# Patient Record
Sex: Male | Born: 1950
Health system: Southern US, Community
[De-identification: ages and names within clinical notes are randomized; demographics above are authoritative.]

## PROBLEM LIST (undated history)

## (undated) DIAGNOSIS — M1710 Unilateral primary osteoarthritis, unspecified knee: Secondary | ICD-10-CM

## (undated) DIAGNOSIS — M797 Fibromyalgia: Secondary | ICD-10-CM

## (undated) DIAGNOSIS — E119 Type 2 diabetes mellitus without complications: Secondary | ICD-10-CM

## (undated) DIAGNOSIS — L930 Discoid lupus erythematosus: Secondary | ICD-10-CM

## (undated) DIAGNOSIS — M1712 Unilateral primary osteoarthritis, left knee: Secondary | ICD-10-CM

## (undated) DIAGNOSIS — J309 Allergic rhinitis, unspecified: Secondary | ICD-10-CM

## (undated) DIAGNOSIS — F341 Dysthymic disorder: Secondary | ICD-10-CM

## (undated) DIAGNOSIS — M329 Systemic lupus erythematosus, unspecified: Secondary | ICD-10-CM

## (undated) DIAGNOSIS — K219 Gastro-esophageal reflux disease without esophagitis: Secondary | ICD-10-CM

## (undated) DIAGNOSIS — E559 Vitamin D deficiency, unspecified: Secondary | ICD-10-CM

## (undated) DIAGNOSIS — R7309 Other abnormal glucose: Secondary | ICD-10-CM

## (undated) DIAGNOSIS — N508 Other specified disorders of male genital organs: Secondary | ICD-10-CM

## (undated) DIAGNOSIS — F1010 Alcohol abuse, uncomplicated: Secondary | ICD-10-CM

## (undated) DIAGNOSIS — G4700 Insomnia, unspecified: Secondary | ICD-10-CM

## (undated) DIAGNOSIS — H9190 Unspecified hearing loss, unspecified ear: Secondary | ICD-10-CM

## (undated) DIAGNOSIS — I10 Essential (primary) hypertension: Secondary | ICD-10-CM

## (undated) DIAGNOSIS — N529 Male erectile dysfunction, unspecified: Secondary | ICD-10-CM

## (undated) DIAGNOSIS — IMO0002 Reserved for concepts with insufficient information to code with codable children: Secondary | ICD-10-CM

## (undated) HISTORY — DX: Insomnia, unspecified: G47.00

## (undated) HISTORY — DX: Dysthymic disorder: F34.1

## (undated) HISTORY — DX: Alcohol abuse, uncomplicated: F10.10

## (undated) HISTORY — DX: Unilateral primary osteoarthritis, left knee: M17.12

## (undated) HISTORY — DX: Fibromyalgia: M79.7

## (undated) HISTORY — PX: TOTAL SHOULDER ARTHROPLASTY: SHX126

## (undated) HISTORY — DX: Discoid lupus erythematosus: L93.0

## (undated) HISTORY — PX: CARPAL TUNNEL RELEASE: SHX101

## (undated) HISTORY — DX: Unspecified hearing loss, unspecified ear: H91.90

## (undated) HISTORY — PX: CHOLECYSTECTOMY: SHX55

## (undated) HISTORY — DX: Gastro-esophageal reflux disease without esophagitis: K21.9

## (undated) HISTORY — DX: Reserved for concepts with insufficient information to code with codable children: IMO0002

## (undated) HISTORY — DX: Other specified disorders of male genital organs: N50.8

## (undated) HISTORY — PX: KNEE ARTHROPLASTY: SHX992

## (undated) HISTORY — DX: Vitamin D deficiency, unspecified: E55.9

## (undated) HISTORY — DX: Allergic rhinitis, unspecified: J30.9

## (undated) HISTORY — DX: Unilateral primary osteoarthritis, unspecified knee: M17.10

## (undated) HISTORY — DX: Systemic lupus erythematosus, unspecified: M32.9

## (undated) HISTORY — DX: Essential (primary) hypertension: I10

## (undated) HISTORY — DX: Other abnormal glucose: R73.09

## (undated) HISTORY — DX: Male erectile dysfunction, unspecified: N52.9

---

## 1978-12-01 HISTORY — PX: GANGLION CYST EXCISION: SHX1691

## 1998-03-13 ENCOUNTER — Encounter: Admission: RE | Admit: 1998-03-13 | Discharge: 1998-03-13 | Payer: Self-pay | Admitting: Family Medicine

## 1998-05-02 ENCOUNTER — Encounter: Admission: RE | Admit: 1998-05-02 | Discharge: 1998-05-02 | Payer: Self-pay | Admitting: Family Medicine

## 1998-05-30 ENCOUNTER — Encounter: Admission: RE | Admit: 1998-05-30 | Discharge: 1998-05-30 | Payer: Self-pay | Admitting: Family Medicine

## 1998-06-25 ENCOUNTER — Encounter: Admission: RE | Admit: 1998-06-25 | Discharge: 1998-06-25 | Payer: Self-pay | Admitting: Sports Medicine

## 1998-07-18 ENCOUNTER — Encounter: Admission: RE | Admit: 1998-07-18 | Discharge: 1998-07-18 | Payer: Self-pay | Admitting: Family Medicine

## 1998-08-24 ENCOUNTER — Encounter: Admission: RE | Admit: 1998-08-24 | Discharge: 1998-08-24 | Payer: Self-pay | Admitting: Family Medicine

## 1999-03-11 ENCOUNTER — Encounter: Admission: RE | Admit: 1999-03-11 | Discharge: 1999-03-11 | Payer: Self-pay | Admitting: Family Medicine

## 1999-03-21 ENCOUNTER — Encounter: Admission: RE | Admit: 1999-03-21 | Discharge: 1999-03-21 | Payer: Self-pay | Admitting: Family Medicine

## 1999-04-12 ENCOUNTER — Encounter: Admission: RE | Admit: 1999-04-12 | Discharge: 1999-04-12 | Payer: Self-pay | Admitting: Family Medicine

## 1999-05-02 ENCOUNTER — Encounter: Admission: RE | Admit: 1999-05-02 | Discharge: 1999-05-02 | Payer: Self-pay | Admitting: Family Medicine

## 1999-08-12 ENCOUNTER — Encounter: Admission: RE | Admit: 1999-08-12 | Discharge: 1999-08-12 | Payer: Self-pay | Admitting: Family Medicine

## 2000-01-07 ENCOUNTER — Encounter: Admission: RE | Admit: 2000-01-07 | Discharge: 2000-01-07 | Payer: Self-pay | Admitting: Family Medicine

## 2000-03-31 ENCOUNTER — Encounter: Admission: RE | Admit: 2000-03-31 | Discharge: 2000-03-31 | Payer: Self-pay | Admitting: Sports Medicine

## 2000-04-14 ENCOUNTER — Encounter: Admission: RE | Admit: 2000-04-14 | Discharge: 2000-04-14 | Payer: Self-pay | Admitting: Sports Medicine

## 2000-05-11 ENCOUNTER — Ambulatory Visit (HOSPITAL_BASED_OUTPATIENT_CLINIC_OR_DEPARTMENT_OTHER): Admission: RE | Admit: 2000-05-11 | Discharge: 2000-05-11 | Payer: Self-pay | Admitting: Orthopedic Surgery

## 2000-07-01 ENCOUNTER — Ambulatory Visit (HOSPITAL_COMMUNITY): Admission: RE | Admit: 2000-07-01 | Discharge: 2000-07-01 | Payer: Self-pay | Admitting: Gastroenterology

## 2000-07-01 ENCOUNTER — Encounter (INDEPENDENT_AMBULATORY_CARE_PROVIDER_SITE_OTHER): Payer: Self-pay | Admitting: Specialist

## 2000-08-31 ENCOUNTER — Encounter: Admission: RE | Admit: 2000-08-31 | Discharge: 2000-08-31 | Payer: Self-pay | Admitting: Family Medicine

## 2001-04-23 ENCOUNTER — Encounter: Admission: RE | Admit: 2001-04-23 | Discharge: 2001-04-23 | Payer: Self-pay | Admitting: Family Medicine

## 2001-07-26 ENCOUNTER — Encounter: Admission: RE | Admit: 2001-07-26 | Discharge: 2001-07-26 | Payer: Self-pay | Admitting: Family Medicine

## 2001-07-26 ENCOUNTER — Ambulatory Visit (HOSPITAL_COMMUNITY): Admission: RE | Admit: 2001-07-26 | Discharge: 2001-07-26 | Payer: Self-pay | Admitting: Family Medicine

## 2001-08-11 ENCOUNTER — Encounter: Admission: RE | Admit: 2001-08-11 | Discharge: 2001-08-11 | Payer: Self-pay | Admitting: Family Medicine

## 2001-08-18 ENCOUNTER — Encounter: Admission: RE | Admit: 2001-08-18 | Discharge: 2001-08-18 | Payer: Self-pay | Admitting: Family Medicine

## 2001-10-25 ENCOUNTER — Encounter: Admission: RE | Admit: 2001-10-25 | Discharge: 2001-10-25 | Payer: Self-pay | Admitting: Family Medicine

## 2001-12-13 ENCOUNTER — Encounter: Admission: RE | Admit: 2001-12-13 | Discharge: 2001-12-13 | Payer: Self-pay | Admitting: Sports Medicine

## 2002-04-14 ENCOUNTER — Encounter: Admission: RE | Admit: 2002-04-14 | Discharge: 2002-04-14 | Payer: Self-pay | Admitting: Family Medicine

## 2002-04-26 ENCOUNTER — Encounter: Admission: RE | Admit: 2002-04-26 | Discharge: 2002-04-26 | Payer: Self-pay | Admitting: Family Medicine

## 2002-08-29 ENCOUNTER — Encounter: Admission: RE | Admit: 2002-08-29 | Discharge: 2002-08-29 | Payer: Self-pay | Admitting: Family Medicine

## 2002-09-26 ENCOUNTER — Encounter: Admission: RE | Admit: 2002-09-26 | Discharge: 2002-09-26 | Payer: Self-pay | Admitting: Sports Medicine

## 2003-07-19 ENCOUNTER — Encounter: Admission: RE | Admit: 2003-07-19 | Discharge: 2003-07-19 | Payer: Self-pay | Admitting: Family Medicine

## 2003-09-25 ENCOUNTER — Encounter: Admission: RE | Admit: 2003-09-25 | Discharge: 2003-09-25 | Payer: Self-pay | Admitting: Family Medicine

## 2003-10-05 ENCOUNTER — Encounter: Admission: RE | Admit: 2003-10-05 | Discharge: 2003-10-05 | Payer: Self-pay | Admitting: Sports Medicine

## 2003-10-06 ENCOUNTER — Encounter: Admission: RE | Admit: 2003-10-06 | Discharge: 2003-10-06 | Payer: Self-pay | Admitting: Sports Medicine

## 2003-10-09 ENCOUNTER — Encounter: Admission: RE | Admit: 2003-10-09 | Discharge: 2003-10-09 | Payer: Self-pay | Admitting: Family Medicine

## 2003-10-16 ENCOUNTER — Encounter: Admission: RE | Admit: 2003-10-16 | Discharge: 2003-10-16 | Payer: Self-pay | Admitting: Family Medicine

## 2003-11-01 ENCOUNTER — Encounter: Admission: RE | Admit: 2003-11-01 | Discharge: 2003-11-01 | Payer: Self-pay | Admitting: Family Medicine

## 2003-11-10 ENCOUNTER — Emergency Department (HOSPITAL_COMMUNITY): Admission: AD | Admit: 2003-11-10 | Discharge: 2003-11-10 | Payer: Self-pay | Admitting: Family Medicine

## 2003-11-22 ENCOUNTER — Encounter: Admission: RE | Admit: 2003-11-22 | Discharge: 2003-11-22 | Payer: Self-pay | Admitting: Sports Medicine

## 2003-11-27 ENCOUNTER — Encounter: Admission: RE | Admit: 2003-11-27 | Discharge: 2003-11-27 | Payer: Self-pay

## 2003-12-04 ENCOUNTER — Ambulatory Visit (HOSPITAL_COMMUNITY): Admission: RE | Admit: 2003-12-04 | Discharge: 2003-12-04 | Payer: Self-pay | Admitting: Family Medicine

## 2003-12-22 ENCOUNTER — Encounter: Admission: RE | Admit: 2003-12-22 | Discharge: 2003-12-22 | Payer: Self-pay | Admitting: Family Medicine

## 2004-01-19 ENCOUNTER — Encounter: Admission: RE | Admit: 2004-01-19 | Discharge: 2004-01-19 | Payer: Self-pay | Admitting: Family Medicine

## 2004-03-12 ENCOUNTER — Encounter: Admission: RE | Admit: 2004-03-12 | Discharge: 2004-03-12 | Payer: Self-pay | Admitting: Sports Medicine

## 2004-05-02 ENCOUNTER — Encounter: Admission: RE | Admit: 2004-05-02 | Discharge: 2004-05-02 | Payer: Self-pay | Admitting: Family Medicine

## 2004-05-21 ENCOUNTER — Encounter: Admission: RE | Admit: 2004-05-21 | Discharge: 2004-05-21 | Payer: Self-pay | Admitting: Family Medicine

## 2004-05-23 ENCOUNTER — Encounter: Admission: RE | Admit: 2004-05-23 | Discharge: 2004-05-23 | Payer: Self-pay | Admitting: Sports Medicine

## 2004-05-27 ENCOUNTER — Encounter: Admission: RE | Admit: 2004-05-27 | Discharge: 2004-05-27 | Payer: Self-pay | Admitting: Family Medicine

## 2005-08-15 ENCOUNTER — Ambulatory Visit (HOSPITAL_COMMUNITY): Admission: RE | Admit: 2005-08-15 | Discharge: 2005-08-15 | Payer: Self-pay | Admitting: Gastroenterology

## 2005-08-15 ENCOUNTER — Encounter (INDEPENDENT_AMBULATORY_CARE_PROVIDER_SITE_OTHER): Payer: Self-pay | Admitting: Specialist

## 2006-12-01 HISTORY — PX: KNEE ARTHROSCOPY: SUR90

## 2007-06-30 ENCOUNTER — Ambulatory Visit: Payer: Self-pay | Admitting: Family Medicine

## 2007-12-02 HISTORY — PX: OTHER SURGICAL HISTORY: SHX169

## 2008-02-11 ENCOUNTER — Encounter: Payer: Self-pay | Admitting: Rheumatology

## 2008-06-08 ENCOUNTER — Encounter: Payer: Self-pay | Admitting: Family Medicine

## 2008-06-08 ENCOUNTER — Ambulatory Visit: Payer: Self-pay | Admitting: Surgery

## 2008-06-08 ENCOUNTER — Ambulatory Visit: Admission: RE | Admit: 2008-06-08 | Discharge: 2008-06-08 | Payer: Self-pay | Admitting: Family Medicine

## 2008-10-28 ENCOUNTER — Ambulatory Visit (HOSPITAL_COMMUNITY): Admission: RE | Admit: 2008-10-28 | Discharge: 2008-10-28 | Payer: Self-pay | Admitting: Family Medicine

## 2008-10-28 ENCOUNTER — Ambulatory Visit: Payer: Self-pay | Admitting: *Deleted

## 2008-10-28 ENCOUNTER — Encounter: Payer: Self-pay | Admitting: Family Medicine

## 2008-10-31 HISTORY — PX: ROTATOR CUFF REPAIR: SHX139

## 2008-11-20 ENCOUNTER — Ambulatory Visit (HOSPITAL_BASED_OUTPATIENT_CLINIC_OR_DEPARTMENT_OTHER): Admission: RE | Admit: 2008-11-20 | Discharge: 2008-11-20 | Payer: Self-pay | Admitting: Orthopedic Surgery

## 2010-01-11 ENCOUNTER — Ambulatory Visit: Payer: Self-pay | Admitting: Family Medicine

## 2010-08-01 LAB — HM COLONOSCOPY: HM Colonoscopy: 3

## 2010-12-16 ENCOUNTER — Encounter
Admission: RE | Admit: 2010-12-16 | Discharge: 2010-12-16 | Payer: Self-pay | Source: Home / Self Care | Attending: Family Medicine | Admitting: Family Medicine

## 2011-04-15 NOTE — Op Note (Signed)
David Weaver, David Weaver                 ACCOUNT NO.:  [id number]   MEDICAL RECORD NO.:  [id number]          PATIENT TYPE:  AMB   LOCATION:  DSC                          FACILITY:  [facility]   PHYSICIAN:  Robert A. Thurston Hole, M.D. DATE OF BIRTH:  December 15, 1950   DATE OF PROCEDURE:  11/20/2008  DATE OF DISCHARGE:                               OPERATIVE REPORT   PREOPERATIVE DIAGNOSES:  1. Right shoulder complex rotator cuff tear.  2. Right shoulder partial labrum tear.   POSTOPERATIVE DIAGNOSES:  1. Right shoulder complex rotator cuff tear.  2. Right shoulder partial labrum tear.   PROCEDURE:  1. Right shoulder examination under anesthesia followed by      arthroscopically assisted complex rotator cuff repair using Arthrex      suture anchors x2 plus SwiveLock anchor x1 plus PushLock anchor x1.  2. Right shoulder debridement of partial labrum tear.   SURGEON:  Elana Alm. Thurston Hole, MD   ANESTHESIA:  General.   OPERATIVE TIME:  Two hours.   COMPLICATIONS:  None.   INDICATIONS FOR PROCEDURE:  David Weaver is a 60 year old gentleman who has  had significant right shoulder pain for the past 3-4 months, increasing  in nature with exam and MRI documenting a complex rotator cuff tear with  partial labrum tear.  He has failed conservative care and is now to  undergo arthroscopy and repair.   DESCRIPTION:  David Weaver was brought to the operating room on November 20, 2008, after interscalene block was placed in the holding area by  Anesthesia.  He was placed on the operative table in the supine  position.  He received Ancef 1 g IV preoperatively for prophylaxis.  After being placed under general anesthesia, his right shoulder was  examined.  He had full range of motion and his shoulder was stable to  ligamentous exam.  He was then placed in a beach-chair position and his  shoulder and arm was prepped using sterile DuraPrep and draped using  sterile technique.  Originally, through a posterior arthroscopic  portal,  the arthroscope with a pump attached was placed into an anterior portal  and an arthroscopic probe was placed.  On initial inspection, the  articular cartilage in the glenohumeral joint was found be intact.  He  had partial tearing of the anterosuperior and posterosuperior labrum 25-  30% which was debrided.  Anteroinferior labrum partial tearing 25% which  was debrided as well.  The anteroinferior glenohumeral ligament complex  was intact.  Biceps tendon anchor was hypermobile, but was otherwise  intact.  Biceps tendon was intact.  Rotator cuff showed a high-grade  partial tear of the subscapularis and an accessory lateral portal was  made and a mattress suture of 2-0 FiberWire was placed through this  subscap tear and then tied down arthroscopically and then it was further  reinforced with a PushLock anchor, just anterior to the bicipital groove  with a firm and tight fixation.  The supraspinatus and infraspinatus was  also found to be completely torn and detached and this was partially  debrided arthroscopically as well.  The  teres minor was intact.  Through  the accessory lateral portal, 2 separate Arthrex 5.5-mm suture anchors  were placed in the greater tuberosity.  Each of the sutures in this  anchor was passed arthroscopically through the rotator cuff tear and  then tied down with a firm and tight repair.  They were further  reinforced as well with a SwiveLock in the greater tuberosity with 6 of  these sutures from the repair.  Firm and tight repair was achieved.  The  shoulder could be brought through a full range of motion with no  impingement.  The patient had previously undergone a subacromial  decompression 8 years ago and did not need a revision of this as well as  a previous distal clavicle excision and this was not pathologic either.  At this point, it was felt that all pathology had been satisfactorily  addressed.  The instruments were removed.  Portals were  closed with 3-0  nylon suture.  Sterile dressings and abduction sling were applied.  The  patient was awakened and taken to the recovery room in stable condition.   FOLLOWUP CARE:  David Weaver will be followed as an outpatient on Percocet  and Robaxin.  He will be seen back in the office in a week for sutures  out and followup.      Robert A. Thurston Hole, M.D.  Electronically Signed     RAW/MEDQ  D:  11/20/2008  T:  11/21/2008  Job:  [id number]

## 2011-04-18 NOTE — Op Note (Signed)
[place]  Patient:    David Weaver, David Weaver                        MRN: [id number] Proc. Date: 05/11/00 Adm. Date:  [id number] Disc. Date: [id number] Attending:  Twana First                           Operative Report  PREOPERATIVE DIAGNOSIS:  Right shoulder rotator cuff tendonitis and impingement  with AC joint spurring.  POSTOPERATIVE DIAGNOSIS:  Right shoulder rotator cuff tendonitis and impingement with AC joint spurring.  PROCEDURE:  Right shoulder EUA followed by arthroscopic subacromial decompression. Right shoulder arthroscopically assisted distal clavicle excision.  SURGEON:  Elana Alm. Thurston Hole, M.D.  ASSISTANT:  Kirstin Adelberger, P.A.  ANESTHESIA:  General anesthesia.  OPERATIVE TIME:  40 minutes.  COMPLICATIONS:  None.  INDICATIONS:  [name] is a 60 year old gentleman who has had significant right  shoulder pain for the past seven to eight months increasing in nature with signs and symptoms consistent with rotator cuff tendonitis and impingement who has failed conservative care and is now to undergo arthroscopy.  DESCRIPTION OF PROCEDURE:  [name] was brought to the operating room on May 11, 2000, after a supraclavicular block had been placed in the holding room.  He was placed on the operative table in the supine position.  After an adequate level f general anesthesia was obtained, his right shoulder was examined.  He had a full range of motion and his shoulder was stable to ligamentous examination.  After his was done, he was placed in a beach chair position and his shoulder and arm was prepped using sterile Betadine and draped using sterile technique.  Originally,  through a posterior arthroscopic portal, the arthroscope with the pump attached was placed and through an anterior portal, an arthroscopic probe was placed.  On initial inspection, the articular cartilage and the glenohumeral joint was found to be  intact.  The anterior labrum was intact.  The superior labrum showed mild fibrillation, 20% was resected, but the biceps tendon anchor was intact.  The posterior labrum was intact.  Inferior labrum and anterior and inferior glenohumeral ligament complex was intact.  Biceps tendon was thoroughly inspected. There was no evidence of a tear.  Rotator cuff was thoroughly inspected.  There was some mild fraying, but no evidence of a tear.  Inferior capsular recess was free of pathology.  Subacromial space was entered and moderately thickened bursitis was  resected.  Underneath this, the rotator cuff was found to be moderately inflammed and thickened, but no evidence of a tear.  A subacromial decompression was carried out removing 6 to 8 mm of the undersurface of the anterior, anterior lateral, and anterior medial acromion that was resected with a 6 mm bur.  The CA ligament was also released and cauterized.  The distal clavicle AC joint was exposed. Significant degenerative changes were noted and the distal 5 to 6 mm was resected with a 6 mm bur and this was confirmed both arthroscopically and fluoroscopically. After this was done, the shoulder could be brought through a full range of motion with no impingement on the rotator cuff.  At this point, it was felt that all pathology had been satisfactorily addressed.  Instruments were removed. Portals were closed with 3-0 nylon suture and injected with 0.25% Marcaine with epinephrine.  Sterile dressings were applied and the patient awakened  and taken to the recovery room in stable condition.  FOLLOW-UP:  [name] will be followed as an outpatient on Talwin NX and Naprosyn. I will see him back in the office in a week for sutures out and follow-up. Begin early physical therapy for range of motion followed by strengthening. DD:  05/11/00 TD:  05/13/00 Job: [id number]

## 2011-04-18 NOTE — Procedures (Signed)
[facility]  Patient:    David Weaver, David Weaver                        MRN: [id number] Proc. Date: 07/01/00 Adm. Date:  [id number] Disc. Date: [id number] Attending:  Charna Elizabeth CC:         Jamey Reas, M.D.   Procedure Report  DATE OF BIRTH:  08-Oct-1951  REFERRING PHYSICIAN:  Jamey Reas, M.D.  PROCEDURE PERFORMED:  Colonoscopy with hot biopsy x 1.  ENDOSCOPIST:  Anselmo Rod, M.D.  INSTRUMENT USED:  Olympus video colonoscope.  INDICATIONS:  A 60 year old white male with a history of colon cancer in his brother who was diagnosed at 31.  Rule out colonic polyps, masses, hemorrhoids, etc.  PREPROCEDURE PREPARATION:  Informed consent was procured from the patient. The patient was fasted for eight hours prior to the procedure and prepped with a bottle of magnesium citrate and a gallon of Nulytely the night prior to the procedure.  PREPROCEDURE PHYSICAL:  VITAL SIGNS:  The patient had stable vital signs.  NECK:  Supple.  CHEST:  Chest clear to auscultation.  CARDIAC:  S1 and S2 regular.  ABDOMEN:  Abdomen soft with normal abdominal bowel sounds.  DESCRIPTION OF PROCEDURE:  The patient was placed in the left lateral decubitus position and sedated with 60 mg of Demerol and 5 mg of Versed intravenously.  Once the patient was adequately sedated and maintained on low-flow oxygen and continuous cardiac monitoring, the Olympus video colonoscope was advanced from the rectum to the cecum without difficulty. There was a small amount of residual stool in the colon.  A small sessile polyp was removed from the distal right colon.  The patient also had small nonbleeding internal hemorrhoids on retroflexion.  The rest of the colonic mucosa appeared healthy without abnormalities.  IMPRESSION: 1. Small nonbleeding internal hemorrhoid. 2. One small polyp biopsied from the distal right colon,    otherwise normal colonoscopy after the  rectum.  RECOMMENDATIONS: 1. Await pathology results. 2. Repeat colonoscopy in the next five years or earlier if need    be (if the patient develops any symptoms in the interim). DD:  07/01/00 TD:  07/02/00 Job: [id number]

## 2011-09-04 LAB — BASIC METABOLIC PANEL
BUN: 10 mg/dL (ref 6–23)
CO2: 23 mEq/L (ref 19–32)
Calcium: 8.8 mg/dL (ref 8.4–10.5)
Chloride: 102 mEq/L (ref 96–112)
Creatinine, Ser: 0.55 mg/dL (ref 0.4–1.5)
GFR calc Af Amer: >60 mL/min (ref >60)
GFR calc non Af Amer: >60 mL/min (ref >60)
Glucose, Bld: 98 mg/dL (ref 70–99)
Potassium: 4.5 mEq/L (ref 3.5–5.1)
Sodium: 135 mEq/L (ref 135–145)

## 2012-01-13 LAB — PULMONARY FUNCTION TEST

## 2012-09-01 ENCOUNTER — Other Ambulatory Visit: Payer: Self-pay

## 2012-10-26 ENCOUNTER — Encounter: Payer: Self-pay | Admitting: *Deleted

## 2012-11-01 ENCOUNTER — Ambulatory Visit: Payer: BC Managed Care – PPO

## 2012-11-01 ENCOUNTER — Ambulatory Visit (INDEPENDENT_AMBULATORY_CARE_PROVIDER_SITE_OTHER): Payer: BC Managed Care – PPO | Admitting: Family Medicine

## 2012-11-01 ENCOUNTER — Encounter: Payer: Self-pay | Admitting: Family Medicine

## 2012-11-01 VITALS — BP 118/82 | HR 102 | Temp 98.0°F | Resp 16 | Ht 66.5 in | Wt 183.0 lb

## 2012-11-01 DIAGNOSIS — E559 Vitamin D deficiency, unspecified: Secondary | ICD-10-CM

## 2012-11-01 DIAGNOSIS — Z01818 Encounter for other preprocedural examination: Secondary | ICD-10-CM

## 2012-11-01 DIAGNOSIS — G4700 Insomnia, unspecified: Secondary | ICD-10-CM | POA: Insufficient documentation

## 2012-11-01 DIAGNOSIS — R7309 Other abnormal glucose: Secondary | ICD-10-CM | POA: Insufficient documentation

## 2012-11-01 DIAGNOSIS — F329 Major depressive disorder, single episode, unspecified: Secondary | ICD-10-CM

## 2012-11-01 DIAGNOSIS — Z23 Encounter for immunization: Secondary | ICD-10-CM | POA: Insufficient documentation

## 2012-11-01 DIAGNOSIS — I10 Essential (primary) hypertension: Secondary | ICD-10-CM

## 2012-11-01 LAB — CBC WITH DIFFERENTIAL/PLATELET
Basophils Absolute: 0.1 10*3/uL (ref 0.0–0.1)
Basophils Relative: 1 % (ref 0–1)
Eosinophils Absolute: 0.2 10*3/uL (ref 0.0–0.7)
Eosinophils Relative: 4 % (ref 0–5)
HCT: 47.3 % (ref 39.0–52.0)
Hemoglobin: 16.7 g/dL (ref 13.0–17.0)
Lymphocytes Relative: 19 % (ref 12–46)
Lymphs Abs: 1 10*3/uL (ref 0.7–4.0)
MCH: 29.2 pg (ref 26.0–34.0)
MCHC: 35.3 g/dL (ref 30.0–36.0)
MCV: 82.8 fL (ref 78.0–100.0)
Monocytes Absolute: 0.8 10*3/uL (ref 0.1–1.0)
Monocytes Relative: 14 % — ABNORMAL HIGH (ref 3–12)
Neutro Abs: 3.4 10*3/uL (ref 1.7–7.7)
Neutrophils Relative %: 62 % (ref 43–77)
Platelets: 299 10*3/uL (ref 150–400)
RBC: 5.71 MIL/uL (ref 4.22–5.81)
RDW: 14.8 % (ref 11.5–15.5)
WBC: 5.5 10*3/uL (ref 4.0–10.5)

## 2012-11-01 LAB — HEMOGLOBIN A1C
Hgb A1c MFr Bld: 6.2 % — ABNORMAL HIGH (ref <5.7)
Mean Plasma Glucose: 131 mg/dL — ABNORMAL HIGH (ref <117)

## 2012-11-01 NOTE — Assessment & Plan Note (Signed)
Controlled no change in management. 

## 2012-11-01 NOTE — Assessment & Plan Note (Signed)
Improved with decreased caffeine intake in the evenings.

## 2012-11-01 NOTE — Progress Notes (Signed)
[address]   [place], [place]  [address]   [phone]  Subjective:    Patient ID: David Weaver, male    DOB: 07-22-1951, 61 y.o.   MRN: [id number]  HPIThis 61 y.o. male presents for evaluation of four month follow-up and for preoperative clearance:  1.  OA knees: scheduled for TKR R in 12/27/12.  Will eventually have L TKR.  Plans to perform L TKR four weeks later.  Will be out of work x 3 months.  No chest pain, palpitations, no SOB.  Does suffer with DOE with walking to building and back which is 50-60 yards.  Walking up steps without difficulty; no dyspnea on exertion with walking up steps.  No swelling in legs.  No excessive fatigue.  No orthopnea.  Not smoking.  Drinking 4-5 nights per week.  2.  Insomnia: quit drinking tea at night.  Going to sleep at 11:00pm; wakes up at 7:30am.  No sleep aides.  Tried Melatonin but caused night sweats.  Intolerant to Trazodone.  Drinks caffeine at lunch.    3.  HTN:  Not checking blood pressure at home.  Four month follow-up.  No changes to management made at last visit.  Reports good compliance with medication; good tolerance to medication; good symptom control.    4.  Depression:  Stable.  No SI.  Retirement unknown.  No major stressors at home or at work.  Compliance with medication; good tolerance to medication; good symptom control.  5. Discoid Lupus:   Followed every six months at Dr. Nada Maclachlan office; usually sees the PA-C.  No recent issues.  6. Fibromyalgia:  Stable.  Taking one Percocet daily; waits until 10:00am.    7.  Vitamin D deficiency: taking 1000 units daily.  Also taking Vitamin D 50,000 weekly.      Review of Systems  Constitutional: Negative for fever, chills, diaphoresis and fatigue.  HENT: Negative for nosebleeds, congestion and rhinorrhea.   Respiratory: Negative for apnea, cough, choking, chest tightness, shortness of breath, wheezing and stridor.   Cardiovascular: Negative for chest pain, palpitations and leg  swelling.  Gastrointestinal: Negative for nausea, vomiting, abdominal pain, diarrhea and constipation.  Musculoskeletal: Positive for myalgias, back pain, joint swelling and arthralgias.  Neurological: Negative for dizziness, syncope, facial asymmetry, speech difficulty, weakness, light-headedness, numbness and headaches.  Psychiatric/Behavioral: Negative for suicidal ideas, sleep disturbance, self-injury and dysphoric mood. The patient is not nervous/anxious.         Past Medical History  Diagnosis Date  . Other abnormal glucose   . Allergic rhinitis, cause unspecified   . Osteoarthrosis, unspecified whether generalized or localized, lower leg   . Other specified disorder of male genital organs   . Other malaise and fatigue   . Tobacco use disorder   . Alcohol abuse, daily use   . Essential hypertension, benign   . Dysthymic disorder   . Esophageal reflux   . Impotence of organic origin   . Lupus erythematosus   . Unspecified hearing loss   . Insomnia, unspecified   . Unspecified vitamin D deficiency   . Lupus     Past Surgical History  Procedure Date  . Cholecystectomy   . Carpal tunnel release     bilateral  . Ganglion cyst excision 1980    Left wrist  . Knee arthroscopy 2008    Right  . Lumbar spine epidural 2009    [facility]  . Rotator cuff repair 10/2008    right  Prior to Admission medications   Medication Sig Start Date End Date Taking? Authorizing Provider  amLODipine (NORVASC) 10 MG tablet Take 10 mg by mouth daily.   Yes Historical Provider, MD  aspirin 81 MG chewable tablet Chew 81 mg by mouth daily.   Yes Historical Provider, MD  DULoxetine (CYMBALTA) 60 MG capsule Take 60 mg by mouth 2 (two) times daily.   Yes Historical Provider, MD  hydroxychloroquine (PLAQUENIL) 200 MG tablet Take 200 mg by mouth 2 (two) times daily.   Yes Historical Provider, MD  omeprazole (PRILOSEC) 40 MG capsule Take 40 mg by mouth daily.   Yes Historical Provider,  MD  oxyCODONE-acetaminophen (PERCOCET/ROXICET) 5-325 MG per tablet Take 1 tablet by mouth daily.   Yes Historical Provider, MD  sucralfate (CARAFATE) 1 G tablet Take 1 g by mouth 4 (four) times daily.   Yes Historical Provider, MD  tadalafil (CIALIS) 20 MG tablet Take 20 mg by mouth daily as needed.   Yes Historical Provider, MD  Vitamin D, Ergocalciferol, (DRISDOL) 50000 UNITS CAPS Take 50,000 Units by mouth every 7 (seven) days.   Yes Historical Provider, MD  pilocarpine (SALAGEN) 5 MG tablet Take 5 mg by mouth 3 (three) times daily.    Historical Provider, MD  traZODone (DESYREL) 50 MG tablet Take 50 mg by mouth at bedtime.    Historical Provider, MD    No Known Allergies  History   Social History  . Marital Status: Married    Spouse Name: N/A    Number of Children: 1  . Years of Education: N/A   Occupational History  . [employer]     [employer]s cars/trucks   Social History Main Topics  . Smoking status: Former Smoker -- 2.0 packs/day for 30 years    Types: Cigarettes  . Smokeless tobacco: Not on file     Comment: quit 12 /2011  . Alcohol Use: Yes     Comment: 2 drinks of liquor, every afternoon  . Drug Use: No  . Sexually Active: Not on file   Other Topics Concern  . Not on file   Social History Narrative   Guns in the home stored in locked cabinet. Caffeine use: 1 serving/ day. Exercise: Light due to arthritis pain, walks as much as he can tolerate. Married x 16 years, third marriage, happily married.    Family History  Problem Relation Age of Onset  . Alzheimer's disease Mother   . Osteoporosis Mother   . Heart disease Father   . Alcohol abuse Father   . Cancer Brother     colon  . Diabetes Brother     Objective:   Physical Exam  Nursing note and vitals reviewed. Constitutional: He is oriented to person, place, and time. He appears well-developed and well-nourished. No distress.  HENT:  Head: Normocephalic and atraumatic.  Mouth/Throat: Oropharynx is clear  and moist.  Eyes: Conjunctivae normal are normal. Pupils are equal, round, and reactive to light.  Neck: Normal range of motion. Neck supple. No thyromegaly present.  Cardiovascular: Normal rate, regular rhythm, normal heart sounds and intact distal pulses.  Exam reveals no gallop and no friction rub.   No murmur heard. Pulmonary/Chest: Effort normal and breath sounds normal. He has no wheezes. He has no rales.  Abdominal: Soft. Bowel sounds are normal. There is no tenderness. There is no rebound and no guarding.  Lymphadenopathy:    He has no cervical adenopathy.  Neurological: He is alert and oriented to person, place,  and time. No cranial nerve deficit. He exhibits normal muscle tone. Coordination normal.  Skin: Skin is warm and dry. No rash noted. He is not diaphoretic. No pallor.  Psychiatric: He has a normal mood and affect. His behavior is normal. Judgment and thought content normal.   INFLUENZA VACCINE ADMINISTERED.  EKG:    [facility] reading (PRIMARY) by  Dr. Katrinka Blazing.  CXR: NAD     Assessment & Plan:   1. Pre-operative clearance  EKG 12-Lead, Spirometry with graph, Spirometry with graph, DG Chest 2 View  2. Essential hypertension, benign  CBC with Differential, Comprehensive metabolic panel  3. Other abnormal glucose  Hemoglobin A1c  4. Depression    5. Need for prophylactic vaccination and inoculation against influenza  Flu vaccine greater than or equal to 3yo preservative free IM  6. Vitamin D deficiency  Vitamin D 25 hydroxy

## 2012-11-01 NOTE — Assessment & Plan Note (Signed)
New. Scheduled for R total knee replacement on 12/28/11 by Thurston Hole. Asymptomatic from cardio-respiratory standpoint.  EKG stable; CXR normal; spirometry stable.  Obtain labs.

## 2012-11-01 NOTE — Patient Instructions (Addendum)
1. Pre-operative clearance  EKG 12-Lead, Spirometry with graph, Spirometry with graph, DG Chest 2 View  2. Essential hypertension, benign  CBC with Differential, Comprehensive metabolic panel  3. Other abnormal glucose  Hemoglobin A1c  4. Depression    5. Need for prophylactic vaccination and inoculation against influenza  Flu vaccine greater than or equal to 63yo preservative free IM  6. Vitamin D deficiency  Vitamin D 25 hydroxy

## 2012-11-01 NOTE — Assessment & Plan Note (Signed)
Stable; obtain labs; continue with dietary modification, attempts at weight loss.

## 2012-11-01 NOTE — Assessment & Plan Note (Signed)
Controlled; no changes to management.  Obtain labs.

## 2012-11-01 NOTE — Assessment & Plan Note (Signed)
Uncontrolled; obtain labs; continue current dose of supplementation.

## 2012-11-01 NOTE — Assessment & Plan Note (Signed)
Administered in office.

## 2012-11-02 ENCOUNTER — Telehealth: Payer: Self-pay

## 2012-11-02 LAB — COMPREHENSIVE METABOLIC PANEL
ALT: 34 U/L (ref 0–53)
AST: 25 U/L (ref 0–37)
Albumin: 4.6 g/dL (ref 3.5–5.2)
Alkaline Phosphatase: 64 U/L (ref 39–117)
BUN: 21 mg/dL (ref 6–23)
CO2: 23 mEq/L (ref 19–32)
Calcium: 9.9 mg/dL (ref 8.4–10.5)
Chloride: 106 mEq/L (ref 96–112)
Creat: 0.78 mg/dL (ref 0.50–1.35)
Glucose, Bld: 104 mg/dL — ABNORMAL HIGH (ref 70–99)
Potassium: 4 mEq/L (ref 3.5–5.3)
Sodium: 139 mEq/L (ref 135–145)
Total Bilirubin: 0.7 mg/dL (ref 0.3–1.2)
Total Protein: 7.1 g/dL (ref 6.0–8.3)

## 2012-11-02 LAB — VITAMIN D 25 HYDROXY (VIT D DEFICIENCY, FRACTURES): Vit D, 25-Hydroxy: 40 ng/mL (ref 30–89)

## 2012-11-02 NOTE — Telephone Encounter (Signed)
See labs 

## 2012-11-02 NOTE — Telephone Encounter (Signed)
Pt states he had a missed call from our office, wants to know if its regarding his labs Please call pt to advsie

## 2012-12-13 ENCOUNTER — Encounter (HOSPITAL_COMMUNITY): Payer: Self-pay | Admitting: Respiratory Therapy

## 2012-12-15 ENCOUNTER — Other Ambulatory Visit: Payer: Self-pay | Admitting: Physician Assistant

## 2012-12-15 ENCOUNTER — Encounter: Payer: Self-pay | Admitting: Physician Assistant

## 2012-12-15 DIAGNOSIS — M19041 Primary osteoarthritis, right hand: Secondary | ICD-10-CM | POA: Insufficient documentation

## 2012-12-15 DIAGNOSIS — M1711 Unilateral primary osteoarthritis, right knee: Secondary | ICD-10-CM | POA: Insufficient documentation

## 2012-12-15 DIAGNOSIS — G4700 Insomnia, unspecified: Secondary | ICD-10-CM | POA: Insufficient documentation

## 2012-12-15 DIAGNOSIS — K219 Gastro-esophageal reflux disease without esophagitis: Secondary | ICD-10-CM | POA: Insufficient documentation

## 2012-12-15 DIAGNOSIS — H9190 Unspecified hearing loss, unspecified ear: Secondary | ICD-10-CM | POA: Insufficient documentation

## 2012-12-15 DIAGNOSIS — E559 Vitamin D deficiency, unspecified: Secondary | ICD-10-CM | POA: Insufficient documentation

## 2012-12-15 DIAGNOSIS — F1010 Alcohol abuse, uncomplicated: Secondary | ICD-10-CM | POA: Insufficient documentation

## 2012-12-15 DIAGNOSIS — M359 Systemic involvement of connective tissue, unspecified: Secondary | ICD-10-CM | POA: Insufficient documentation

## 2012-12-15 DIAGNOSIS — L930 Discoid lupus erythematosus: Secondary | ICD-10-CM

## 2012-12-15 DIAGNOSIS — M1710 Unilateral primary osteoarthritis, unspecified knee: Secondary | ICD-10-CM

## 2012-12-15 DIAGNOSIS — F17200 Nicotine dependence, unspecified, uncomplicated: Secondary | ICD-10-CM | POA: Insufficient documentation

## 2012-12-15 NOTE — H&P (Signed)
TOTAL KNEE ADMISSION H&P  Patient is being admitted for right total knee arthroplasty.  Subjective:  Chief Complaint:right knee pain.  HPI: David Weaver, 61 y.o. male, has a history of pain and functional disability in the right knee due to arthritis and has failed non-surgical conservative treatments for greater than 12 weeks to includeNSAID's and/or analgesics, corticosteriod injections, flexibility and strengthening excercises, supervised PT with diminished ADL's post treatment, use of assistive devices, weight reduction as appropriate and activity modification.  Onset of symptoms was gradual, starting 5 years ago with gradually worsening course since that time. The patient noted prior procedures on the knee to include  arthroscopy and menisectomy on the right knee(s).  Patient currently rates pain in the right knee(s) at 8 out of 10 with activity. Patient has night pain, worsening of pain with activity and weight bearing, pain that interferes with activities of daily living, crepitus and joint swelling.  Patient has evidence of subchondral sclerosis, periarticular osteophytes and joint space narrowing by imaging studies. There is no active infection.  Patient Active Problem List   Diagnosis Date Noted  . Osteoarthrosis, unspecified whether generalized or localized, lower leg   . Tobacco use disorder   . Alcohol abuse, daily use   . Esophageal reflux   . Lupus erythematosus   . Unspecified hearing loss   . Insomnia, unspecified   . Right knee DJD   . Unspecified vitamin D deficiency   . Essential hypertension, benign 11/01/2012  . Other abnormal glucose 11/01/2012  . Pre-operative clearance 11/01/2012  . Depression 11/01/2012  . Need for prophylactic vaccination and inoculation against influenza 11/01/2012  . Vitamin D deficiency 11/01/2012  . Insomnia 11/01/2012   Past Medical History  Diagnosis Date  . Other abnormal glucose   . Allergic rhinitis, cause unspecified   .  Osteoarthrosis, unspecified whether generalized or localized, lower leg   . Other specified disorder of male genital organs   . Other malaise and fatigue   . Tobacco use disorder     quit smokeing 2012  . Alcohol abuse, daily use   . Essential hypertension, benign   . Dysthymic disorder   . Esophageal reflux   . Impotence of organic origin   . Lupus erythematosus   . Unspecified hearing loss   . Insomnia, unspecified   . Unspecified vitamin D deficiency   . Lupus   . Right knee DJD     Past Surgical History  Procedure Date  . Cholecystectomy   . Carpal tunnel release     bilateral  . Ganglion cyst excision 1980    Left wrist  . Knee arthroscopy 2008    Right  . Lumbar spine epidural 2009    [facility]  . Rotator cuff repair 10/2008    right     (Not in a hospital admission) No Known Allergies   Current Outpatient Prescriptions on File Prior to Visit  Medication Sig Dispense Refill  . amLODipine (NORVASC) 10 MG tablet Take 10 mg by mouth daily.      . aspirin 81 MG chewable tablet Chew 81 mg by mouth daily.      . DULoxetine (CYMBALTA) 60 MG capsule Take 60 mg by mouth 2 (two) times daily.      . hydroxychloroquine (PLAQUENIL) 200 MG tablet Take 200 mg by mouth 2 (two) times daily.      . omeprazole (PRILOSEC) 40 MG capsule Take 40 mg by mouth daily.      . oxyCODONE-acetaminophen (  PERCOCET/ROXICET) 5-325 MG per tablet Take 1 tablet by mouth daily.      . Vitamin D, Ergocalciferol, (DRISDOL) 50000 UNITS CAPS Take 50,000 Units by mouth every 7 (seven) days.      . tadalafil (CIALIS) 20 MG tablet Take 20 mg by mouth daily as needed.        History  Substance Use Topics  . Smoking status: Former Smoker -- 2.0 packs/day for 30 years    Types: Cigarettes  . Smokeless tobacco: Not on file     Comment: quit 12 /2011  . Alcohol Use: Yes     Comment: 2 drinks of liquor, every afternoon    Family History  Problem Relation Age of Onset  . Alzheimer's disease  Mother   . Osteoporosis Mother   . Heart disease Father   . Alcohol abuse Father   . Cancer Brother     colon  . Diabetes Brother      Review of Systems  Constitutional: Negative.   HENT: Negative.   Eyes: Negative.   Respiratory: Negative.   Cardiovascular: Negative.   Gastrointestinal: Negative.   Genitourinary: Negative.   Musculoskeletal: Positive for joint pain.  Skin: Negative.   Neurological: Negative.   Endo/Heme/Allergies: Negative.   Psychiatric/Behavioral: Negative.     Objective:  Physical Exam  Constitutional: He is oriented to person, place, and time. He appears well-developed and well-nourished.  HENT:  Head: Normocephalic and atraumatic.  Eyes: EOM are normal. Pupils are equal, round, and reactive to light.  Neck: Neck supple.  Cardiovascular: Normal rate and regular rhythm.   Respiratory: Effort normal.  GI: Soft. Bowel sounds are normal.  Genitourinary:       Not pertinent to current symptomatology therefore not examined.  Musculoskeletal:       Examination of both knees reveals moderate varus deformity bilaterally, diffuse pain, 1+ crepitation 1+ synovitis range of motion -5 to 125 degrees both knees are stable with normal patella tracking. Gait exam reveals significant antalgic gait with pain in both knees. Vascular exam: pulses 2+ and symmetric.  Neurological: He is alert and oriented to person, place, and time.  Skin: Skin is warm and dry.  Psychiatric: He has a normal mood and affect. His behavior is normal.    Vital signs in last 24 hours: Last recorded: 01/15 1300   BP: 132/84 Pulse: 88  Temp: 98.7 F (37.1 C)    Height: 5' 9" (1.753 m) SpO2: 92  Weight: 83.008 kg (183 lb)     Labs:   Estimated Body mass index is 27.02 kg/(m^2) as calculated from the following:   Height as of this encounter: 5' 9"(1.753 m).   Weight as of this encounter: 183 lb(83.008 kg).   Imaging Review Plain radiographs demonstrate severe degenerative joint  disease of the bilaterally knee(s). The overall alignment issignificant varus. The bone quality appears to be good for age and reported activity level.  Assessment/Plan:  End stage arthritis, right knee  Patient Active Problem List  Diagnosis  . Essential hypertension, benign  . Other abnormal glucose  . Pre-operative clearance  . Depression  . Need for prophylactic vaccination and inoculation against influenza  . Vitamin D deficiency  . Insomnia  . Osteoarthrosis, unspecified whether generalized or localized, lower leg  . Tobacco use disorder  . Alcohol abuse, daily use  . Esophageal reflux  . Lupus erythematosus  . Unspecified hearing loss  . Insomnia, unspecified  . Right knee DJD  . Unspecified vitamin D deficiency      The patient history, physical examination, clinical judgment of the provider and imaging studies are consistent with end stage degenerative joint disease of the right knee(s) and total knee arthroplasty is deemed medically necessary. The treatment options including medical management, injection therapy arthroscopy and arthroplasty were discussed at length. The risks and benefits of total knee arthroplasty were presented and reviewed. The risks due to aseptic loosening, infection, stiffness, patella tracking problems, thromboembolic complications and other imponderables were discussed. The patient acknowledged the explanation, agreed to proceed with the plan and consent was signed. Patient is being admitted for inpatient treatment for surgery, pain control, PT, OT, prophylactic antibiotics, VTE prophylaxis, progressive ambulation and ADL's and discharge planning. The patient is planning to be discharged home with home health services   Sayid Moll A. Jaelle Campanile, PA-C Physician Assistant [facility] [phone]  12/15/2012, 2:55 PM  

## 2012-12-20 ENCOUNTER — Encounter (HOSPITAL_COMMUNITY)
Admission: RE | Admit: 2012-12-20 | Discharge: 2012-12-20 | Disposition: A | Discharge status: Home / Self Care | Payer: BC Managed Care – PPO | Source: Ambulatory Visit | Attending: Physician Assistant | Admitting: Physician Assistant

## 2012-12-20 ENCOUNTER — Encounter (HOSPITAL_COMMUNITY)
Admission: RE | Admit: 2012-12-20 | Discharge: 2012-12-20 | Disposition: A | Discharge status: Home / Self Care | Payer: BC Managed Care – PPO | Source: Ambulatory Visit | Attending: Orthopedic Surgery | Admitting: Orthopedic Surgery

## 2012-12-20 ENCOUNTER — Encounter (HOSPITAL_COMMUNITY): Payer: Self-pay

## 2012-12-20 LAB — CBC WITH DIFFERENTIAL/PLATELET
Basophils Absolute: 0.1 10*3/uL (ref 0.0–0.1)
Basophils Relative: 1 % (ref 0–1)
Hemoglobin: 16.8 g/dL (ref 13.0–17.0)
Lymphocytes Relative: 25 % (ref 12–46)
MCHC: 35.1 g/dL (ref 30.0–36.0)
Monocytes Relative: 11 % (ref 3–12)
Neutro Abs: 3.3 10*3/uL (ref 1.7–7.7)
Neutrophils Relative %: 59 % (ref 43–77)
WBC: 5.6 10*3/uL (ref 4.0–10.5)

## 2012-12-20 LAB — COMPREHENSIVE METABOLIC PANEL
AST: 22 U/L (ref 0–37)
Albumin: 4.4 g/dL (ref 3.5–5.2)
Alkaline Phosphatase: 77 U/L (ref 39–117)
BUN: 13 mg/dL (ref 6–23)
CO2: 21 mEq/L (ref 19–32)
Chloride: 102 mEq/L (ref 96–112)
Potassium: 3.7 mEq/L (ref 3.5–5.1)
Total Bilirubin: 0.5 mg/dL (ref 0.3–1.2)

## 2012-12-20 LAB — URINALYSIS, ROUTINE W REFLEX MICROSCOPIC
Leukocytes, UA: NEGATIVE
Nitrite: NEGATIVE
Specific Gravity, Urine: 1.024 (ref 1.005–1.030)
pH: 7 (ref 5.0–8.0)

## 2012-12-20 LAB — TYPE AND SCREEN

## 2012-12-20 LAB — SURGICAL PCR SCREEN: MRSA, PCR: NEGATIVE

## 2012-12-20 LAB — APTT: aPTT: 30 seconds (ref 24–37)

## 2012-12-20 NOTE — Pre-Procedure Instructions (Signed)
David Weaver  12/20/2012   Your procedure is scheduled on:  Monday, January 27  Report to [facility] at 1015 AM.  Call this number if you have problems the morning of surgery: [phone]   Remember:   Do not eat food or drink liquids after midnight.   Take these medicines the morning of surgery with A SIP OF WATER: Norvasc,Cymbalta,hydroxychloroquine,omeprazole,oxycodone,   Do not wear jewelry, make-up or nail polish.  Do not wear lotions, powders, or perfumes. You may wear deodorant.  Do not shave 48 hours prior to surgery. Men may shave face and neck.  Do not bring valuables to the hospital.  Contacts, dentures or bridgework may not be worn into surgery.  Leave suitcase in the car. After surgery it may be brought to your room.  For patients admitted to the hospital, checkout time is 11:00 AM the day of  discharge.       Special Instructions: Incentive Spirometry - Practice and bring it with you on the day of surgery. Shower using CHG 2 nights before surgery and the night before surgery.  If you shower the day of surgery use CHG.  Use special wash - you have one bottle of CHG for all showers.  You should use approximately 1/3 of the bottle for each shower.   Please read over the following fact sheets that you were given: Pain Booklet, Coughing and Deep Breathing, Blood Transfusion Information, Total Joint Packet, MRSA Information and Surgical Site Infection Prevention

## 2012-12-20 NOTE — Progress Notes (Signed)
No office note in patent chart @ [facility]. Stress test report faxed and placed  in chart.

## 2012-12-21 LAB — URINE CULTURE

## 2012-12-24 NOTE — Progress Notes (Signed)
LEFT VOICE MESSAGE FOR PATIENT TO ARRIVE 12/27/12 AT 900 AM.

## 2012-12-26 MED ORDER — CEFAZOLIN SODIUM-DEXTROSE 2-3 GM-% IV SOLR
2.0000 g | INTRAVENOUS | Status: AC
  Administered 2012-12-27: 2 g via INTRAVENOUS
  Filled 2012-12-26: qty 50

## 2012-12-27 ENCOUNTER — Encounter (HOSPITAL_COMMUNITY)
Admission: RE | Disposition: A | Discharge status: Home Health | Payer: Self-pay | Source: Ambulatory Visit | Attending: Orthopedic Surgery

## 2012-12-27 ENCOUNTER — Encounter (HOSPITAL_COMMUNITY): Payer: Self-pay | Admitting: Anesthesiology

## 2012-12-27 ENCOUNTER — Ambulatory Visit (HOSPITAL_COMMUNITY): Payer: BC Managed Care – PPO | Admitting: Anesthesiology

## 2012-12-27 ENCOUNTER — Encounter (HOSPITAL_COMMUNITY): Payer: Self-pay | Admitting: *Deleted

## 2012-12-27 ENCOUNTER — Inpatient Hospital Stay (HOSPITAL_COMMUNITY)
Admission: RE | Admit: 2012-12-27 | Discharge: 2012-12-28 | DRG: 209 | Disposition: A | Discharge status: Home Health | Payer: BC Managed Care – PPO | Source: Ambulatory Visit | Attending: Orthopedic Surgery | Admitting: Orthopedic Surgery

## 2012-12-27 DIAGNOSIS — M329 Systemic lupus erythematosus, unspecified: Secondary | ICD-10-CM | POA: Diagnosis present

## 2012-12-27 DIAGNOSIS — M1711 Unilateral primary osteoarthritis, right knee: Secondary | ICD-10-CM | POA: Diagnosis present

## 2012-12-27 DIAGNOSIS — F3289 Other specified depressive episodes: Secondary | ICD-10-CM | POA: Diagnosis present

## 2012-12-27 DIAGNOSIS — M19041 Primary osteoarthritis, right hand: Secondary | ICD-10-CM | POA: Diagnosis present

## 2012-12-27 DIAGNOSIS — F17200 Nicotine dependence, unspecified, uncomplicated: Secondary | ICD-10-CM | POA: Diagnosis present

## 2012-12-27 DIAGNOSIS — Z87891 Personal history of nicotine dependence: Secondary | ICD-10-CM

## 2012-12-27 DIAGNOSIS — Z7982 Long term (current) use of aspirin: Secondary | ICD-10-CM

## 2012-12-27 DIAGNOSIS — F32A Depression, unspecified: Secondary | ICD-10-CM | POA: Diagnosis present

## 2012-12-27 DIAGNOSIS — H9190 Unspecified hearing loss, unspecified ear: Secondary | ICD-10-CM | POA: Diagnosis present

## 2012-12-27 DIAGNOSIS — G4700 Insomnia, unspecified: Secondary | ICD-10-CM | POA: Diagnosis present

## 2012-12-27 DIAGNOSIS — F329 Major depressive disorder, single episode, unspecified: Secondary | ICD-10-CM | POA: Diagnosis present

## 2012-12-27 DIAGNOSIS — Z79899 Other long term (current) drug therapy: Secondary | ICD-10-CM

## 2012-12-27 DIAGNOSIS — M359 Systemic involvement of connective tissue, unspecified: Secondary | ICD-10-CM | POA: Diagnosis present

## 2012-12-27 DIAGNOSIS — E559 Vitamin D deficiency, unspecified: Secondary | ICD-10-CM | POA: Diagnosis present

## 2012-12-27 DIAGNOSIS — F1010 Alcohol abuse, uncomplicated: Secondary | ICD-10-CM | POA: Diagnosis present

## 2012-12-27 DIAGNOSIS — K219 Gastro-esophageal reflux disease without esophagitis: Secondary | ICD-10-CM | POA: Diagnosis present

## 2012-12-27 DIAGNOSIS — I10 Essential (primary) hypertension: Secondary | ICD-10-CM | POA: Diagnosis present

## 2012-12-27 DIAGNOSIS — M1710 Unilateral primary osteoarthritis, unspecified knee: Principal | ICD-10-CM | POA: Diagnosis present

## 2012-12-27 HISTORY — PX: TOTAL KNEE ARTHROPLASTY: SHX125

## 2012-12-27 HISTORY — DX: Fibromyalgia: M79.7

## 2012-12-27 LAB — CBC
HCT: 40.4 % (ref 39.0–52.0)
Hemoglobin: 14.2 g/dL (ref 13.0–17.0)
MCH: 30 pg (ref 26.0–34.0)
MCHC: 35.1 g/dL (ref 30.0–36.0)
RBC: 4.73 MIL/uL (ref 4.22–5.81)

## 2012-12-27 LAB — CREATININE, SERUM: Creatinine, Ser: 0.64 mg/dL (ref 0.50–1.35)

## 2012-12-27 SURGERY — ARTHROPLASTY, KNEE, TOTAL
Anesthesia: Regional | Site: Knee | Laterality: Right | Wound class: Clean

## 2012-12-27 MED ORDER — AMLODIPINE BESYLATE 10 MG PO TABS
10.0000 mg | ORAL_TABLET | Freq: Every day | ORAL | Status: DC
  Administered 2012-12-28: 10 mg via ORAL
  Filled 2012-12-27: qty 1

## 2012-12-27 MED ORDER — ACETAMINOPHEN 10 MG/ML IV SOLN
1000.0000 mg | Freq: Four times a day (QID) | INTRAVENOUS | Status: DC
  Administered 2012-12-27 – 2012-12-28 (×3): 1000 mg via INTRAVENOUS
  Filled 2012-12-27 (×4): qty 100

## 2012-12-27 MED ORDER — LACTATED RINGERS IV SOLN
INTRAVENOUS | Status: DC
  Administered 2012-12-27: 50 mL/h via INTRAVENOUS

## 2012-12-27 MED ORDER — VITAMIN D (ERGOCALCIFEROL) 1.25 MG (50000 UNIT) PO CAPS
50000.0000 [IU] | ORAL_CAPSULE | ORAL | Status: DC
  Administered 2012-12-27: 50000 [IU] via ORAL
  Filled 2012-12-27: qty 1

## 2012-12-27 MED ORDER — LORAZEPAM 2 MG/ML IJ SOLN
0.5000 mg | Freq: Four times a day (QID) | INTRAMUSCULAR | Status: DC | PRN
Start: 2012-12-27 — End: 2012-12-28

## 2012-12-27 MED ORDER — ONDANSETRON HCL 4 MG/2ML IJ SOLN: 4.0000 mg | Freq: Four times a day (QID) | INTRAMUSCULAR | Status: DC | PRN

## 2012-12-27 MED ORDER — OXYCODONE HCL 5 MG PO TABS
10.0000 mg | ORAL_TABLET | ORAL | Status: DC | PRN
  Administered 2012-12-28 (×2): 10 mg via ORAL
  Filled 2012-12-27 (×2): qty 2

## 2012-12-27 MED ORDER — ALUM & MAG HYDROXIDE-SIMETH 200-200-20 MG/5ML PO SUSP: 30.0000 mL | ORAL | Status: DC | PRN

## 2012-12-27 MED ORDER — ONDANSETRON HCL 4 MG PO TABS: 4.0000 mg | ORAL_TABLET | Freq: Four times a day (QID) | ORAL | Status: DC | PRN

## 2012-12-27 MED ORDER — HYDROMORPHONE HCL PF 1 MG/ML IJ SOLN
0.2500 mg | INTRAMUSCULAR | Status: DC | PRN
  Administered 2012-12-27 (×4): .2 mg via INTRAVENOUS
  Administered 2012-12-27: 0.5 mg via INTRAVENOUS
  Administered 2012-12-27: .2 mg via INTRAVENOUS
  Administered 2012-12-27: 0.5 mg via INTRAVENOUS

## 2012-12-27 MED ORDER — DIPHENHYDRAMINE HCL 12.5 MG/5ML PO ELIX: 12.5000 mg | ORAL_SOLUTION | ORAL | Status: DC | PRN

## 2012-12-27 MED ORDER — MIDAZOLAM HCL 2 MG/2ML IJ SOLN: 1.0000 mg | INTRAMUSCULAR | Status: DC | PRN

## 2012-12-27 MED ORDER — CHLORHEXIDINE GLUCONATE 4 % EX LIQD
60.0000 mL | Freq: Once | CUTANEOUS | Status: DC
Start: 2012-12-27 — End: 2012-12-27

## 2012-12-27 MED ORDER — BUPIVACAINE HCL (PF) 0.5 % IJ SOLN
INTRAMUSCULAR | Status: DC | PRN
  Administered 2012-12-27: 25 mL

## 2012-12-27 MED ORDER — METOCLOPRAMIDE HCL 5 MG/ML IJ SOLN: 10.0000 mg | Freq: Once | INTRAMUSCULAR | Status: DC | PRN

## 2012-12-27 MED ORDER — HYDROMORPHONE HCL PF 1 MG/ML IJ SOLN
INTRAMUSCULAR | Status: AC
  Filled 2012-12-27: qty 1

## 2012-12-27 MED ORDER — LABETALOL HCL 5 MG/ML IV SOLN
INTRAVENOUS | Status: DC | PRN
  Administered 2012-12-27 (×4): 5 mg via INTRAVENOUS

## 2012-12-27 MED ORDER — POTASSIUM CHLORIDE IN NACL 20-0.9 MEQ/L-% IV SOLN
INTRAVENOUS | Status: DC
  Administered 2012-12-27: 100 mL/h via INTRAVENOUS
  Administered 2012-12-28 (×2): via INTRAVENOUS
  Filled 2012-12-27 (×4): qty 1000

## 2012-12-27 MED ORDER — SODIUM CHLORIDE 0.9 % IR SOLN
Status: DC | PRN
  Administered 2012-12-27: 1000 mL
  Administered 2012-12-27: 3000 mL

## 2012-12-27 MED ORDER — NEOSTIGMINE METHYLSULFATE 1 MG/ML IJ SOLN
INTRAMUSCULAR | Status: DC | PRN
  Administered 2012-12-27: 5 mg via INTRAVENOUS

## 2012-12-27 MED ORDER — ACETAMINOPHEN 325 MG PO TABS: 650.0000 mg | ORAL_TABLET | Freq: Four times a day (QID) | ORAL | Status: DC | PRN

## 2012-12-27 MED ORDER — MIDAZOLAM HCL 5 MG/5ML IJ SOLN: INTRAMUSCULAR | Status: DC | PRN

## 2012-12-27 MED ORDER — POVIDONE-IODINE 7.5 % EX SOLN
Freq: Once | CUTANEOUS | Status: DC
  Filled 2012-12-27: qty 118

## 2012-12-27 MED ORDER — METOCLOPRAMIDE HCL 5 MG/ML IJ SOLN: 5.0000 mg | Freq: Three times a day (TID) | INTRAMUSCULAR | Status: DC | PRN

## 2012-12-27 MED ORDER — CEFUROXIME SODIUM 1.5 G IJ SOLR
INTRAMUSCULAR | Status: AC
  Filled 2012-12-27: qty 1.5

## 2012-12-27 MED ORDER — MIDAZOLAM HCL 2 MG/2ML IJ SOLN
INTRAMUSCULAR | Status: AC
  Administered 2012-12-27: 2 mg
  Filled 2012-12-27: qty 2

## 2012-12-27 MED ORDER — DOCUSATE SODIUM 100 MG PO CAPS
100.0000 mg | ORAL_CAPSULE | Freq: Two times a day (BID) | ORAL | Status: DC
  Administered 2012-12-27 – 2012-12-28 (×2): 100 mg via ORAL
  Filled 2012-12-27 (×3): qty 1

## 2012-12-27 MED ORDER — LORAZEPAM 1 MG PO TABS: 1.0000 mg | ORAL_TABLET | Freq: Four times a day (QID) | ORAL | Status: DC | PRN

## 2012-12-27 MED ORDER — FENTANYL CITRATE 0.05 MG/ML IJ SOLN: 50.0000 ug | INTRAMUSCULAR | Status: DC | PRN

## 2012-12-27 MED ORDER — LACTATED RINGERS IV SOLN
INTRAVENOUS | Status: DC | PRN
  Administered 2012-12-27 (×3): via INTRAVENOUS

## 2012-12-27 MED ORDER — BUPIVACAINE-EPINEPHRINE 0.25% -1:200000 IJ SOLN
INTRAMUSCULAR | Status: DC | PRN
  Administered 2012-12-27: 30 mL

## 2012-12-27 MED ORDER — DEXAMETHASONE SODIUM PHOSPHATE 4 MG/ML IJ SOLN
INTRAMUSCULAR | Status: DC | PRN
  Administered 2012-12-27: 8 mg via INTRAVENOUS

## 2012-12-27 MED ORDER — ONDANSETRON HCL 4 MG/2ML IJ SOLN
INTRAMUSCULAR | Status: DC | PRN
  Administered 2012-12-27: 4 mg via INTRAVENOUS

## 2012-12-27 MED ORDER — OXYCODONE HCL 5 MG/5ML PO SOLN: 5.0000 mg | Freq: Once | ORAL | Status: DC | PRN

## 2012-12-27 MED ORDER — ZOLPIDEM TARTRATE 5 MG PO TABS: 5.0000 mg | ORAL_TABLET | Freq: Every evening | ORAL | Status: DC | PRN

## 2012-12-27 MED ORDER — ACETAMINOPHEN 10 MG/ML IV SOLN
INTRAVENOUS | Status: DC | PRN
  Administered 2012-12-27: 1000 mg via INTRAVENOUS

## 2012-12-27 MED ORDER — METOCLOPRAMIDE HCL 10 MG PO TABS: 5.0000 mg | ORAL_TABLET | Freq: Three times a day (TID) | ORAL | Status: DC | PRN

## 2012-12-27 MED ORDER — PROPOFOL 10 MG/ML IV BOLUS
INTRAVENOUS | Status: DC | PRN
  Administered 2012-12-27: 200 mg via INTRAVENOUS

## 2012-12-27 MED ORDER — CEFAZOLIN SODIUM-DEXTROSE 2-3 GM-% IV SOLR
2.0000 g | Freq: Four times a day (QID) | INTRAVENOUS | Status: AC
  Administered 2012-12-27 – 2012-12-28 (×2): 2 g via INTRAVENOUS
  Filled 2012-12-27 (×3): qty 50

## 2012-12-27 MED ORDER — CELECOXIB 200 MG PO CAPS
200.0000 mg | ORAL_CAPSULE | Freq: Two times a day (BID) | ORAL | Status: DC
  Administered 2012-12-27 – 2012-12-28 (×2): 200 mg via ORAL
  Filled 2012-12-27 (×3): qty 1

## 2012-12-27 MED ORDER — PHENYLEPHRINE HCL 10 MG/ML IJ SOLN
INTRAMUSCULAR | Status: DC | PRN
  Administered 2012-12-27: 40 ug via INTRAVENOUS
  Administered 2012-12-27: 80 ug via INTRAVENOUS

## 2012-12-27 MED ORDER — ACETAMINOPHEN 10 MG/ML IV SOLN
INTRAVENOUS | Status: AC
  Filled 2012-12-27: qty 100

## 2012-12-27 MED ORDER — HYDROXYCHLOROQUINE SULFATE 200 MG PO TABS
200.0000 mg | ORAL_TABLET | Freq: Two times a day (BID) | ORAL | Status: DC
  Administered 2012-12-28: 200 mg via ORAL
  Filled 2012-12-27 (×3): qty 1

## 2012-12-27 MED ORDER — FENTANYL CITRATE 0.05 MG/ML IJ SOLN
INTRAMUSCULAR | Status: DC | PRN
  Administered 2012-12-27 (×2): 50 ug via INTRAVENOUS
  Administered 2012-12-27 (×2): 25 ug via INTRAVENOUS
  Administered 2012-12-27: 100 ug via INTRAVENOUS
  Administered 2012-12-27 (×5): 50 ug via INTRAVENOUS

## 2012-12-27 MED ORDER — FENTANYL CITRATE 0.05 MG/ML IJ SOLN
INTRAMUSCULAR | Status: AC
  Administered 2012-12-27: 50 ug
  Filled 2012-12-27: qty 2

## 2012-12-27 MED ORDER — EPHEDRINE SULFATE 50 MG/ML IJ SOLN
INTRAMUSCULAR | Status: DC | PRN
  Administered 2012-12-27: 10 mg via INTRAVENOUS
  Administered 2012-12-27: 5 mg via INTRAVENOUS
  Administered 2012-12-27: 10 mg via INTRAVENOUS

## 2012-12-27 MED ORDER — MENTHOL 3 MG MT LOZG: 1.0000 | LOZENGE | OROMUCOSAL | Status: DC | PRN

## 2012-12-27 MED ORDER — OXYCODONE HCL 5 MG PO TABS: 5.0000 mg | ORAL_TABLET | Freq: Once | ORAL | Status: DC | PRN

## 2012-12-27 MED ORDER — LIDOCAINE HCL (CARDIAC) 20 MG/ML IV SOLN
INTRAVENOUS | Status: DC | PRN
  Administered 2012-12-27: 40 mg via INTRAVENOUS

## 2012-12-27 MED ORDER — ACETAMINOPHEN 650 MG RE SUPP: 650.0000 mg | Freq: Four times a day (QID) | RECTAL | Status: DC | PRN

## 2012-12-27 MED ORDER — DULOXETINE HCL 60 MG PO CPEP
60.0000 mg | ORAL_CAPSULE | Freq: Two times a day (BID) | ORAL | Status: DC
  Administered 2012-12-28: 60 mg via ORAL
  Filled 2012-12-27 (×3): qty 1

## 2012-12-27 MED ORDER — PANTOPRAZOLE SODIUM 40 MG PO TBEC
80.0000 mg | DELAYED_RELEASE_TABLET | Freq: Every day | ORAL | Status: DC
  Administered 2012-12-27 – 2012-12-28 (×2): 80 mg via ORAL
  Filled 2012-12-27: qty 2

## 2012-12-27 MED ORDER — ROCURONIUM BROMIDE 100 MG/10ML IV SOLN
INTRAVENOUS | Status: DC | PRN
  Administered 2012-12-27: 50 mg via INTRAVENOUS

## 2012-12-27 MED ORDER — PHENOL 1.4 % MT LIQD: 1.0000 | OROMUCOSAL | Status: DC | PRN

## 2012-12-27 MED ORDER — DEXAMETHASONE 4 MG PO TABS
10.0000 mg | ORAL_TABLET | Freq: Three times a day (TID) | ORAL | Status: AC
  Administered 2012-12-27 – 2012-12-28 (×3): 10 mg via ORAL
  Filled 2012-12-27 (×4): qty 1

## 2012-12-27 MED ORDER — DEXAMETHASONE SODIUM PHOSPHATE 10 MG/ML IJ SOLN
10.0000 mg | Freq: Three times a day (TID) | INTRAMUSCULAR | Status: AC
  Filled 2012-12-27 (×4): qty 1

## 2012-12-27 MED ORDER — ENOXAPARIN SODIUM 30 MG/0.3ML ~~LOC~~ SOLN
30.0000 mg | Freq: Two times a day (BID) | SUBCUTANEOUS | Status: DC
  Administered 2012-12-28: 30 mg via SUBCUTANEOUS
  Filled 2012-12-27 (×3): qty 0.3

## 2012-12-27 MED ORDER — ARTIFICIAL TEARS OP OINT
TOPICAL_OINTMENT | OPHTHALMIC | Status: DC | PRN
  Administered 2012-12-27: 1 via OPHTHALMIC

## 2012-12-27 MED ORDER — HYDROMORPHONE HCL PF 1 MG/ML IJ SOLN
0.5000 mg | INTRAMUSCULAR | Status: DC | PRN
  Administered 2012-12-27 (×2): 1 mg via INTRAVENOUS
  Filled 2012-12-27 (×2): qty 1

## 2012-12-27 MED ORDER — CHLORHEXIDINE GLUCONATE 4 % EX LIQD: 60.0000 mL | Freq: Once | CUTANEOUS | Status: DC

## 2012-12-27 MED ORDER — GLYCOPYRROLATE 0.2 MG/ML IJ SOLN
INTRAMUSCULAR | Status: DC | PRN
  Administered 2012-12-27: .8 mg via INTRAVENOUS

## 2012-12-27 MED ORDER — CEFUROXIME SODIUM 1.5 G IJ SOLR
INTRAMUSCULAR | Status: DC | PRN
  Administered 2012-12-27: 1.5 g

## 2012-12-27 SURGICAL SUPPLY — 69 items
BANDAGE ESMARK 6X9 LF (GAUZE/BANDAGES/DRESSINGS) ×1 IMPLANT
BLADE SAGITTAL 25.0X1.19X90 (BLADE) ×2 IMPLANT
BLADE SAW SGTL 11.0X1.19X90.0M (BLADE) IMPLANT
BLADE SAW SGTL 13.0X1.19X90.0M (BLADE) ×2 IMPLANT
BLADE SURG 10 STRL SS (BLADE) ×4 IMPLANT
BNDG ELASTIC 6X15 VLCR STRL LF (GAUZE/BANDAGES/DRESSINGS) ×2 IMPLANT
BNDG ESMARK 6X9 LF (GAUZE/BANDAGES/DRESSINGS) ×2
BOWL SMART MIX CTS (DISPOSABLE) ×2 IMPLANT
CEMENT HV SMART SET (Cement) ×4 IMPLANT
CLOTH BEACON ORANGE TIMEOUT ST (SAFETY) ×2 IMPLANT
COVER BACK TABLE 24X17X13 BIG (DRAPES) IMPLANT
COVER SURGICAL LIGHT HANDLE (MISCELLANEOUS) ×2 IMPLANT
CUFF TOURNIQUET SINGLE 34IN LL (TOURNIQUET CUFF) ×2 IMPLANT
CUFF TOURNIQUET SINGLE 44IN (TOURNIQUET CUFF) IMPLANT
DRAPE EXTREMITY T 121X128X90 (DRAPE) ×2 IMPLANT
DRAPE INCISE IOBAN 66X45 STRL (DRAPES) ×2 IMPLANT
DRAPE PROXIMA HALF (DRAPES) ×2 IMPLANT
DRAPE U-SHAPE 47X51 STRL (DRAPES) ×2 IMPLANT
DRSG ADAPTIC 3X8 NADH LF (GAUZE/BANDAGES/DRESSINGS) ×2 IMPLANT
DRSG PAD ABDOMINAL 8X10 ST (GAUZE/BANDAGES/DRESSINGS) ×4 IMPLANT
DURAPREP 26ML APPLICATOR (WOUND CARE) ×2 IMPLANT
ELECT CAUTERY BLADE 6.4 (BLADE) ×2 IMPLANT
ELECT REM PT RETURN 9FT ADLT (ELECTROSURGICAL) ×2
ELECTRODE REM PT RTRN 9FT ADLT (ELECTROSURGICAL) ×1 IMPLANT
EVACUATOR 1/8 PVC DRAIN (DRAIN) IMPLANT
FACESHIELD LNG OPTICON STERILE (SAFETY) ×2 IMPLANT
GLOVE BIO SURGEON STRL SZ7 (GLOVE) ×4 IMPLANT
GLOVE BIOGEL PI IND STRL 7.0 (GLOVE) ×1 IMPLANT
GLOVE BIOGEL PI IND STRL 7.5 (GLOVE) ×2 IMPLANT
GLOVE BIOGEL PI INDICATOR 7.0 (GLOVE) ×1
GLOVE BIOGEL PI INDICATOR 7.5 (GLOVE) ×2
GLOVE BIOGEL PI ORTHO PRO SZ7 (GLOVE) ×1
GLOVE PI ORTHO PRO STRL SZ7 (GLOVE) ×1 IMPLANT
GLOVE SS BIOGEL STRL SZ 7.5 (GLOVE) ×3 IMPLANT
GLOVE SUPERSENSE BIOGEL SZ 7.5 (GLOVE) ×3
GLOVE SURG SS PI 7.0 STRL IVOR (GLOVE) ×4 IMPLANT
GOWN PREVENTION PLUS XLARGE (GOWN DISPOSABLE) ×4 IMPLANT
GOWN STRL NON-REIN LRG LVL3 (GOWN DISPOSABLE) ×4 IMPLANT
GOWN STRL REIN XL XLG (GOWN DISPOSABLE) ×2 IMPLANT
HANDPIECE INTERPULSE COAX TIP (DISPOSABLE) ×1
HOOD PEEL AWAY FACE SHEILD DIS (HOOD) ×4 IMPLANT
IMMOBILIZER KNEE 22 UNIV (SOFTGOODS) ×2 IMPLANT
INSERT CUSHION PRONEVIEW LG (MISCELLANEOUS) ×2 IMPLANT
KIT BASIN OR (CUSTOM PROCEDURE TRAY) ×2 IMPLANT
KIT ROOM TURNOVER OR (KITS) ×2 IMPLANT
MANIFOLD NEPTUNE II (INSTRUMENTS) ×2 IMPLANT
NS IRRIG 1000ML POUR BTL (IV SOLUTION) ×2 IMPLANT
PACK TOTAL JOINT (CUSTOM PROCEDURE TRAY) ×2 IMPLANT
PAD ARMBOARD 7.5X6 YLW CONV (MISCELLANEOUS) ×4 IMPLANT
PAD CAST 4YDX4 CTTN HI CHSV (CAST SUPPLIES) ×1 IMPLANT
PADDING CAST COTTON 4X4 STRL (CAST SUPPLIES) ×1
PADDING CAST COTTON 6X4 STRL (CAST SUPPLIES) ×2 IMPLANT
POSITIONER HEAD PRONE TRACH (MISCELLANEOUS) ×2 IMPLANT
RUBBERBAND STERILE (MISCELLANEOUS) ×2 IMPLANT
SET HNDPC FAN SPRY TIP SCT (DISPOSABLE) ×1 IMPLANT
SPONGE GAUZE 4X4 12PLY (GAUZE/BANDAGES/DRESSINGS) ×2 IMPLANT
STRIP CLOSURE SKIN 1/2X4 (GAUZE/BANDAGES/DRESSINGS) ×2 IMPLANT
SUCTION FRAZIER TIP 10 FR DISP (SUCTIONS) ×2 IMPLANT
SUT ETHIBOND NAB CT1 #1 30IN (SUTURE) ×4 IMPLANT
SUT MNCRL AB 3-0 PS2 18 (SUTURE) ×2 IMPLANT
SUT VIC AB 0 CT1 27 (SUTURE) ×2
SUT VIC AB 0 CT1 27XBRD ANBCTR (SUTURE) ×2 IMPLANT
SUT VIC AB 2-0 CT1 27 (SUTURE) ×2
SUT VIC AB 2-0 CT1 TAPERPNT 27 (SUTURE) ×2 IMPLANT
SYR 30ML SLIP (SYRINGE) ×2 IMPLANT
TOWEL OR 17X24 6PK STRL BLUE (TOWEL DISPOSABLE) ×2 IMPLANT
TOWEL OR 17X26 10 PK STRL BLUE (TOWEL DISPOSABLE) ×2 IMPLANT
TRAY FOLEY CATH 14FR (SET/KITS/TRAYS/PACK) ×2 IMPLANT
WATER STERILE IRR 1000ML POUR (IV SOLUTION) ×4 IMPLANT

## 2012-12-27 NOTE — Op Note (Signed)
MRN:     [id number] DOB/AGE:    64-Aug-1952 / 64 y.o.       OPERATIVE REPORT    DATE OF PROCEDURE:  12/27/2012       PREOPERATIVE DIAGNOSIS:   RIGHT KNEE DEGENERATIVE ARTHRITIS       Estimated Body mass index is 29.09 kg/(m^2) as calculated from the following:   Height as of 11/01/12: 5' 6.5"(1.689 m).   Weight as of 11/01/12: 183 lb(83.008 kg).                                                        POSTOPERATIVE DIAGNOSIS:   RIGHT KNEE DEGENERATIVE ARTHRITIS                                                                       PROCEDURE:  Procedure(s): TOTAL KNEE ARTHROPLASTY Using Depuy Sigma RP implants #3 Femur, #3Tibia, 10mm sigma RP bearing, 32 Patella     SURGEON: Aylyn Wenzler A    ASSISTANT:  Kirstin Shepperson PA-C   (Present and scrubbed throughout the case, critical for assistance with exposure, retraction, instrumentation, and closure.)         ANESTHESIA: GET with Femoral Nerve Block  DRAINS: foley, 2 medium hemovac in knee   TOURNIQUET TIME:   COMPLICATIONS:  None     SPECIMENS: None   INDICATIONS FOR PROCEDURE: The patient has  RIGHT KNEE DEGENERATIVE ARTHRITIS , varus deformities, XR shows bone on bone arthritis. Patient has failed all conservative measures including anti-inflammatory medicines, narcotics, attempts at  exercise and weight loss, cortisone injections and viscosupplementation.  Risks and benefits of surgery have been discussed, questions answered.   DESCRIPTION OF PROCEDURE: The patient identified by armband, received  right femoral nerve block and IV antibiotics, in the holding area at [facility]. Patient taken to the operating room, appropriate anesthetic  monitors were attached General endotracheal anesthesia induced with  the patient in supine position, Foley catheter was inserted. Tourniquet  applied high to the operative thigh. Lateral post and foot positioner  applied to the table, the lower extremity was then prepped and draped    in usual sterile fashion from the ankle to the tourniquet. Time-out procedure was performed. The limb was wrapped with an Esmarch bandage and the tourniquet inflated to 365 mmHg. We began the operation by making the anterior midline incision starting at handbreadth above the patella going over the patella 1 cm medial to and  4 cm distal to the tibial tubercle. Small bleeders in the skin and the  subcutaneous tissue identified and cauterized. Transverse retinaculum was incised and reflected medially and a medial parapatellar arthrotomy was accomplished. the patella was everted and theprepatellar fat pad resected. The superficial medial collateral  ligament was then elevated from anterior to posterior along the proximal  flare of the tibia and anterior half of the menisci resected. The knee was hyperflexed exposing bone on bone arthritis. Peripheral and notch osteophytes as well as the cruciate ligaments were then resected. We continued to  work our way around posteriorly along the proximal  tibia, and externally  rotated the tibia subluxing it out from underneath the femur. A McHale  retractor was placed through the notch and a lateral Hohmann retractor  placed, and we then drilled through the proximal tibia in line with the  axis of the tibia followed by an intramedullary guide rod and 2-degree  posterior slope cutting guide. The tibial cutting guide was pinned into place  allowing resection of 4 mm of bone medially and about 6 mm of bone  laterally because of her varus deformity. Satisfied with the tibial resection, we then  entered the distal femur 2 mm anterior to the PCL origin with the  intramedullary guide rod and applied the distal femoral cutting guide  set at 11mm, with 5 degrees of valgus. This was pinned along the  epicondylar axis. At this point, the distal femoral cut was accomplished without difficulty. We then sized for a #3 femoral component and pinned the guide in 3 degrees of  external rotation.The chamfer cutting guide was pinned into place. The anterior, posterior, and chamfer cuts were accomplished without difficulty followed by  the Sigma RP box cutting guide and the box cut. We also removed posterior osteophytes from the posterior femoral condyles. At this  time, the knee was brought into full extension. We checked our  extension and flexion gaps and found them symmetric at 10mm.  The patella thickness measured at 21 mm. We set the cutting guide at 12 and removed the posterior 9.5-10 mm  of the patella sized for 32 button and drilled the lollipop. The knee  was then once again hyperflexed exposing the proximal tibia. We sized for a #3 tibial base plate, applied the smokestack and the conical reamer followed by the the Delta fin keel punch. We then hammered into place the Sigma RP trial femoral component, inserted a 10-mm trial bearing, trial patellar button, and took the knee through range of motion from 0-130 degrees. No thumb pressure was required for patellar  tracking. At this point, all trial components were removed, a double batch of DePuy HV cement with 1500 mg of Zinacef was mixed and applied to all bony metallic mating surfaces except for the posterior condyles of the femur itself. In order, we  hammered into place the tibial tray and removed excess cement, the femoral component and removed excess cement, a 10-mm Sigma RP bearing  was inserted, and the knee brought to full extension with compression.  The patellar button was clamped into place, and excess cement  removed. While the cement cured the wound was irrigated out with normal saline solution pulse lavage, and medium Hemovac drains were placed.. Ligament stability and patellar tracking were checked and found to be excellent. The tourniquet was then released and hemostasis was obtained with cautery. The parapatellar arthrotomy was closed with  #1 ethibond suture. The subcutaneous tissue with 0 and 2-0 undyed   Vicryl suture, and 4-0 Monocryl.. A dressing of Xeroform,  4 x 4, dressing sponges, Webril, and Ace wrap applied. Needle and sponge count were correct times 2.The patient awakened, extubated, and taken to recovery room without difficulty. Vascular status was normal, pulses 2+ and symmetric.   Glenard Keesling A 12/27/2012, 12:46 PM

## 2012-12-27 NOTE — Anesthesia Preprocedure Evaluation (Signed)
Anesthesia Evaluation  Patient identified by MRN, date of birth, ID band Patient awake    Reviewed: Allergy & Precautions, H&P , NPO status , Patient's Chart, lab work & pertinent test results, reviewed documented beta blocker date and time   Airway Mallampati: II TM Distance: >3 FB Neck ROM: full    Dental   Pulmonary neg pulmonary ROS,  breath sounds clear to auscultation        Cardiovascular hypertension, On Medications Rhythm:regular     Neuro/Psych PSYCHIATRIC DISORDERS negative neurological ROS     GI/Hepatic Neg liver ROS, GERD-  Medicated and Controlled,  Endo/Other  negative endocrine ROS  Renal/GU negative Renal ROS  negative genitourinary   Musculoskeletal   Abdominal   Peds  Hematology negative hematology ROS (+)   Anesthesia Other Findings See surgeon's H&P   Reproductive/Obstetrics negative OB ROS                           Anesthesia Physical Anesthesia Plan  ASA: III  Anesthesia Plan: General   Post-op Pain Management:    Induction: Intravenous  Airway Management Planned: Oral ETT  Additional Equipment:   Intra-op Plan:   Post-operative Plan: Extubation in OR  Informed Consent: I have reviewed the patients History and Physical, chart, labs and discussed the procedure including the risks, benefits and alternatives for the proposed anesthesia with the patient or authorized representative who has indicated his/her understanding and acceptance.   Dental Advisory Given  Plan Discussed with: CRNA and Surgeon  Anesthesia Plan Comments:         Anesthesia Quick Evaluation

## 2012-12-27 NOTE — Evaluation (Signed)
Physical Therapy Evaluation Patient Details Name: David Weaver MRN: [id number] DOB: 07/07/1951 Today's Date: 12/27/2012 Time: 7829-5621 PT Time Calculation (min): 22 min  PT Assessment / Plan / Recommendation Clinical Impression  Patient is a 62 yo male s/p Rt. TKA.  Will benefit from acute PT to maximize independence prior to return home.  Recommend HHPT at discharge.    PT Assessment  Patient needs continued PT services    Follow Up Recommendations  Home health PT;Supervision/Assistance - 24 hour    Does the patient have the potential to tolerate intense rehabilitation      Barriers to Discharge None      Equipment Recommendations  None recommended by PT    Recommendations for Other Services     Frequency 7X/week    Precautions / Restrictions Precautions Precautions: Knee Precaution Booklet Issued: Yes (comment) Precaution Comments: Provided precaution education to patient and son Required Braces or Orthoses: Knee Immobilizer - Right Knee Immobilizer - Right: On except when in CPM;Discontinue once straight leg raise with < 10 degree lag Restrictions Weight Bearing Restrictions: Yes RLE Weight Bearing: Weight bearing as tolerated   Pertinent Vitals/Pain       Mobility  Bed Mobility Bed Mobility: Supine to Sit;Sitting - Scoot to Edge of Bed;Sit to Supine Supine to Sit: 4: Min assist;With rails;HOB elevated Sitting - Scoot to Edge of Bed: 4: Min guard;With rail Sit to Supine: 4: Min assist;With rail;HOB flat Details for Bed Mobility Assistance: Verbal cues for technique.  Assist to move RLE off of and onto bed.  Instructed patient on donning knee immobilizer. Transfers Transfers: Sit to Stand;Stand to Sit Sit to Stand: 4: Min assist;With upper extremity assist;From bed Stand to Sit: 4: Min guard;With upper extremity assist;To bed Details for Transfer Assistance: Verbal cues for hand placement and placement of RLE during transfers. Ambulation/Gait Ambulation/Gait  Assistance: 4: Min assist Ambulation Distance (Feet): 6 Feet Assistive device: Rolling walker Ambulation/Gait Assistance Details: Verbal cues for safe use of RW and gait sequence.  Patient able to take steps to go 3' forward and 3' backward. Gait Pattern: Step-to pattern;Decreased stance time - right;Decreased step length - left;Antalgic Gait velocity: Slow gait speed       Exercises Total Joint Exercises Ankle Circles/Pumps: AROM;Both;10 reps;Supine   PT Diagnosis: Difficulty walking;Acute pain  PT Problem List: Decreased strength;Decreased range of motion;Decreased activity tolerance;Decreased mobility;Decreased knowledge of use of DME;Decreased knowledge of precautions;Pain PT Treatment Interventions: DME instruction;Gait training;Stair training;Functional mobility training;Therapeutic exercise;Patient/family education   PT Goals Acute Rehab PT Goals PT Goal Formulation: With patient Time For Goal Achievement: 01/03/13 Potential to Achieve Goals: Good Pt will go Supine/Side to Sit: Independently;with HOB 0 degrees PT Goal: Supine/Side to Sit - Progress: Goal set today Pt will go Sit to Stand: with supervision;with upper extremity assist PT Goal: Sit to Stand - Progress: Goal set today Pt will Ambulate: 51 - 150 feet;with supervision;with rolling walker PT Goal: Ambulate - Progress: Goal set today Pt will Go Up / Down Stairs: 1-2 stairs;with min assist;with least restrictive assistive device PT Goal: Up/Down Stairs - Progress: Goal set today Pt will Perform Home Exercise Program: Independently PT Goal: Perform Home Exercise Program - Progress: Goal set today  Visit Information  Last PT Received On: 12/27/12 Assistance Needed: +1    Subjective Data  Subjective: "I'm feeling pretty good right now." Patient Stated Goal: To go home soon.   Prior Functioning  Home Living Lives With: Spouse;Son Available Help at Discharge: Family;Available 24 hours/day (Sister  also staying with  patient to help) Type of Home: House Home Access: Stairs to enter [employer] of Steps: 1 Entrance Stairs-Rails: None Home Layout: One level Bathroom Shower/Tub: Health visitor: Standard Bathroom Accessibility: Yes How Accessible: Accessible via walker Home Adaptive Equipment: Bedside commode/3-in-1;Walker - rolling;Straight cane Prior Function Level of Independence: Independent Able to Take Stairs?: Yes Driving: Yes Vocation: Full time employment Communication Communication: No difficulties    Cognition  Overall Cognitive Status: Appears within functional limits for tasks assessed/performed Arousal/Alertness: Awake/alert Orientation Level: Oriented X4 / Intact Behavior During Session: [facility] for tasks performed    Extremity/Trunk Assessment Right Upper Extremity Assessment RUE ROM/Strength/Tone: WFL for tasks assessed RUE Sensation: WFL - Light Touch Left Upper Extremity Assessment LUE ROM/Strength/Tone: WFL for tasks assessed LUE Sensation: WFL - Light Touch Right Lower Extremity Assessment RLE ROM/Strength/Tone: Deficits;Unable to fully assess;Due to pain RLE ROM/Strength/Tone Deficits: Able to assist moving RLE off of and onto bed Left Lower Extremity Assessment LLE ROM/Strength/Tone: WFL for tasks assessed LLE Sensation: WFL - Light Touch Trunk Assessment Trunk Assessment: Normal   Balance Balance Balance Assessed: Yes Static Sitting Balance Static Sitting - Balance Support: No upper extremity supported;Feet supported Static Sitting - Level of Assistance: 5: Stand by assistance Static Sitting - Comment/# of Minutes: 9  End of Session PT - End of Session Equipment Utilized During Treatment: Gait belt;Right knee immobilizer Activity Tolerance: Patient tolerated treatment well Patient left: in bed;with call bell/phone within reach;with family/visitor present Nurse Communication: Mobility status CPM Right Knee CPM Right Knee: Off  GP      Vena Austria 12/27/2012, 7:49 PM Durenda Hurt. Renaldo Fiddler, [facility] Acute Rehab Services Pager [phone]

## 2012-12-27 NOTE — Preoperative (Signed)
Beta Blockers   Reason not to administer Beta Blockers:Not Applicable 

## 2012-12-27 NOTE — Interval H&P Note (Signed)
History and Physical Interval Note:  12/27/2012 11:02 AM  David Weaver  has presented today for surgery, with the diagnosis of RIGHT KNEE DEGENERATIVE ARTHRITIS - LEG/KNEE 715.36  The various methods of treatment have been discussed with the patient and family. After consideration of risks, benefits and other options for treatment, the patient has consented to  Procedure(s) (LRB) with comments: TOTAL KNEE ARTHROPLASTY (Right) - RIGHT ARTHROPLASTY KNEE MEDIAL AND LATERAL COMPARTMENTS WITH PATELLA RESURFACING, RIGHT TOTAL KNEE REPLACEMENT as a surgical intervention .  The patient's history has been reviewed, patient examined, no change in status, stable for surgery.  I have reviewed the patient's chart and labs.  Questions were answered to the patient's satisfaction.     [name]

## 2012-12-27 NOTE — Progress Notes (Signed)
Orthopedic Tech Progress Note Patient Details:  David Weaver 10/26/1951 [id number]  CPM Right Knee CPM Right Knee: On Right Knee Flexion (Degrees): 60  Right Knee Extension (Degrees): 0  Additional Comments: [id number] bar patient helper Viewed order from rn order list  [name] 12/27/2012, 2:40 PM

## 2012-12-27 NOTE — H&P (View-Only) (Signed)
TOTAL KNEE ADMISSION H&P  Patient is being admitted for right total knee arthroplasty.  Subjective:  Chief Complaint:right knee pain.  HPI: David Weaver, 62 y.o. male, has a history of pain and functional disability in the right knee due to arthritis and has failed non-surgical conservative treatments for greater than 12 weeks to includeNSAID's and/or analgesics, corticosteriod injections, flexibility and strengthening excercises, supervised PT with diminished ADL's post treatment, use of assistive devices, weight reduction as appropriate and activity modification.  Onset of symptoms was gradual, starting 5 years ago with gradually worsening course since that time. The patient noted prior procedures on the knee to include  arthroscopy and menisectomy on the right knee(s).  Patient currently rates pain in the right knee(s) at 8 out of 10 with activity. Patient has night pain, worsening of pain with activity and weight bearing, pain that interferes with activities of daily living, crepitus and joint swelling.  Patient has evidence of subchondral sclerosis, periarticular osteophytes and joint space narrowing by imaging studies. There is no active infection.  Patient Active Problem List   Diagnosis Date Noted  . Osteoarthrosis, unspecified whether generalized or localized, lower leg   . Tobacco use disorder   . Alcohol abuse, daily use   . Esophageal reflux   . Lupus erythematosus   . Unspecified hearing loss   . Insomnia, unspecified   . Right knee DJD   . Unspecified vitamin D deficiency   . Essential hypertension, benign 11/01/2012  . Other abnormal glucose 11/01/2012  . Pre-operative clearance 11/01/2012  . Depression 11/01/2012  . Need for prophylactic vaccination and inoculation against influenza 11/01/2012  . Vitamin D deficiency 11/01/2012  . Insomnia 11/01/2012   Past Medical History  Diagnosis Date  . Other abnormal glucose   . Allergic rhinitis, cause unspecified   .  Osteoarthrosis, unspecified whether generalized or localized, lower leg   . Other specified disorder of male genital organs   . Other malaise and fatigue   . Tobacco use disorder     quit smokeing 2012  . Alcohol abuse, daily use   . Essential hypertension, benign   . Dysthymic disorder   . Esophageal reflux   . Impotence of organic origin   . Lupus erythematosus   . Unspecified hearing loss   . Insomnia, unspecified   . Unspecified vitamin D deficiency   . Lupus   . Right knee DJD     Past Surgical History  Procedure Date  . Cholecystectomy   . Carpal tunnel release     bilateral  . Ganglion cyst excision 1980    Left wrist  . Knee arthroscopy 2008    Right  . Lumbar spine epidural 2009    [facility]  . Rotator cuff repair 10/2008    right     (Not in a hospital admission) No Known Allergies   Current Outpatient Prescriptions on File Prior to Visit  Medication Sig Dispense Refill  . amLODipine (NORVASC) 10 MG tablet Take 10 mg by mouth daily.      [name] aspirin 81 MG chewable tablet Chew 81 mg by mouth daily.      . DULoxetine (CYMBALTA) 60 MG capsule Take 60 mg by mouth 2 (two) times daily.      . hydroxychloroquine (PLAQUENIL) 200 MG tablet Take 200 mg by mouth 2 (two) times daily.      [name] omeprazole (PRILOSEC) 40 MG capsule Take 40 mg by mouth daily.      [name] oxyCODONE-acetaminophen (  PERCOCET/ROXICET) 5-325 MG per tablet Take 1 tablet by mouth daily.      . Vitamin D, Ergocalciferol, (DRISDOL) 50000 UNITS CAPS Take 50,000 Units by mouth every 7 (seven) days.      . tadalafil (CIALIS) 20 MG tablet Take 20 mg by mouth daily as needed.        History  Substance Use Topics  . Smoking status: Former Smoker -- 2.0 packs/day for 30 years    Types: Cigarettes  . Smokeless tobacco: Not on file     Comment: quit 12 /2011  . Alcohol Use: Yes     Comment: 2 drinks of liquor, every afternoon    Family History  Problem Relation Age of Onset  . Alzheimer's disease  Mother   . Osteoporosis Mother   . Heart disease Father   . Alcohol abuse Father   . Cancer Brother     colon  . Diabetes Brother      Review of Systems  Constitutional: Negative.   HENT: Negative.   Eyes: Negative.   Respiratory: Negative.   Cardiovascular: Negative.   Gastrointestinal: Negative.   Genitourinary: Negative.   Musculoskeletal: Positive for joint pain.  Skin: Negative.   Neurological: Negative.   Endo/Heme/Allergies: Negative.   Psychiatric/Behavioral: Negative.     Objective:  Physical Exam  Constitutional: He is oriented to person, place, and time. He appears well-developed and well-nourished.  HENT:  Head: Normocephalic and atraumatic.  Eyes: EOM are normal. Pupils are equal, round, and reactive to light.  Neck: Neck supple.  Cardiovascular: Normal rate and regular rhythm.   Respiratory: Effort normal.  GI: Soft. Bowel sounds are normal.  Genitourinary:       Not pertinent to current symptomatology therefore not examined.  Musculoskeletal:       Examination of both knees reveals moderate varus deformity bilaterally, diffuse pain, 1+ crepitation 1+ synovitis range of motion -5 to 125 degrees both knees are stable with normal patella tracking. Gait exam reveals significant antalgic gait with pain in both knees. Vascular exam: pulses 2+ and symmetric.  Neurological: He is alert and oriented to person, place, and time.  Skin: Skin is warm and dry.  Psychiatric: He has a normal mood and affect. His behavior is normal.    Vital signs in last 24 hours: Last recorded: 01/15 1300   BP: 132/84 Pulse: 88  Temp: 98.7 F (37.1 C)    Height: 5\' 9"  (1.753 m) SpO2: 92  Weight: 83.008 kg (183 lb)     Labs:   Estimated Body mass index is 27.02 kg/(m^2) as calculated from the following:   Height as of this encounter: 5\' 9" (1.753 m).   Weight as of this encounter: 183 lb(83.008 kg).   Imaging Review Plain radiographs demonstrate severe degenerative joint  disease of the bilaterally knee(s). The overall alignment issignificant varus. The bone quality appears to be good for age and reported activity level.  Assessment/Plan:  End stage arthritis, right knee  Patient Active Problem List  Diagnosis  . Essential hypertension, benign  . Other abnormal glucose  . Pre-operative clearance  . Depression  . Need for prophylactic vaccination and inoculation against influenza  . Vitamin D deficiency  . Insomnia  . Osteoarthrosis, unspecified whether generalized or localized, lower leg  . Tobacco use disorder  . Alcohol abuse, daily use  . Esophageal reflux  . Lupus erythematosus  . Unspecified hearing loss  . Insomnia, unspecified  . Right knee DJD  . Unspecified vitamin D deficiency  The patient history, physical examination, clinical judgment of the provider and imaging studies are consistent with end stage degenerative joint disease of the right knee(s) and total knee arthroplasty is deemed medically necessary. The treatment options including medical management, injection therapy arthroscopy and arthroplasty were discussed at length. The risks and benefits of total knee arthroplasty were presented and reviewed. The risks due to aseptic loosening, infection, stiffness, patella tracking problems, thromboembolic complications and other imponderables were discussed. The patient acknowledged the explanation, agreed to proceed with the plan and consent was signed. Patient is being admitted for inpatient treatment for surgery, pain control, PT, OT, prophylactic antibiotics, VTE prophylaxis, progressive ambulation and ADL's and discharge planning. The patient is planning to be discharged home with home health services   Noreen Mackintosh A. Gwinda Passe Physician Assistant [facility] [phone]  12/15/2012, 2:55 PM

## 2012-12-27 NOTE — Anesthesia Procedure Notes (Signed)
Anesthesia Regional Block:  Femoral nerve block  Pre-Anesthetic Checklist: ,, timeout performed, Correct Patient, Correct Site, Correct Laterality, Correct Procedure, Correct Position, site marked, Risks and benefits discussed,  Surgical consent,  Pre-op evaluation,  At surgeon's request and post-op pain management  Laterality: Right  Prep: chloraprep       Needles:   Needle Type: Other     Needle Length: 9cm  Needle Gauge: 21    Additional Needles:  Procedures: ultrasound guided (picture in chart) Femoral nerve block Narrative:  Start time: 12/27/2012 10:44 AM End time: 12/27/2012 10:50 AM Injection made incrementally with aspirations every 5 mL.  Performed by: Personally  Anesthesiologist: C. Kaniel Kiang MD  Additional Notes: Ultrasound guidance used to: id relevant anatomy, confirm needle position, local anesthetic spread, avoidance of vascular puncture. Picture saved. No complications. Block performed personally by Janetta Hora. Gelene Mink, MD    Femoral nerve block

## 2012-12-27 NOTE — Transfer of Care (Signed)
Immediate Anesthesia Transfer of Care Note  Patient: David Weaver  Procedure(s) Performed: Procedure(s) (LRB) with comments: TOTAL KNEE ARTHROPLASTY (Right) - RIGHT ARTHROPLASTY KNEE MEDIAL AND LATERAL COMPARTMENTS WITH PATELLA RESURFACING, RIGHT TOTAL KNEE REPLACEMENT  Patient Location: PACU  Anesthesia Type:General  Level of Consciousness: awake, alert , oriented and patient cooperative  Airway & Oxygen Therapy: Patient Spontanous Breathing and Patient connected to face mask oxygen  Post-op Assessment: Report given to PACU RN, Post -op Vital signs reviewed and stable and Patient moving all extremities X 4  Post vital signs: Reviewed and stable  Complications: No apparent anesthesia complications

## 2012-12-28 ENCOUNTER — Encounter (HOSPITAL_COMMUNITY): Payer: Self-pay | Admitting: General Practice

## 2012-12-28 LAB — BASIC METABOLIC PANEL
CO2: 23 mEq/L (ref 19–32)
Chloride: 102 mEq/L (ref 96–112)
Glucose, Bld: 183 mg/dL — ABNORMAL HIGH (ref 70–99)
Sodium: 139 mEq/L (ref 135–145)

## 2012-12-28 LAB — CBC
Hemoglobin: 13.4 g/dL (ref 13.0–17.0)
MCH: 29.2 pg (ref 26.0–34.0)
MCV: 84.7 fL (ref 78.0–100.0)
Platelets: 233 10*3/uL (ref 150–400)
RBC: 4.59 MIL/uL (ref 4.22–5.81)
WBC: 11.1 10*3/uL — ABNORMAL HIGH (ref 4.0–10.5)

## 2012-12-28 MED ORDER — OXYCODONE HCL 15 MG PO TABS: ORAL_TABLET | ORAL | Status: DC

## 2012-12-28 MED ORDER — DSS 100 MG PO CAPS: 100.0000 mg | ORAL_CAPSULE | Freq: Two times a day (BID) | ORAL | Status: DC

## 2012-12-28 MED ORDER — ACETAMINOPHEN 325 MG PO TABS
650.0000 mg | ORAL_TABLET | Freq: Four times a day (QID) | ORAL | Status: DC | PRN
Start: 2012-12-28 — End: 2013-01-24

## 2012-12-28 MED ORDER — ENOXAPARIN SODIUM 30 MG/0.3ML ~~LOC~~ SOLN: 30.0000 mg | Freq: Two times a day (BID) | SUBCUTANEOUS | Status: DC

## 2012-12-28 MED ORDER — CELECOXIB 200 MG PO CAPS: 200.0000 mg | ORAL_CAPSULE | Freq: Every day | ORAL | Status: DC

## 2012-12-28 NOTE — Progress Notes (Signed)
CARE MANAGEMENT NOTE 12/28/2012  Patient:  David Weaver, David Weaver   Account Number:  [id number]  Date Initiated:  12/28/2012  Documentation initiated by:  Vance Peper  Subjective/Objective Assessment:   62 yr old male s/p  right total knee arthroplasty.     Action/Plan:   CM spoke with patient concerning home health and DME needs at discharge. Patient preoperatively setup with [facility], no changes. Rolling walker, 3in1 and CPM have been delivered to the home.   Anticipated DC Date:  12/28/2012   Anticipated DC Plan:  HOME W HOME HEALTH SERVICES      DC Planning Services  CM consult      [facility] Choice  HOME HEALTH   Choice offered to / List presented to:  C-1 Patient        HH arranged  HH-2 PT      [facility] agency  [facility]   Status of service:  Completed, signed off Medicare Important Message given?   (If response is "NO", the following Medicare IM given date fields will be blank) Date Medicare IM given:   Date Additional Medicare IM given:    Discharge Disposition:  HOME W HOME HEALTH SERVICES  Per UR Regulation:    If discussed at Long Length of Stay Meetings, dates discussed:    Comments:

## 2012-12-28 NOTE — Progress Notes (Signed)
Patient discharged in stable condition via wheelchair. Discharge instructions and prescriptions were given and explained 

## 2012-12-28 NOTE — Plan of Care (Signed)
Problem: Phase II Progression Outcomes Goal: Discharge plan established No follow up OT recommended at this time     

## 2012-12-28 NOTE — Anesthesia Postprocedure Evaluation (Signed)
  Anesthesia Post-op Note  Patient: David Weaver  Procedure(s) Performed: Procedure(s) (LRB) with comments: TOTAL KNEE ARTHROPLASTY (Right) - RIGHT ARTHROPLASTY KNEE MEDIAL AND LATERAL COMPARTMENTS WITH PATELLA RESURFACING, RIGHT TOTAL KNEE REPLACEMENT  Patient Location: PACU  Anesthesia Type:GA combined with regional for post-op pain  Level of Consciousness: awake, alert , oriented and patient cooperative  Airway and Oxygen Therapy: Patient Spontanous Breathing  Post-op Pain: mild  Post-op Assessment: Post-op Vital signs reviewed, Patient's Cardiovascular Status Stable, Respiratory Function Stable, Patent Airway, No signs of Nausea or vomiting and Pain level controlled  Post-op Vital Signs: stable  Complications: No apparent anesthesia complications

## 2012-12-28 NOTE — Progress Notes (Signed)
Utilization review completed. Adrinne Sze, RN, BSN. 

## 2012-12-28 NOTE — Progress Notes (Signed)
Physical Therapy Treatment Patient Details Name: David Weaver MRN: [id number] DOB: 10-Jun-1951 Today's Date: 12/28/2012 Time: 4098-1191 PT Time Calculation (min): 30 min  PT Assessment / Plan / Recommendation Comments on Treatment Session  Patient progressing very well with ambulation and stairs. Plannin to DC this morning prior to weather getting bad.     Follow Up Recommendations  Home health PT;Supervision/Assistance - 24 hour     Does the patient have the potential to tolerate intense rehabilitation     Barriers to Discharge        Equipment Recommendations  None recommended by PT    Recommendations for Other Services    Frequency 7X/week   Plan Discharge plan remains appropriate;Frequency remains appropriate    Precautions / Restrictions Precautions Precautions: Knee Required Braces or Orthoses: Knee Immobilizer - Right Knee Immobilizer - Right: On except when in CPM;Discontinue once straight leg raise with < 10 degree lag   Pertinent Vitals/Pain     Mobility  Bed Mobility Bed Mobility: Not assessed Transfers Sit to Stand: 5: Supervision;With upper extremity assist;From chair/3-in-1 Stand to Sit: 5: Supervision;With upper extremity assist;To chair/3-in-1 Details for Transfer Assistance: Cues for safe technique Ambulation/Gait Ambulation/Gait Assistance: 5: Supervision Ambulation Distance (Feet): 230 Feet Assistive device: Rolling walker Ambulation/Gait Assistance Details: Cues for RW positioning. Patient starting to ambulate with step through gait pattern Gait Pattern: Step-through pattern;Decreased stride length Stairs: Yes Stairs Assistance: 4: Min assist Stairs Assistance Details (indicate cue type and reason): A for RW. Cues for technique Stair Management Technique: No rails;Step to pattern;Backwards;With walker Number of Stairs: 2  (practiced twice)    Exercises Total Joint Exercises Ankle Circles/Pumps: AROM;Both;15 reps Quad Sets: AROM;Both;15  reps Short Arc Quad: 15 reps;AROM;Right Heel Slides: AAROM;15 reps Hip ABduction/ADduction: 15 reps;AROM;Right Straight Leg Raises: 15 reps;AROM;Right   PT Diagnosis:    PT Problem List:   PT Treatment Interventions:     PT Goals Acute Rehab PT Goals PT Goal: Sit to Stand - Progress: Met PT Goal: Ambulate - Progress: Met PT Goal: Up/Down Stairs - Progress: Met PT Goal: Perform Home Exercise Program - Progress: Progressing toward goal  Visit Information  Last PT Received On: 12/28/12 Assistance Needed: +1    Subjective Data      Cognition  Overall Cognitive Status: Appears within functional limits for tasks assessed/performed Arousal/Alertness: Awake/alert Orientation Level: Appears intact for tasks assessed Behavior During Session: [facility] for tasks performed    Balance     End of Session PT - End of Session Equipment Utilized During Treatment: Gait belt;Right knee immobilizer Activity Tolerance: Patient tolerated treatment well Patient left: in chair;with call bell/phone within reach Nurse Communication: Mobility status   GP     Fredrich Birks 12/28/2012, 8:45 AM 12/28/2012 Fredrich Birks PTA [phone] pager [phone] office

## 2012-12-28 NOTE — Evaluation (Signed)
Occupational Therapy Evaluation Patient Details Name: David Weaver MRN: [id number] DOB: 09-18-1951 Today's Date: 12/28/2012 Time: 4696-2952 OT Time Calculation (min): 18 min  OT Assessment / Plan / Recommendation Clinical Impression  Pt doing well and will have 24 hour assist at home. Pt requires no further acute or follow up OT at this time. All education completed, OT will sign off    OT Assessment  Patient does not need any further OT services    Follow Up Recommendations  No OT follow up    Barriers to Discharge      Equipment Recommendations  Tub/shower seat    Recommendations for Other Services    Frequency       Precautions / Restrictions Precautions Precautions: Knee;Fall Required Braces or Orthoses: Knee Immobilizer - Right Knee Immobilizer - Right: On except when in CPM Restrictions Weight Bearing Restrictions: Yes RLE Weight Bearing: Weight bearing as tolerated   Pertinent Vitals/Pain     ADL  Grooming: Performed;Wash/dry hands;Wash/dry face;Supervision/safety Where Assessed - Grooming: Supported standing Upper Body Bathing: Simulated;Set up;Supervision/safety Where Assessed - Upper Body Bathing: Unsupported sitting Lower Body Bathing: Simulated;Minimal assistance Where Assessed - Lower Body Bathing: Unsupported sitting;Supported sit to stand Upper Body Dressing: Performed;Supervision/safety;Set up Where Assessed - Upper Body Dressing: Unsupported sitting Lower Body Dressing: Performed;Minimal assistance Where Assessed - Lower Body Dressing: Unsupported sitting;Supported sit to stand Toilet Transfer: Performed;Supervision/safety Toilet Transfer Method: Other (comment) (ambulating from RW level) Acupuncturist: Regular height toilet;Grab bars Toileting - Clothing Manipulation and Hygiene: Min guard Where Assessed - Engineer, mining and Hygiene: Standing Equipment Used: Rolling walker;Sock aid;Gait belt;Long-handled sponge;Reacher      OT Diagnosis:    OT Problem List:   OT Treatment Interventions:     OT Goals    Visit Information  Last OT Received On: 12/28/12 Assistance Needed: +1    Subjective Data  Subjective: " My wife will be there to help me at home " Patient Stated Goal: To return home   Prior Functioning     Home Living Lives With: Spouse;Son Available Help at Discharge: Family;Available 24 hours/day Type of Home: House Home Access: Stairs to enter [employer] of Steps: 1 Entrance Stairs-Rails: None Home Layout: One level Bathroom Shower/Tub: Health visitor: Standard Bathroom Accessibility: Yes How Accessible: Accessible via walker Home Adaptive Equipment: Bedside commode/3-in-1;Straight cane;Walker - rolling Additional Comments: Has reacher Prior Function Level of Independence: Independent Able to Take Stairs?: Yes Driving: Yes Vocation: Part time employment Communication Communication: No difficulties Dominant Hand: Right         Vision/Perception Vision - Assessment Eye Alignment: Within Functional Limits Perception Perception: Within Functional Limits   Cognition  Overall Cognitive Status: Appears within functional limits for tasks assessed/performed Arousal/Alertness: Awake/alert Orientation Level: Appears intact for tasks assessed Behavior During Session: [facility] for tasks performed    Extremity/Trunk Assessment Right Upper Extremity Assessment RUE ROM/Strength/Tone: [facility] for tasks assessed Left Upper Extremity Assessment LUE ROM/Strength/Tone: [facility] for tasks assessed     Mobility Bed Mobility Bed Mobility: Supine to Sit;Sitting - Scoot to Edge of Bed;Sit to Supine Supine to Sit: 5: Supervision Sitting - Scoot to Edge of Bed: 5: Supervision Sit to Supine: 5: Supervision Transfers Transfers: Sit to Stand;Stand to Sit Sit to Stand: 5: Supervision Stand to Sit: 5: Supervision Details for Transfer Assistance: Cues for safe technique      Shoulder Instructions        Balance Balance Balance Assessed: No   End of Session OT -  End of Session Equipment Utilized During Treatment: Gait belt;Other (comment) (ADL A/E) Activity Tolerance: Patient tolerated treatment well Patient left: in bed;with call bell/phone within reach  GO     David Weaver 12/28/2012, 9:47 AM

## 2012-12-29 MED ORDER — HYDROMORPHONE HCL PF 1 MG/ML IJ SOLN
INTRAMUSCULAR | Status: AC
  Filled 2012-12-29: qty 2

## 2012-12-29 NOTE — Discharge Summary (Signed)
Patient ID: David Weaver MRN: [id number] DOB/AGE: 1950-12-25 62 y.o.  Admit date: 2013-01-25 Discharge date: 12/29/2012  Admission Diagnoses:  Principal Problem:  *Right knee DJD Active Problems:  Essential hypertension, benign  Depression  Vitamin D deficiency  Insomnia  Osteoarthrosis, unspecified whether generalized or localized, lower leg  Tobacco use disorder  Alcohol abuse, daily use  Lupus erythematosus  Unspecified hearing loss   Discharge Diagnoses:  Same  Past Medical History  Diagnosis Date  . Other abnormal glucose   . Allergic rhinitis, cause unspecified   . Osteoarthrosis, unspecified whether generalized or localized, lower leg   . Other specified disorder of male genital organs   . Other malaise and fatigue   . Tobacco use disorder     quit smokeing 2012  . Alcohol abuse, daily use   . Essential hypertension, benign   . Dysthymic disorder   . Esophageal reflux   . Impotence of organic origin   . Lupus erythematosus   . Unspecified hearing loss   . Insomnia, unspecified   . Unspecified vitamin D deficiency   . Lupus   . Right knee DJD   . Shortness of breath   . Fibromyalgia   . Lupus     Surgeries: Procedure(s): TOTAL KNEE ARTHROPLASTY on Jan 25, 2013   Consultants:    Discharged Condition: Improved  Hospital Course: David Weaver is an 62 y.o. male who was admitted 01/25/13 for operative treatment ofRight knee DJD. Patient has severe unremitting pain that affects sleep, daily activities, and work/hobbies. After pre-op clearance the patient was taken to the operating room on Jan 25, 2013 and underwent  Procedure(s): TOTAL KNEE ARTHROPLASTY.    Patient was given perioperative antibiotics: Anti-infectives     Start     Dose/Rate Route Frequency Ordered Stop   2013-01-25 2200   hydroxychloroquine (PLAQUENIL) tablet 200 mg  Status:  Discontinued        200 mg Oral 2 times daily 2013/01/25 1552 12/28/12 1425   01/25/13 1600   ceFAZolin (ANCEF) IVPB 2  g/50 mL premix        2 g 100 mL/hr over 30 Minutes Intravenous Every 6 hours 01/25/13 1552 12/28/12 0158   Jan 25, 2013 1141   cefUROXime (ZINACEF) injection  Status:  Discontinued          As needed 2013/01/25 1101 2013/01/25 1339   01-25-2013 0600   ceFAZolin (ANCEF) IVPB 2 g/50 mL premix        2 g 100 mL/hr over 30 Minutes Intravenous On call to O.R. 12/26/12 1338 Jan 25, 2013 1115           Patient was given sequential compression devices, early ambulation, and chemoprophylaxis to prevent DVT.  Patient benefited maximally from hospital stay and there were no complications.    Recent vital signs: No data found.    Recent laboratory studies:  Basename 12/28/12 0543 2013/01/25 1556  WBC 11.1* 9.2  HGB 13.4 14.2  HCT 38.9* 40.4  PLT 233 237  NA 139 --  K 3.5 --  CL 102 --  CO2 23 --  BUN 12 --  CREATININE 0.56 0.64  GLUCOSE 183* --  INR -- --  CALCIUM 8.5 --     Discharge Medications:     Medication List     As of 12/29/2012  7:18 AM    STOP taking these medications         aspirin 81 MG chewable tablet      oxyCODONE-acetaminophen 5-325 MG per tablet   Commonly  known as: PERCOCET/ROXICET      TAKE these medications         acetaminophen 325 MG tablet   Commonly known as: TYLENOL   Take 2 tablets (650 mg total) by mouth every 6 (six) hours as needed for pain (or Fever >/= 101).      amLODipine 10 MG tablet   Commonly known as: NORVASC   Take 10 mg by mouth daily.      celecoxib 200 MG capsule   Commonly known as: CELEBREX   Take 1 capsule (200 mg total) by mouth daily.      DSS 100 MG Caps   Take 100 mg by mouth 2 (two) times daily.      DULoxetine 60 MG capsule   Commonly known as: CYMBALTA   Take 60 mg by mouth 2 (two) times daily.      enoxaparin 30 MG/0.3ML injection   Commonly known as: LOVENOX   Inject 0.3 mLs (30 mg total) into the skin every 12 (twelve) hours.      omeprazole 40 MG capsule   Commonly known as: PRILOSEC   Take 40 mg by mouth  daily.      oxyCODONE 15 MG immediate release tablet   Commonly known as: ROXICODONE   1 tablets every 3-4 hrs as needed for pain      PLAQUENIL 200 MG tablet   Generic drug: hydroxychloroquine   Take 200 mg by mouth 2 (two) times daily.      tadalafil 20 MG tablet   Commonly known as: CIALIS   Take 20 mg by mouth daily as needed.      Vitamin D (Ergocalciferol) 50000 UNITS Caps   Commonly known as: DRISDOL   Take 50,000 Units by mouth every 7 (seven) days.        Diagnostic Studies: Dg Chest 2 View  12/20/2012  *RADIOLOGY REPORT*  Clinical Data: Osteoarthritis of the right knee.  Preoperative respiratory exam.  CHEST - 2 VIEW  Comparison: 11/01/2012  Findings: Heart size and pulmonary vascularity are normal and the lungs are clear.  No osseous abnormality.  IMPRESSION: Normal chest.   Original Report Authenticated By: Francene Boyers, M.D.     Disposition: 01-Home or Self Care      Discharge Orders    Future Appointments: Provider: Department: Dept Phone: Center:   02/28/2013 9:30 AM Ethelda Chick, MD [facility] [phone] [id number]     Future Orders Please Complete By Expires   Diet - low sodium heart healthy      Call MD / Call 911      Comments:   If you experience chest pain or shortness of breath, CALL 911 and be transported to the hospital emergency room.  If you develope a fever above 101 F, pus (white drainage) or increased drainage or redness at the wound, or calf pain, call your surgeon's office.   Discharge instructions      Comments:   Total Knee Replacement Care After Refer to this sheet in the next few weeks. These discharge instructions provide you with general information on caring for yourself after you leave the hospital. Your caregiver may also give you specific instructions. Your treatment has been planned according to the most current medical practices available, but unavoidable complications sometimes occur. If you have any problems or  questions after discharge, please call your caregiver. Regaining a near full range of motion of your knee within the first 3 to 6 weeks after surgery  is critical. HOME CARE INSTRUCTIONS  You may resume a normal diet and activities as directed.  Perform exercises as directed.  Place yellow foam block, yellow side up under heel at all times except when in CPM or when walking.  DO NOT modify, tear, cut, or change in any way. You will receive physical therapy daily  Take showers instead of baths until informed otherwise.  Change bandages (dressings)daily Do not take over-the-counter or prescription medicines for pain, discomfort, or fever. Eat a well-balanced diet.  Avoid lifting or driving until you are instructed otherwise.  Make an appointment to see your caregiver for stitches (suture) or staple removal as directed.  If you have been sent home with a continuous passive motion machine (CPM machine), 0-90 degrees 6 hrs a day   2 hrs a shift SEEK MEDICAL CARE IF: You have swelling of your calf or leg.  You develop shortness of breath or chest pain.  You have redness, swelling, or increasing pain in the wound.  There is pus or any unusual drainage coming from the surgical site.  You notice a bad smell coming from the surgical site or dressing.  The surgical site breaks open after sutures or staples have been removed.  There is persistent bleeding from the suture or staple line.  You are getting worse or are not improving.  You have any other questions or concerns.  SEEK IMMEDIATE MEDICAL CARE IF:  You have a fever.  You develop a rash.  You have difficulty breathing.  You develop any reaction or side effects to medicines given.  Your knee motion is decreasing rather than improving.  MAKE SURE YOU:  Understand these instructions.  Will watch your condition.  Will get help right away if you are not doing well or get worse.   Constipation Prevention      Comments:   Drink plenty of  fluids.  Prune juice may be helpful.  You may use a stool softener, such as Colace (over the counter) 100 mg twice a day.  Use MiraLax (over the counter) for constipation as needed.   Increase activity slowly as tolerated      CPM      Comments:   Continuous passive motion machine (CPM):      Use the CPM from 0 to 90 for 6 hours per day.       You may break it up into 2 or 3 sessions per day.      Use CPM for 2 weeks or until you are told to stop.   TED hose      Comments:   Use stockings (TED hose) for 2 weeks on both leg(s).  You may remove them at night for sleeping.   Change dressing      Comments:   Change the dressing daily with sterile 4 x 4 inch gauze dressing and apply TED hose.  You may clean the incision with alcohol prior to redressing.   Do not put a pillow under the knee. Place it under the heel.      Comments:   Place yellow foam block, yellow side up under heel at all times except when in CPM or when walking.  DO NOT modify, tear, cut, or change in any way the yellow foam block.      Follow-up Information    Follow up with Nilda Simmer, MD. On 01/10/2013. (appt time 2 pm)    Contact information:   [address] [place]  [place] [address] [phone]           Signed: Pascal Lux 12/29/2012, 7:18 AM

## 2013-01-19 ENCOUNTER — Encounter: Payer: Self-pay | Admitting: Physician Assistant

## 2013-01-19 ENCOUNTER — Other Ambulatory Visit: Payer: Self-pay | Admitting: Physician Assistant

## 2013-01-19 DIAGNOSIS — J309 Allergic rhinitis, unspecified: Secondary | ICD-10-CM | POA: Insufficient documentation

## 2013-01-19 DIAGNOSIS — M1712 Unilateral primary osteoarthritis, left knee: Secondary | ICD-10-CM | POA: Insufficient documentation

## 2013-01-19 NOTE — H&P (Signed)
TOTAL KNEE ADMISSION H&P  Patient is being admitted for left total knee arthroplasty.  Subjective:  Chief Complaint:left knee pain.  HPI: David Weaver, 62 y.o. male, has a history of pain and functional disability in the left knee due to arthritis and has failed non-surgical conservative treatments for greater than 12 weeks to includeNSAID's and/or analgesics, corticosteriod injections, flexibility and strengthening excercises, supervised PT with diminished ADL's post treatment, use of assistive devices, weight reduction as appropriate and activity modification.  Onset of symptoms was gradual, starting 7 years ago with gradually worsening course since that time. The patient noted no past surgery on the left knee(s).  Patient currently rates pain in the left knee(s) at 8 out of 10 with activity. Patient has night pain, worsening of pain with activity and weight bearing, pain that interferes with activities of daily living, crepitus and joint swelling.  Patient has evidence of subchondral sclerosis, periarticular osteophytes and joint space narrowing by imaging studies. . There is no active infection.  Patient Active Problem List   Diagnosis Date Noted  . Left knee DJD   . Allergic rhinitis, cause unspecified   . Osteoarthrosis, unspecified whether generalized or localized, lower leg   . Tobacco use disorder   . Alcohol abuse, daily use   . Esophageal reflux   . Lupus erythematosus   . Unspecified hearing loss   . Insomnia, unspecified   . Right knee DJD   . Unspecified vitamin D deficiency   . Essential hypertension, benign 11/01/2012  . Other abnormal glucose 11/01/2012  . Pre-operative clearance 11/01/2012  . Depression 11/01/2012  . Need for prophylactic vaccination and inoculation against influenza 11/01/2012  . Vitamin D deficiency 11/01/2012  . Insomnia 11/01/2012   Past Medical History  Diagnosis Date  . Other abnormal glucose   . Allergic rhinitis, cause unspecified   .  Osteoarthrosis, unspecified whether generalized or localized, lower leg   . Other specified disorder of male genital organs   . Other malaise and fatigue   . Tobacco use disorder     quit smokeing 2012  . Alcohol abuse, daily use   . Essential hypertension, benign   . Dysthymic disorder   . Esophageal reflux   . Impotence of organic origin   . Lupus erythematosus   . Unspecified hearing loss   . Insomnia, unspecified   . Unspecified vitamin D deficiency   . Left knee DJD   . Shortness of breath   . Fibromyalgia     Past Surgical History  Procedure Laterality Date  . Cholecystectomy    . Carpal tunnel release      bilateral  . Ganglion cyst excision  1980    Left wrist  . Knee arthroscopy  2008    Right  . Lumbar spine epidural  2009    [facility]  . Rotator cuff repair  10/2008    right  . Total knee arthroplasty  12/27/2012    Procedure: TOTAL KNEE ARTHROPLASTY;  Surgeon: Nilda Simmer, MD;  Location: [facility];  Service: Orthopedics;  Laterality: Right;  RIGHT ARTHROPLASTY KNEE MEDIAL AND LATERAL COMPARTMENTS WITH PATELLA RESURFACING, RIGHT TOTAL KNEE REPLACEMENT     (Not in a hospital admission) No Known Allergies   Current Outpatient Prescriptions on File Prior to Visit  Medication Sig Dispense Refill  . acetaminophen (TYLENOL) 325 MG tablet Take 2 tablets (650 mg total) by mouth every 6 (six) hours as needed for pain (or Fever >/= 101).      [name]  amLODipine (NORVASC) 10 MG tablet Take 10 mg by mouth daily.      . celecoxib (CELEBREX) 200 MG capsule Take 1 capsule (200 mg total) by mouth daily.  30 capsule  3  . docusate sodium 100 MG CAPS Take 100 mg by mouth 2 (two) times daily.  60 capsule  0  . DULoxetine (CYMBALTA) 60 MG capsule Take 60 mg by mouth 2 (two) times daily.      [name] enoxaparin (LOVENOX) 30 MG/0.3ML injection Inject 0.3 mLs (30 mg total) into the skin every 12 (twelve) hours.  30 Syringe  0  . hydroxychloroquine (PLAQUENIL) 200 MG tablet Take 200 mg  by mouth 2 (two) times daily.      [name] omeprazole (PRILOSEC) 40 MG capsule Take 40 mg by mouth daily.      [name] oxyCODONE (ROXICODONE) 15 MG immediate release tablet 1 tablets every 3-4 hrs as needed for pain  100 tablet  0  . tadalafil (CIALIS) 20 MG tablet Take 20 mg by mouth daily as needed.      . Vitamin D, Ergocalciferol, (DRISDOL) 50000 UNITS CAPS Take 50,000 Units by mouth every 7 (seven) days.       No current facility-administered medications on file prior to visit.    History  Substance Use Topics  . Smoking status: Former Smoker -- 2.00 packs/day for 30 years    Types: Cigarettes    Quit date: 12/28/2008  . Smokeless tobacco: Never Used     Comment: quit 12 /2011  . Alcohol Use: 5.6 oz/week    6 Cans of beer, 4 Drinks containing 0.5 oz of alcohol per week     Comment: 2 drinks of liquor, every afternoon    12/28/2012  wekends 4 5 beers    Family History  Problem Relation Age of Onset  . Alzheimer's disease Mother   . Osteoporosis Mother   . Heart disease Father   . Alcohol abuse Father   . Cancer Brother     colon  . Diabetes Brother      Review of Systems  Constitutional: Negative.   HENT: Negative.   Eyes: Negative.   Respiratory: Negative.   Cardiovascular: Negative.   Gastrointestinal: Negative.   Genitourinary: Negative.   Musculoskeletal:       Left knee pain   6 weeks s/p right total knee  Skin: Negative.   Neurological: Negative.   Endo/Heme/Allergies: Negative.     Objective:  Physical Exam  Constitutional: He is oriented to person, place, and time. He appears well-developed and well-nourished.  HENT:  Head: Normocephalic and atraumatic.  Eyes: Conjunctivae and EOM are normal. Pupils are equal, round, and reactive to light.  Neck: Neck supple.  Cardiovascular: Normal rate, regular rhythm and normal heart sounds.  Exam reveals no gallop and no friction rub.   No murmur heard. Respiratory: Effort normal.  GI: Soft.  Genitourinary:  Not  pertinent to current symptomatology therefore not examined.  Musculoskeletal:  Examination of left knee reveals moderate varus deformity, diffuse pain, 1+ crepitation 1+ synovitis range of motion -5 to 125 degrees both knees are stable with normal patella tracking.  Right knee has a well healed total knee incision for total knee replacement 6 weeks ago Gait exam reveals significant antalgic gait with pain in both knees. Vascular exam: pulses 2+ and symmetric.  Neurological: He is alert and oriented to person, place, and time.  Skin: Skin is warm and dry.  Psychiatric: He has a normal mood and affect.  His behavior is normal.    Vital signs in last 24 hours: Last recorded: 02/19 1500   BP: 128/82 Pulse: 95  Temp: 97.8 F (36.6 C)    Height: 5\' 6"  (1.676 m) SpO2: 96  Weight: 81.647 kg (180 lb)     Labs:   Estimated body mass index is 29.07 kg/(m^2) as calculated from the following:   Height as of this encounter: 5\' 6"  (1.676 m).   Weight as of this encounter: 81.647 kg (180 lb).   Imaging Review Plain radiographs demonstrate severe degenerative joint disease of the left knee(s). The overall alignment issignificant varus. The bone quality appears to be good for age and reported activity level.  Assessment/Plan:  End stage arthritis, left knee  Patient Active Problem List  Diagnosis  . Essential hypertension, benign  . Other abnormal glucose  . Pre-operative clearance  . Depression  . Need for prophylactic vaccination and inoculation against influenza  . Vitamin D deficiency  . Insomnia  . Osteoarthrosis, unspecified whether generalized or localized, lower leg  . Tobacco use disorder  . Alcohol abuse, daily use  . Esophageal reflux  . Lupus erythematosus  . Unspecified hearing loss  . Insomnia, unspecified  . Right knee DJD  . Unspecified vitamin D deficiency  . Left knee DJD  . Allergic rhinitis, cause unspecified   The patient history, physical examination, clinical  judgment of the provider and imaging studies are consistent with end stage degenerative joint disease of the left knee(s) and total knee arthroplasty is deemed medically necessary. The treatment options including medical management, injection therapy arthroscopy and arthroplasty were discussed at length. The risks and benefits of total knee arthroplasty were presented and reviewed. The risks due to aseptic loosening, infection, stiffness, patella tracking problems, thromboembolic complications and other imponderables were discussed. The patient acknowledged the explanation, agreed to proceed with the plan and consent was signed. Patient is being admitted for inpatient treatment for surgery, pain control, PT, OT, prophylactic antibiotics, VTE prophylaxis, progressive ambulation and ADL's and discharge planning. The patient is planning to be discharged home with home health services  Chloe Bluett A. Gwinda Passe Physician Assistant [facility] [phone]  01/19/2013, 3:26 PM

## 2013-01-24 ENCOUNTER — Encounter (HOSPITAL_COMMUNITY): Payer: Self-pay | Admitting: Pharmacy Technician

## 2013-01-24 NOTE — Pre-Procedure Instructions (Signed)
David Weaver  01/24/2013   Your procedure is scheduled on:  Monday, March 3rd.   Report to [facility] at  8:00 AM.  Call this number if you have problems the morning of surgery: [phone]   Remember:   Do not eat food or drink liquids after midnight Sunday.   Take these medicines the morning of surgery with A SIP OF WATER: Norvasc, Cymbalta, Prilosec, Pain Pill   Do not wear jewelry.  Do not wear lotions, powders, or colognes. You may NOT wear deodorant.   Men may shave face and neck.   Do not bring valuables to the hospital.  Contacts, dentures or bridgework may not be worn into surgery.   Leave suitcase in the car. After surgery it may be brought to your room.  For patients admitted to the hospital, checkout time is 11:00 AM the day of discharge.   Patients discharged the day of surgery will not be allowed to drive home.   Name and phone number of your driver:    Special Instructions: Shower using CHG 2 nights before surgery and the night before surgery.  If you shower the day of surgery use CHG.  Use special wash - you have one bottle of CHG for all showers.  You should use approximately 1/3 of the bottle for each shower.   Please read over the following fact sheets that you were given: Pain Booklet, Coughing and Deep Breathing, Blood Transfusion Information, MRSA Information and Surgical Site Infection Prevention

## 2013-01-25 ENCOUNTER — Telehealth: Payer: Self-pay | Admitting: Radiology

## 2013-01-25 ENCOUNTER — Encounter (HOSPITAL_COMMUNITY)
Admission: RE | Admit: 2013-01-25 | Discharge: 2013-01-25 | Disposition: A | Discharge status: Home / Self Care | Payer: BC Managed Care – PPO | Source: Ambulatory Visit | Attending: Orthopedic Surgery | Admitting: Orthopedic Surgery

## 2013-01-25 ENCOUNTER — Encounter (HOSPITAL_COMMUNITY): Payer: Self-pay

## 2013-01-25 DIAGNOSIS — Z01812 Encounter for preprocedural laboratory examination: Secondary | ICD-10-CM | POA: Insufficient documentation

## 2013-01-25 LAB — CBC WITH DIFFERENTIAL/PLATELET
Basophils Relative: 0 % (ref 0–1)
Eosinophils Absolute: 0.2 10*3/uL (ref 0.0–0.7)
Lymphs Abs: 1.4 10*3/uL (ref 0.7–4.0)
MCH: 28.8 pg (ref 26.0–34.0)
Neutrophils Relative %: 68 % (ref 43–77)
Platelets: 316 10*3/uL (ref 150–400)
RBC: 4.79 MIL/uL (ref 4.22–5.81)

## 2013-01-25 LAB — URINALYSIS, ROUTINE W REFLEX MICROSCOPIC
Nitrite: NEGATIVE
Specific Gravity, Urine: 1.019 (ref 1.005–1.030)
Urobilinogen, UA: 1 mg/dL (ref 0.0–1.0)

## 2013-01-25 LAB — COMPREHENSIVE METABOLIC PANEL
ALT: 60 U/L — ABNORMAL HIGH (ref 0–53)
Albumin: 3.7 g/dL (ref 3.5–5.2)
Alkaline Phosphatase: 152 U/L — ABNORMAL HIGH (ref 39–117)
Glucose, Bld: 120 mg/dL — ABNORMAL HIGH (ref 70–99)
Potassium: 3.4 mEq/L — ABNORMAL LOW (ref 3.5–5.1)
Sodium: 138 mEq/L (ref 135–145)
Total Protein: 6.4 g/dL (ref 6.0–8.3)

## 2013-01-25 LAB — TYPE AND SCREEN: Antibody Screen: NEGATIVE

## 2013-01-25 LAB — APTT: aPTT: 28 seconds (ref 24–37)

## 2013-01-25 LAB — SURGICAL PCR SCREEN
MRSA, PCR: NEGATIVE
Staphylococcus aureus: NEGATIVE

## 2013-01-25 MED ORDER — CHLORHEXIDINE GLUCONATE 4 % EX LIQD: 60.0000 mL | Freq: Once | CUTANEOUS | Status: DC

## 2013-01-25 NOTE — Telephone Encounter (Signed)
Spoke with [place] PA with Dr. Thurston Hole.  Has been taking narcotics for past month s/p TKR one month ago.  Performed pre-operative labs this week for other TKR; LFTs slightly elevated.  A/P:  Mild elevation of LFTs:  With recent narcotic use for post-operative pain.  Proceed with surgery next week; check LFTs day of surgery, one week post-operative. Schedule for follow-up with PCP in upcoming 3-4 weeks.  [place], PA agreeable with plan.  Pt has scheduled CPE with PCP/[place] 02/28/13 which will be one month post-operative; keep appointment at that time.

## 2013-01-25 NOTE — Telephone Encounter (Signed)
Pt has total knee replacement scheduled for next week with Dr Thurston Hole. His labs were abnormal. His liver functions were VERY abnormal. Please call  [phone] Kierstin PA with Dr Thurston Hole.

## 2013-01-26 LAB — URINE CULTURE

## 2013-01-29 HISTORY — PX: JOINT REPLACEMENT: SHX530

## 2013-01-30 MED ORDER — CEFAZOLIN SODIUM-DEXTROSE 2-3 GM-% IV SOLR
2.0000 g | INTRAVENOUS | Status: AC
  Administered 2013-01-31: 2 g via INTRAVENOUS
  Filled 2013-01-30: qty 50

## 2013-01-31 ENCOUNTER — Inpatient Hospital Stay (HOSPITAL_COMMUNITY)
Admission: RE | Admit: 2013-01-31 | Discharge: 2013-02-01 | DRG: 209 | Disposition: A | Discharge status: Home Health | Payer: BC Managed Care – PPO | Source: Ambulatory Visit | Attending: Orthopedic Surgery | Admitting: Orthopedic Surgery

## 2013-01-31 ENCOUNTER — Encounter (HOSPITAL_COMMUNITY)
Admission: RE | Disposition: A | Discharge status: Home Health | Payer: Self-pay | Source: Ambulatory Visit | Attending: Orthopedic Surgery

## 2013-01-31 ENCOUNTER — Encounter (HOSPITAL_COMMUNITY): Payer: Self-pay | Admitting: Anesthesiology

## 2013-01-31 ENCOUNTER — Inpatient Hospital Stay (HOSPITAL_COMMUNITY): Payer: BC Managed Care – PPO | Admitting: Anesthesiology

## 2013-01-31 ENCOUNTER — Encounter (HOSPITAL_COMMUNITY): Payer: Self-pay | Admitting: *Deleted

## 2013-01-31 DIAGNOSIS — M1710 Unilateral primary osteoarthritis, unspecified knee: Principal | ICD-10-CM | POA: Diagnosis present

## 2013-01-31 DIAGNOSIS — M1712 Unilateral primary osteoarthritis, left knee: Secondary | ICD-10-CM

## 2013-01-31 DIAGNOSIS — IMO0001 Reserved for inherently not codable concepts without codable children: Secondary | ICD-10-CM | POA: Diagnosis present

## 2013-01-31 DIAGNOSIS — Z96659 Presence of unspecified artificial knee joint: Secondary | ICD-10-CM

## 2013-01-31 DIAGNOSIS — I10 Essential (primary) hypertension: Secondary | ICD-10-CM | POA: Diagnosis present

## 2013-01-31 DIAGNOSIS — F1010 Alcohol abuse, uncomplicated: Secondary | ICD-10-CM | POA: Diagnosis present

## 2013-01-31 DIAGNOSIS — E559 Vitamin D deficiency, unspecified: Secondary | ICD-10-CM | POA: Diagnosis present

## 2013-01-31 DIAGNOSIS — M329 Systemic lupus erythematosus, unspecified: Secondary | ICD-10-CM | POA: Diagnosis present

## 2013-01-31 DIAGNOSIS — F329 Major depressive disorder, single episode, unspecified: Secondary | ICD-10-CM | POA: Diagnosis present

## 2013-01-31 DIAGNOSIS — J309 Allergic rhinitis, unspecified: Secondary | ICD-10-CM | POA: Diagnosis present

## 2013-01-31 DIAGNOSIS — Z9089 Acquired absence of other organs: Secondary | ICD-10-CM

## 2013-01-31 DIAGNOSIS — M359 Systemic involvement of connective tissue, unspecified: Secondary | ICD-10-CM | POA: Diagnosis present

## 2013-01-31 DIAGNOSIS — G4700 Insomnia, unspecified: Secondary | ICD-10-CM | POA: Diagnosis present

## 2013-01-31 DIAGNOSIS — Z7901 Long term (current) use of anticoagulants: Secondary | ICD-10-CM

## 2013-01-31 DIAGNOSIS — F3289 Other specified depressive episodes: Secondary | ICD-10-CM | POA: Diagnosis present

## 2013-01-31 DIAGNOSIS — Z79899 Other long term (current) drug therapy: Secondary | ICD-10-CM

## 2013-01-31 DIAGNOSIS — F32A Depression, unspecified: Secondary | ICD-10-CM | POA: Diagnosis present

## 2013-01-31 DIAGNOSIS — H9190 Unspecified hearing loss, unspecified ear: Secondary | ICD-10-CM | POA: Diagnosis present

## 2013-01-31 DIAGNOSIS — Z87891 Personal history of nicotine dependence: Secondary | ICD-10-CM

## 2013-01-31 DIAGNOSIS — K219 Gastro-esophageal reflux disease without esophagitis: Secondary | ICD-10-CM | POA: Diagnosis present

## 2013-01-31 HISTORY — PX: TOTAL KNEE ARTHROPLASTY: SHX125

## 2013-01-31 LAB — CREATININE, SERUM: GFR calc Af Amer: >90 mL/min (ref >90)

## 2013-01-31 LAB — CBC
MCH: 28.9 pg (ref 26.0–34.0)
MCHC: 34.7 g/dL (ref 30.0–36.0)
Platelets: 260 10*3/uL (ref 150–400)
RBC: 4.92 MIL/uL (ref 4.22–5.81)

## 2013-01-31 SURGERY — ARTHROPLASTY, KNEE, TOTAL
Anesthesia: General | Site: Knee | Laterality: Left | Wound class: Clean

## 2013-01-31 MED ORDER — HYDROMORPHONE HCL PF 1 MG/ML IJ SOLN
0.5000 mg | INTRAMUSCULAR | Status: DC | PRN
  Administered 2013-01-31: 1 mg via INTRAVENOUS
  Filled 2013-01-31: qty 1

## 2013-01-31 MED ORDER — ONDANSETRON HCL 4 MG/2ML IJ SOLN
INTRAMUSCULAR | Status: DC | PRN
  Administered 2013-01-31: 4 mg via INTRAVENOUS

## 2013-01-31 MED ORDER — LORAZEPAM 2 MG/ML IJ SOLN: 1.0000 mg | Freq: Four times a day (QID) | INTRAMUSCULAR | Status: DC | PRN

## 2013-01-31 MED ORDER — VITAMIN D (ERGOCALCIFEROL) 1.25 MG (50000 UNIT) PO CAPS: 50000.0000 [IU] | ORAL_CAPSULE | ORAL | Status: DC

## 2013-01-31 MED ORDER — DEXAMETHASONE SODIUM PHOSPHATE 10 MG/ML IJ SOLN
10.0000 mg | Freq: Three times a day (TID) | INTRAMUSCULAR | Status: AC
  Administered 2013-01-31: 10 mg via INTRAVENOUS
  Filled 2013-01-31 (×4): qty 1

## 2013-01-31 MED ORDER — ACETAMINOPHEN 10 MG/ML IV SOLN
1000.0000 mg | Freq: Four times a day (QID) | INTRAVENOUS | Status: AC
  Administered 2013-01-31 – 2013-02-01 (×4): 1000 mg via INTRAVENOUS
  Filled 2013-01-31 (×4): qty 100

## 2013-01-31 MED ORDER — PROPOFOL 10 MG/ML IV BOLUS
INTRAVENOUS | Status: DC | PRN
  Administered 2013-01-31: 50 mg via INTRAVENOUS
  Administered 2013-01-31: 150 mg via INTRAVENOUS

## 2013-01-31 MED ORDER — MORPHINE SULFATE 10 MG/ML IJ SOLN
INTRAMUSCULAR | Status: DC | PRN
  Administered 2013-01-31: 2 mg via INTRAVENOUS
  Administered 2013-01-31 (×2): 4 mg via INTRAVENOUS

## 2013-01-31 MED ORDER — LORAZEPAM 1 MG PO TABS: 1.0000 mg | ORAL_TABLET | Freq: Four times a day (QID) | ORAL | Status: DC | PRN

## 2013-01-31 MED ORDER — METOCLOPRAMIDE HCL 10 MG PO TABS: 5.0000 mg | ORAL_TABLET | Freq: Three times a day (TID) | ORAL | Status: DC | PRN

## 2013-01-31 MED ORDER — ONDANSETRON HCL 4 MG/2ML IJ SOLN: 4.0000 mg | Freq: Four times a day (QID) | INTRAMUSCULAR | Status: DC | PRN

## 2013-01-31 MED ORDER — SODIUM CHLORIDE 0.9 % IR SOLN
Status: DC | PRN
  Administered 2013-01-31: 1000 mL
  Administered 2013-01-31: 3000 mL

## 2013-01-31 MED ORDER — OXYCODONE HCL 5 MG/5ML PO SOLN: 5.0000 mg | Freq: Once | ORAL | Status: DC | PRN

## 2013-01-31 MED ORDER — VECURONIUM BROMIDE 10 MG IV SOLR
INTRAVENOUS | Status: DC | PRN
  Administered 2013-01-31: 7 mg via INTRAVENOUS

## 2013-01-31 MED ORDER — DULOXETINE HCL 60 MG PO CPEP
60.0000 mg | ORAL_CAPSULE | Freq: Two times a day (BID) | ORAL | Status: DC
  Administered 2013-01-31 – 2013-02-01 (×2): 60 mg via ORAL
  Filled 2013-01-31 (×3): qty 1

## 2013-01-31 MED ORDER — LIDOCAINE HCL (CARDIAC) 20 MG/ML IV SOLN
INTRAVENOUS | Status: DC | PRN
  Administered 2013-01-31: 20 mg via INTRAVENOUS

## 2013-01-31 MED ORDER — CEFUROXIME SODIUM 1.5 G IJ SOLR
INTRAMUSCULAR | Status: DC | PRN
  Administered 2013-01-31: 1.5 g

## 2013-01-31 MED ORDER — ARTIFICIAL TEARS OP OINT
TOPICAL_OINTMENT | OPHTHALMIC | Status: DC | PRN
  Administered 2013-01-31: 1 via OPHTHALMIC

## 2013-01-31 MED ORDER — MIDAZOLAM HCL 2 MG/2ML IJ SOLN
INTRAMUSCULAR | Status: AC
  Administered 2013-01-31: 2 mg
  Filled 2013-01-31: qty 2

## 2013-01-31 MED ORDER — ACETAMINOPHEN 325 MG PO TABS: 650.0000 mg | ORAL_TABLET | Freq: Four times a day (QID) | ORAL | Status: DC | PRN

## 2013-01-31 MED ORDER — PANTOPRAZOLE SODIUM 40 MG PO TBEC
80.0000 mg | DELAYED_RELEASE_TABLET | Freq: Every day | ORAL | Status: DC
  Administered 2013-02-01: 80 mg via ORAL
  Filled 2013-01-31: qty 2

## 2013-01-31 MED ORDER — CELECOXIB 200 MG PO CAPS
200.0000 mg | ORAL_CAPSULE | Freq: Two times a day (BID) | ORAL | Status: DC
  Administered 2013-01-31 – 2013-02-01 (×3): 200 mg via ORAL
  Filled 2013-01-31 (×5): qty 1

## 2013-01-31 MED ORDER — ZOLPIDEM TARTRATE 5 MG PO TABS: 5.0000 mg | ORAL_TABLET | Freq: Every evening | ORAL | Status: DC | PRN

## 2013-01-31 MED ORDER — DOCUSATE SODIUM 100 MG PO CAPS
100.0000 mg | ORAL_CAPSULE | Freq: Two times a day (BID) | ORAL | Status: DC
  Administered 2013-01-31 – 2013-02-01 (×3): 100 mg via ORAL
  Filled 2013-01-31 (×3): qty 1

## 2013-01-31 MED ORDER — ONDANSETRON HCL 4 MG PO TABS: 4.0000 mg | ORAL_TABLET | Freq: Four times a day (QID) | ORAL | Status: DC | PRN

## 2013-01-31 MED ORDER — METOCLOPRAMIDE HCL 5 MG/ML IJ SOLN: 5.0000 mg | Freq: Three times a day (TID) | INTRAMUSCULAR | Status: DC | PRN

## 2013-01-31 MED ORDER — ALUM & MAG HYDROXIDE-SIMETH 200-200-20 MG/5ML PO SUSP: 30.0000 mL | ORAL | Status: DC | PRN

## 2013-01-31 MED ORDER — FENTANYL CITRATE 0.05 MG/ML IJ SOLN
INTRAMUSCULAR | Status: DC | PRN
  Administered 2013-01-31: 50 ug via INTRAVENOUS
  Administered 2013-01-31: 250 ug via INTRAVENOUS
  Administered 2013-01-31 (×2): 50 ug via INTRAVENOUS

## 2013-01-31 MED ORDER — LACTATED RINGERS IV SOLN
INTRAVENOUS | Status: DC
  Administered 2013-01-31: 20 mL/h via INTRAVENOUS

## 2013-01-31 MED ORDER — AMLODIPINE BESYLATE 10 MG PO TABS
10.0000 mg | ORAL_TABLET | Freq: Every day | ORAL | Status: DC
  Administered 2013-02-01: 10 mg via ORAL
  Filled 2013-01-31: qty 1

## 2013-01-31 MED ORDER — DEXAMETHASONE 6 MG PO TABS
10.0000 mg | ORAL_TABLET | Freq: Three times a day (TID) | ORAL | Status: AC
  Administered 2013-01-31 – 2013-02-01 (×2): 10 mg via ORAL
  Filled 2013-01-31 (×3): qty 1

## 2013-01-31 MED ORDER — CEFUROXIME SODIUM 1.5 G IJ SOLR
INTRAMUSCULAR | Status: AC
  Filled 2013-01-31: qty 1.5

## 2013-01-31 MED ORDER — FENTANYL CITRATE 0.05 MG/ML IJ SOLN
INTRAMUSCULAR | Status: AC
  Administered 2013-01-31: 100 ug
  Filled 2013-01-31: qty 2

## 2013-01-31 MED ORDER — MIDAZOLAM HCL 2 MG/2ML IJ SOLN: 0.5000 mg | Freq: Once | INTRAMUSCULAR | Status: DC | PRN

## 2013-01-31 MED ORDER — BISACODYL 5 MG PO TBEC
10.0000 mg | DELAYED_RELEASE_TABLET | Freq: Every day | ORAL | Status: DC
  Administered 2013-01-31: 10 mg via ORAL
  Filled 2013-01-31: qty 2

## 2013-01-31 MED ORDER — PHENOL 1.4 % MT LIQD: 1.0000 | OROMUCOSAL | Status: DC | PRN

## 2013-01-31 MED ORDER — MENTHOL 3 MG MT LOZG: 1.0000 | LOZENGE | OROMUCOSAL | Status: DC | PRN

## 2013-01-31 MED ORDER — ENOXAPARIN SODIUM 30 MG/0.3ML ~~LOC~~ SOLN
30.0000 mg | Freq: Two times a day (BID) | SUBCUTANEOUS | Status: DC
  Administered 2013-02-01: 30 mg via SUBCUTANEOUS
  Filled 2013-01-31 (×3): qty 0.3

## 2013-01-31 MED ORDER — NEOSTIGMINE METHYLSULFATE 1 MG/ML IJ SOLN
INTRAMUSCULAR | Status: DC | PRN
  Administered 2013-01-31: 4 mg via INTRAVENOUS

## 2013-01-31 MED ORDER — OXYCODONE HCL 5 MG PO TABS: 5.0000 mg | ORAL_TABLET | Freq: Once | ORAL | Status: DC | PRN

## 2013-01-31 MED ORDER — LACTATED RINGERS IV SOLN
INTRAVENOUS | Status: DC | PRN
  Administered 2013-01-31 (×2): via INTRAVENOUS

## 2013-01-31 MED ORDER — POTASSIUM CHLORIDE IN NACL 20-0.9 MEQ/L-% IV SOLN
INTRAVENOUS | Status: DC
  Administered 2013-01-31 – 2013-02-01 (×2): via INTRAVENOUS
  Filled 2013-01-31 (×4): qty 1000

## 2013-01-31 MED ORDER — HYDROMORPHONE HCL PF 1 MG/ML IJ SOLN
INTRAMUSCULAR | Status: AC
  Filled 2013-01-31: qty 1

## 2013-01-31 MED ORDER — BUPIVACAINE-EPINEPHRINE 0.25% -1:200000 IJ SOLN
INTRAMUSCULAR | Status: DC | PRN
  Administered 2013-01-31: 30 mL

## 2013-01-31 MED ORDER — GLYCOPYRROLATE 0.2 MG/ML IJ SOLN
INTRAMUSCULAR | Status: DC | PRN
  Administered 2013-01-31: 0.6 mg via INTRAVENOUS

## 2013-01-31 MED ORDER — CEFAZOLIN SODIUM 1-5 GM-% IV SOLN
1.0000 g | Freq: Four times a day (QID) | INTRAVENOUS | Status: AC
  Administered 2013-01-31 (×2): 1 g via INTRAVENOUS
  Filled 2013-01-31 (×2): qty 50

## 2013-01-31 MED ORDER — OXYCODONE HCL 5 MG PO TABS
15.0000 mg | ORAL_TABLET | ORAL | Status: DC | PRN
  Administered 2013-01-31 (×2): 15 mg via ORAL
  Administered 2013-02-01: 30 mg via ORAL
  Administered 2013-02-01: 15 mg via ORAL
  Filled 2013-01-31 (×5): qty 3

## 2013-01-31 MED ORDER — PROMETHAZINE HCL 25 MG/ML IJ SOLN: 6.2500 mg | INTRAMUSCULAR | Status: DC | PRN

## 2013-01-31 MED ORDER — HYDROMORPHONE HCL PF 1 MG/ML IJ SOLN
0.2500 mg | INTRAMUSCULAR | Status: DC | PRN
  Administered 2013-01-31: 0.5 mg via INTRAVENOUS

## 2013-01-31 MED ORDER — DIPHENHYDRAMINE HCL 12.5 MG/5ML PO ELIX: 12.5000 mg | ORAL_SOLUTION | ORAL | Status: DC | PRN

## 2013-01-31 MED ORDER — ACETAMINOPHEN 650 MG RE SUPP: 650.0000 mg | Freq: Four times a day (QID) | RECTAL | Status: DC | PRN

## 2013-01-31 MED ORDER — MEPERIDINE HCL 25 MG/ML IJ SOLN: 6.2500 mg | INTRAMUSCULAR | Status: DC | PRN

## 2013-01-31 MED ORDER — POVIDONE-IODINE 7.5 % EX SOLN
Freq: Once | CUTANEOUS | Status: DC
  Filled 2013-01-31: qty 118

## 2013-01-31 MED ORDER — HYDROXYCHLOROQUINE SULFATE 200 MG PO TABS
200.0000 mg | ORAL_TABLET | Freq: Two times a day (BID) | ORAL | Status: DC
  Administered 2013-01-31 – 2013-02-01 (×3): 200 mg via ORAL
  Filled 2013-01-31 (×5): qty 1

## 2013-01-31 MED ORDER — BUPIVACAINE-EPINEPHRINE PF 0.5-1:200000 % IJ SOLN
INTRAMUSCULAR | Status: DC | PRN
  Administered 2013-01-31: 30 mL

## 2013-01-31 SURGICAL SUPPLY — 69 items
BANDAGE ESMARK 6X9 LF (GAUZE/BANDAGES/DRESSINGS) ×1 IMPLANT
BLADE SAGITTAL 25.0X1.19X90 (BLADE) ×2 IMPLANT
BLADE SAW SGTL 11.0X1.19X90.0M (BLADE) IMPLANT
BLADE SAW SGTL 13.0X1.19X90.0M (BLADE) ×2 IMPLANT
BLADE SURG 10 STRL SS (BLADE) ×4 IMPLANT
BNDG ELASTIC 6X15 VLCR STRL LF (GAUZE/BANDAGES/DRESSINGS) ×2 IMPLANT
BNDG ESMARK 6X9 LF (GAUZE/BANDAGES/DRESSINGS) ×2
BOWL SMART MIX CTS (DISPOSABLE) ×2 IMPLANT
CEMENT HV SMART SET (Cement) ×4 IMPLANT
CLOTH BEACON ORANGE TIMEOUT ST (SAFETY) ×2 IMPLANT
COVER SURGICAL LIGHT HANDLE (MISCELLANEOUS) ×2 IMPLANT
CUFF TOURNIQUET SINGLE 34IN LL (TOURNIQUET CUFF) ×2 IMPLANT
CUFF TOURNIQUET SINGLE 44IN (TOURNIQUET CUFF) IMPLANT
DRAPE EXTREMITY T 121X128X90 (DRAPE) ×2 IMPLANT
DRAPE INCISE IOBAN 66X45 STRL (DRAPES) ×2 IMPLANT
DRAPE PROXIMA HALF (DRAPES) ×2 IMPLANT
DRAPE U-SHAPE 47X51 STRL (DRAPES) ×2 IMPLANT
DRSG ADAPTIC 3X8 NADH LF (GAUZE/BANDAGES/DRESSINGS) ×2 IMPLANT
DRSG PAD ABDOMINAL 8X10 ST (GAUZE/BANDAGES/DRESSINGS) ×4 IMPLANT
DURAPREP 26ML APPLICATOR (WOUND CARE) ×2 IMPLANT
ELECT CAUTERY BLADE 6.4 (BLADE) ×2 IMPLANT
ELECT REM PT RETURN 9FT ADLT (ELECTROSURGICAL) ×2
ELECTRODE REM PT RTRN 9FT ADLT (ELECTROSURGICAL) ×1 IMPLANT
EVACUATOR 1/8 PVC DRAIN (DRAIN) ×2 IMPLANT
FACESHIELD LNG OPTICON STERILE (SAFETY) ×2 IMPLANT
GLOVE BIO SURGEON STRL SZ7 (GLOVE) ×2 IMPLANT
GLOVE BIO SURGEON STRL SZ7.5 (GLOVE) ×2 IMPLANT
GLOVE BIOGEL PI IND STRL 7.0 (GLOVE) ×1 IMPLANT
GLOVE BIOGEL PI IND STRL 7.5 (GLOVE) ×1 IMPLANT
GLOVE BIOGEL PI INDICATOR 7.0 (GLOVE) ×1
GLOVE BIOGEL PI INDICATOR 7.5 (GLOVE) ×1
GLOVE BIOGEL PI ORTHO PRO SZ7 (GLOVE) ×1
GLOVE PI ORTHO PRO STRL SZ7 (GLOVE) ×1 IMPLANT
GLOVE SS BIOGEL STRL SZ 7.5 (GLOVE) ×2 IMPLANT
GLOVE SUPERSENSE BIOGEL SZ 7.5 (GLOVE) ×2
GLOVE SURG SS PI 7.0 STRL IVOR (GLOVE) ×2 IMPLANT
GOWN PREVENTION PLUS XLARGE (GOWN DISPOSABLE) ×4 IMPLANT
GOWN STRL NON-REIN LRG LVL3 (GOWN DISPOSABLE) IMPLANT
GOWN STRL REIN XL XLG (GOWN DISPOSABLE) ×4 IMPLANT
HANDPIECE INTERPULSE COAX TIP (DISPOSABLE) ×1
HOOD PEEL AWAY FACE SHEILD DIS (HOOD) ×4 IMPLANT
IMMOBILIZER KNEE 22 UNIV (SOFTGOODS) ×2 IMPLANT
INSERT CUSHION PRONEVIEW LG (MISCELLANEOUS) IMPLANT
KIT BASIN OR (CUSTOM PROCEDURE TRAY) ×2 IMPLANT
KIT ROOM TURNOVER OR (KITS) ×2 IMPLANT
MANIFOLD NEPTUNE II (INSTRUMENTS) ×2 IMPLANT
NS IRRIG 1000ML POUR BTL (IV SOLUTION) ×2 IMPLANT
PACK TOTAL JOINT (CUSTOM PROCEDURE TRAY) ×2 IMPLANT
PAD ARMBOARD 7.5X6 YLW CONV (MISCELLANEOUS) ×2 IMPLANT
PAD CAST 4YDX4 CTTN HI CHSV (CAST SUPPLIES) ×1 IMPLANT
PADDING CAST COTTON 4X4 STRL (CAST SUPPLIES) ×1
PADDING CAST COTTON 6X4 STRL (CAST SUPPLIES) ×2 IMPLANT
POSITIONER HEAD PRONE TRACH (MISCELLANEOUS) ×2 IMPLANT
RUBBERBAND STERILE (MISCELLANEOUS) ×2 IMPLANT
SET HNDPC FAN SPRY TIP SCT (DISPOSABLE) ×1 IMPLANT
SPONGE GAUZE 4X4 12PLY (GAUZE/BANDAGES/DRESSINGS) ×2 IMPLANT
STRIP CLOSURE SKIN 1/2X4 (GAUZE/BANDAGES/DRESSINGS) ×2 IMPLANT
SUCTION FRAZIER TIP 10 FR DISP (SUCTIONS) ×2 IMPLANT
SUT ETHIBOND NAB CT1 #1 30IN (SUTURE) ×4 IMPLANT
SUT MNCRL AB 3-0 PS2 18 (SUTURE) ×2 IMPLANT
SUT VIC AB 0 CT1 27 (SUTURE) ×2
SUT VIC AB 0 CT1 27XBRD ANBCTR (SUTURE) ×2 IMPLANT
SUT VIC AB 2-0 CT1 27 (SUTURE) ×2
SUT VIC AB 2-0 CT1 TAPERPNT 27 (SUTURE) ×2 IMPLANT
SYR 30ML SLIP (SYRINGE) ×2 IMPLANT
TOWEL OR 17X24 6PK STRL BLUE (TOWEL DISPOSABLE) ×2 IMPLANT
TOWEL OR 17X26 10 PK STRL BLUE (TOWEL DISPOSABLE) ×2 IMPLANT
TRAY FOLEY CATH 14FR (SET/KITS/TRAYS/PACK) ×2 IMPLANT
WATER STERILE IRR 1000ML POUR (IV SOLUTION) ×4 IMPLANT

## 2013-01-31 NOTE — Op Note (Signed)
MRN:     [id number] DOB/AGE:    [birth year] / 64 y.o.       OPERATIVE REPORT    DATE OF PROCEDURE:  01/31/2013       PREOPERATIVE DIAGNOSIS:   DJD LEFT KNEE      Estimated body mass index is 29.03 kg/(m^2) as calculated from the following:   Height as of 01/25/13: 5\' 7"  (1.702 m).   Weight as of 12/20/12: 84.1 kg (185 lb 6.5 oz).                                                        POSTOPERATIVE DIAGNOSIS:   DJD LEFT KNEE                                                                      PROCEDURE:  Procedure(s): TOTAL KNEE ARTHROPLASTY Using Depuy Sigma RP implants #3 Femur, #4Tibia, 10mm sigma RP bearing, 32 Patella     SURGEON: WAINER,ROBERT A    ASSISTANT:  Kirstin Shepperson PA-C   (Present and scrubbed throughout the case, critical for assistance with exposure, retraction, instrumentation, and closure.)         ANESTHESIA: GET with Femoral Nerve Block  DRAINS: foley, 2 medium hemovac in knee   TOURNIQUET TIME:   COMPLICATIONS:  None     SPECIMENS: None   INDICATIONS FOR PROCEDURE: The patient has  DJD LEFT KNEE, varus deformities, XR shows bone on bone arthritis. Patient has failed all conservative measures including anti-inflammatory medicines, narcotics, attempts at  exercise and weight loss, cortisone injections and viscosupplementation.  Risks and benefits of surgery have been discussed, questions answered.   DESCRIPTION OF PROCEDURE: The patient identified by armband, received  right femoral nerve block and IV antibiotics, in the holding area at [facility]. Patient taken to the operating room, appropriate anesthetic  monitors were attached General endotracheal anesthesia induced with  the patient in supine position, Foley catheter was inserted. Tourniquet  applied high to the operative thigh. Lateral post and foot positioner  applied to the table, the lower extremity was then prepped and draped  in usual sterile fashion from the ankle to the tourniquet.  Time-out procedure was performed. The limb was wrapped with an Esmarch bandage and the tourniquet inflated to 365 mmHg. We began the operation by making the anterior midline incision starting at handbreadth above the patella going over the patella 1 cm medial to and  4 cm distal to the tibial tubercle. Small bleeders in the skin and the  subcutaneous tissue identified and cauterized. Transverse retinaculum was incised and reflected medially and a medial parapatellar arthrotomy was accomplished. the patella was everted and theprepatellar fat pad resected. The superficial medial collateral  ligament was then elevated from anterior to posterior along the proximal  flare of the tibia and anterior half of the menisci resected. The knee was hyperflexed exposing bone on bone arthritis. Peripheral and notch osteophytes as well as the cruciate ligaments were then resected. We continued to  work our way around posteriorly along the proximal tibia, and externally  rotated the tibia subluxing it out from underneath the femur. A McHale  retractor was placed through the notch and a lateral Hohmann retractor  placed, and we then drilled through the proximal tibia in line with the  axis of the tibia followed by an intramedullary guide rod and 2-degree  posterior slope cutting guide. The tibial cutting guide was pinned into place  allowing resection of 4 mm of bone medially and about 6 mm of bone  laterally because of her varus deformity. Satisfied with the tibial resection, we then  entered the distal femur 2 mm anterior to the PCL origin with the  intramedullary guide rod and applied the distal femoral cutting guide  set at 11mm, with 5 degrees of valgus. This was pinned along the  epicondylar axis. At this point, the distal femoral cut was accomplished without difficulty. We then sized for a #3 femoral component and pinned the guide in 3 degrees of external rotation.The chamfer cutting guide was pinned into place.  The anterior, posterior, and chamfer cuts were accomplished without difficulty followed by  the Sigma RP box cutting guide and the box cut. We also removed posterior osteophytes from the posterior femoral condyles. At this  time, the knee was brought into full extension. We checked our  extension and flexion gaps and found them symmetric at 10mm.  The patella thickness measured at 25 mm. We set the cutting guide at 15 and removed the posterior 9.5-10 mm  of the patella sized for 32 button and drilled the lollipop. The knee  was then once again hyperflexed exposing the proximal tibia. We sized for a #4 tibial base plate, applied the smokestack and the conical reamer followed by the the Delta fin keel punch. We then hammered into place the Sigma RP trial femoral component, inserted a 10-mm trial bearing, trial patellar button, and took the knee through range of motion from 0-130 degrees. No thumb pressure was required for patellar  tracking. At this point, all trial components were removed, a double batch of DePuy HV cement with 1500 mg of Zinacef was mixed and applied to all bony metallic mating surfaces except for the posterior condyles of the femur itself. In order, we  hammered into place the tibial tray and removed excess cement, the femoral component and removed excess cement, a 10-mm Sigma RP bearing  was inserted, and the knee brought to full extension with compression.  The patellar button was clamped into place, and excess cement  removed. While the cement cured the wound was irrigated out with normal saline solution pulse lavage, and medium Hemovac drains were placed.. Ligament stability and patellar tracking were checked and found to be excellent. The tourniquet was then released and hemostasis was obtained with cautery. The parapatellar arthrotomy was closed with  #1 ethibond suture. The subcutaneous tissue with 0 and 2-0 undyed  Vicryl suture, and 4-0 Monocryl.. A dressing of Xeroform,  4 x  4, dressing sponges, Webril, and Ace wrap applied. Needle and sponge count were correct times 2.The patient awakened, extubated, and taken to recovery room without difficulty. Vascular status was normal, pulses 2+ and symmetric.   WAINER,ROBERT A 01/31/2013, 11:38 AM

## 2013-01-31 NOTE — Preoperative (Signed)
Beta Blockers   Reason not to administer Beta Blockers:Not Applicable 

## 2013-01-31 NOTE — Progress Notes (Signed)
Orthopedic Tech Progress Note Patient Details:  David Weaver [birth year] [id number]  CPM Left Knee CPM Left Knee: On Left Knee Flexion (Degrees): 60 Left Knee Extension (Degrees): 0   Shawnie Pons 01/31/2013, 12:48 PM

## 2013-01-31 NOTE — Evaluation (Signed)
Physical Therapy Evaluation Patient Details Name: David Weaver MRN: [id number] DOB: 1951/11/07 Today's Date: 01/31/2013 Time: 4098-1191 PT Time Calculation (min): 26 min  PT Assessment / Plan / Recommendation Clinical Impression  Patient is a 62 yo male s/p Lt. TKA.  Patient moved well today to chair.  Will benefit from acute PT to maximize independence prior to return home with family.  Recommend HHPT at discharge for continued therapy.    PT Assessment  Patient needs continued PT services    Follow Up Recommendations  Home health PT;Supervision/Assistance - 24 hour    Does the patient have the potential to tolerate intense rehabilitation      Barriers to Discharge None      Equipment Recommendations  None recommended by PT    Recommendations for Other Services     Frequency 7X/week    Precautions / Restrictions Precautions Precautions: Knee Precaution Booklet Issued: Yes (comment) Precaution Comments: Reviewed precautions with patient. Required Braces or Orthoses: Knee Immobilizer - Left Knee Immobilizer - Left: On except when in CPM;Discontinue once straight leg raise with < 10 degree lag Restrictions Weight Bearing Restrictions: Yes LLE Weight Bearing: Weight bearing as tolerated   Pertinent Vitals/Pain       Mobility  Bed Mobility Bed Mobility: Supine to Sit;Sitting - Scoot to Edge of Bed Supine to Sit: 4: Min guard;With rails;HOB flat Sitting - Scoot to Edge of Bed: 4: Min guard Details for Bed Mobility Assistance: Verbal cues for technique.  Assist for safety only. Transfers Transfers: Sit to Stand;Stand to [employer] Transfers Sit to Stand: 4: Min assist;With upper extremity assist;From bed Stand to Sit: 4: Min assist;With upper extremity assist;With armrests;To chair/3-in-1 Stand Pivot Transfers: 4: Min assist Details for Transfer Assistance: Verbal cues for hand placement, placement of LLE and safety during transfers.  Reviewed safe use of RW.   Patient able to take several steps to pivot to chair. Ambulation/Gait Ambulation/Gait Assistance: Not tested (comment)    Exercises Total Joint Exercises Ankle Circles/Pumps: AROM;Both;15 reps;Seated   PT Diagnosis: Difficulty walking;Acute pain  PT Problem List: Decreased strength;Decreased range of motion;Decreased activity tolerance;Decreased balance;Decreased mobility;Decreased knowledge of use of DME;Decreased knowledge of precautions;Pain PT Treatment Interventions: DME instruction;Gait training;Functional mobility training;Therapeutic exercise;Patient/family education   PT Goals Acute Rehab PT Goals PT Goal Formulation: With patient Time For Goal Achievement: 02/07/13 Potential to Achieve Goals: Good Pt will go Sit to Stand: with supervision;with upper extremity assist PT Goal: Sit to Stand - Progress: Goal set today Pt will go Stand to Sit: with supervision;with upper extremity assist PT Goal: Stand to Sit - Progress: Goal set today Pt will Ambulate: >150 feet;with supervision;with rolling walker PT Goal: Ambulate - Progress: Goal set today Pt will Perform Home Exercise Program: Independently PT Goal: Perform Home Exercise Program - Progress: Goal set today  Visit Information  Last PT Received On: 01/31/13 Assistance Needed: +1    Subjective Data  Subjective: "I'm not hurting bad at all"  "I just did this with my right leg 5 weeks ago." Patient Stated Goal: To go home soon   Prior Functioning  Home Living Lives With: Spouse;Son Available Help at Discharge: Family;Available 24 hours/day Type of Home: House Home Access: Stairs to enter [employer] of Steps: 1 Entrance Stairs-Rails: None Home Layout: One level Bathroom Shower/Tub: Health visitor: Standard Bathroom Accessibility: Yes How Accessible: Accessible via walker Home Adaptive Equipment: Bedside commode/3-in-1;Straight cane;Walker - rolling Prior Function Level of Independence:  Independent Able to Take Stairs?: Yes  Driving: Yes Vocation: Full time employment Communication Communication: Surveyor, mining Overall Cognitive Status: Appears within functional limits for tasks assessed/performed Arousal/Alertness: Awake/alert Orientation Level: Appears intact for tasks assessed Behavior During Session: [facility] for tasks performed    Extremity/Trunk Assessment Right Upper Extremity Assessment RUE ROM/Strength/Tone: WFL for tasks assessed RUE Sensation: WFL - Light Touch Left Upper Extremity Assessment LUE ROM/Strength/Tone: WFL for tasks assessed LUE Sensation: WFL - Light Touch Right Lower Extremity Assessment RLE ROM/Strength/Tone: WFL for tasks assessed RLE Sensation: WFL - Light Touch Left Lower Extremity Assessment LLE ROM/Strength/Tone: Deficits;Unable to fully assess;Due to pain LLE ROM/Strength/Tone Deficits: Able to lift LLE off of bed - at least 3/5 strength Trunk Assessment Trunk Assessment: Normal   Balance Balance Balance Assessed: Yes Static Sitting Balance Static Sitting - Balance Support: No upper extremity supported;Feet supported Static Sitting - Level of Assistance: 5: Stand by assistance Static Sitting - Comment/# of Minutes: 7 Static Standing Balance Static Standing - Balance Support: Bilateral upper extremity supported Static Standing - Level of Assistance: 5: Stand by assistance Static Standing - Comment/# of Minutes: 3  End of Session PT - End of Session Equipment Utilized During Treatment: Gait belt;Left knee immobilizer;Oxygen Activity Tolerance: Patient tolerated treatment well Patient left: in chair;with call bell/phone within reach;with family/visitor present Nurse Communication: Mobility status CPM Left Knee CPM Left Knee: Off (off at 16:10)  GP     Vena Austria 01/31/2013, 4:45 PM Durenda Hurt. Renaldo Fiddler, [facility] Acute Rehab Services Pager [phone]

## 2013-01-31 NOTE — Interval H&P Note (Signed)
History and Physical Interval Note:  01/31/2013 9:15 AM  David Weaver  has presented today for surgery, with the diagnosis of DJD LEFT KNEE  The various methods of treatment have been discussed with the patient and family. After consideration of risks, benefits and other options for treatment, the patient has consented to  Procedure(s): TOTAL KNEE ARTHROPLASTY (Left) as a surgical intervention .  The patient's history has been reviewed, patient examined, no change in status, stable for surgery.  I have reviewed the patient's chart and labs.  Questions were answered to the patient's satisfaction.     [name]

## 2013-01-31 NOTE — Plan of Care (Signed)
Problem: Consults Goal: Diagnosis- Total Joint Replacement Primary Total Knee     

## 2013-01-31 NOTE — Anesthesia Procedure Notes (Addendum)
Anesthesia Regional Block:  Femoral nerve block  Pre-Anesthetic Checklist: ,, timeout performed, Correct Patient, Correct Site, Correct Laterality, Correct Procedure, Correct Position, site marked, Risks and benefits discussed,  Surgical consent,  Pre-op evaluation,  At surgeon's request and post-op pain management  Laterality: Left  Prep: chloraprep       Needles:  Injection technique: Single-shot  Needle Type: Stimulator Needle - 40     Needle Length: 4cm  Needle Gauge: 22 and 22 G    Additional Needles:  Procedures: nerve stimulator Femoral nerve block  Nerve Stimulator or Paresthesia:  Response: patella twitch, 0.45 mA, 0.1 ms,   Additional Responses:   Narrative:  Start time: 01/31/2013 9:34 AM End time: 01/31/2013 9:40 AM Injection made incrementally with aspirations every 5 mL.  Performed by: Personally  Anesthesiologist: Sandford Craze, MD  Additional Notes: Pt identified in Holding room.  Monitors applied. Working IV access confirmed. Sterile prep L groin.  #22ga PNS to patellar twitch at 0.37mA threshold.  30cc 0.5% Bupivacaine with 1:200k epi injected incrementally after negative test dose.  Patient asymptomatic, VSS, no heme aspirated, tolerated well.  Sandford Craze, MD   Procedure Name: Intubation Date/Time: 01/31/2013 10:17 AM Performed by: Carmela Rima Pre-anesthesia Checklist: Patient identified, Timeout performed, Emergency Drugs available, Patient being monitored and Suction available Patient Re-evaluated:Patient Re-evaluated prior to inductionOxygen Delivery Method: Circle system utilized Preoxygenation: Pre-oxygenation with 100% oxygen Intubation Type: IV induction Ventilation: Mask ventilation without difficulty Laryngoscope Size: Mac and 3 Grade View: Grade II Tube type: Oral Tube size: 7.5 mm Number of attempts: 1 Placement Confirmation: ETT inserted through vocal cords under direct vision,  breath sounds checked- equal and bilateral and positive  ETCO2 Secured at: 22 cm Tube secured with: Tape Dental Injury: Teeth and Oropharynx as per pre-operative assessment

## 2013-01-31 NOTE — H&P (View-Only) (Signed)
TOTAL KNEE ADMISSION H&P  Patient is being admitted for left total knee arthroplasty.  Subjective:  Chief Complaint:left knee pain.  HPI: David Weaver, 62 y.o. male, has a history of pain and functional disability in the left knee due to arthritis and has failed non-surgical conservative treatments for greater than 12 weeks to includeNSAID's and/or analgesics, corticosteriod injections, flexibility and strengthening excercises, supervised PT with diminished ADL's post treatment, use of assistive devices, weight reduction as appropriate and activity modification.  Onset of symptoms was gradual, starting 7 years ago with gradually worsening course since that time. The patient noted no past surgery on the left knee(s).  Patient currently rates pain in the left knee(s) at 8 out of 10 with activity. Patient has night pain, worsening of pain with activity and weight bearing, pain that interferes with activities of daily living, crepitus and joint swelling.  Patient has evidence of subchondral sclerosis, periarticular osteophytes and joint space narrowing by imaging studies. . There is no active infection.  Patient Active Problem List   Diagnosis Date Noted  . Left knee DJD   . Allergic rhinitis, cause unspecified   . Osteoarthrosis, unspecified whether generalized or localized, lower leg   . Tobacco use disorder   . Alcohol abuse, daily use   . Esophageal reflux   . Lupus erythematosus   . Unspecified hearing loss   . Insomnia, unspecified   . Right knee DJD   . Unspecified vitamin D deficiency   . Essential hypertension, benign 11/01/2012  . Other abnormal glucose 11/01/2012  . Pre-operative clearance 11/01/2012  . Depression 11/01/2012  . Need for prophylactic vaccination and inoculation against influenza 11/01/2012  . Vitamin D deficiency 11/01/2012  . Insomnia 11/01/2012   Past Medical History  Diagnosis Date  . Other abnormal glucose   . Allergic rhinitis, cause unspecified   .  Osteoarthrosis, unspecified whether generalized or localized, lower leg   . Other specified disorder of male genital organs   . Other malaise and fatigue   . Tobacco use disorder     quit smokeing 2012  . Alcohol abuse, daily use   . Essential hypertension, benign   . Dysthymic disorder   . Esophageal reflux   . Impotence of organic origin   . Lupus erythematosus   . Unspecified hearing loss   . Insomnia, unspecified   . Unspecified vitamin D deficiency   . Left knee DJD   . Shortness of breath   . Fibromyalgia     Past Surgical History  Procedure Laterality Date  . Cholecystectomy    . Carpal tunnel release      bilateral  . Ganglion cyst excision  1980    Left wrist  . Knee arthroscopy  2008    Right  . Lumbar spine epidural  2009    [facility]  . Rotator cuff repair  10/2008    right  . Total knee arthroplasty  12/27/2012    Procedure: TOTAL KNEE ARTHROPLASTY;  Surgeon: Robert A Wainer, MD;  Location: [facility] OR;  Service: Orthopedics;  Laterality: Right;  RIGHT ARTHROPLASTY KNEE MEDIAL AND LATERAL COMPARTMENTS WITH PATELLA RESURFACING, RIGHT TOTAL KNEE REPLACEMENT     (Not in a hospital admission) No Known Allergies   Current Outpatient Prescriptions on File Prior to Visit  Medication Sig Dispense Refill  . acetaminophen (TYLENOL) 325 MG tablet Take 2 tablets (650 mg total) by mouth every 6 (six) hours as needed for pain (or Fever >/= 101).      .   amLODipine (NORVASC) 10 MG tablet Take 10 mg by mouth daily.      . celecoxib (CELEBREX) 200 MG capsule Take 1 capsule (200 mg total) by mouth daily.  30 capsule  3  . docusate sodium 100 MG CAPS Take 100 mg by mouth 2 (two) times daily.  60 capsule  0  . DULoxetine (CYMBALTA) 60 MG capsule Take 60 mg by mouth 2 (two) times daily.      . enoxaparin (LOVENOX) 30 MG/0.3ML injection Inject 0.3 mLs (30 mg total) into the skin every 12 (twelve) hours.  30 Syringe  0  . hydroxychloroquine (PLAQUENIL) 200 MG tablet Take 200 mg  by mouth 2 (two) times daily.      . omeprazole (PRILOSEC) 40 MG capsule Take 40 mg by mouth daily.      . oxyCODONE (ROXICODONE) 15 MG immediate release tablet 1 tablets every 3-4 hrs as needed for pain  100 tablet  0  . tadalafil (CIALIS) 20 MG tablet Take 20 mg by mouth daily as needed.      . Vitamin D, Ergocalciferol, (DRISDOL) 50000 UNITS CAPS Take 50,000 Units by mouth every 7 (seven) days.       No current facility-administered medications on file prior to visit.    History  Substance Use Topics  . Smoking status: Former Smoker -- 2.00 packs/day for 30 years    Types: Cigarettes    Quit date: 12/28/2008  . Smokeless tobacco: Never Used     Comment: quit 12 /2011  . Alcohol Use: 5.6 oz/week    6 Cans of beer, 4 Drinks containing 0.5 oz of alcohol per week     Comment: 2 drinks of liquor, every afternoon    12/28/2012  wekends 4 5 beers    Family History  Problem Relation Age of Onset  . Alzheimer's disease Mother   . Osteoporosis Mother   . Heart disease Father   . Alcohol abuse Father   . Cancer Brother     colon  . Diabetes Brother      Review of Systems  Constitutional: Negative.   HENT: Negative.   Eyes: Negative.   Respiratory: Negative.   Cardiovascular: Negative.   Gastrointestinal: Negative.   Genitourinary: Negative.   Musculoskeletal:       Left knee pain   6 weeks s/p right total knee  Skin: Negative.   Neurological: Negative.   Endo/Heme/Allergies: Negative.     Objective:  Physical Exam  Constitutional: He is oriented to person, place, and time. He appears well-developed and well-nourished.  HENT:  Head: Normocephalic and atraumatic.  Eyes: Conjunctivae and EOM are normal. Pupils are equal, round, and reactive to light.  Neck: Neck supple.  Cardiovascular: Normal rate, regular rhythm and normal heart sounds.  Exam reveals no gallop and no friction rub.   No murmur heard. Respiratory: Effort normal.  GI: Soft.  Genitourinary:  Not  pertinent to current symptomatology therefore not examined.  Musculoskeletal:  Examination of left knee reveals moderate varus deformity, diffuse pain, 1+ crepitation 1+ synovitis range of motion -5 to 125 degrees both knees are stable with normal patella tracking.  Right knee has a well healed total knee incision for total knee replacement 6 weeks ago Gait exam reveals significant antalgic gait with pain in both knees. Vascular exam: pulses 2+ and symmetric.  Neurological: He is alert and oriented to person, place, and time.  Skin: Skin is warm and dry.  Psychiatric: He has a normal mood and affect.   His behavior is normal.    Vital signs in last 24 hours: Last recorded: 02/19 1500   BP: 128/82 Pulse: 95  Temp: 97.8 F (36.6 C)    Height: 5' 6" (1.676 m) SpO2: 96  Weight: 81.647 kg (180 lb)     Labs:   Estimated body mass index is 29.07 kg/(m^2) as calculated from the following:   Height as of this encounter: 5' 6" (1.676 m).   Weight as of this encounter: 81.647 kg (180 lb).   Imaging Review Plain radiographs demonstrate severe degenerative joint disease of the left knee(s). The overall alignment issignificant varus. The bone quality appears to be good for age and reported activity level.  Assessment/Plan:  End stage arthritis, left knee  Patient Active Problem List  Diagnosis  . Essential hypertension, benign  . Other abnormal glucose  . Pre-operative clearance  . Depression  . Need for prophylactic vaccination and inoculation against influenza  . Vitamin D deficiency  . Insomnia  . Osteoarthrosis, unspecified whether generalized or localized, lower leg  . Tobacco use disorder  . Alcohol abuse, daily use  . Esophageal reflux  . Lupus erythematosus  . Unspecified hearing loss  . Insomnia, unspecified  . Right knee DJD  . Unspecified vitamin D deficiency  . Left knee DJD  . Allergic rhinitis, cause unspecified   The patient history, physical examination, clinical  judgment of the provider and imaging studies are consistent with end stage degenerative joint disease of the left knee(s) and total knee arthroplasty is deemed medically necessary. The treatment options including medical management, injection therapy arthroscopy and arthroplasty were discussed at length. The risks and benefits of total knee arthroplasty were presented and reviewed. The risks due to aseptic loosening, infection, stiffness, patella tracking problems, thromboembolic complications and other imponderables were discussed. The patient acknowledged the explanation, agreed to proceed with the plan and consent was signed. Patient is being admitted for inpatient treatment for surgery, pain control, PT, OT, prophylactic antibiotics, VTE prophylaxis, progressive ambulation and ADL's and discharge planning. The patient is planning to be discharged home with home health services  Reis Pienta A. Lylie Blacklock, PA-C Physician Assistant [facility] [phone]  01/19/2013, 3:26 PM   

## 2013-01-31 NOTE — Anesthesia Preprocedure Evaluation (Addendum)
Anesthesia Evaluation  Patient identified by MRN, date of birth, ID band Patient awake    Reviewed: Allergy & Precautions, H&P , NPO status , Patient's Chart, lab work & pertinent test results, reviewed documented beta blocker date and time   Airway Mallampati: II TM Distance: >3 FB Neck ROM: Full    Dental  (+) Partial Lower, Partial Upper, Dental Advisory Given and Poor Dentition   Pulmonary shortness of breath and with exertion, former smoker (quit 3 years),  breath sounds clear to auscultation  Pulmonary exam normal       Cardiovascular hypertension, Pt. on medications Rhythm:Regular Rate:Normal     Neuro/Psych PSYCHIATRIC DISORDERS Depression negative neurological ROS     GI/Hepatic Neg liver ROS, GERD-  Medicated and Controlled,  Endo/Other  negative endocrine ROS  Renal/GU negative Renal ROS     Musculoskeletal  (+) Arthritis -, Osteoarthritis,  Fibromyalgia -  Abdominal   Peds  Hematology   Anesthesia Other Findings   Reproductive/Obstetrics                          Anesthesia Physical Anesthesia Plan  ASA: II  Anesthesia Plan: General   Post-op Pain Management:    Induction: Intravenous  Airway Management Planned: Oral ETT  Additional Equipment:   Intra-op Plan:   Post-operative Plan:   Informed Consent: I have reviewed the patients History and Physical, chart, labs and discussed the procedure including the risks, benefits and alternatives for the proposed anesthesia with the patient or authorized representative who has indicated his/her understanding and acceptance.   Dental advisory given and Dental Advisory Given  Plan Discussed with: CRNA, Surgeon and Anesthesiologist  Anesthesia Plan Comments: (Plan routine monitors, GETA with femoral nerve block for post-op analgesia)       Anesthesia Quick Evaluation

## 2013-01-31 NOTE — Transfer of Care (Signed)
Immediate Anesthesia Transfer of Care Note  Patient: David Weaver  Procedure(s) Performed: Procedure(s): TOTAL KNEE ARTHROPLASTY (Left)  Patient Location: PACU  Anesthesia Type:General  Level of Consciousness: awake, alert  and oriented  Airway & Oxygen Therapy: Patient Spontanous Breathing and Patient connected to nasal cannula oxygen  Post-op Assessment: Report given to PACU RN, Post -op Vital signs reviewed and stable and Patient moving all extremities X 4  Post vital signs: Reviewed and stable  Complications: No apparent anesthesia complications

## 2013-01-31 NOTE — Anesthesia Postprocedure Evaluation (Signed)
  Anesthesia Post-op Note  Patient: David Weaver  Procedure(s) Performed: Procedure(s): TOTAL KNEE ARTHROPLASTY (Left)  Patient Location: PACU  Anesthesia Type:GA combined with regional for post-op pain  Level of Consciousness: awake and alert   Airway and Oxygen Therapy: Patient Spontanous Breathing  Post-op Pain: mild  Post-op Assessment: Post-op Vital signs reviewed, Patient's Cardiovascular Status Stable, Respiratory Function Stable, Patent Airway, No signs of Nausea or vomiting and Pain level controlled  Post-op Vital Signs: stable  Complications: No apparent anesthesia complications

## 2013-01-31 NOTE — Progress Notes (Signed)
UR COMPLETED  

## 2013-02-01 LAB — CBC
HCT: 35.6 % — ABNORMAL LOW (ref 39.0–52.0)
Platelets: 258 10*3/uL (ref 150–400)
RDW: 13.6 % (ref 11.5–15.5)
WBC: 11.3 10*3/uL — ABNORMAL HIGH (ref 4.0–10.5)

## 2013-02-01 LAB — BASIC METABOLIC PANEL
Chloride: 105 mEq/L (ref 96–112)
GFR calc Af Amer: >90 mL/min (ref >90)
Potassium: 4.1 mEq/L (ref 3.5–5.1)

## 2013-02-01 MED ORDER — OXYCODONE HCL 15 MG PO TABS: ORAL_TABLET | ORAL | Status: DC

## 2013-02-01 MED ORDER — POLYETHYLENE GLYCOL 3350 17 GM/SCOOP PO POWD: 17.0000 g | Freq: Three times a day (TID) | ORAL | Status: DC | PRN

## 2013-02-01 MED ORDER — ENOXAPARIN SODIUM 30 MG/0.3ML ~~LOC~~ SOLN: 30.0000 mg | Freq: Two times a day (BID) | SUBCUTANEOUS | Status: DC

## 2013-02-01 MED ORDER — DIAZEPAM 2 MG PO TABS: 2.0000 mg | ORAL_TABLET | Freq: Three times a day (TID) | ORAL | Status: DC | PRN

## 2013-02-01 NOTE — Progress Notes (Signed)
Physical Therapy Treatment Patient Details Name: David Weaver MRN: [id number] DOB: 1951-01-02 Today's Date: 02/01/2013 Time: 8295-6213 PT Time Calculation (min): 24 min  PT Assessment / Plan / Recommendation Comments on Treatment Session       Follow Up Recommendations  Home health PT;Supervision/Assistance - 24 hour     Does the patient have the potential to tolerate intense rehabilitation     Barriers to Discharge        Equipment Recommendations  None recommended by PT    Recommendations for Other Services    Frequency 7X/week   Plan Discharge plan remains appropriate    Precautions / Restrictions Precautions Precautions: Knee Precaution Booklet Issued: Yes (comment) Precaution Comments: Reviewed precautions with patient. Required Braces or Orthoses: Knee Immobilizer - Left Knee Immobilizer - Left: On except when in CPM;Discontinue once straight leg raise with < 10 degree lag (pt performed IND SLR this am) Restrictions Weight Bearing Restrictions: No LLE Weight Bearing: Weight bearing as tolerated   Pertinent Vitals/Pain 3/10; premed    Mobility  Bed Mobility Bed Mobility: Supine to Sit;Sit to Supine Supine to Sit: 5: Supervision Sitting - Scoot to Edge of Bed: 5: Supervision Sit to Supine: 5: Supervision Details for Bed Mobility Assistance: Verbal cues for technique.  Assist for safety only. Transfers Transfers: Sit to Stand;Stand to [employer] Transfers Sit to Stand: 5: Supervision Stand to Sit: 5: Supervision Details for Transfer Assistance: Verbal cues for hand placement, placement of LLE and safety during transfers.  Reviewed safe use of RW.  Patient able to take several steps to pivot to chair. Ambulation/Gait Ambulation/Gait Assistance: 4: Min guard;5: Supervision Ambulation Distance (Feet): 150 Feet Assistive device: Rolling walker Ambulation/Gait Assistance Details: min cues for posture and position from RW Gait Pattern: Step-to pattern Stairs:  Yes Stairs Assistance: 4: Min assist Stairs Assistance Details (indicate cue type and reason): cues for sequence and foot placement Stair Management Technique: No rails;Backwards;With walker;Step to pattern Number of Stairs: 1    Exercises Total Joint Exercises Ankle Circles/Pumps: AROM;Both;15 reps;Seated Quad Sets: AROM;15 reps;Both;Supine Heel Slides: AAROM;Both;15 reps;Supine Straight Leg Raises: AAROM;AROM;15 reps;Supine;Left   PT Diagnosis:    PT Problem List:   PT Treatment Interventions:     PT Goals Acute Rehab PT Goals PT Goal Formulation: With patient Time For Goal Achievement: 02/07/13 Potential to Achieve Goals: Good Pt will go Sit to Stand: with supervision;with upper extremity assist PT Goal: Sit to Stand - Progress: Met Pt will go Stand to Sit: with supervision;with upper extremity assist PT Goal: Stand to Sit - Progress: Met Pt will Ambulate: >150 feet;with supervision;with rolling walker PT Goal: Ambulate - Progress: Met Pt will Perform Home Exercise Program: Independently PT Goal: Perform Home Exercise Program - Progress: Progressing toward goal  Visit Information  Last PT Received On: 02/01/13 Assistance Needed: +1    Subjective Data  Subjective: I'm going home today Patient Stated Goal: To go home soon   Cognition  Cognition Overall Cognitive Status: Appears within functional limits for tasks assessed/performed Arousal/Alertness: Awake/alert Orientation Level: Appears intact for tasks assessed Behavior During Session: [facility] for tasks performed    Balance  Static Sitting Balance Static Sitting - Balance Support: No upper extremity supported;Feet supported Static Sitting - Level of Assistance: 5: Stand by assistance  End of Session PT - End of Session Activity Tolerance: Patient tolerated treatment well Patient left: in bed;with call bell/phone within reach;with family/visitor present Nurse Communication: Mobility status   GP      [facility]  02/01/2013, 9:29 AM

## 2013-02-01 NOTE — Discharge Summary (Signed)
Patient ID: MYREON WIMER MRN: [id number] DOB/AGE: 1951-09-07 62 y.o.  Admit date: 01/31/2013 Discharge date: 02/01/2013  Admission Diagnoses:  Principal Problem:   Left knee DJD Active Problems:   Essential hypertension, benign   Depression   Alcohol abuse, daily use   Esophageal reflux   Lupus erythematosus   Insomnia, unspecified   Discharge Diagnoses:  Same  Past Medical History  Diagnosis Date  . Other abnormal glucose   . Allergic rhinitis, cause unspecified   . Osteoarthrosis, unspecified whether generalized or localized, lower leg   . Other specified disorder of male genital organs   . Other malaise and fatigue   . Tobacco use disorder     quit smokeing 2012  . Alcohol abuse, daily use   . Essential hypertension, benign   . Dysthymic disorder   . Esophageal reflux   . Impotence of organic origin   . Lupus erythematosus   . Unspecified hearing loss   . Insomnia, unspecified   . Unspecified vitamin D deficiency   . Left knee DJD   . Shortness of breath   . Fibromyalgia     Surgeries: Procedure(s): TOTAL KNEE ARTHROPLASTY on 01/31/2013   Consultants:    Discharged Condition: Improved  Hospital Course: TAVITA EASTHAM is an 62 y.o. male who was admitted 01/31/2013 for operative treatment ofLeft knee DJD. Patient has severe unremitting pain that affects sleep, daily activities, and work/hobbies. After pre-op clearance the patient was taken to the operating room on 01/31/2013 and underwent  Procedure(s): TOTAL KNEE ARTHROPLASTY.    Patient was given perioperative antibiotics: Anti-infectives   Start     Dose/Rate Route Frequency Ordered Stop   01/31/13 1600  ceFAZolin (ANCEF) IVPB 1 g/50 mL premix     1 g 100 mL/hr over 30 Minutes Intravenous Every 6 hours 01/31/13 1323 01/31/13 2306   01/31/13 1415  hydroxychloroquine (PLAQUENIL) tablet 200 mg     200 mg Oral 2 times daily 01/31/13 1323     01/31/13 1045  cefUROXime (ZINACEF) injection  Status:  Discontinued        As needed 01/31/13 1046 01/31/13 1206   01/31/13 0600  ceFAZolin (ANCEF) IVPB 2 g/50 mL premix     2 g 100 mL/hr over 30 Minutes Intravenous On call to O.R. 01/30/13 1329 01/31/13 1019       Patient was given sequential compression devices, early ambulation, and chemoprophylaxis to prevent DVT.  Patient benefited maximally from hospital stay and there were no complications.    Recent vital signs: Patient Vitals for the past 24 hrs:  BP Temp Temp src Pulse Resp SpO2  02/01/13 0528 127/71 mmHg 97.6 F (36.4 C) Oral 86 18 95 %  01/31/13 2202 122/70 mmHg 97.7 F (36.5 C) Oral 81 16 93 %  01/31/13 1325 129/73 mmHg 98 F (36.7 C) - 108 16 97 %  01/31/13 1300 128/60 mmHg 98.3 F (36.8 C) - 107 14 95 %  01/31/13 1245 137/75 mmHg - - 103 14 96 %  01/31/13 1230 148/78 mmHg - - 101 20 96 %  01/31/13 1215 142/82 mmHg - - 106 17 96 %  01/31/13 1210 144/80 mmHg 98.7 F (37.1 C) - 113 10 94 %  01/31/13 0945 - - - 87 16 94 %  01/31/13 0900 - - - 86 14 94 %     Recent laboratory studies:  Recent Labs  01/31/13 1318 02/01/13 0545  WBC 10.8* 11.3*  HGB 14.2 12.0*  HCT 40.9 35.6*  PLT 260 258  NA  --  139  K  --  4.1  CL  --  105  CO2  --  26  BUN  --  10  CREATININE 0.57 0.64  GLUCOSE  --  167*  CALCIUM  --  8.6     Discharge Medications:     Medication List    STOP taking these medications       acetaminophen 325 MG tablet  Commonly known as:  TYLENOL     oxyCODONE-acetaminophen 10-325 MG per tablet  Commonly known as:  PERCOCET      TAKE these medications       amLODipine 10 MG tablet  Commonly known as:  NORVASC  Take 10 mg by mouth daily.     celecoxib 200 MG capsule  Commonly known as:  CELEBREX  Take 1 capsule (200 mg total) by mouth daily.     diazepam 2 MG tablet  Commonly known as:  VALIUM  Take 1 tablet (2 mg total) by mouth every 8 (eight) hours as needed (for muscle spasm).     DSS 100 MG Caps  Take 100 mg by mouth 2 (two) times daily.      DULoxetine 60 MG capsule  Commonly known as:  CYMBALTA  Take 60 mg by mouth 2 (two) times daily.     enoxaparin 30 MG/0.3ML injection  Commonly known as:  LOVENOX  Inject 0.3 mLs (30 mg total) into the skin every 12 (twelve) hours.     omeprazole 40 MG capsule  Commonly known as:  PRILOSEC  Take 40 mg by mouth daily.     oxyCODONE 15 MG immediate release tablet  Commonly known as:  ROXICODONE  1-2 tablets every 4-6 hrs as needed for pain     PLAQUENIL 200 MG tablet  Generic drug:  hydroxychloroquine  Take 200 mg by mouth 2 (two) times daily.     polyethylene glycol powder powder  Commonly known as:  GLYCOLAX/MIRALAX  Take 17 g by mouth 3 (three) times daily as needed (for constipation).     tadalafil 20 MG tablet  Commonly known as:  CIALIS  Take 20 mg by mouth daily as needed for erectile dysfunction.     Vitamin D (Ergocalciferol) 50000 UNITS Caps  Commonly known as:  DRISDOL  Take 50,000 Units by mouth every 7 (seven) days.        Diagnostic Studies: No results found.  Disposition: 06-Home-Health Care Svc      Discharge Orders   Future Appointments Provider Department Dept Phone   02/28/2013 9:30 AM Ethelda Chick, MD [facility] [phone]   Future Orders Complete By Expires     CPM  As directed     Comments:      Continuous passive motion machine (CPM):      Use the CPM from 0 to 90 for 6 hours per day.       You may break it up into 2 or 3 sessions per day.      Use CPM for 2 weeks or until you are told to stop.    Call MD / Call 911  As directed     Comments:      If you experience chest pain or shortness of breath, CALL 911 and be transported to the hospital emergency room.  If you develope a fever above 101 F, pus (white drainage) or increased drainage or redness at the wound, or calf pain, call your  surgeon's office.    Change dressing  As directed     Comments:      Change the dressing daily with sterile 4 x 4 inch gauze dressing and  apply TED hose.  You may clean the incision with alcohol prior to redressing.    Constipation Prevention  As directed     Comments:      Drink plenty of fluids.  Prune juice may be helpful.  You may use a stool softener, such as Colace (over the counter) 100 mg twice a day.  Use MiraLax (over the counter) for constipation as needed.    Diet - low sodium heart healthy  As directed     Discharge instructions  As directed     Comments:      Total Knee Replacement Care After Refer to this sheet in the next few weeks. These discharge instructions provide you with general information on caring for yourself after you leave the hospital. Your caregiver may also give you specific instructions. Your treatment has been planned according to the most current medical practices available, but unavoidable complications sometimes occur. If you have any problems or questions after discharge, please call your caregiver. Regaining a near full range of motion of your knee within the first 3 to 6 weeks after surgery is critical. HOME CARE INSTRUCTIONS  You may resume a normal diet and activities as directed.  Perform exercises as directed.  Place yellow foam block, yellow side up under heel at all times except when in CPM or when walking.  DO NOT modify, tear, cut, or change in any way. You will receive physical therapy daily  Take showers instead of baths until informed otherwise.  Change bandages (dressings)daily Do not take over-the-counter or prescription medicines for pain, discomfort, or fever. Eat a well-balanced diet.  Avoid lifting or driving until you are instructed otherwise.  Make an appointment to see your caregiver for stitches (suture) or staple removal as directed.  If you have been sent home with a continuous passive motion machine (CPM machine), 0-90 degrees 6 hrs a day   2 hrs a shift SEEK MEDICAL CARE IF: You have swelling of your calf or leg.  You develop shortness of breath or chest pain.   You have redness, swelling, or increasing pain in the wound.  There is pus or any unusual drainage coming from the surgical site.  You notice a bad smell coming from the surgical site or dressing.  The surgical site breaks open after sutures or staples have been removed.  There is persistent bleeding from the suture or staple line.  You are getting worse or are not improving.  You have any other questions or concerns.  SEEK IMMEDIATE MEDICAL CARE IF:  You have a fever.  You develop a rash.  You have difficulty breathing.  You develop any reaction or side effects to medicines given.  Your knee motion is decreasing rather than improving.  MAKE SURE YOU:  Understand these instructions.  Will watch your condition.  Will get help right away if you are not doing well or get worse.    Do not put a pillow under the knee. Place it under the heel.  As directed     Comments:      Place yellow foam block, yellow side up under heel at all times except when in CPM or when walking.  DO NOT modify, tear, cut, or change in any way the yellow foam block.    Increase  activity slowly as tolerated  As directed     TED hose  As directed     Comments:      Use stockings (TED hose) for 2 weeks on both leg(s).  You may remove them at night for sleeping.       Follow-up Information   Follow up with Nilda Simmer, MD On 02/14/2013. (appt time 2:15pm)    Contact information:   [address] [place] [address] [phone]        Signed: Pascal Lux 02/01/2013, 8:20 AM

## 2013-02-01 NOTE — Progress Notes (Signed)
CARE MANAGEMENT NOTE 02/01/2013  Patient:  VAYDEN, WEINAND   Account Number:  [id number]  Date Initiated:  02/01/2013  Documentation initiated by:  Vance Peper  Subjective/Objective Assessment:   62 yr old male s/p left total knee arthroplasty.     Action/Plan:   CM spoke with patient and wife concerning home health and DME needs at discharge. Patient had right knee done few weeks ago. Has rolling walker, 3in1. CPM has been delivered. Setup with [facility], no changes.   Anticipated DC Date:  02/01/2013   Anticipated DC Plan:  HOME W HOME HEALTH SERVICES      DC Planning Services  CM consult      [facility] Choice  HOME HEALTH   Choice offered to / List presented to:  C-1 Patient        HH arranged  HH-2 PT      [facility] agency  [facility]   Status of service:  Completed, signed off Medicare Important Message given?   (If response is "NO", the following Medicare IM given date fields will be blank) Date Medicare IM given:   Date Additional Medicare IM given:    Discharge Disposition:  HOME W HOME HEALTH SERVICES  Per UR Regulation:    If discussed at Long Length of Stay Meetings, dates discussed:    Comments:

## 2013-02-02 ENCOUNTER — Encounter (HOSPITAL_COMMUNITY): Payer: Self-pay | Admitting: Orthopedic Surgery

## 2013-02-28 ENCOUNTER — Ambulatory Visit (INDEPENDENT_AMBULATORY_CARE_PROVIDER_SITE_OTHER): Payer: BC Managed Care – PPO | Admitting: Family Medicine

## 2013-02-28 ENCOUNTER — Encounter: Payer: Self-pay | Admitting: Family Medicine

## 2013-02-28 VITALS — BP 128/78 | HR 91 | Temp 96.6°F | Resp 16 | Ht 67.0 in | Wt 174.0 lb

## 2013-02-28 DIAGNOSIS — Z0000 Encounter for general adult medical examination without abnormal findings: Secondary | ICD-10-CM

## 2013-02-28 LAB — POCT URINALYSIS DIPSTICK
Bilirubin, UA: NEGATIVE
Ketones, UA: NEGATIVE
Leukocytes, UA: NEGATIVE
Protein, UA: NEGATIVE
Spec Grav, UA: 1.02
pH, UA: 7

## 2013-02-28 LAB — LIPID PANEL
Cholesterol: 159 mg/dL (ref 0–200)
HDL: 38 mg/dL — ABNORMAL LOW (ref >39)
LDL Cholesterol: 88 mg/dL (ref 0–99)
Total CHOL/HDL Ratio: 4.2 Ratio
Triglycerides: 166 mg/dL — ABNORMAL HIGH (ref <150)
VLDL: 33 mg/dL (ref 0–40)

## 2013-02-28 LAB — COMPREHENSIVE METABOLIC PANEL
ALT: 19 U/L (ref 0–53)
AST: 13 U/L (ref 0–37)
Alkaline Phosphatase: 96 U/L (ref 39–117)
BUN: 13 mg/dL (ref 6–23)
Calcium: 9.4 mg/dL (ref 8.4–10.5)
Chloride: 103 mEq/L (ref 96–112)
Creat: 0.63 mg/dL (ref 0.50–1.35)
Total Bilirubin: 0.3 mg/dL (ref 0.3–1.2)

## 2013-02-28 LAB — CBC WITH DIFFERENTIAL/PLATELET
HCT: 39.3 % (ref 39.0–52.0)
Hemoglobin: 13.2 g/dL (ref 13.0–17.0)
Lymphocytes Relative: 21 % (ref 12–46)
Monocytes Absolute: 0.7 10*3/uL (ref 0.1–1.0)
Monocytes Relative: 9 % (ref 3–12)
Neutro Abs: 5.3 10*3/uL (ref 1.7–7.7)
RBC: 4.99 MIL/uL (ref 4.22–5.81)
WBC: 7.8 10*3/uL (ref 4.0–10.5)

## 2013-02-28 LAB — VITAMIN B12: Vitamin B-12: 577 pg/mL (ref 211–911)

## 2013-02-28 MED ORDER — OMEPRAZOLE 40 MG PO CPDR: 40.0000 mg | DELAYED_RELEASE_CAPSULE | Freq: Every day | ORAL | Status: DC

## 2013-02-28 MED ORDER — DULOXETINE HCL 60 MG PO CPEP: 60.0000 mg | ORAL_CAPSULE | Freq: Two times a day (BID) | ORAL | Status: DC

## 2013-02-28 MED ORDER — AMLODIPINE BESYLATE 10 MG PO TABS: 10.0000 mg | ORAL_TABLET | Freq: Every day | ORAL | Status: DC
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
## 2013-02-28 NOTE — Patient Instructions (Addendum)
Annual physical exam - Plan: CBC with Differential, Folate, Vitamin B12, Vitamin D 25 hydroxy, Comprehensive metabolic panel, Hemoglobin A1c, Lipid panel, TSH, PSA, EKG 12-Lead, POCT urinalysis dipstick    START ASPIRIN 81MG  ONE DAILY.

## 2013-02-28 NOTE — Assessment & Plan Note (Signed)
Anticipatory guidance --- exercise, weight loss, ASA 81mg  daily.  Immunizations UTD.  Colonoscopy UTD.  Obtain labs.  Congratulations on smoking and alcohol cessation.

## 2013-02-28 NOTE — Progress Notes (Deleted)
   [address]   [place], [place]  [address]   [phone]  Subjective:    Patient ID: David Weaver, male    DOB: Jul 04, 1951, 62 y.o.   MRN: [id number]  HPI    Review of Systems  Eyes: Positive for itching.       Objective:   Physical Exam        Assessment & Plan:

## 2013-03-01 LAB — VITAMIN D 25 HYDROXY (VIT D DEFICIENCY, FRACTURES): Vit D, 25-Hydroxy: 42 ng/mL (ref 30–89)

## 2013-05-20 ENCOUNTER — Other Ambulatory Visit: Payer: Self-pay | Admitting: Neurosurgery

## 2013-05-20 DIAGNOSIS — M5416 Radiculopathy, lumbar region: Secondary | ICD-10-CM

## 2013-05-30 ENCOUNTER — Ambulatory Visit
Admission: RE | Admit: 2013-05-30 | Discharge: 2013-05-30 | Disposition: A | Discharge status: Home / Self Care | Payer: BC Managed Care – PPO | Source: Ambulatory Visit | Attending: Neurosurgery | Admitting: Neurosurgery

## 2013-05-30 VITALS — BP 129/72 | HR 93

## 2013-05-30 DIAGNOSIS — M5416 Radiculopathy, lumbar region: Secondary | ICD-10-CM

## 2013-05-30 MED ORDER — IOHEXOL 180 MG/ML  SOLN
15.0000 mL | Freq: Once | INTRAMUSCULAR | Status: AC | PRN
  Administered 2013-05-30: 15 mL via INTRATHECAL

## 2013-05-30 MED ORDER — DIAZEPAM 5 MG PO TABS
10.0000 mg | ORAL_TABLET | Freq: Once | ORAL | Status: AC
  Administered 2013-05-30: 10 mg via ORAL

## 2013-05-30 NOTE — Progress Notes (Signed)
Patient states he has been off Cymbalta for the past two days.  Donell Sievert, RN

## 2013-07-04 ENCOUNTER — Ambulatory Visit (INDEPENDENT_AMBULATORY_CARE_PROVIDER_SITE_OTHER): Payer: BC Managed Care – PPO | Admitting: Family Medicine

## 2013-07-04 ENCOUNTER — Encounter: Payer: Self-pay | Admitting: Family Medicine

## 2013-07-04 VITALS — BP 130/80 | HR 109 | Temp 98.2°F | Resp 16 | Ht 66.0 in | Wt 179.8 lb

## 2013-07-04 DIAGNOSIS — G4700 Insomnia, unspecified: Secondary | ICD-10-CM

## 2013-07-04 DIAGNOSIS — F32A Depression, unspecified: Secondary | ICD-10-CM

## 2013-07-04 DIAGNOSIS — I10 Essential (primary) hypertension: Secondary | ICD-10-CM

## 2013-07-04 DIAGNOSIS — R079 Chest pain, unspecified: Secondary | ICD-10-CM

## 2013-07-04 DIAGNOSIS — R5383 Other fatigue: Secondary | ICD-10-CM

## 2013-07-04 DIAGNOSIS — F410 Panic disorder [episodic paroxysmal anxiety] without agoraphobia: Secondary | ICD-10-CM

## 2013-07-04 DIAGNOSIS — F329 Major depressive disorder, single episode, unspecified: Secondary | ICD-10-CM

## 2013-07-04 DIAGNOSIS — R7309 Other abnormal glucose: Secondary | ICD-10-CM

## 2013-07-04 DIAGNOSIS — R5381 Other malaise: Secondary | ICD-10-CM

## 2013-07-04 MED ORDER — AMITRIPTYLINE HCL 10 MG PO TABS: 10.0000 mg | ORAL_TABLET | Freq: Every evening | ORAL | Status: DC | PRN

## 2013-07-04 NOTE — Progress Notes (Signed)
[address]   [place], [place]  [address]   [phone]  Subjective:    Patient ID: David Weaver, male    DOB: 05-Mar-1951, 62 y.o.   MRN: [id number]  HPI This 62 y.o. male presents for four month follow-up on the following:  1.  HTN:  Four month follow-up; no changes to management made at last visit; good tolerance and good compliance to medication; good symptom control.    2.  Glucose Intolerance: present at last visit; due for repeat labs.  3.  Depression:  Four month follow-up; no changes to management made at last visit; reports good compliance to treatment, good tolerance to treatment, good symptom control. Denies major stressors at home or work.  Emotionally stable.  +insomnia; delayed falling asleep for one hour; +fatigue.  +panic attack twice last week at work; very nervous while working.  Almost came for evaluation but decided not to; no recurrent panic attacks.   4.  No energy:  Onset one month ago.  Not sleeping well; hard time falling asleep.  Takes one hour to fall asleep; goes to bed at 10:00 pm; wakes up at 6:00 am.  No stress currently.  Knees are terrible; Thurston Hole says its back; back doctor/Jenkins says its knees.  Knees are still painful but better.  Pain not interfering with sleep.  No new medications in past month.  Still taking vitamin D.  Not taking anything for sleep. Snores sporadically.  No previous sleep study.  Taking 5mg  two daily of oxycodone.  Trazodone makes patient dream and helped a little with insomnia.  Has not tried OTC medications; pharmacist advised against sleep aide due to narcotic use.    5. Alcohol: increased intake since last visit.   Mostly weekend intake and beer.  Rare liquor.  Did overdrink liquor at beach with friends two weeks ago; got really sick after excessive alcohol intake.  6.  Nervous: started with anxiety; painting; pushed for time; +sweaty; +chest pain; no SOB; +nausea. Happened twice that day; each episode lasted 30 minutes. Had a  doctor's appointment that day; had to leave and come back.  No recurrence.  Occurred last week.  No exertional chest pain.  Review of Systems  Constitutional: Positive for fatigue. Negative for fever, chills, diaphoresis, activity change and appetite change.  Eyes: Negative for photophobia and visual disturbance.  Respiratory: Positive for shortness of breath. Negative for cough, choking, wheezing and stridor.   Cardiovascular: Positive for chest pain. Negative for palpitations and leg swelling.  Gastrointestinal: Negative for nausea, vomiting, abdominal pain, diarrhea and constipation.  Endocrine: Negative for cold intolerance, heat intolerance, polydipsia, polyphagia and polyuria.  Musculoskeletal: Positive for myalgias, back pain, joint swelling and arthralgias.  Skin: Negative for rash.  Neurological: Negative for dizziness, tremors, seizures, syncope, facial asymmetry, speech difficulty, weakness, light-headedness, numbness and headaches.  Psychiatric/Behavioral: Positive for sleep disturbance. Negative for suicidal ideas, self-injury and dysphoric mood. The patient is nervous/anxious.    Past Medical History  Diagnosis Date  . Other abnormal glucose   . Allergic rhinitis, cause unspecified   . Osteoarthrosis, unspecified whether generalized or localized, lower leg   . Other specified disorder of male genital organs(608.89)   . Other malaise and fatigue   . Alcohol abuse, daily use   . Essential hypertension, benign   . Esophageal reflux   . Lupus erythematosus   . Unspecified hearing loss   . Insomnia, unspecified   . Unspecified vitamin D deficiency   . Left knee  DJD   . Shortness of breath   . Fibromyalgia     followed by [name] every six months.  . Tobacco use disorder     quit smokeing 2012  . Dysthymic disorder   . Impotence of organic origin    Past Surgical History  Procedure Laterality Date  . Cholecystectomy    . Carpal tunnel release      bilateral  .  Ganglion cyst excision  1980    Left wrist  . Knee arthroscopy  2008    Right  . Lumbar spine epidural  2009    [facility]  . Rotator cuff repair  10/2008    right  . Total knee arthroplasty  12/27/2012    Procedure: TOTAL KNEE ARTHROPLASTY;  Surgeon: Nilda Simmer, MD;  Location: [facility] OR;  Service: Orthopedics;  Laterality: Right;  RIGHT ARTHROPLASTY KNEE MEDIAL AND LATERAL COMPARTMENTS WITH PATELLA RESURFACING, RIGHT TOTAL KNEE REPLACEMENT  . Total knee arthroplasty Left 01/31/2013    Procedure: TOTAL KNEE ARTHROPLASTY;  Surgeon: Nilda Simmer, MD;  Location: [facility] OR;  Service: Orthopedics;  Laterality: Left;  . Joint replacement  01/2013    right knee, left knee. [facility].  . Colonoscopy  08/01/2010   History   Social History  . Marital Status: Married    Spouse Name: N/A    Number of Children: 1  . Years of Education: N/A   Occupational History  . [employer]     Paints cars/trucks   Social History Main Topics  . Smoking status: Former Smoker -- 2.00 packs/day for 30 years    Types: Cigarettes    Quit date: 12/28/2008  . Smokeless tobacco: Never Used     Comment: quit 12 /2011  . Alcohol Use: 5.6 oz/week    6 Cans of beer, 4 Drinks containing 0.5 oz of alcohol per week     Comment: 2 drinks of liquor, every afternoon    12/28/2012  wekends 4 5 beers  . Drug Use: No  . Sexually Active: Yes   Other Topics Concern  . Not on file   Social History Narrative   Guns in the home stored in locked cabinet.       Caffeine use: 1 serving/ day.     Marital status:  Married x 17 years, third marriage, happily married.      Children: one son (67); one step-daughter; two grandsons      Lives: with wife, son.      Employment:  Works at [employer]; Engineer, production x 8 years; happy; 40 hours per week.      Tobacco: none currently; quit 2013.  Smoked x 40 years.      Alcohol:  None since 12/2012; previous heavy drinker; quit drinking when son moved in with him; son is  alcoholic.        Drugs: none      Exercise:  None; walking short distances.   No Known Allergies Current Outpatient Prescriptions on File Prior to Visit  Medication Sig Dispense Refill  . amLODipine (NORVASC) 10 MG tablet Take 1 tablet (10 mg total) by mouth daily.  30 tablet  11  . DULoxetine (CYMBALTA) 60 MG capsule Take 1 capsule (60 mg total) by mouth 2 (two) times daily.  60 capsule  11  . hydroxychloroquine (PLAQUENIL) 200 MG tablet Take 200 mg by mouth 2 (two) times daily.      [name] omeprazole (PRILOSEC) 40 MG capsule Take 1 capsule (40  mg total) by mouth daily.  30 capsule  11  . oxyCODONE (ROXICODONE) 15 MG immediate release tablet 1-2 tablets every 4-6 hrs as needed for pain  100 tablet  0  . Vitamin D, Ergocalciferol, (DRISDOL) 50000 UNITS CAPS Take 50,000 Units by mouth every 7 (seven) days.       No current facility-administered medications on file prior to visit.       Objective:   Physical Exam  Nursing note and vitals reviewed. Constitutional: He is oriented to person, place, and time. He appears well-developed and well-nourished. No distress.  HENT:  Head: Normocephalic and atraumatic.  Left Ear: External ear normal.  Mouth/Throat: Oropharynx is clear and moist.  Eyes: Conjunctivae and EOM are normal. Pupils are equal, round, and reactive to light.  Neck: Normal range of motion. Neck supple. No JVD present. No thyromegaly present.  Cardiovascular: Normal rate, regular rhythm, normal heart sounds and intact distal pulses.  Exam reveals no gallop and no friction rub.   No murmur heard. Pulmonary/Chest: Effort normal and breath sounds normal. He has no wheezes. He has no rales.  Abdominal: Soft. Bowel sounds are normal. He exhibits no distension. There is no tenderness. There is no rebound and no guarding.  Lymphadenopathy:    He has no cervical adenopathy.  Neurological: He is alert and oriented to person, place, and time. No cranial nerve deficit. He exhibits normal  muscle tone. Coordination normal.  Skin: Skin is warm and dry. No rash noted. He is not diaphoretic.  Psychiatric: He has a normal mood and affect. His behavior is normal. Judgment and thought content normal.   EKG:  NSR; no acute ST changes.     Assessment & Plan:  Essential hypertension, benign - Plan: CBC with Differential, Comp Met (CMET)  Other abnormal glucose - Plan: Hemoglobin A1c  Other malaise and fatigue  Insomnia - Plan: amitriptyline (ELAVIL) 10 MG tablet  Panic attack - Plan: EKG 12-Lead  Chest pain   1.  HTN: controlled; no change in medication; obtain labs. 2. Glucose intolerance: stable; obtain labs; continue with dietary modification. 3.  Malaise/fatigue:  New.  Onset in past month; obtain labs.  Treat underlying insomnia and reassess. If persists after treatment of insomnia, recommend sleep study; pt declined referral for sleep study today. 4.  Insomnia: New. Rx for amitriptyline provided. 5.  Chest pain:  New. Associated with possible panic attack at work.  Unchanged EKG; if recurs or develops exertional chest pain, refer to cardiology. 6. Depression with anxiety: stable yet reports recent new onset insomnia and panic attack.  No change in medications; monitor closely.  Meds ordered this encounter  Medications  . aspirin 81 MG tablet    Sig: Take 81 mg by mouth daily.  [name] amitriptyline (ELAVIL) 10 MG tablet    Sig: Take 1-2 tablets (10-20 mg total) by mouth at bedtime as needed for sleep.    Dispense:  60 tablet    Refill:  5

## 2013-07-05 LAB — COMPREHENSIVE METABOLIC PANEL
ALT: 29 U/L (ref 0–53)
Albumin: 4.6 g/dL (ref 3.5–5.2)
Alkaline Phosphatase: 88 U/L (ref 39–117)
Glucose, Bld: 98 mg/dL (ref 70–99)
Potassium: 3.8 mEq/L (ref 3.5–5.3)
Sodium: 137 mEq/L (ref 135–145)
Total Protein: 7 g/dL (ref 6.0–8.3)

## 2013-07-05 LAB — CBC WITH DIFFERENTIAL/PLATELET
Basophils Relative: 1 % (ref 0–1)
Hemoglobin: 16 g/dL (ref 13.0–17.0)
Lymphs Abs: 1.2 10*3/uL (ref 0.7–4.0)
MCHC: 34.6 g/dL (ref 30.0–36.0)
Monocytes Relative: 11 % (ref 3–12)
Neutro Abs: 4 10*3/uL (ref 1.7–7.7)
Neutrophils Relative %: 66 % (ref 43–77)
RBC: 5.77 MIL/uL (ref 4.22–5.81)

## 2013-07-05 LAB — HEMOGLOBIN A1C: Mean Plasma Glucose: 123 mg/dL — ABNORMAL HIGH (ref <117)

## 2013-07-11 ENCOUNTER — Telehealth: Payer: Self-pay | Admitting: *Deleted

## 2013-07-11 MED ORDER — VITAMIN D (ERGOCALCIFEROL) 1.25 MG (50000 UNIT) PO CAPS: 50000.0000 [IU] | ORAL_CAPSULE | ORAL | Status: DC

## 2013-07-11 NOTE — Telephone Encounter (Signed)
Refill sent to pharmacy.   

## 2013-07-11 NOTE — Telephone Encounter (Signed)
Pharmacy requesting refill on on vitamin D.  Last fill 06/06/13

## 2013-07-28 ENCOUNTER — Other Ambulatory Visit (HOSPITAL_COMMUNITY): Payer: Self-pay | Admitting: Orthopedic Surgery

## 2013-07-28 ENCOUNTER — Encounter (INDEPENDENT_AMBULATORY_CARE_PROVIDER_SITE_OTHER): Payer: BC Managed Care – PPO

## 2013-07-28 DIAGNOSIS — M79662 Pain in left lower leg: Secondary | ICD-10-CM

## 2013-07-28 DIAGNOSIS — R609 Edema, unspecified: Secondary | ICD-10-CM

## 2013-07-28 DIAGNOSIS — M7989 Other specified soft tissue disorders: Secondary | ICD-10-CM

## 2013-07-28 DIAGNOSIS — R52 Pain, unspecified: Secondary | ICD-10-CM

## 2013-07-28 DIAGNOSIS — M79609 Pain in unspecified limb: Secondary | ICD-10-CM

## 2013-07-29 ENCOUNTER — Ambulatory Visit (HOSPITAL_COMMUNITY)
Admission: RE | Admit: 2013-07-29 | Discharge: 2013-07-29 | Disposition: A | Discharge status: Home / Self Care | Payer: BC Managed Care – PPO | Source: Ambulatory Visit | Attending: Orthopedic Surgery | Admitting: Orthopedic Surgery

## 2013-07-29 ENCOUNTER — Other Ambulatory Visit (HOSPITAL_COMMUNITY): Payer: Self-pay | Admitting: Orthopedic Surgery

## 2013-07-29 DIAGNOSIS — Z1389 Encounter for screening for other disorder: Secondary | ICD-10-CM | POA: Insufficient documentation

## 2013-07-29 DIAGNOSIS — M7989 Other specified soft tissue disorders: Secondary | ICD-10-CM

## 2013-07-29 DIAGNOSIS — M25469 Effusion, unspecified knee: Secondary | ICD-10-CM | POA: Insufficient documentation

## 2013-07-29 DIAGNOSIS — T1591XA Foreign body on external eye, part unspecified, right eye, initial encounter: Secondary | ICD-10-CM

## 2013-07-29 DIAGNOSIS — M79662 Pain in left lower leg: Secondary | ICD-10-CM

## 2013-07-29 DIAGNOSIS — M79609 Pain in unspecified limb: Secondary | ICD-10-CM | POA: Insufficient documentation

## 2013-07-29 MED ORDER — GADOBENATE DIMEGLUMINE 529 MG/ML IV SOLN
20.0000 mL | Freq: Once | INTRAVENOUS | Status: AC | PRN
  Administered 2013-07-29: 17 mL via INTRAVENOUS

## 2013-11-07 ENCOUNTER — Ambulatory Visit: Payer: BC Managed Care – PPO | Admitting: Family Medicine

## 2013-11-21 ENCOUNTER — Ambulatory Visit (INDEPENDENT_AMBULATORY_CARE_PROVIDER_SITE_OTHER): Payer: BC Managed Care – PPO | Admitting: Family Medicine

## 2013-11-21 ENCOUNTER — Encounter: Payer: Self-pay | Admitting: Family Medicine

## 2013-11-21 VITALS — BP 134/75 | HR 93 | Temp 98.0°F | Resp 16 | Ht 66.0 in | Wt 187.0 lb

## 2013-11-21 DIAGNOSIS — F329 Major depressive disorder, single episode, unspecified: Secondary | ICD-10-CM

## 2013-11-21 DIAGNOSIS — G4700 Insomnia, unspecified: Secondary | ICD-10-CM

## 2013-11-21 DIAGNOSIS — R7309 Other abnormal glucose: Secondary | ICD-10-CM

## 2013-11-21 DIAGNOSIS — Z23 Encounter for immunization: Secondary | ICD-10-CM

## 2013-11-21 DIAGNOSIS — I10 Essential (primary) hypertension: Secondary | ICD-10-CM

## 2013-11-21 MED ORDER — METHOCARBAMOL 750 MG PO TABS: 750.0000 mg | ORAL_TABLET | Freq: Every day | ORAL | Status: DC

## 2013-11-21 MED ORDER — MELOXICAM 15 MG PO TABS: 15.0000 mg | ORAL_TABLET | Freq: Every day | ORAL | Status: DC

## 2013-11-21 NOTE — Progress Notes (Signed)
Subjective:   This chart was scribed for Ethelda Chick, MD by Arlan Organ, ED Scribe. This patient was seen in room Room 21 and the patient's care was started 3:20 PM.    Patient ID: David Weaver, male    DOB: 1951/01/22, 62 y.o.   MRN: [id number]  HPI HPI Comments: David Weaver is a 62 y.o. Male with a h/o osteoarthrosis who presents to [facility] seeking follow up today. Pt states he hasn't lost any weight, but has been working out regularly with his son. He says he has been lifting weights, and walking on the treadmill. He says it "makes him feel really well".   He denies any emotional issues. He states he has been sleeping well. He states he has been taking his amitriptyline as directed. He says it helps him sleep, but makes him "feel bad" in the morning.   He states he is due to follow up with a pain clinic for his bilateral knee pain. He says he was told the pain may be originating from his back. He states his stomach is doing well. He reports taking 1 oxycodone a day, which he says is suffice. He reports having a Flu vaccination today. He says he has seen Dr. Eulah Pont about 2 weeks ago regarding his knees. He states X-Ray were performed with no abnormal findings. He says if he is unable to sleep, he consumed 1 pint of liquor. He says it can range from 1-5 times a week depending on his sleep pattern. He says he is a little concerned because he has not been able to loose a lot of weight. He states he has eaten a small amount today. He reports seeing Dr. Corliss Skains about every 6 months. He reports pain to his right shoulder, along with some neck soreness. He denies this pain originating from exercising. He states it may be related to the way he is sleeping. He denies CP, or leg swelling.  Past Medical History  Diagnosis Date  . Other abnormal glucose   . Allergic rhinitis, cause unspecified   . Osteoarthrosis, unspecified whether generalized or localized, lower leg   . Other specified disorder  of male genital organs(608.89)   . Other malaise and fatigue   . Alcohol abuse, daily use   . Essential hypertension, benign   . Esophageal reflux   . Lupus erythematosus   . Unspecified hearing loss   . Insomnia, unspecified   . Unspecified vitamin D deficiency   . Left knee DJD   . Shortness of breath   . Fibromyalgia     followed by [name] every six months.  . Tobacco use disorder     quit smokeing 2012  . Dysthymic disorder   . Impotence of organic origin       History   Social History  . Marital Status: Married    Spouse Name: N/A    Number of Children: 1  . Years of Education: N/A   Occupational History  . [employer]     Paints cars/trucks   Social History Main Topics  . Smoking status: Former Smoker -- 2.00 packs/day for 30 years    Types: Cigarettes    Quit date: 12/28/2008  . Smokeless tobacco: Never Used     Comment: quit 12 /2011  . Alcohol Use: 5.6 oz/week    6 Cans of beer, 4 Drinks containing 0.5 oz of alcohol per week     Comment: 2 drinks of liquor, every afternoon  12/28/2012  wekends 4 5 beers  . Drug Use: No  . Sexual Activity: Yes   Other Topics Concern  . Not on file   Social History Narrative   Guns in the home stored in locked cabinet.       Caffeine use: 1 serving/ day.     Marital status:  Married x 18 years, third marriage, happily married.      Children: one son (58); one step-daughter; two grandsons      Lives: with wife, son.      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      Tobacco: none currently; quit 2013.  Smoked x 40 years.      Alcohol:  1 pint per night in 03/2014; son is alcoholic.        Drugs: none      Exercise:  Gym membership in 2015; 2-3 nights per week.      Seatbelt: 100%     Past Surgical History  Procedure Laterality Date  . Cholecystectomy    . Carpal tunnel release      bilateral  . Ganglion cyst excision  1980    Left wrist  . Knee  arthroscopy  2008    Right  . Lumbar spine epidural  2009    [facility]  . Rotator cuff repair  10/2008    right  . Total knee arthroplasty  12/27/2012    Procedure: TOTAL KNEE ARTHROPLASTY;  Surgeon: Nilda Simmer, MD;  Location: [facility] OR;  Service: Orthopedics;  Laterality: Right;  RIGHT ARTHROPLASTY KNEE MEDIAL AND LATERAL COMPARTMENTS WITH PATELLA RESURFACING, RIGHT TOTAL KNEE REPLACEMENT  . Total knee arthroplasty Left 01/31/2013    Procedure: TOTAL KNEE ARTHROPLASTY;  Surgeon: Nilda Simmer, MD;  Location: [facility] OR;  Service: Orthopedics;  Laterality: Left;  . Joint replacement  01/2013    right knee, left knee. [facility].  . Colonoscopy  03/01/2014    two polyps; Dr. Elnoria Howard.  Repeat 5 years.    Review of Systems  Constitutional: Negative for fever, chills, diaphoresis, activity change, appetite change and fatigue.  Eyes: Negative for visual disturbance.  Respiratory: Negative for cough and shortness of breath.   Cardiovascular: Negative for chest pain, palpitations and leg swelling.  Gastrointestinal: Negative for nausea, vomiting, abdominal pain, diarrhea, constipation and blood in stool.  Endocrine: Negative for cold intolerance, heat intolerance, polydipsia, polyphagia and polyuria.  Musculoskeletal: Positive for arthralgias, myalgias, neck pain and neck stiffness.  Neurological: Negative for dizziness, tremors, seizures, syncope, facial asymmetry, speech difficulty, weakness, light-headedness, numbness and headaches.  Psychiatric/Behavioral: Positive for sleep disturbance. Negative for dysphoric mood. The patient is not nervous/anxious.   All other systems reviewed and are negative.      Objective:   Physical Exam  Nursing note and vitals reviewed. Constitutional: He is oriented to person, place, and time. He appears well-developed and well-nourished.  HENT:  Head: Normocephalic and atraumatic.  Eyes: Conjunctivae and EOM are normal. Pupils are equal, round, and reactive to  light.  Neck: Normal range of motion. Neck supple.  Cardiovascular: Normal rate, regular rhythm, normal heart sounds and intact distal pulses.   Pulmonary/Chest: Effort normal and breath sounds normal. No respiratory distress. He has no wheezes. He has no rales.  Abdominal: Soft. He exhibits no distension. There is no tenderness.  Musculoskeletal: Normal range of motion. He exhibits tenderness.  Tenderness to palpation to right trapezius muscle  Neurological: He  is alert and oriented to person, place, and time.  Skin: Skin is warm and dry.  Psychiatric: He has a normal mood and affect. Judgment normal.   INFLUENZA VACCINE ADMINISTERED.     Assessment & Plan:  Other abnormal glucose - Plan: Hemoglobin A1c  Need for prophylactic vaccination and inoculation against influenza - Plan: Flu Vaccine QUAD 36+ mos PF IM (Fluarix)  Depression  Insomnia, unspecified  Essential hypertension, benign  1. Glucose intolerance: stable with dietary modification; pt frustrated with lack of weight loss; obtain labs. 2.  S/p flu vaccine. 3.  Depression: controlled; continue current medications. 4.  Insomnia: moderately controlled with Amitriptyline. 5.  HTN: controlled; no changes in therapy.  6. OA knees and DDD cervical spine: chronic pain; followed by ortho and [name]; also followed by the pain clinic; rx for Mobic and Robaxin provided.  Meds ordered this encounter  Medications  . DISCONTD: meloxicam (MOBIC) 15 MG tablet    Sig: Take 1 tablet (15 mg total) by mouth daily.    Dispense:  30 tablet    Refill:  0  . DISCONTD: methocarbamol (ROBAXIN) 750 MG tablet    Sig: Take 1 tablet (750 mg total) by mouth at bedtime.    Dispense:  30 tablet    Refill:  0   I personally performed the services described in this documentation, which was scribed in my presence.  The recorded information has been reviewed and is accurate.  Nilda Simmer, M.D.  [facility] [address] [place], [place]  [address] [phone] phone [phone] fax

## 2013-12-29 ENCOUNTER — Ambulatory Visit (INDEPENDENT_AMBULATORY_CARE_PROVIDER_SITE_OTHER): Payer: BC Managed Care – PPO | Admitting: Family Medicine

## 2013-12-29 VITALS — BP 140/84 | HR 95 | Temp 97.6°F | Resp 16 | Ht 66.5 in | Wt 189.0 lb

## 2013-12-29 DIAGNOSIS — R5383 Other fatigue: Secondary | ICD-10-CM

## 2013-12-29 DIAGNOSIS — M542 Cervicalgia: Secondary | ICD-10-CM

## 2013-12-29 DIAGNOSIS — M545 Low back pain, unspecified: Secondary | ICD-10-CM

## 2013-12-29 DIAGNOSIS — R5381 Other malaise: Secondary | ICD-10-CM

## 2013-12-29 DIAGNOSIS — M25519 Pain in unspecified shoulder: Secondary | ICD-10-CM

## 2013-12-29 MED ORDER — MELOXICAM 15 MG PO TABS: 15.0000 mg | ORAL_TABLET | Freq: Every day | ORAL | Status: DC

## 2013-12-29 MED ORDER — DICLOFENAC SODIUM 1 % TD GEL: 2.0000 g | Freq: Four times a day (QID) | TRANSDERMAL | Status: DC

## 2013-12-29 MED ORDER — OXYCODONE HCL 5 MG PO TABS: ORAL_TABLET | ORAL | Status: DC

## 2013-12-29 NOTE — Progress Notes (Signed)
Subjective:    Patient ID: David Weaver, male    DOB: 13-Jul-1951, 63 y.o.   MRN: [id number]  HPI This chart was scribed for Kristi Smith-MD by Celesta Gentile, Scribe. This patient was seen in room 13 and the patient's care was started at 7:31 PM.  HPI Comments: David Weaver is a 63 y.o. male who presents to the [facility] complaining of reoccurring severe back and neck pain.  Pt states that while sitting here his hips, back, and neck are hurting him.  Pt states that the hip pain is directly between his butt cheeks.  Pt states that if he bends down or kneels down he can't get back up easily.  Wife states that the pain has changed his personality.  Pt states that pain management has had his file since December 11th, but they won't do anything about his pain until he has an appointment.  Dr. Arnoldo Morale states that he does have back issues, but nothing that requires surgery.  NS said that since he doesn't require surgery, pt needs to visit his PCP until can undergo consultation by pain management.  S/p myelogram in 07/2013 for back pain that radiates into B hips and B knees.   Pt also complains of severe bilateral knee pain.  Dr. Para March stated that his knees were beautiful and that his hip pain isn't from his knees.  Pt denies numbness and tingling in his legs bilaterally.  Pt denies swelling in his knees.  Normal b/b function.  Pt states that he has to take 2 oxycodone to get through the day.  Pt states that he only has a few left. Pt reports that he used Voltaren gel with some relief.  Pt states that he applied the gel everywhere.    Pt reports that since his last visit in 10/2013, he hasn't had any energy.  He states that he is sleeping through the night.  Pt states that he takes a sleeping pill every night.  Pt denies taking naps on the weekends.  Wife states that he lays on the bed a lot, because that is where he watches TV.  Pt states that he takes a muscle relaxer every night as  well.  Wife states that the muscle relaxer and sleeping pill combined might be causing his fatigue.    Pt states he took Meloxicam without relief.  Wife states that the Meloxicam upset his stomach, even when he took it with food.   Pt reports that he goes on Medicare February 1st.  Pt states that he recently had blood work and everything was normal.    Past Surgical History  Procedure Laterality Date   Cholecystectomy     Carpal tunnel release      bilateral   Ganglion cyst excision  1980    Left wrist   Knee arthroscopy  2008    Right   Lumbar spine epidural  2009    [facility]   Rotator cuff repair  10/2008    right   Total knee arthroplasty  12/27/2012    Procedure: TOTAL KNEE ARTHROPLASTY;  Surgeon: [name], MD;  Location: [facility];  Service: Orthopedics;  Laterality: Right;  RIGHT ARTHROPLASTY KNEE MEDIAL AND LATERAL COMPARTMENTS WITH PATELLA RESURFACING, RIGHT TOTAL KNEE REPLACEMENT   Total knee arthroplasty Left 01/31/2013    Procedure: TOTAL KNEE ARTHROPLASTY;  Surgeon: [name], MD;  Location: [facility];  Service: Orthopedics;  Laterality: Left;   Joint  replacement  01/2013    right knee, left knee. [facility].   Colonoscopy  08/01/2010    Family History  Problem Relation Age of Onset   Alzheimer's disease Mother    Osteoporosis Mother    Heart disease Father    Alcohol abuse Father    Cancer Brother     colon   Diabetes Brother     History   Social History   Marital Status: Married    Spouse Name: N/A    Number of Children: 1   Years of Education: N/A   Occupational History   Radio producer cars/trucks   Social History Main Topics   Smoking status: Former Smoker -- 2.00 packs/day for 30 years    Types: Cigarettes    Quit date: 12/28/2008   Smokeless tobacco: Never Used     Comment: quit 12 /2011   Alcohol Use: 5.6 oz/week    6 Cans of beer, 4 Drinks containing 0.5 oz of alcohol per week     Comment: 2 drinks of  liquor, every afternoon    12/28/2012  wekends 4 5 beers   Drug Use: No   Sexual Activity: Yes   Other Topics Concern   Not on file   Social History Narrative   Guns in the home stored in locked cabinet.       Caffeine use: 1 serving/ day.     Marital status:  Married x 17 years, third marriage, happily married.      Children: one son (17); one step-daughter; two grandsons      Lives: with wife, son.      Employment:  Works at 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      Tobacco: none currently; quit 2013.  Smoked x 40 years.      Alcohol:  None since 12/2012; previous heavy drinker; quit drinking when son moved in with him; son is alcoholic.        Drugs: none      Exercise:  None; walking short distances.    No Known Allergies  Patient Active Problem List   Diagnosis Date Noted   Annual physical exam 02/28/2013   Left knee DJD    Allergic rhinitis, cause unspecified    Osteoarthrosis, unspecified whether generalized or localized, lower leg    Alcohol abuse, daily use    Esophageal reflux    Lupus erythematosus    Unspecified hearing loss    Insomnia, unspecified    Right knee DJD    Unspecified vitamin D deficiency    Essential hypertension, benign 11/01/2012   Other abnormal glucose 11/01/2012   Pre-operative clearance 11/01/2012   Depression 11/01/2012   Need for prophylactic vaccination and inoculation against influenza 11/01/2012   Vitamin D deficiency 11/01/2012   Insomnia 11/01/2012    Review of Systems  Constitutional: Positive for fatigue. Negative for fever and chills.  HENT: Negative for congestion and rhinorrhea.   Respiratory: Negative for cough and shortness of breath.   Cardiovascular: Negative for chest pain.  Gastrointestinal: Negative for nausea, vomiting, abdominal pain and diarrhea.  Musculoskeletal: Positive for arthralgias, back pain and neck pain. Negative for joint swelling.  Skin: Negative  for color change and rash.  Neurological: Negative for syncope.       Objective:   Physical Exam  Nursing note and vitals reviewed. Constitutional: He is oriented to person, place, and time. He appears well-developed and well-nourished. No distress.  HENT:  Head: Normocephalic and atraumatic.  Eyes: EOM are normal.  Neck: Neck supple. No tracheal deviation present.  Cardiovascular: Normal rate.   Pulmonary/Chest: Effort normal. No respiratory distress.  Musculoskeletal: Normal range of motion. He exhibits no edema.       Right shoulder: He exhibits pain. He exhibits normal range of motion, no tenderness, no spasm, normal pulse and normal strength.       Left shoulder: He exhibits pain. He exhibits normal range of motion, no tenderness, no spasm and normal strength.       Cervical back: He exhibits pain. He exhibits normal range of motion, no tenderness and no spasm.  Full ROM of right and left hip without pain or limitation; non tender to palpation B hips. Painful ROM of lumbar spine in all directions; toe and heel walking intact; straight leg raises negative. B knees with midline incisions well healed.  Neurological: He is alert and oriented to person, place, and time.  Skin: Skin is warm and dry. No rash noted.  Psychiatric: He has a normal mood and affect. His behavior is normal.   Triage Vitals: BP 140/84   Pulse 95   Temp(Src) 97.6 F (36.4 C)   Resp 16   Ht 5' 6.5" (1.689 m)   Wt 189 lb (85.73 kg)   BMI 30.05 kg/m2   SpO2 96%     Assessment & Plan:   Problem List Items Addressed This Visit   None    Visit Diagnoses   Low back pain    -  Primary    Relevant Medications       oxyCODONE (Oxy IR/ROXICODONE) immediate release tablet       meloxicam (MOBIC) tablet    Neck pain        Pain in joint, shoulder region        Other malaise and fatigue          1.  Low back pain: persistent; awaiting appointment with pain management; has undergone injections of lumbar spine in  the past with benefit.  Rx for Oxycodone 5mg  bid provided. Agreeable to managing pain until undergoes pain management consultation.  Recommend restarting Neurontin 300mg  bid until consultation; rx for Mobic provided to try again as well. 2.  Neck pain:  New.  Recommend C-spine films with Noemi Chapel at next appointment.  Rx for Mobic and Oxycodone provided.  3.  B shoulder pain:  New.  To follow-up with ortho for further evaluation.  Rx for Voltaren gel provided as well. 4.  Malaise and fatigue:  New onset in past month; recommend STOPPING Robaxin at nighttime to see if hypersomnolence and fatigue improves.  If persists, recommend repeat labs.  Neurontin may be sedating as well.  Recommend starting Neurontin at night; may can also stop Amitriptyline.  Meds ordered this encounter  Medications   oxyCODONE (OXY IR/ROXICODONE) 5 MG immediate release tablet    Sig: 1 tablet twice daily for pain    Dispense:  60 tablet    Refill:  0    Order Specific Question:  Supervising Provider    Answer:  Elsie Saas A [[id number]]   diclofenac sodium (VOLTAREN) 1 % GEL    Sig: Apply 2-4 g topically 4 (four) times daily.    Dispense:  100 g    Refill:  11   meloxicam (MOBIC) 15 MG tablet    Sig: Take 1 tablet (15 mg total) by mouth daily.    Dispense:  30 tablet  Refill:  4    I personally performed the services described in this documentation, which was scribed in my presence.  The recorded information has been reviewed and is accurate.  Reginia Forts, M.D.  [facility] [address] [place], [place]  [address] [phone] phone [phone] fax

## 2013-12-29 NOTE — Patient Instructions (Signed)
1.  HOLD MUSCLE RELAXER FOR THE NEXT TWO WEEKS TO SEE IF FATIGUE/LACK OF ENERGY IMPROVES.   2.  START GABAPENTIN/NEURONTIN 300MG  ONE TABLET AT BEDTIME FOR ONE WEEK. THEN CAN INCREASE TO ONE TABLET TWICE DAILY. 3.  IF FATIGUE DOES NOT IMPROVE, RETURN TO OFFICE WITHIN UPCOMING MONTH FOR BLOOD WORK. 4.  CALL DR. Noemi Chapel FOR APPOINTMENT FOR SHOULDER PAIN AND NECK PAIN.

## 2014-02-01 ENCOUNTER — Telehealth: Payer: Self-pay

## 2014-02-01 NOTE — Telephone Encounter (Signed)
Patient's wife states that patient needs a refill on Oxycodone.   [phone]

## 2014-02-02 MED ORDER — OXYCODONE HCL 5 MG PO TABS
ORAL_TABLET | ORAL | Status: DC
Start: 2014-02-02 — End: 2014-02-02

## 2014-02-02 MED ORDER — OXYCODONE HCL 5 MG PO TABS: ORAL_TABLET | ORAL | Status: DC

## 2014-02-02 NOTE — Telephone Encounter (Signed)
Rx Dr [name] printed was not located when she arrived. Rx reprinted per Dr Thompson Caul inst's and signed. Notified wife ready.

## 2014-02-02 NOTE — Telephone Encounter (Signed)
Please call ---- oxycodone will be ready for pick up after 3:00pm today.  I printed rx but will not be in the office to sign until 2:00pm.

## 2014-02-21 DIAGNOSIS — Z1211 Encounter for screening for malignant neoplasm of colon: Secondary | ICD-10-CM | POA: Diagnosis not present

## 2014-02-21 DIAGNOSIS — K219 Gastro-esophageal reflux disease without esophagitis: Secondary | ICD-10-CM | POA: Diagnosis not present

## 2014-02-21 DIAGNOSIS — K625 Hemorrhage of anus and rectum: Secondary | ICD-10-CM | POA: Diagnosis not present

## 2014-02-21 DIAGNOSIS — Z800 Family history of malignant neoplasm of digestive organs: Secondary | ICD-10-CM | POA: Diagnosis not present

## 2014-03-01 HISTORY — PX: COLONOSCOPY: SHX174

## 2014-03-08 ENCOUNTER — Telehealth: Payer: Self-pay

## 2014-03-08 NOTE — Telephone Encounter (Signed)
Patient called requesting a refill on Oxycodone 5 mg.

## 2014-03-10 NOTE — Telephone Encounter (Signed)
Out of office today; please tell patient refill will be ready on Saturday.

## 2014-03-10 NOTE — Telephone Encounter (Signed)
REFILL    oxyCODONE (OXY IR/ROXICODONE) 5 MG immediate release tablet   [phone]

## 2014-03-11 ENCOUNTER — Other Ambulatory Visit: Payer: Self-pay | Admitting: Family Medicine

## 2014-03-11 MED ORDER — OXYCODONE HCL 5 MG PO TABS: ORAL_TABLET | ORAL | Status: DC

## 2014-03-11 NOTE — Telephone Encounter (Signed)
Pt notified- rx in pu drawer.

## 2014-03-14 DIAGNOSIS — D126 Benign neoplasm of colon, unspecified: Secondary | ICD-10-CM | POA: Diagnosis not present

## 2014-03-14 DIAGNOSIS — Z86010 Personal history of colonic polyps: Secondary | ICD-10-CM | POA: Diagnosis not present

## 2014-03-14 DIAGNOSIS — K5730 Diverticulosis of large intestine without perforation or abscess without bleeding: Secondary | ICD-10-CM | POA: Diagnosis not present

## 2014-03-14 DIAGNOSIS — Z1211 Encounter for screening for malignant neoplasm of colon: Secondary | ICD-10-CM | POA: Diagnosis not present

## 2014-03-14 LAB — HM COLONOSCOPY

## 2014-03-17 ENCOUNTER — Other Ambulatory Visit: Payer: Self-pay | Admitting: Family Medicine

## 2014-03-19 ENCOUNTER — Other Ambulatory Visit: Payer: Self-pay | Admitting: Family Medicine

## 2014-03-21 ENCOUNTER — Encounter: Payer: Self-pay | Admitting: *Deleted

## 2014-03-27 ENCOUNTER — Other Ambulatory Visit: Payer: Self-pay | Admitting: Family Medicine

## 2014-03-27 NOTE — Telephone Encounter (Signed)
Dr [name], pt is not due for 6 mos check up until June, but don't see GERD addressed at last OV. Can we RF?

## 2014-03-30 DIAGNOSIS — M25519 Pain in unspecified shoulder: Secondary | ICD-10-CM | POA: Diagnosis not present

## 2014-03-30 DIAGNOSIS — M67919 Unspecified disorder of synovium and tendon, unspecified shoulder: Secondary | ICD-10-CM | POA: Diagnosis not present

## 2014-04-03 ENCOUNTER — Ambulatory Visit (INDEPENDENT_AMBULATORY_CARE_PROVIDER_SITE_OTHER): Payer: Medicare Other | Admitting: Family Medicine

## 2014-04-03 ENCOUNTER — Other Ambulatory Visit: Payer: Self-pay | Admitting: Family Medicine

## 2014-04-03 ENCOUNTER — Encounter: Payer: Self-pay | Admitting: Family Medicine

## 2014-04-03 VITALS — BP 140/92 | HR 87 | Temp 97.7°F | Resp 16 | Ht 66.0 in | Wt 178.6 lb

## 2014-04-03 DIAGNOSIS — Z0000 Encounter for general adult medical examination without abnormal findings: Secondary | ICD-10-CM

## 2014-04-03 DIAGNOSIS — R7309 Other abnormal glucose: Secondary | ICD-10-CM

## 2014-04-03 DIAGNOSIS — E7800 Pure hypercholesterolemia, unspecified: Secondary | ICD-10-CM

## 2014-04-03 DIAGNOSIS — F329 Major depressive disorder, single episode, unspecified: Secondary | ICD-10-CM

## 2014-04-03 DIAGNOSIS — F32A Depression, unspecified: Secondary | ICD-10-CM

## 2014-04-03 DIAGNOSIS — M1712 Unilateral primary osteoarthritis, left knee: Secondary | ICD-10-CM

## 2014-04-03 DIAGNOSIS — Z125 Encounter for screening for malignant neoplasm of prostate: Secondary | ICD-10-CM

## 2014-04-03 DIAGNOSIS — I10 Essential (primary) hypertension: Secondary | ICD-10-CM

## 2014-04-03 DIAGNOSIS — G4700 Insomnia, unspecified: Secondary | ICD-10-CM

## 2014-04-03 DIAGNOSIS — H9190 Unspecified hearing loss, unspecified ear: Secondary | ICD-10-CM

## 2014-04-03 LAB — COMPLETE METABOLIC PANEL WITH GFR
ALK PHOS: 63 U/L (ref 39–117)
ALT: 31 U/L (ref 0–53)
AST: 18 U/L (ref 0–37)
Albumin: 4.5 g/dL (ref 3.5–5.2)
BILIRUBIN TOTAL: 0.7 mg/dL (ref 0.2–1.2)
BUN: 14 mg/dL (ref 6–23)
CO2: 25 mEq/L (ref 19–32)
Calcium: 9.1 mg/dL (ref 8.4–10.5)
Chloride: 101 mEq/L (ref 96–112)
Creat: 0.61 mg/dL (ref 0.50–1.35)
GFR, Est African American: >89 mL/min
GLUCOSE: 98 mg/dL (ref 70–99)
Potassium: 4 mEq/L (ref 3.5–5.3)
SODIUM: 136 meq/L (ref 135–145)
TOTAL PROTEIN: 7 g/dL (ref 6.0–8.3)

## 2014-04-03 LAB — POCT URINALYSIS DIPSTICK
Bilirubin, UA: NEGATIVE
Blood, UA: NEGATIVE
Glucose, UA: NEGATIVE
KETONES UA: NEGATIVE
Leukocytes, UA: NEGATIVE
Nitrite, UA: NEGATIVE
PROTEIN UA: NEGATIVE
SPEC GRAV UA: 1.02
Urobilinogen, UA: 0.2
pH, UA: 7

## 2014-04-03 LAB — LIPID PANEL
CHOL/HDL RATIO: 3.7 ratio
CHOLESTEROL: 219 mg/dL — AB (ref 0–200)
HDL: 60 mg/dL (ref >39)
LDL Cholesterol: 104 mg/dL — ABNORMAL HIGH (ref 0–99)
Triglycerides: 275 mg/dL — ABNORMAL HIGH (ref <150)
VLDL: 55 mg/dL — ABNORMAL HIGH (ref 0–40)

## 2014-04-03 LAB — CBC WITH DIFFERENTIAL/PLATELET
BASOS ABS: 0.1 10*3/uL (ref 0.0–0.1)
BASOS PCT: 1 % (ref 0–1)
EOS ABS: 0.2 10*3/uL (ref 0.0–0.7)
Eosinophils Relative: 3 % (ref 0–5)
HCT: 45.2 % (ref 39.0–52.0)
Hemoglobin: 15.9 g/dL (ref 13.0–17.0)
Lymphocytes Relative: 18 % (ref 12–46)
Lymphs Abs: 1.1 10*3/uL (ref 0.7–4.0)
MCH: 28.8 pg (ref 26.0–34.0)
MCHC: 35.2 g/dL (ref 30.0–36.0)
MCV: 81.9 fL (ref 78.0–100.0)
Monocytes Absolute: 0.7 10*3/uL (ref 0.1–1.0)
Monocytes Relative: 12 % (ref 3–12)
NEUTROS PCT: 66 % (ref 43–77)
Neutro Abs: 4 10*3/uL (ref 1.7–7.7)
PLATELETS: 298 10*3/uL (ref 150–400)
RBC: 5.52 MIL/uL (ref 4.22–5.81)
RDW: 14.7 % (ref 11.5–15.5)
WBC: 6.1 10*3/uL (ref 4.0–10.5)

## 2014-04-03 LAB — TSH: TSH: 4.77 u[IU]/mL — ABNORMAL HIGH (ref 0.350–4.500)

## 2014-04-03 LAB — HEMOGLOBIN A1C
Hgb A1c MFr Bld: 5.9 % — ABNORMAL HIGH (ref <5.7)
MEAN PLASMA GLUCOSE: 123 mg/dL — AB (ref <117)

## 2014-04-03 MED ORDER — AMLODIPINE BESYLATE 10 MG PO TABS: 10.0000 mg | ORAL_TABLET | Freq: Every day | ORAL | Status: DC

## 2014-04-03 MED ORDER — OMEPRAZOLE 40 MG PO CPDR: 40.0000 mg | DELAYED_RELEASE_CAPSULE | Freq: Every day | ORAL | Status: DC

## 2014-04-03 MED ORDER — OXYCODONE HCL 5 MG PO TABS: ORAL_TABLET | ORAL | Status: DC

## 2014-04-03 MED ORDER — DULOXETINE HCL 60 MG PO CPEP: 60.0000 mg | ORAL_CAPSULE | Freq: Two times a day (BID) | ORAL | Status: DC

## 2014-04-03 MED ORDER — TRAZODONE HCL 50 MG PO TABS: 50.0000 mg | ORAL_TABLET | Freq: Every evening | ORAL | Status: DC | PRN

## 2014-04-03 NOTE — Progress Notes (Signed)
Subjective:   This chart was scribed for David Honour, MD by David Weaver, [facility] Scribe. This patient was seen in room 22 and the patient's care was started 8:39 AM.    Patient ID: David Weaver, male    DOB: 11-25-1951, 63 y.o.   MRN: [id number]  04/03/2014  Annual Exam   HPI  HPI Comments: David Weaver is a 63 y.o. male who presents to [facility] for a complete physical examination today.  Welcome to [facility] Physical examination.  Last seen 12/29/13 for chronic pain in lower back, neck, and shoulders. At time of visit, agreed to refill oxycodone until he could establish with pain management clinic. States he hopes to get in by the end of this month with pain management.  Needs refill of oxycodone.  Pt states he has been checking his blood pressure and says it has been running high. He reports 237-62 diastolic in the last few days. Says his blood pressure is typically higher in the morning time.  Usually takes Amlodipine every morning.    He reports intermittent pain to the medial L foot. Describes the discomfort as "sore". Pt states at times this pain is intense enough to make ambulation very difficult and painful.  Admits to mild diarrhea. However, he states this is baseline for Weaver due to drinking.   Pt also reports a few episodes of blurred vision with associated dizziness. States he has only experienced this a few times in the last year.  States he is getting up about 1 time every night to use the rest room. He states he has not noted any differences in his stream or urine. He goes to bed around 10 PM every evening and wakes up around 6 AM every morning.  Pt denies any smoking, but consumes about 1 pint of alcohol a day. However, he states he does not do this every evening, only a few nights throughout the week. States his wife is encouraging Weaver to quit.   He is currently working out about 2-3 times a week.  Mother died at 57 of  Alzeimer's and osteoporosis. Father died at 42 of a heart attack. Sister is still living and is currently in her 88's. Brother died of liver complications; was diagnosed with colon cancer at a young age. Older brother has diabetes and was advised to go on dialysis, but he declined. Younger brother is in good health.  Pt states he currently works 50 hours a week. He says he does not plan to retire anytime soon.  Pt has been happily married for 18 years now. Son is 34 and is currently living with Weaver. Pt states son plans to be out of the home soon and hopes to establish with a group home; has been drinking again.  Wears his seatbelt every time while operating a motor vehicle.  He is currently taking: Norvasc, Cymbalta, Plaquenil, Prilosec, oxycodone, and vitamin D supplement.  At this time he denies any HA, visual disturbances, tinnitus, chest pain, SOB, abdominal pain, nausea, vomiting, cough, numbness, or tingling. No mouth sores. No history of skin cancer.  No other concerns this visit.  Last Physical Exam: 02/28/13 Last Colonoscopy: 2015, states 2 polyps were found; repeat 5 years. Hung.   Last Dental Visit: 2015 Last Eye Exam: 2014. He is followed by Dr. Juanda Crumble Burn Last Pneumovax: 02/01/2008 Last TDap: 02/03/12 Last Zostavax: 2012 Flu Shot: 10/2013   Review of Systems  Constitutional: Negative  for fever, chills, activity change, appetite change and fatigue.  HENT: Negative for congestion, mouth sores, rhinorrhea, sinus pressure, sneezing, sore throat, tinnitus, trouble swallowing and voice change.   Eyes: Negative for photophobia, pain, discharge, redness, itching and visual disturbance.  Respiratory: Negative for cough, shortness of breath, wheezing and stridor.   Cardiovascular: Negative for chest pain, palpitations and leg swelling.  Gastrointestinal: Positive for diarrhea. Negative for nausea, vomiting, abdominal pain, constipation, blood in stool, abdominal distention, anal  bleeding and rectal pain.  Endocrine: Negative for cold intolerance, heat intolerance, polydipsia, polyphagia and polyuria.  Genitourinary: Positive for testicular pain. Negative for dysuria, urgency, frequency, hematuria, flank pain, difficulty urinating and genital sores.  Musculoskeletal: Positive for arthralgias, back pain and neck pain. Negative for gait problem, joint swelling, myalgias and neck stiffness.  Skin: Negative for color change, pallor, rash and wound.  Neurological: Negative for dizziness, tremors, seizures, syncope, facial asymmetry, speech difficulty, weakness, light-headedness, numbness and headaches.  Psychiatric/Behavioral: Positive for sleep disturbance. Negative for suicidal ideas, confusion, self-injury, dysphoric mood and decreased concentration. The patient is not nervous/anxious.     Past Medical History  Diagnosis Date  . Other abnormal glucose   . Allergic rhinitis, cause unspecified   . Osteoarthrosis, unspecified whether generalized or localized, lower leg   . Other specified disorder of male genital organs(608.89)   . Other malaise and fatigue   . Alcohol abuse, daily use   . Essential hypertension, benign   . Esophageal reflux   . Lupus erythematosus   . Unspecified hearing loss   . Insomnia, unspecified   . Unspecified vitamin D deficiency   . Left knee DJD   . Shortness of breath   . Fibromyalgia     followed by [name] every six months.  . Tobacco use disorder     quit smokeing 2012  . Dysthymic disorder   . Impotence of organic origin    No Known Allergies Current Outpatient Prescriptions  Medication Sig Dispense Refill  . amLODipine (NORVASC) 10 MG tablet TAKE 1 TABLET BY MOUTH DAILY  30 tablet  0  . aspirin 81 MG tablet Take 81 mg by mouth daily.      . DULoxetine (CYMBALTA) 60 MG capsule TAKE 1 CAPSULE BY MOUTH 2 TIMES DAILY.  60 capsule  1  . hydroxychloroquine (PLAQUENIL) 200 MG tablet Take 200 mg by mouth 2 (two) times daily.        [name] omeprazole (PRILOSEC) 40 MG capsule Take 1 capsule (40 mg total) by mouth daily.  30 capsule  11  . oxyCODONE (OXY IR/ROXICODONE) 5 MG immediate release tablet 1 tablet twice daily for pain  60 tablet  0  . Vitamin D, Ergocalciferol, (DRISDOL) 50000 UNITS CAPS capsule Take 1 capsule (50,000 Units total) by mouth every 7 (seven) days.  4 capsule  12  . amitriptyline (ELAVIL) 10 MG tablet Take 1-2 tablets (10-20 mg total) by mouth at bedtime as needed for sleep.  60 tablet  5  . diclofenac sodium (VOLTAREN) 1 % GEL Apply 2-4 g topically 4 (four) times daily.  100 g  11  . meloxicam (MOBIC) 15 MG tablet Take 1 tablet (15 mg total) by mouth daily.  30 tablet  4  . traZODone (DESYREL) 50 MG tablet Take 1-2 tablets (50-100 mg total) by mouth at bedtime as needed for sleep.  60 tablet  11   No current facility-administered medications for this visit.   History   Social History  . Marital Status: Married  Spouse Name: N/A    Number of Children: 1  . Years of Education: N/A   Occupational History  . [employer]     Paints cars/trucks   Social History Main Topics  . Smoking status: Former Smoker -- 2.00 packs/day for 30 years    Types: Cigarettes    Quit date: 12/28/2008  . Smokeless tobacco: Never Used     Comment: quit 12 /2011  . Alcohol Use: 5.6 oz/week    6 Cans of beer, 4 Drinks containing 0.5 oz of alcohol per week     Comment: 2 drinks of liquor, every afternoon    12/28/2012  wekends 4 5 beers  . Drug Use: No  . Sexual Activity: Yes   Other Topics Concern  . Not on file   Social History Narrative   Guns in the home stored in locked cabinet.       Caffeine use: 1 serving/ day.     Marital status:  Married x 18 years, third marriage, happily married.      Children: one son (56); one step-daughter; two grandsons      Lives: with wife, son.      Employment:  Works at 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      Tobacco: none currently; quit  2013.  Smoked x 40 years.      Alcohol:  1 pint per night in [id number]; son is alcoholic.        Drugs: none      Exercise:  Gym membership in 2015; 2-3 nights per week.      Seatbelt: 100%   Family History  Problem Relation Age of Onset  . Alzheimer's disease Mother   . Osteoporosis Mother   . Heart disease Father 48    AMI  . Alcohol abuse Father   . Cancer Brother     colon cancer  . Diabetes Brother   . Kidney failure Brother        Objective:    BP 140/92  Pulse 87  Temp(Src) 97.7 F (36.5 C)  Resp 16  Ht 5\' 6"  (1.676 m)  Wt 178 lb 9.6 oz (81.012 kg)  BMI 28.84 kg/m2  SpO2 97%  Physical Exam  Nursing note and vitals reviewed. Constitutional: He is oriented to person, place, and time. He appears well-developed and well-nourished. No distress.  HENT:  Head: Normocephalic and atraumatic.  Right Ear: Hearing, tympanic membrane, external ear and ear canal normal.  Left Ear: Hearing, tympanic membrane, external ear and ear canal normal.  Nose: Nose normal.  Mouth/Throat: Oropharynx is clear and moist. No oropharyngeal exudate.  Eyes: Conjunctivae and EOM are normal. Pupils are equal, round, and reactive to light. Right eye exhibits no discharge. Left eye exhibits no discharge. No scleral icterus.  Neck: Trachea normal and normal range of motion. Neck supple. Carotid bruit is not present. No mass and no thyromegaly present.  Cardiovascular: Normal rate, regular rhythm, normal heart sounds and intact distal pulses.  Exam reveals no gallop and no friction rub.   No murmur heard. Pulmonary/Chest: Effort normal and breath sounds normal. No respiratory distress. He has no wheezes. He has no rales.  Abdominal: Soft. Bowel sounds are normal. He exhibits no distension and no mass. There is no tenderness. There is no rebound and no guarding. Hernia confirmed negative in the right inguinal area and confirmed negative in the left inguinal area.  Genitourinary: Prostate normal and penis  normal.  Right testis shows no mass, no swelling and no tenderness. Left testis shows no mass and no tenderness. Circumcised.  L testicular atrophy.  Musculoskeletal: Normal range of motion. He exhibits tenderness (Chronic bilateral shoulder, neck, and back tenderness). He exhibits no edema.  Lymphadenopathy:    He has no cervical adenopathy.  Neurological: He is alert and oriented to person, place, and time. He has normal reflexes.  Skin: Skin is warm and dry. He is not diaphoretic.  Psychiatric: He has a normal mood and affect. His behavior is normal. Judgment and thought content normal.   Results for orders placed in visit on 04/03/14  POCT URINALYSIS DIPSTICK      Result Value Ref Range   Color, UA yellow     Clarity, UA clear     Glucose, UA neg     Bilirubin, UA neg     Ketones, UA neg     Spec Grav, UA 1.020     Blood, UA neg     pH, UA 7.0     Protein, UA neg     Urobilinogen, UA 0.2     Nitrite, UA neg     Leukocytes, UA Negative     EKG:  Irregular ectopic atrial rhythm.    Assessment & Plan:  Special screening for malignant neoplasm of prostate  Routine general medical examination at a health care facility  Pure hypercholesterolemia - Plan: Lipid panel  Other abnormal glucose - Plan: Hemoglobin A1c  Essential hypertension, benign - Plan: CBC with Differential, COMPLETE METABOLIC PANEL WITH GFR, TSH, POCT urinalysis dipstick, EKG 12-Lead  Screening for prostate cancer  Depression  Insomnia  Unspecified hearing loss  Left knee DJD  1. Welcome to Medicare CPE: anticipatory guidance ---- alcohol limitation, weight loss, exercise.  Colonoscopy UTD.  Immunizations UTD; will warrant Prevnar at next visit or Annual Visit.  +known hearing loss; no hearing aides.  Undergoing treatment for depression. Low fall risks.  Independent with ADLs. 2.  Screening prostate cancer: completed DRE.  Obtain PSA. 3.  Hyperlipidemia: stable; obtain labs.  Continue with dietary  modification, weight loss, exercise. 4.  Glucose intolerance: stable; continue with dietary modification, exercise, weight loss. 5. HTN: recently elevated readings diastolic.  Pt desires to monitor for six months; if remains elevated at next visit, will need to add a second agent. 6.  Depression: controlled with Cymbalta 120mg  daily. 7.  Insomnia: controlled with Trazodone; rx provided. 8.  Chronic pain syndrome: stable; refill of oxycodone provided; appointment this month with pain management.   Meds ordered this encounter  Medications  . oxyCODONE (OXY IR/ROXICODONE) 5 MG immediate release tablet    Sig: 1 tablet twice daily for pain    Dispense:  60 tablet    Refill:  0    Order Specific Question:  Supervising Provider    Answer:  Elsie Saas A [[id number]]  . traZODone (DESYREL) 50 MG tablet    Sig: Take 1-2 tablets (50-100 mg total) by mouth at bedtime as needed for sleep.    Dispense:  60 tablet    Refill:  11    Return in about 6 months (around 10/04/2014) for recheck high blood pressure.    I personally performed the services described in this documentation, which was scribed in my presence. The recorded information has been reviewed and is accurate.   Reginia Forts, M.D.  [facility] [address] [place], [place]  [address] [phone] phone [phone] fax

## 2014-04-05 ENCOUNTER — Other Ambulatory Visit: Payer: Self-pay | Admitting: Family Medicine

## 2014-04-05 NOTE — Telephone Encounter (Signed)
Dr [name], I don't see that we have Rxd for pt before, but you did just see pt 04/03/14. Please review.

## 2014-04-05 NOTE — Telephone Encounter (Signed)
I have never prescribed for patient.  Refill not authorized.

## 2014-04-06 LAB — PSA: PSA: 0.74 ng/mL (ref <=4.00)

## 2014-04-11 ENCOUNTER — Telehealth: Payer: Self-pay

## 2014-04-11 MED ORDER — METHOCARBAMOL 500 MG PO TABS: 500.0000 mg | ORAL_TABLET | Freq: Three times a day (TID) | ORAL | Status: DC | PRN

## 2014-04-11 NOTE — Telephone Encounter (Signed)
SMITH - Pt's wife called requesting a refill for Haston's muscle relaxer. She said he is having back pain and it has been a while since these have been filled.  She also said the trazodone was filled, but thought it was for sleep only.  Please call [phone]

## 2014-04-11 NOTE — Telephone Encounter (Signed)
Call -- sent rx for Robaxin to pharmacy. This was only muscle relaxer that I have written for him recently.

## 2014-04-12 NOTE — Telephone Encounter (Signed)
Called patient and advised that RX was sent to pharmacy.

## 2014-04-20 ENCOUNTER — Ambulatory Visit (INDEPENDENT_AMBULATORY_CARE_PROVIDER_SITE_OTHER): Payer: Medicare Other

## 2014-04-20 ENCOUNTER — Encounter: Payer: Self-pay | Admitting: Podiatry

## 2014-04-20 ENCOUNTER — Ambulatory Visit (INDEPENDENT_AMBULATORY_CARE_PROVIDER_SITE_OTHER): Payer: Medicare Other | Admitting: Podiatry

## 2014-04-20 VITALS — BP 155/86 | HR 96 | Resp 16

## 2014-04-20 DIAGNOSIS — R52 Pain, unspecified: Secondary | ICD-10-CM

## 2014-04-20 DIAGNOSIS — M7750 Other enthesopathy of unspecified foot: Secondary | ICD-10-CM | POA: Diagnosis not present

## 2014-04-20 MED ORDER — TRIAMCINOLONE ACETONIDE 10 MG/ML IJ SUSP
10.0000 mg | Freq: Once | INTRAMUSCULAR | Status: AC
  Administered 2014-04-20: 10 mg

## 2014-04-20 NOTE — Progress Notes (Signed)
Subjective:     Patient ID: David Weaver, male   DOB: 15-Mar-1951, 63 y.o.   MRN: [id number]  Foot Pain   patient states that on having a lot of pain in my left foot on the inside and I'm not sure I didn't think. States it's been hurting for about a month and he is worn orthotics for long time   Review of Systems  All other systems reviewed and are negative.      Objective:   Physical Exam  Nursing note and vitals reviewed. Constitutional: He is oriented to person, place, and time.  Cardiovascular: Intact distal pulses.   Musculoskeletal: Normal range of motion.  Neurological: He is oriented to person, place, and time.  Skin: Skin is warm.   neurovascular status intact with muscle strength adequate and range of motion of the subtalar and midtarsal joint within normal limits. Patient is found to have inflammation around posterior tibial tendon at its insertion into the navicular and slightly distal left with no indication of muscle strength loss upon testing. Digits are well perfused and arch height is normal     Assessment:     Posterior tibial tendinitis left with mild edema noted    Plan:     H&P and x-ray reviewed and careful sheath injection of the left posterior tibial tendon was administered today 3 mg Kenalog 5 mg Xylocaine Marcaine mixture and I discussed future orthotics depending on how symptoms do. Dispensed fascially brace it to lift the arch and reappoint in 3 weeks

## 2014-04-20 NOTE — Progress Notes (Signed)
   Subjective:    Patient ID: David Weaver, male    DOB: 07-Jul-1951, 63 y.o.   MRN: [id number]  HPI  Pain in left foot, side/plantar region x 1 months. Pain worsens when walking  Review of Systems     Objective:   Physical Exam        Assessment & Plan:

## 2014-05-02 DIAGNOSIS — Z79899 Other long term (current) drug therapy: Secondary | ICD-10-CM | POA: Diagnosis not present

## 2014-05-02 DIAGNOSIS — R300 Dysuria: Secondary | ICD-10-CM | POA: Diagnosis not present

## 2014-05-04 DIAGNOSIS — M25569 Pain in unspecified knee: Secondary | ICD-10-CM | POA: Diagnosis not present

## 2014-05-08 DIAGNOSIS — R5383 Other fatigue: Secondary | ICD-10-CM | POA: Diagnosis not present

## 2014-05-08 DIAGNOSIS — IMO0001 Reserved for inherently not codable concepts without codable children: Secondary | ICD-10-CM | POA: Diagnosis not present

## 2014-05-08 DIAGNOSIS — Z09 Encounter for follow-up examination after completed treatment for conditions other than malignant neoplasm: Secondary | ICD-10-CM | POA: Diagnosis not present

## 2014-05-08 DIAGNOSIS — R5381 Other malaise: Secondary | ICD-10-CM | POA: Diagnosis not present

## 2014-05-08 DIAGNOSIS — G4700 Insomnia, unspecified: Secondary | ICD-10-CM | POA: Diagnosis not present

## 2014-05-11 ENCOUNTER — Encounter: Payer: Self-pay | Admitting: Podiatry

## 2014-05-11 ENCOUNTER — Ambulatory Visit (INDEPENDENT_AMBULATORY_CARE_PROVIDER_SITE_OTHER): Payer: Medicare Other | Admitting: Podiatry

## 2014-05-11 VITALS — BP 148/92 | HR 86 | Resp 16 | Ht 69.0 in | Wt 184.0 lb

## 2014-05-11 DIAGNOSIS — M7750 Other enthesopathy of unspecified foot: Secondary | ICD-10-CM | POA: Diagnosis not present

## 2014-05-11 MED ORDER — TRIAMCINOLONE ACETONIDE 10 MG/ML IJ SUSP
10.0000 mg | Freq: Once | INTRAMUSCULAR | Status: AC
  Administered 2014-05-11: 10 mg

## 2014-05-11 NOTE — Progress Notes (Signed)
Subjective:     Patient ID: David Weaver, male   DOB: [birth year], 63 y.o.   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 We'll take him off work for 5 days and reappoint in 3 weeks and may require a more stable orthotic

## 2014-05-12 ENCOUNTER — Telehealth: Payer: Self-pay

## 2014-05-12 NOTE — Telephone Encounter (Signed)
Refill oxyCODONE (OXY IR/ROXICODONE) 5 MG

## 2014-05-14 MED ORDER — OXYCODONE HCL 5 MG PO TABS: ORAL_TABLET | ORAL | Status: DC

## 2014-05-14 NOTE — Telephone Encounter (Signed)
rx for oxycodone printed per Dr. Thompson Caul instructions

## 2014-05-14 NOTE — Telephone Encounter (Signed)
OK to refill Oxycodone 5mg  one po bid #60 no refills.  Please have a PA print rx and sign because I am out of town for the next week.

## 2014-05-15 NOTE — Telephone Encounter (Signed)
Pt notified that rx is up front for p/u 

## 2014-05-22 DIAGNOSIS — G9009 Other idiopathic peripheral autonomic neuropathy: Secondary | ICD-10-CM | POA: Diagnosis not present

## 2014-05-22 DIAGNOSIS — G541 Lumbosacral plexus disorders: Secondary | ICD-10-CM | POA: Diagnosis not present

## 2014-05-22 DIAGNOSIS — IMO0001 Reserved for inherently not codable concepts without codable children: Secondary | ICD-10-CM | POA: Diagnosis not present

## 2014-05-22 DIAGNOSIS — M545 Low back pain, unspecified: Secondary | ICD-10-CM | POA: Diagnosis not present

## 2014-05-22 DIAGNOSIS — R209 Unspecified disturbances of skin sensation: Secondary | ICD-10-CM | POA: Diagnosis not present

## 2014-05-22 DIAGNOSIS — G894 Chronic pain syndrome: Secondary | ICD-10-CM | POA: Diagnosis not present

## 2014-05-22 DIAGNOSIS — M5412 Radiculopathy, cervical region: Secondary | ICD-10-CM | POA: Diagnosis not present

## 2014-05-22 DIAGNOSIS — Z79899 Other long term (current) drug therapy: Secondary | ICD-10-CM | POA: Diagnosis not present

## 2014-05-29 DIAGNOSIS — M545 Low back pain, unspecified: Secondary | ICD-10-CM | POA: Diagnosis not present

## 2014-06-01 ENCOUNTER — Ambulatory Visit (INDEPENDENT_AMBULATORY_CARE_PROVIDER_SITE_OTHER): Payer: Medicare Other | Admitting: Podiatry

## 2014-06-01 ENCOUNTER — Encounter: Payer: Self-pay | Admitting: Podiatry

## 2014-06-01 VITALS — BP 152/92 | HR 95 | Resp 17 | Ht 69.0 in | Wt 184.0 lb

## 2014-06-01 DIAGNOSIS — M7750 Other enthesopathy of unspecified foot: Secondary | ICD-10-CM

## 2014-06-01 DIAGNOSIS — S96912D Strain of unspecified muscle and tendon at ankle and foot level, left foot, subsequent encounter: Secondary | ICD-10-CM

## 2014-06-01 DIAGNOSIS — Z5189 Encounter for other specified aftercare: Secondary | ICD-10-CM | POA: Diagnosis not present

## 2014-06-01 DIAGNOSIS — R937 Abnormal findings on diagnostic imaging of other parts of musculoskeletal system: Secondary | ICD-10-CM | POA: Diagnosis not present

## 2014-06-03 NOTE — Progress Notes (Signed)
Subjective:     Patient ID: David Weaver, male   DOB: July 13, 1951, 63 y.o.   MRN: [id number]  HPI patient states the left foot is actually doing quite a bit better and he is able to return to his normal activity   Review of Systems     Objective:   Physical Exam Neurovascular status found to be intact with muscle strength adequate and I noted that the discomfort in the forefoot has improved quite nicely    Assessment:     Improved capsulitis with conservative means    Plan:     Advised on stairs bottom shoes and advised on anti-inflammatories and supportive insoles. Reappoint if symptoms persist or recur

## 2014-06-05 DIAGNOSIS — M5137 Other intervertebral disc degeneration, lumbosacral region: Secondary | ICD-10-CM | POA: Diagnosis not present

## 2014-06-05 DIAGNOSIS — M533 Sacrococcygeal disorders, not elsewhere classified: Secondary | ICD-10-CM | POA: Diagnosis not present

## 2014-06-05 DIAGNOSIS — M545 Low back pain, unspecified: Secondary | ICD-10-CM | POA: Diagnosis not present

## 2014-06-10 ENCOUNTER — Other Ambulatory Visit: Payer: Self-pay | Admitting: Family Medicine

## 2014-06-10 ENCOUNTER — Telehealth: Payer: Self-pay | Admitting: Family Medicine

## 2014-06-10 MED ORDER — OXYCODONE HCL 5 MG PO TABS: ORAL_TABLET | ORAL | Status: DC

## 2014-06-10 NOTE — Telephone Encounter (Signed)
Spoke with wife she states that husband is going to pain medicine and that he doesn't need his oxycodone Rx I spoke with dr [name] let her know that i threw RX away

## 2014-06-12 ENCOUNTER — Telehealth: Payer: Self-pay | Admitting: *Deleted

## 2014-06-12 DIAGNOSIS — M66872 Spontaneous rupture of other tendons, left ankle and foot: Secondary | ICD-10-CM

## 2014-06-12 NOTE — Telephone Encounter (Signed)
He was seen 06/01/2014.  He was told he needed an MRI.  We haven't heard anything about when he's going to have that done.  I can be reached at this number.  I returned her call and informed her that Dr. Paulla Dolly had not ordered a MRI but I will go ahead and put the order in if you like.  She stated that is what David Weaver said he told him at his last appointment.  I told her I'll put the order in, [facility] on the corner of [employer] [employer] should give you a call to get it scheduled. She stated okay thank you.

## 2014-06-13 ENCOUNTER — Other Ambulatory Visit: Payer: BC Managed Care – PPO

## 2014-06-17 ENCOUNTER — Ambulatory Visit
Admission: RE | Admit: 2014-06-17 | Discharge: 2014-06-17 | Disposition: A | Discharge status: Home / Self Care | Payer: Medicare Other | Source: Ambulatory Visit | Attending: Podiatry | Admitting: Podiatry

## 2014-06-17 DIAGNOSIS — M66872 Spontaneous rupture of other tendons, left ankle and foot: Secondary | ICD-10-CM

## 2014-06-17 DIAGNOSIS — M6580 Other synovitis and tenosynovitis, unspecified site: Secondary | ICD-10-CM | POA: Diagnosis not present

## 2014-06-22 ENCOUNTER — Ambulatory Visit (INDEPENDENT_AMBULATORY_CARE_PROVIDER_SITE_OTHER): Payer: Medicare Other | Admitting: Podiatry

## 2014-06-22 ENCOUNTER — Encounter: Payer: Self-pay | Admitting: Podiatry

## 2014-06-22 VITALS — BP 146/91 | HR 88 | Resp 18 | Ht 69.0 in | Wt 190.0 lb

## 2014-06-22 DIAGNOSIS — M7750 Other enthesopathy of unspecified foot: Secondary | ICD-10-CM

## 2014-06-23 DIAGNOSIS — M5137 Other intervertebral disc degeneration, lumbosacral region: Secondary | ICD-10-CM | POA: Diagnosis not present

## 2014-06-23 DIAGNOSIS — M48061 Spinal stenosis, lumbar region without neurogenic claudication: Secondary | ICD-10-CM | POA: Diagnosis not present

## 2014-06-23 NOTE — Progress Notes (Signed)
Subjective:     Patient ID: David Weaver, male   DOB: 1951-07-23, 64 y.o.   MRN: [id number]  HPI patient continues to experience discomfort in his left medial foot at the navicular with inflammation and fluid noted around the area and pain   Review of Systems     Objective:   Physical Exam  neurovascular status unchanged with continued discomfort at this left foot at the insertional point of the posterior tib into the navicular    Assessment:     Chronic tendinitis with MRI showing multiple signs of inflammation and tenosynovium right is with no acute tear noted    Plan:     Reviewed condition and it may be best to go ahead and debrided this area and tried to rehabilitate the tendon. He wants to get this done but at this time cannot do and at this time we'll hold off and we'll make a decision on this procedure

## 2014-07-03 DIAGNOSIS — G894 Chronic pain syndrome: Secondary | ICD-10-CM | POA: Diagnosis not present

## 2014-07-03 DIAGNOSIS — M5137 Other intervertebral disc degeneration, lumbosacral region: Secondary | ICD-10-CM | POA: Diagnosis not present

## 2014-07-03 DIAGNOSIS — M545 Low back pain, unspecified: Secondary | ICD-10-CM | POA: Diagnosis not present

## 2014-07-03 DIAGNOSIS — M533 Sacrococcygeal disorders, not elsewhere classified: Secondary | ICD-10-CM | POA: Diagnosis not present

## 2014-07-03 DIAGNOSIS — Z79899 Other long term (current) drug therapy: Secondary | ICD-10-CM | POA: Diagnosis not present

## 2014-07-04 DIAGNOSIS — R5381 Other malaise: Secondary | ICD-10-CM | POA: Diagnosis not present

## 2014-07-17 ENCOUNTER — Other Ambulatory Visit: Payer: Self-pay | Admitting: Family Medicine

## 2014-07-17 NOTE — Telephone Encounter (Signed)
Dr [name], you saw pt for CPE in May but I don't see a lab for vit D since 01/2013. Do you want to give RFs or does pt need to RTC?

## 2014-07-18 NOTE — Telephone Encounter (Signed)
I will obtain Vitamin D level at next OV.

## 2014-07-19 ENCOUNTER — Telehealth: Payer: Self-pay

## 2014-07-19 ENCOUNTER — Other Ambulatory Visit: Payer: Self-pay | Admitting: Family Medicine

## 2014-07-19 NOTE — Telephone Encounter (Signed)
FYI Dr. Smith

## 2014-07-19 NOTE — Telephone Encounter (Signed)
Patients wife wants Dr [name] to know the patient is switching from [facility] to [facility]. Patient has appointment scheduled for 09/14

## 2014-08-15 DIAGNOSIS — G894 Chronic pain syndrome: Secondary | ICD-10-CM | POA: Diagnosis not present

## 2014-08-15 DIAGNOSIS — M47817 Spondylosis without myelopathy or radiculopathy, lumbosacral region: Secondary | ICD-10-CM | POA: Diagnosis not present

## 2014-08-15 DIAGNOSIS — Z79899 Other long term (current) drug therapy: Secondary | ICD-10-CM | POA: Diagnosis not present

## 2014-08-15 DIAGNOSIS — M2550 Pain in unspecified joint: Secondary | ICD-10-CM | POA: Diagnosis not present

## 2014-09-12 DIAGNOSIS — Z79899 Other long term (current) drug therapy: Secondary | ICD-10-CM | POA: Diagnosis not present

## 2014-09-12 DIAGNOSIS — M25551 Pain in right hip: Secondary | ICD-10-CM | POA: Diagnosis not present

## 2014-09-12 DIAGNOSIS — M47816 Spondylosis without myelopathy or radiculopathy, lumbar region: Secondary | ICD-10-CM | POA: Diagnosis not present

## 2014-09-12 DIAGNOSIS — M25561 Pain in right knee: Secondary | ICD-10-CM | POA: Diagnosis not present

## 2014-09-12 DIAGNOSIS — R300 Dysuria: Secondary | ICD-10-CM | POA: Diagnosis not present

## 2014-09-12 DIAGNOSIS — M47812 Spondylosis without myelopathy or radiculopathy, cervical region: Secondary | ICD-10-CM | POA: Diagnosis not present

## 2014-09-28 DIAGNOSIS — M170 Bilateral primary osteoarthritis of knee: Secondary | ICD-10-CM | POA: Diagnosis not present

## 2014-09-28 DIAGNOSIS — L930 Discoid lupus erythematosus: Secondary | ICD-10-CM | POA: Diagnosis not present

## 2014-09-28 DIAGNOSIS — M19041 Primary osteoarthritis, right hand: Secondary | ICD-10-CM | POA: Diagnosis not present

## 2014-09-28 DIAGNOSIS — G4701 Insomnia due to medical condition: Secondary | ICD-10-CM | POA: Diagnosis not present

## 2014-10-04 ENCOUNTER — Ambulatory Visit: Payer: Medicare Other | Admitting: Family Medicine

## 2014-10-09 DIAGNOSIS — M47816 Spondylosis without myelopathy or radiculopathy, lumbar region: Secondary | ICD-10-CM | POA: Diagnosis not present

## 2014-10-09 DIAGNOSIS — M47812 Spondylosis without myelopathy or radiculopathy, cervical region: Secondary | ICD-10-CM | POA: Diagnosis not present

## 2014-10-09 DIAGNOSIS — M150 Primary generalized (osteo)arthritis: Secondary | ICD-10-CM | POA: Diagnosis not present

## 2014-10-09 DIAGNOSIS — Z79891 Long term (current) use of opiate analgesic: Secondary | ICD-10-CM | POA: Diagnosis not present

## 2014-10-11 ENCOUNTER — Encounter: Payer: Self-pay | Admitting: Family Medicine

## 2014-10-11 ENCOUNTER — Ambulatory Visit (INDEPENDENT_AMBULATORY_CARE_PROVIDER_SITE_OTHER): Payer: Medicare Other | Admitting: Family Medicine

## 2014-10-11 VITALS — BP 130/86 | HR 92 | Temp 97.9°F | Resp 16 | Ht 66.0 in | Wt 177.4 lb

## 2014-10-11 DIAGNOSIS — Z23 Encounter for immunization: Secondary | ICD-10-CM

## 2014-10-11 DIAGNOSIS — R799 Abnormal finding of blood chemistry, unspecified: Secondary | ICD-10-CM | POA: Diagnosis not present

## 2014-10-11 DIAGNOSIS — R7989 Other specified abnormal findings of blood chemistry: Secondary | ICD-10-CM

## 2014-10-11 DIAGNOSIS — R0781 Pleurodynia: Secondary | ICD-10-CM

## 2014-10-11 DIAGNOSIS — E7800 Pure hypercholesterolemia, unspecified: Secondary | ICD-10-CM

## 2014-10-11 DIAGNOSIS — S29019A Strain of muscle and tendon of unspecified wall of thorax, initial encounter: Secondary | ICD-10-CM

## 2014-10-11 DIAGNOSIS — I10 Essential (primary) hypertension: Secondary | ICD-10-CM | POA: Diagnosis not present

## 2014-10-11 DIAGNOSIS — S2341XA Sprain of ribs, initial encounter: Secondary | ICD-10-CM

## 2014-10-11 DIAGNOSIS — F1010 Alcohol abuse, uncomplicated: Secondary | ICD-10-CM

## 2014-10-11 DIAGNOSIS — R351 Nocturia: Secondary | ICD-10-CM

## 2014-10-11 DIAGNOSIS — R0789 Other chest pain: Secondary | ICD-10-CM

## 2014-10-11 LAB — POCT URINALYSIS DIPSTICK
Bilirubin, UA: NEGATIVE
Blood, UA: NEGATIVE
Glucose, UA: NEGATIVE
Ketones, UA: NEGATIVE
Leukocytes, UA: NEGATIVE
Nitrite, UA: NEGATIVE
PH UA: 7
PROTEIN UA: NEGATIVE
Spec Grav, UA: 1.02
Urobilinogen, UA: 0.2

## 2014-10-11 NOTE — Progress Notes (Signed)
Subjective:    Patient ID: David Weaver, male    DOB: January 01, 1951, 63 y.o.   MRN: [id number] Patient Active Problem List   Diagnosis Date Noted  . Annual physical exam 02/28/2013  . Left knee DJD   . Allergic rhinitis, cause unspecified   . Osteoarthrosis, unspecified whether generalized or localized, lower leg   . Alcohol abuse, daily use   . Esophageal reflux   . Lupus erythematosus   . Unspecified hearing loss   . Insomnia, unspecified   . Right knee DJD   . Unspecified vitamin D deficiency   . Essential hypertension, benign 11/01/2012  . Other abnormal glucose 11/01/2012  . Pre-operative clearance 11/01/2012  . Depression 11/01/2012  . Need for prophylactic vaccination and inoculation against influenza 11/01/2012  . Vitamin D deficiency 11/01/2012  . Insomnia 11/01/2012   Prior to Admission medications   Medication Sig Start Date End Date Taking? Authorizing Provider  amitriptyline (ELAVIL) 10 MG tablet Take 1-2 tablets (10-20 mg total) by mouth at bedtime as needed for sleep. 07/04/13  Yes Wardell Honour, MD  amLODipine (NORVASC) 10 MG tablet Take 1 tablet (10 mg total) by mouth daily. 04/03/14  Yes Wardell Honour, MD  aspirin 81 MG tablet Take 81 mg by mouth daily.   Yes Historical Provider, MD  diclofenac sodium (VOLTAREN) 1 % GEL Apply 2-4 g topically 4 (four) times daily. 12/29/13  Yes Wardell Honour, MD  DULoxetine (CYMBALTA) 60 MG capsule Take 1 capsule (60 mg total) by mouth 2 (two) times daily. 04/03/14  Yes Wardell Honour, MD  hydroxychloroquine (PLAQUENIL) 200 MG tablet Take 200 mg by mouth 2 (two) times daily.   Yes Historical Provider, MD  meloxicam (MOBIC) 15 MG tablet Take 1 tablet (15 mg total) by mouth daily. 12/29/13  Yes Wardell Honour, MD  omeprazole (PRILOSEC) 40 MG capsule Take 1 capsule (40 mg total) by mouth daily. 04/03/14  Yes Wardell Honour, MD  oxyCODONE (OXY IR/ROXICODONE) 5 MG immediate release tablet 1 tablet twice daily for pain 06/10/14  Yes Wardell Honour, MD  traZODone (DESYREL) 50 MG tablet Take 1-2 tablets (50-100 mg total) by mouth at bedtime as needed for sleep. 04/03/14  Yes Wardell Honour, MD  Vitamin D, Ergocalciferol, (DRISDOL) 50000 UNITS CAPS capsule TAKE ONE CAPSULE BY MOUTH EVERY 7 DAYS 07/18/14  Yes Wardell Honour, MD  methocarbamol (ROBAXIN) 500 MG tablet TAKE 1 TO 2 TABLETS BY MOUTH EVERY 8 HOURS AS NEEDED FOR MUSCLEPASMS 07/20/14   Wardell Honour, MD   No Known Allergies  HPI  This is a 63 year old male presenting for f/u.   He has lost 13 pounds since last visit. He reports he has done this by eating healthier and cutting back on drinking alcohol. He states he eats lots of progresso soup, vegetables and fish. He still eats a sausage biscuit in the mornings. He exercises by on a treadmill 2-3 times a week. He used to drink a pint of alcohol a few times a week. He is now only drinking 2-3 drinks a night 1-2 times a week. He feels that was the major reason he has lost weight.  He is checking his BP at home once a week. States it is usually 130-145/80s. He denies headaches, vision changes, palpitations. He is noting some bilateral lower rib pain lately after a long day of work. He works as a Curator.   He notes he is getting up  2-3 times at night to pee. No change in his stream.  He has started going to a pain clinic every month. Started going 3 months ago. He is pleased with his pain control.  He states he needs a breathing test for work.  Review of Systems  Constitutional: Negative for fever and chills.  HENT: Negative.   Eyes: Negative.   Respiratory: Negative.   Cardiovascular: Negative.   Gastrointestinal: Negative.   Endocrine: Positive for polyuria (nocturia).  Genitourinary: Negative.   Musculoskeletal: Positive for arthralgias (rib pain).  Skin: Negative.   Neurological: Negative.       Objective:   Physical Exam  Constitutional: He is oriented to person, place, and time. He appears well-developed and  well-nourished. No distress.  HENT:  Head: Normocephalic and atraumatic.  Right Ear: Hearing normal.  Left Ear: Hearing normal.  Nose: Nose normal.  Eyes: Conjunctivae and lids are normal. Right eye exhibits no discharge. Left eye exhibits no discharge. No scleral icterus.  Cardiovascular: Normal rate, regular rhythm, normal heart sounds and normal pulses.   No murmur heard. Pulmonary/Chest: Effort normal and breath sounds normal. No respiratory distress. He has no wheezes. He has no rhonchi. He has no rales. He exhibits no tenderness.  Musculoskeletal: Normal range of motion.  Neurological: He is alert and oriented to person, place, and time.  Skin: Skin is warm, dry and intact. No lesion and no rash noted.  Psychiatric: He has a normal mood and affect. His speech is normal and behavior is normal. Thought content normal.   BP 130/86 mmHg  Pulse 92  Temp(Src) 97.9 F (36.6 C) (Oral)  Resp 16  Ht 5\' 6"  (1.676 m)  Wt 177 lb 6.4 oz (80.468 kg)  BMI 28.65 kg/m2  SpO2 93% Results for orders placed or performed in visit on 10/11/14  POCT urinalysis dipstick  Result Value Ref Range   Color, UA yellow    Clarity, UA clear    Glucose, UA neg    Bilirubin, UA neg    Ketones, UA neg    Spec Grav, UA 1.020    Blood, UA neg    pH, UA 7.0    Protein, UA neg    Urobilinogen, UA 0.2    Nitrite, UA neg    Leukocytes, UA Negative       Assessment & Plan:  1. Flu vaccine need - Flu Vaccine QUAD 36+ mos IM  2. Abnormal TSH - TSH - COMPLETE METABOLIC PANEL WITH GFR  3. Essential hypertension, benign Home BP readings and reading in office today are better. He will continue on current regimen: amlodipine 10 mg. He was counseled on being more conscious about the added salt in his diet. - CBC with Differential - COMPLETE METABOLIC PANEL WITH GFR  4. Nocturia - POCT urinalysis dipstick  5. Alcohol abuse Patient decreased his drinking from 1 pint of liquor on most days to 2-3 drinks a  night 1-2 times a week. He was encouraged to keep up the good work with drinking less, exercising, eating healthy and losing weight.   Benjaman Pott Drenda Freeze, MHS [facility]  10/11/2014

## 2014-10-12 LAB — CBC WITH DIFFERENTIAL/PLATELET
BASOS PCT: 1 % (ref 0–1)
Basophils Absolute: 0.1 10*3/uL (ref 0.0–0.1)
EOS PCT: 5 % (ref 0–5)
Eosinophils Absolute: 0.3 10*3/uL (ref 0.0–0.7)
HEMATOCRIT: 44.9 % (ref 39.0–52.0)
HEMOGLOBIN: 15.4 g/dL (ref 13.0–17.0)
Lymphocytes Relative: 25 % (ref 12–46)
Lymphs Abs: 1.4 10*3/uL (ref 0.7–4.0)
MCH: 28.9 pg (ref 26.0–34.0)
MCHC: 34.3 g/dL (ref 30.0–36.0)
MCV: 84.4 fL (ref 78.0–100.0)
MONO ABS: 0.5 10*3/uL (ref 0.1–1.0)
MONOS PCT: 10 % (ref 3–12)
NEUTROS ABS: 3.2 10*3/uL (ref 1.7–7.7)
Neutrophils Relative %: 59 % (ref 43–77)
Platelets: 271 10*3/uL (ref 150–400)
RBC: 5.32 MIL/uL (ref 4.22–5.81)
RDW: 14.4 % (ref 11.5–15.5)
WBC: 5.4 10*3/uL (ref 4.0–10.5)

## 2014-10-12 LAB — TSH: TSH: 2.458 u[IU]/mL (ref 0.350–4.500)

## 2014-10-12 LAB — COMPLETE METABOLIC PANEL WITH GFR
ALBUMIN: 4.2 g/dL (ref 3.5–5.2)
ALK PHOS: 68 U/L (ref 39–117)
ALT: 28 U/L (ref 0–53)
AST: 20 U/L (ref 0–37)
BUN: 13 mg/dL (ref 6–23)
CALCIUM: 9.1 mg/dL (ref 8.4–10.5)
CHLORIDE: 104 meq/L (ref 96–112)
CO2: 23 mEq/L (ref 19–32)
CREATININE: 0.68 mg/dL (ref 0.50–1.35)
GFR, Est African American: >89 mL/min
GFR, Est Non African American: >89 mL/min
Glucose, Bld: 90 mg/dL (ref 70–99)
Potassium: 3.8 mEq/L (ref 3.5–5.3)
Sodium: 139 mEq/L (ref 135–145)
Total Bilirubin: 0.4 mg/dL (ref 0.2–1.2)
Total Protein: 6.6 g/dL (ref 6.0–8.3)

## 2014-10-12 NOTE — Progress Notes (Signed)
LMVM for pt to CB to schedule appt.

## 2014-10-15 NOTE — Progress Notes (Signed)
History and physical examinations obtained with Bennett Scrape, PA-C.  Additionally:  S: rib pain present B with changes in position like when getting up out of bed.  No pain with ambulation, deep breathing, working, eating.  No associated SOB, diaphoresis, nausea with rib pain B.  O: anterior chest wall NT to palpation; +pain in anterior B ribs reproduced with lateral bending and rotation side to side of lumbar spine.  Abdomen: soft, NT, ND; normal BS.  A/P: monitor rib pain over the next few months; RTC for acute worsening or persistent pain.  Consistent with musculoskeletal etiology such as strain.  Avoid heavy lifting. Administer Prevnar-13 at next visit.

## 2014-10-31 ENCOUNTER — Encounter: Payer: Self-pay | Admitting: Family Medicine

## 2014-11-02 DIAGNOSIS — L932 Other local lupus erythematosus: Secondary | ICD-10-CM | POA: Diagnosis not present

## 2014-11-06 NOTE — Progress Notes (Signed)
LMVM for pt to CB to schedule a follow up appt with Dr. [name] in March.

## 2014-11-08 DIAGNOSIS — M47816 Spondylosis without myelopathy or radiculopathy, lumbar region: Secondary | ICD-10-CM | POA: Diagnosis not present

## 2014-11-08 DIAGNOSIS — M47812 Spondylosis without myelopathy or radiculopathy, cervical region: Secondary | ICD-10-CM | POA: Diagnosis not present

## 2014-11-08 DIAGNOSIS — Z79891 Long term (current) use of opiate analgesic: Secondary | ICD-10-CM | POA: Diagnosis not present

## 2014-11-08 DIAGNOSIS — M150 Primary generalized (osteo)arthritis: Secondary | ICD-10-CM | POA: Diagnosis not present

## 2014-11-21 ENCOUNTER — Telehealth: Payer: Self-pay

## 2014-11-21 NOTE — Telephone Encounter (Signed)
Advised pt to RTC she states they do not have time right now.  Pt is going to try a OTC medicated cream. Wife states this looks like a yeast infection to her. Advised to RTC if symptoms do not improve.

## 2014-11-21 NOTE — Telephone Encounter (Signed)
David Weaver states her husband is getting a rash in his groin area and before was given VOLTAREN 1% GEL, would like to know if he can get a refill on his medicine Please call [phone]     [facility]

## 2014-12-26 ENCOUNTER — Other Ambulatory Visit: Payer: Self-pay | Admitting: Family Medicine

## 2014-12-28 NOTE — Telephone Encounter (Signed)
Dr [name], you just saw pt in Nov, but had prev Rxd this for LBP which I don't see discussed at [facility] [place]. Do you want to give RFs?

## 2015-01-03 DIAGNOSIS — M150 Primary generalized (osteo)arthritis: Secondary | ICD-10-CM | POA: Diagnosis not present

## 2015-01-03 DIAGNOSIS — M47816 Spondylosis without myelopathy or radiculopathy, lumbar region: Secondary | ICD-10-CM | POA: Diagnosis not present

## 2015-01-03 DIAGNOSIS — M47812 Spondylosis without myelopathy or radiculopathy, cervical region: Secondary | ICD-10-CM | POA: Diagnosis not present

## 2015-01-03 DIAGNOSIS — Z79891 Long term (current) use of opiate analgesic: Secondary | ICD-10-CM | POA: Diagnosis not present

## 2015-01-30 ENCOUNTER — Other Ambulatory Visit: Payer: Self-pay | Admitting: Family Medicine

## 2015-02-12 ENCOUNTER — Ambulatory Visit (INDEPENDENT_AMBULATORY_CARE_PROVIDER_SITE_OTHER): Payer: Medicare Other | Admitting: Family Medicine

## 2015-02-12 ENCOUNTER — Encounter: Payer: Self-pay | Admitting: Family Medicine

## 2015-02-12 VITALS — BP 145/80 | HR 89 | Temp 97.9°F | Resp 16 | Ht 66.5 in | Wt 176.0 lb

## 2015-02-12 DIAGNOSIS — G894 Chronic pain syndrome: Secondary | ICD-10-CM

## 2015-02-12 DIAGNOSIS — F329 Major depressive disorder, single episode, unspecified: Secondary | ICD-10-CM

## 2015-02-12 DIAGNOSIS — K219 Gastro-esophageal reflux disease without esophagitis: Secondary | ICD-10-CM

## 2015-02-12 DIAGNOSIS — G4700 Insomnia, unspecified: Secondary | ICD-10-CM | POA: Diagnosis not present

## 2015-02-12 DIAGNOSIS — I10 Essential (primary) hypertension: Secondary | ICD-10-CM | POA: Diagnosis not present

## 2015-02-12 DIAGNOSIS — Z23 Encounter for immunization: Secondary | ICD-10-CM

## 2015-02-12 DIAGNOSIS — F32A Depression, unspecified: Secondary | ICD-10-CM

## 2015-02-12 NOTE — Patient Instructions (Signed)

## 2015-02-12 NOTE — Progress Notes (Signed)
Subjective:    Patient ID: David Weaver, male    DOB: Aug 03, 1951, 64 y.o.   MRN: [id number]  02/12/2015  Hypertension   HPI This 64 y.o. male presents for four month follow-up:  1. HTN:  Patient reports good compliance with medication, good tolerance to medication, and good symptom control.  Does not check BP at home.  2.  Anxiety and depression:  Patient reports good compliance with medication, good tolerance to medication, and good symptom control.  Mood is stable at this time; no major stressors.   3.  Insomnia:  Taking Trazodone qhs; takes 2 to help.    4.  Chronic pain syndrome:  Taking Oxycodone 3 daily.  Followed by pain clinic.    5.  GERD: Patient reports good compliance with medication, good tolerance to medication, and good symptom control.      Review of Systems  Constitutional: Negative for fever, chills, diaphoresis, activity change, appetite change and fatigue.  Respiratory: Negative for cough and shortness of breath.   Cardiovascular: Negative for chest pain, palpitations and leg swelling.  Gastrointestinal: Negative for nausea, vomiting, abdominal pain and diarrhea.  Endocrine: Negative for cold intolerance, heat intolerance, polydipsia, polyphagia and polyuria.  Musculoskeletal: Positive for back pain and arthralgias. Negative for joint swelling.  Skin: Negative for color change, rash and wound.  Neurological: Negative for dizziness, tremors, seizures, syncope, facial asymmetry, speech difficulty, weakness, light-headedness, numbness and headaches.  Psychiatric/Behavioral: Positive for sleep disturbance. Negative for suicidal ideas, self-injury and dysphoric mood. The patient is not nervous/anxious.     Past Medical History  Diagnosis Date  . Other abnormal glucose   . Allergic rhinitis, cause unspecified   . Osteoarthrosis, unspecified whether generalized or localized, lower leg   . Other specified disorder of male genital organs(608.89)   . Other malaise  and fatigue   . Alcohol abuse, daily use   . Essential hypertension, benign   . Esophageal reflux   . Lupus erythematosus   . Unspecified hearing loss   . Insomnia, unspecified   . Unspecified vitamin D deficiency   . Left knee DJD   . Shortness of breath   . Fibromyalgia     followed by [name] every six months.  . Tobacco use disorder     quit smokeing 2012  . Dysthymic disorder   . Impotence of organic origin    Past Surgical History  Procedure Laterality Date  . Cholecystectomy    . Carpal tunnel release      bilateral  . Ganglion cyst excision  1980    Left wrist  . Knee arthroscopy  2008    Right  . Lumbar spine epidural  2009    [facility]  . Rotator cuff repair  10/2008    right  . Total knee arthroplasty  12/27/2012    Procedure: TOTAL KNEE ARTHROPLASTY;  Surgeon: Nilda Simmer, MD;  Location: [facility] OR;  Service: Orthopedics;  Laterality: Right;  RIGHT ARTHROPLASTY KNEE MEDIAL AND LATERAL COMPARTMENTS WITH PATELLA RESURFACING, RIGHT TOTAL KNEE REPLACEMENT  . Total knee arthroplasty Left 01/31/2013    Procedure: TOTAL KNEE ARTHROPLASTY;  Surgeon: Nilda Simmer, MD;  Location: [facility] OR;  Service: Orthopedics;  Laterality: Left;  . Joint replacement  01/2013    right knee, left knee. [facility].  . Colonoscopy  03/01/2014    two polyps; Dr. Elnoria Howard.  Repeat 5 years.   No Known Allergies Current Outpatient Prescriptions  Medication Sig Dispense Refill  . amitriptyline (ELAVIL)  10 MG tablet Take 1-2 tablets (10-20 mg total) by mouth at bedtime as needed for sleep. (Patient not taking: Reported on 02/12/2015) 60 tablet 5  . amLODipine (NORVASC) 10 MG tablet Take 1 tablet (10 mg total) by mouth daily. 30 tablet 11  . aspirin 81 MG tablet Take 81 mg by mouth daily.    . diclofenac sodium (VOLTAREN) 1 % GEL Apply 2-4 g topically 4 (four) times daily. 100 g 11  . DULoxetine (CYMBALTA) 60 MG capsule Take 1 capsule (60 mg total) by mouth 2 (two) times daily. 60 capsule 11  .  hydroxychloroquine (PLAQUENIL) 200 MG tablet Take 200 mg by mouth 2 (two) times daily.    . meloxicam (MOBIC) 15 MG tablet TAKE 1 TABLET BY MOUTH EVERY DAY 30 tablet 2  . methocarbamol (ROBAXIN) 500 MG tablet TAKE 1 TO 2 TABLETS BY MOUTH EVERY 8 HOURS AS NEEDED FOR MUSCLEPASMS (Patient not taking: Reported on 02/12/2015) 60 tablet 0  . omeprazole (PRILOSEC) 40 MG capsule Take 1 capsule (40 mg total) by mouth daily. 30 capsule 11  . oxyCODONE (OXY IR/ROXICODONE) 5 MG immediate release tablet 1 tablet twice daily for pain (Patient taking differently: 1 tablet three times daily for pain) 60 tablet 0  . oxyCODONE-acetaminophen (PERCOCET/ROXICET) 5-325 MG per tablet   0  . traZODone (DESYREL) 50 MG tablet Take 1-2 tablets (50-100 mg total) by mouth at bedtime as needed for sleep. 60 tablet 11  . Vitamin D, Ergocalciferol, (DRISDOL) 50000 UNITS CAPS capsule Take 1 capsule (50,000 Units total) by mouth every 7 (seven) days. DUE FOR VIT D LAB AT FOLLOW UP. 4 capsule 0   No current facility-administered medications for this visit.       Objective:    BP 145/80 mmHg  Pulse 89  Temp(Src) 97.9 F (36.6 C) (Oral)  Resp 16  Ht 5' 6.5" (1.689 m)  Wt 176 lb (79.833 kg)  BMI 27.98 kg/m2  SpO2 97% Physical Exam  Constitutional: He is oriented to person, place, and time. He appears well-developed and well-nourished. No distress.  HENT:  Head: Normocephalic and atraumatic.  Right Ear: External ear normal.  Left Ear: External ear normal.  Nose: Nose normal.  Mouth/Throat: Oropharynx is clear and moist.  Eyes: Conjunctivae and EOM are normal. Pupils are equal, round, and reactive to light.  Neck: Normal range of motion. Neck supple. Carotid bruit is not present. No thyromegaly present.  Cardiovascular: Normal rate, regular rhythm, normal heart sounds and intact distal pulses.  Exam reveals no gallop and no friction rub.   No murmur heard. Pulmonary/Chest: Effort normal and breath sounds normal. He has  no wheezes. He has no rales.  Abdominal: Soft. Bowel sounds are normal. He exhibits no distension and no mass. There is no tenderness. There is no rebound and no guarding.  Lymphadenopathy:    He has no cervical adenopathy.  Neurological: He is alert and oriented to person, place, and time. No cranial nerve deficit.  Skin: Skin is warm and dry. No rash noted. He is not diaphoretic.  Psychiatric: He has a normal mood and affect. His behavior is normal.  Nursing note and vitals reviewed.  Results for orders placed or performed in visit on 10/11/14  TSH  Result Value Ref Range   TSH 2.458 0.350 - 4.500 uIU/mL  CBC with Differential  Result Value Ref Range   WBC 5.4 4.0 - 10.5 K/uL   RBC 5.32 4.22 - 5.81 MIL/uL   Hemoglobin 15.4 13.0 -  17.0 g/dL   HCT 44.9 39.0 - 52.0 %   MCV 84.4 78.0 - 100.0 fL   MCH 28.9 26.0 - 34.0 pg   MCHC 34.3 30.0 - 36.0 g/dL   RDW 14.4 11.5 - 15.5 %   Platelets 271 150 - 400 K/uL   Neutrophils Relative % 59 43 - 77 %   Neutro Abs 3.2 1.7 - 7.7 K/uL   Lymphocytes Relative 25 12 - 46 %   Lymphs Abs 1.4 0.7 - 4.0 K/uL   Monocytes Relative 10 3 - 12 %   Monocytes Absolute 0.5 0.1 - 1.0 K/uL   Eosinophils Relative 5 0 - 5 %   Eosinophils Absolute 0.3 0.0 - 0.7 K/uL   Basophils Relative 1 0 - 1 %   Basophils Absolute 0.1 0.0 - 0.1 K/uL   Smear Review Criteria for review not met   COMPLETE METABOLIC PANEL WITH GFR  Result Value Ref Range   Sodium 139 135 - 145 mEq/L   Potassium 3.8 3.5 - 5.3 mEq/L   Chloride 104 96 - 112 mEq/L   CO2 23 19 - 32 mEq/L   Glucose, Bld 90 70 - 99 mg/dL   BUN 13 6 - 23 mg/dL   Creat 0.68 0.50 - 1.35 mg/dL   Total Bilirubin 0.4 0.2 - 1.2 mg/dL   Alkaline Phosphatase 68 39 - 117 U/L   AST 20 0 - 37 U/L   ALT 28 0 - 53 U/L   Total Protein 6.6 6.0 - 8.3 g/dL   Albumin 4.2 3.5 - 5.2 g/dL   Calcium 9.1 8.4 - 10.5 mg/dL   GFR, Est African American >89 mL/min   GFR, Est Non African American >89 mL/min  POCT urinalysis dipstick    Result Value Ref Range   Color, UA yellow    Clarity, UA clear    Glucose, UA neg    Bilirubin, UA neg    Ketones, UA neg    Spec Grav, UA 1.020    Blood, UA neg    pH, UA 7.0    Protein, UA neg    Urobilinogen, UA 0.2    Nitrite, UA neg    Leukocytes, UA Negative    PREVNAR-13 ADMINISTERED.    Assessment & Plan:   1. Depression   2. Gastroesophageal reflux disease without esophagitis   3. Essential hypertension, benign   4. Insomnia   5. Chronic pain syndrome   6. Need for pneumococcal vaccine     1. Depression: controlled; no changes to therapy. 2. GERD; controlled; continue current medications. 3.  HTN: moderately controlled; pt reluctant to add second agent; if remains borderline at next visit, will add another agent.  Recommend weight loss, exercise, low sodium food choices. 4.  Insomnia; controlled with Trazodone $RemoveBefore'100mg'[id number]$  qhs. 5.  Chronic pain syndrome: stable; followed by pain management. 6. S/p Prevnar 13.  No orders of the defined types were placed in this encounter.    Return in about 4 months (around 06/14/2015) for complete physical examiniation.    Syniyah Bourne Elayne Guerin, M.D. [facility] [name] [place], [place]  [address] [phone] phone [phone] fax

## 2015-02-17 ENCOUNTER — Encounter (HOSPITAL_COMMUNITY): Payer: Self-pay | Admitting: *Deleted

## 2015-02-17 ENCOUNTER — Emergency Department (HOSPITAL_COMMUNITY): Payer: Worker's Compensation

## 2015-02-17 ENCOUNTER — Emergency Department (HOSPITAL_COMMUNITY)
Admission: EM | Admit: 2015-02-17 | Discharge: 2015-02-17 | Disposition: A | Discharge status: Home / Self Care | Payer: Worker's Compensation | Attending: Emergency Medicine | Admitting: Emergency Medicine

## 2015-02-17 DIAGNOSIS — S52591A Other fractures of lower end of right radius, initial encounter for closed fracture: Secondary | ICD-10-CM | POA: Diagnosis not present

## 2015-02-17 DIAGNOSIS — Z79899 Other long term (current) drug therapy: Secondary | ICD-10-CM | POA: Insufficient documentation

## 2015-02-17 DIAGNOSIS — R300 Dysuria: Secondary | ICD-10-CM | POA: Diagnosis not present

## 2015-02-17 DIAGNOSIS — H9190 Unspecified hearing loss, unspecified ear: Secondary | ICD-10-CM | POA: Insufficient documentation

## 2015-02-17 DIAGNOSIS — Z872 Personal history of diseases of the skin and subcutaneous tissue: Secondary | ICD-10-CM | POA: Diagnosis not present

## 2015-02-17 DIAGNOSIS — Z791 Long term (current) use of non-steroidal anti-inflammatories (NSAID): Secondary | ICD-10-CM | POA: Diagnosis not present

## 2015-02-17 DIAGNOSIS — Y929 Unspecified place or not applicable: Secondary | ICD-10-CM | POA: Diagnosis not present

## 2015-02-17 DIAGNOSIS — Y998 Other external cause status: Secondary | ICD-10-CM | POA: Diagnosis not present

## 2015-02-17 DIAGNOSIS — I10 Essential (primary) hypertension: Secondary | ICD-10-CM | POA: Diagnosis not present

## 2015-02-17 DIAGNOSIS — Z87891 Personal history of nicotine dependence: Secondary | ICD-10-CM | POA: Diagnosis not present

## 2015-02-17 DIAGNOSIS — S52501A Unspecified fracture of the lower end of right radius, initial encounter for closed fracture: Secondary | ICD-10-CM | POA: Diagnosis not present

## 2015-02-17 DIAGNOSIS — Y939 Activity, unspecified: Secondary | ICD-10-CM | POA: Insufficient documentation

## 2015-02-17 DIAGNOSIS — Z7982 Long term (current) use of aspirin: Secondary | ICD-10-CM | POA: Insufficient documentation

## 2015-02-17 DIAGNOSIS — K219 Gastro-esophageal reflux disease without esophagitis: Secondary | ICD-10-CM | POA: Diagnosis not present

## 2015-02-17 DIAGNOSIS — M7989 Other specified soft tissue disorders: Secondary | ICD-10-CM | POA: Diagnosis not present

## 2015-02-17 DIAGNOSIS — S6991XA Unspecified injury of right wrist, hand and finger(s), initial encounter: Secondary | ICD-10-CM | POA: Diagnosis present

## 2015-02-17 DIAGNOSIS — Z87438 Personal history of other diseases of male genital organs: Secondary | ICD-10-CM | POA: Diagnosis not present

## 2015-02-17 DIAGNOSIS — E559 Vitamin D deficiency, unspecified: Secondary | ICD-10-CM | POA: Diagnosis not present

## 2015-02-17 DIAGNOSIS — W010XXA Fall on same level from slipping, tripping and stumbling without subsequent striking against object, initial encounter: Secondary | ICD-10-CM | POA: Insufficient documentation

## 2015-02-17 DIAGNOSIS — M2550 Pain in unspecified joint: Secondary | ICD-10-CM | POA: Diagnosis not present

## 2015-02-17 DIAGNOSIS — M1990 Unspecified osteoarthritis, unspecified site: Secondary | ICD-10-CM | POA: Insufficient documentation

## 2015-02-17 NOTE — ED Notes (Signed)
Ortho tech paged  

## 2015-02-17 NOTE — ED Notes (Signed)
The pt is c/o rt  Wrist and rt hand.  He fell yesterdfay pain with movement

## 2015-02-17 NOTE — ED Provider Notes (Signed)
CSN: [id number]     Arrival date & time 02/17/15  1807 History  This chart was scribed for David Mons, PA-C working with David Rank, MD by Mercy Moore, ED Scribe. This patient was seen in room TR07C/TR07C and the patient's care was started at 7:32 PM.   Chief Complaint  Patient presents with  . Hand Injury   The history is provided by the patient. No language interpreter was used.   HPI Comments: David Weaver is a 64 y.o. male who presents to the Emergency Department complaining of right wrist and hand injury incurred yesterday after a mechanical fall. Patient reports landing on flexed right wrist. Patient reports immediate pain and swelling. Patient has Oxycodone at home for his chronic pain; he denies relief of pain.   Past Medical History  Diagnosis Date  . Other abnormal glucose   . Allergic rhinitis, cause unspecified   . Osteoarthrosis, unspecified whether generalized or localized, lower leg   . Other specified disorder of male genital organs(608.89)   . Other malaise and fatigue   . Alcohol abuse, daily use   . Essential hypertension, benign   . Esophageal reflux   . Lupus erythematosus   . Unspecified hearing loss   . Insomnia, unspecified   . Unspecified vitamin D deficiency   . Left knee DJD   . Shortness of breath   . Fibromyalgia     followed by [name] every six months.  . Tobacco use disorder     quit smokeing 2012  . Dysthymic disorder   . Impotence of organic origin    Past Surgical History  Procedure Laterality Date  . Cholecystectomy    . Carpal tunnel release      bilateral  . Ganglion cyst excision  1980    Left wrist  . Knee arthroscopy  2008    Right  . Lumbar spine epidural  2009    [facility]  . Rotator cuff repair  10/2008    right  . Total knee arthroplasty  12/27/2012    Procedure: TOTAL KNEE ARTHROPLASTY;  Surgeon: [name], MD;  Location: [facility];  Service: Orthopedics;  Laterality: Right;  RIGHT ARTHROPLASTY KNEE MEDIAL  AND LATERAL COMPARTMENTS WITH PATELLA RESURFACING, RIGHT TOTAL KNEE REPLACEMENT  . Total knee arthroplasty Left 01/31/2013    Procedure: TOTAL KNEE ARTHROPLASTY;  Surgeon: [name], MD;  Location: [facility];  Service: Orthopedics;  Laterality: Left;  . Joint replacement  01/2013    right knee, left knee. [facility].  . Colonoscopy  03/01/2014    two polyps; Dr. Benson Norway.  Repeat 5 years.   Family History  Problem Relation Age of Onset  . Alzheimer's disease Mother   . Osteoporosis Mother   . Heart disease Father 21    AMI  . Alcohol abuse Father   . Cancer Brother     colon cancer  . Diabetes Brother   . Kidney failure Brother    History  Substance Use Topics  . Smoking status: Former Smoker -- 2.00 packs/day for 30 years    Types: Cigarettes    Quit date: 12/28/2008  . Smokeless tobacco: Never Used     Comment: quit 12 /2011  . Alcohol Use: 5.6 oz/week    6 Cans of beer, 4 Standard drinks or equivalent per week     Comment: 2 drinks of liquor, every afternoon    12/28/2012  wekends 4 5 beers    Review of Systems  Constitutional: Negative  for fever and chills.  Musculoskeletal:       Hand and wrist pain and swelling   Allergies  Review of patient's allergies indicates no known allergies.  Home Medications   Prior to Admission medications   Medication Sig Start Date End Date Taking? Authorizing Provider  amitriptyline (ELAVIL) 10 MG tablet Take 1-2 tablets (10-20 mg total) by mouth at bedtime as needed for sleep. Patient not taking: Reported on 02/12/2015 07/04/13   Wardell Honour, MD  amLODipine (NORVASC) 10 MG tablet Take 1 tablet (10 mg total) by mouth daily. 04/03/14   Wardell Honour, MD  aspirin 81 MG tablet Take 81 mg by mouth daily.    Historical Provider, MD  diclofenac sodium (VOLTAREN) 1 % GEL Apply 2-4 g topically 4 (four) times daily. 12/29/13   Wardell Honour, MD  DULoxetine (CYMBALTA) 60 MG capsule Take 1 capsule (60 mg total) by mouth 2 (two) times daily. 04/03/14    Wardell Honour, MD  hydroxychloroquine (PLAQUENIL) 200 MG tablet Take 200 mg by mouth 2 (two) times daily.    Historical Provider, MD  meloxicam (MOBIC) 15 MG tablet TAKE 1 TABLET BY MOUTH EVERY DAY 12/28/14   Wardell Honour, MD  methocarbamol (ROBAXIN) 500 MG tablet TAKE 1 TO 2 TABLETS BY MOUTH EVERY 8 HOURS AS NEEDED FOR MUSCLEPASMS Patient not taking: Reported on 02/12/2015 07/20/14   Wardell Honour, MD  omeprazole (PRILOSEC) 40 MG capsule Take 1 capsule (40 mg total) by mouth daily. 04/03/14   Wardell Honour, MD  oxyCODONE (OXY IR/ROXICODONE) 5 MG immediate release tablet 1 tablet twice daily for pain Patient taking differently: 1 tablet three times daily for pain 06/10/14   Wardell Honour, MD  oxyCODONE-acetaminophen (PERCOCET/ROXICET) 5-325 MG per tablet  09/13/14   Historical Provider, MD  traZODone (DESYREL) 50 MG tablet Take 1-2 tablets (50-100 mg total) by mouth at bedtime as needed for sleep. 04/03/14   Wardell Honour, MD  Vitamin D, Ergocalciferol, (DRISDOL) 50000 UNITS CAPS capsule Take 1 capsule (50,000 Units total) by mouth every 7 (seven) days. DUE FOR VIT D LAB AT FOLLOW UP. 01/31/15   Wardell Honour, MD   Triage Vitals: BP 119/64 mmHg  Pulse 88  Temp(Src) 98.1 F (36.7 C)  Ht 5\' 9"  (1.753 m)  Wt 176 lb (79.833 kg)  BMI 25.98 kg/m2  SpO2 96% Physical Exam  Constitutional: He is oriented to person, place, and time. He appears well-developed and well-nourished. No distress.  HENT:  Head: Normocephalic and atraumatic.  Eyes: Conjunctivae and EOM are normal.  Neck: Normal range of motion. Neck supple.  Cardiovascular: Normal rate, regular rhythm and normal heart sounds.   Pulmonary/Chest: Effort normal and breath sounds normal.  Musculoskeletal:  R wrist TTP over distal radius with swelling. FROM hand. Wrist ROM limited by pain. +2 radial pulse. Cap refill < 3 seconds. Elbow normal.  Neurological: He is alert and oriented to person, place, and time.  Skin: Skin is warm and dry.   Psychiatric: He has a normal mood and affect. His behavior is normal.  Nursing note and vitals reviewed.   ED Course  Procedures (including critical care time)  SPLINT APPLICATION Date/Time: 4:09 PM Authorized by: David Weaver Consent: Verbal consent obtained. Risks and benefits: risks, benefits and alternatives were discussed Consent given by: patient Splint applied by: orthopedic technician Location details: right wrist Splint type: volar Supplies used: orthoglass Post-procedure: The splinted body part was neurovascularly unchanged following the procedure.  Patient tolerance: Patient tolerated the procedure well with no immediate complications.    COORDINATION OF CARE: 7:35 PM- Awaiting radiologist reading. Discussed treatment plan with patient at bedside and patient agreed to plan.   Labs Review Labs Reviewed - No data to display  Imaging Review Dg Wrist Complete Right  02/17/2015   CLINICAL DATA:  Acute fall yesterday right wrist injury, pain and swelling. Initial encounter.  EXAM: RIGHT WRIST - COMPLETE 3+ VIEW  COMPARISON:  None.  FINDINGS: A nondisplaced fracture of the dorsal distal radius is noted.  No other fracture, subluxation or dislocation identified.  Soft tissue swelling is noted.  Degenerative changes along the lateral wrist noted.  IMPRESSION: Nondisplaced fracture of the dorsal distal radius with soft tissue swelling.   Electronically Signed   By: Margarette Canada M.D.   On: 02/17/2015 19:57   Dg Hand Complete Right  02/17/2015   CLINICAL DATA:  Fall onto concrete 1 day prior.  EXAM: RIGHT HAND - COMPLETE 3+ VIEW  COMPARISON:  None.  FINDINGS: No evidence of fracture of the carpal or metacarpal bones. Radiocarpal joint is intact. Phalanges are normal. No soft tissue injury.  There is joint space narrowing and sclerosis at the lunate trapezium joint.  IMPRESSION: No evidence of acute fracture of the hand.   Electronically Signed   By: Suzy Bouchard M.D.   On:  02/17/2015 19:58     EKG Interpretation None      MDM   Final diagnoses:  Distal radius fracture, right, closed, initial encounter  Fall from slip, trip, or stumble, initial encounter   Patient with wrist injury after mechanical fall. Neurovascularly intact. X-ray results as shown above. Splint applied. He has seen Dr. Noemi Chapel for orthopedics in the past. Advised him to call their office Monday to schedule an appointment to be seen in follow-up. Stable for discharge. Return precautions given. Patient states understanding of treatment care plan and is agreeable.  Discussed with attending Dr. Tomi Bamberger who also evaluated patient and agrees with plan of care.  I personally performed the services described in this documentation, which was scribed in my presence. The recorded information has been reviewed and is accurate.   Carman Ching, PA-C 02/17/15 2035  David Rank, MD 02/17/15 2043

## 2015-02-17 NOTE — Progress Notes (Signed)
Orthopedic Tech Progress Note Patient Details:  LAYTON NAVES 01/07/1951 [id number]  Ortho Devices Type of Ortho Device: Ace wrap, Volar splint Ortho Device/Splint Location: RUE Ortho Device/Splint Interventions: Ordered, Application   Braulio Bosch 02/17/2015, 8:37 PM

## 2015-02-17 NOTE — Discharge Instructions (Signed)
Radial Fracture You have a broken bone (fracture) of the forearm. This is the part of your arm between the elbow and your wrist. Your forearm is made up of two bones. These are the radius and ulna. Your fracture is in the radial shaft. This is the bone in your forearm located on the thumb side. A cast or splint is used to protect and keep your injured bone from moving. The cast or splint will be on generally for about 5 to 6 weeks, with individual variations. HOME CARE INSTRUCTIONS   Keep the injured part elevated while sitting or lying down. Keep the injury above the level of your heart (the center of the chest). This will decrease swelling and pain.  Apply ice to the injury for 15-20 minutes, 03-04 times per day while awake, for 2 days. Put the ice in a plastic bag and place a towel between the bag of ice and your cast or splint.  Move your fingers to avoid stiffness and minimize swelling.  If you have a plaster or fiberglass cast:  Do not try to scratch the skin under the cast using sharp or pointed objects.  Check the skin around the cast every day. You may put lotion on any red or sore areas.  Keep your cast dry and clean.  If you have a plaster splint:  Wear the splint as directed.  You may loosen the elastic around the splint if your fingers become numb, tingle, or turn cold or blue.  Do not put pressure on any part of your cast or splint. It may break. Rest your cast only on a pillow for the first 24 hours until it is fully hardened.  Your cast or splint can be protected during bathing with a plastic bag. Do not lower the cast or splint into water.  Only take over-the-counter or prescription medicines for pain, discomfort, or fever as directed by your caregiver. SEEK IMMEDIATE MEDICAL CARE IF:   Your cast gets damaged or breaks.  You have more severe pain or swelling than you did before getting the cast.  You have severe pain when stretching your fingers.  There is a bad  smell, new stains and/or pus-like (purulent) drainage coming from under the cast.  Your fingers or hand turn pale or blue and become cold or your loose feeling. Document Released: 04/30/2006 Document Revised: 02/09/2012 Document Reviewed: 07/27/2006 [facility] Patient Information 2015 [place], [place]. This information is not intended to replace advice given to you by your health care provider. Make sure you discuss any questions you have with your health care provider.

## 2015-03-06 DIAGNOSIS — Z79891 Long term (current) use of opiate analgesic: Secondary | ICD-10-CM | POA: Diagnosis not present

## 2015-03-06 DIAGNOSIS — M47812 Spondylosis without myelopathy or radiculopathy, cervical region: Secondary | ICD-10-CM | POA: Diagnosis not present

## 2015-03-06 DIAGNOSIS — M47816 Spondylosis without myelopathy or radiculopathy, lumbar region: Secondary | ICD-10-CM | POA: Diagnosis not present

## 2015-03-06 DIAGNOSIS — M150 Primary generalized (osteo)arthritis: Secondary | ICD-10-CM | POA: Diagnosis not present

## 2015-03-11 ENCOUNTER — Other Ambulatory Visit: Payer: Self-pay | Admitting: Family Medicine

## 2015-03-12 NOTE — Telephone Encounter (Signed)
Approved refill; will obtain Vitamin D level at next visit.

## 2015-03-12 NOTE — Telephone Encounter (Signed)
Dr [name], you saw pt in March for check up, but I don't see vit D lab recently. Do you want to give RFs?

## 2015-03-30 DIAGNOSIS — M359 Systemic involvement of connective tissue, unspecified: Secondary | ICD-10-CM | POA: Diagnosis not present

## 2015-03-30 DIAGNOSIS — M797 Fibromyalgia: Secondary | ICD-10-CM | POA: Diagnosis not present

## 2015-03-30 DIAGNOSIS — L930 Discoid lupus erythematosus: Secondary | ICD-10-CM | POA: Diagnosis not present

## 2015-03-30 DIAGNOSIS — Z79899 Other long term (current) drug therapy: Secondary | ICD-10-CM | POA: Diagnosis not present

## 2015-04-03 DIAGNOSIS — S52501D Unspecified fracture of the lower end of right radius, subsequent encounter for closed fracture with routine healing: Secondary | ICD-10-CM | POA: Diagnosis not present

## 2015-04-17 DIAGNOSIS — Z8262 Family history of osteoporosis: Secondary | ICD-10-CM | POA: Diagnosis not present

## 2015-04-22 ENCOUNTER — Other Ambulatory Visit: Payer: Self-pay | Admitting: Family Medicine

## 2015-04-30 ENCOUNTER — Other Ambulatory Visit: Payer: Self-pay | Admitting: Family Medicine

## 2015-05-03 DIAGNOSIS — M7581 Other shoulder lesions, right shoulder: Secondary | ICD-10-CM | POA: Diagnosis not present

## 2015-05-10 DIAGNOSIS — M47816 Spondylosis without myelopathy or radiculopathy, lumbar region: Secondary | ICD-10-CM | POA: Diagnosis not present

## 2015-05-10 DIAGNOSIS — Z79891 Long term (current) use of opiate analgesic: Secondary | ICD-10-CM | POA: Diagnosis not present

## 2015-05-10 DIAGNOSIS — M150 Primary generalized (osteo)arthritis: Secondary | ICD-10-CM | POA: Diagnosis not present

## 2015-05-10 DIAGNOSIS — M47812 Spondylosis without myelopathy or radiculopathy, cervical region: Secondary | ICD-10-CM | POA: Diagnosis not present

## 2015-05-24 ENCOUNTER — Ambulatory Visit (INDEPENDENT_AMBULATORY_CARE_PROVIDER_SITE_OTHER): Payer: Medicare Other | Admitting: Family Medicine

## 2015-05-24 ENCOUNTER — Ambulatory Visit (INDEPENDENT_AMBULATORY_CARE_PROVIDER_SITE_OTHER): Payer: Medicare Other

## 2015-05-24 VITALS — BP 140/80 | HR 93 | Temp 98.1°F | Resp 18 | Ht 66.0 in | Wt 177.4 lb

## 2015-05-24 DIAGNOSIS — K1379 Other lesions of oral mucosa: Secondary | ICD-10-CM

## 2015-05-24 DIAGNOSIS — S90121A Contusion of right lesser toe(s) without damage to nail, initial encounter: Secondary | ICD-10-CM

## 2015-05-24 DIAGNOSIS — M79674 Pain in right toe(s): Secondary | ICD-10-CM

## 2015-05-24 DIAGNOSIS — K219 Gastro-esophageal reflux disease without esophagitis: Secondary | ICD-10-CM

## 2015-05-24 DIAGNOSIS — S46911A Strain of unspecified muscle, fascia and tendon at shoulder and upper arm level, right arm, initial encounter: Secondary | ICD-10-CM | POA: Diagnosis not present

## 2015-05-24 MED ORDER — TRIAMCINOLONE ACETONIDE 0.1 % MT PSTE
1.0000 | PASTE | Freq: Two times a day (BID) | OROMUCOSAL | Status: DC
Start: 2015-05-24 — End: 2015-06-09

## 2015-05-24 MED ORDER — OMEPRAZOLE 20 MG PO CPDR: DELAYED_RELEASE_CAPSULE | ORAL | Status: DC

## 2015-05-24 NOTE — Patient Instructions (Signed)
Stomatitis °Stomatitis is an inflammation of the mucous lining of the mouth. It can affect part of the mouth or the whole mouth. The intensity of symptoms can range from mild to severe. It can affect your cheek, teeth, gums, lips, or tongue. In almost all cases, the lining of the mouth becomes swollen, red, and painful. Painful ulcers can develop in your mouth. Stomatitis recurs in some people. °CAUSES  °There are many common causes of stomatitis. They include: °· Viruses (such as cold sores or shingles). °· Canker sores. °· Bacteria (such as ulcerative gingivitis or sexually transmitted diseases). °· Fungus or yeast (such as candidiasis or oral thrush). °· Poor oral hygiene and poor nutrition (Vincent's stomatitis or trench mouth). °· Lack of vitamin B, vitamin C, or niacin. °· Dentures or braces that do not fit properly. °· High acid foods (uncommon). °· Sharp or broken teeth. °· Cheek biting. °· Breathing through the mouth. °· Chewing tobacco. °· Allergy to toothpaste, mouthwash, candy, gum, lipstick, or some medicines. °· Burning your mouth with hot drinks or food. °· Exposure to dyes, heavy metals, acid fumes, or mineral dust. °SYMPTOMS  °· Painful ulcers in the mouth. °· Blisters in the mouth. °· Bleeding gums. °· Swollen gums. °· Irritability. °· Bad breath. °· Bad taste in the mouth. °· Fever. °· Trouble eating because of burning and pain in the mouth. °DIAGNOSIS  °Your caregiver will examine your mouth and look for bleeding gums and mouth ulcers. Your caregiver may ask you about the medicines you are taking. Your caregiver may suggest a blood test and tissue sample (biopsy) of the mouth ulcer or mass if either is present. This will help find the cause of your condition. °TREATMENT  °Your treatment will depend on the cause of your condition. Your caregiver will first try to treat your symptoms.  °· You may be given pain medicine. Topical anesthetic may be used to numb the area if you have severe  pain. °· Your caregiver may prescribe antibiotic medicine if you have a bacterial infection. °· Your caregiver may prescribe antifungal medicine if you have a fungal infection. °· You may need to take antiviral medicine if you have a viral infection like herpes. °· You may be asked to use medicated mouth rinses. °· Your caregiver will advise you about proper brushing and using a soft toothbrush. You also need to get your teeth cleaned regularly. °HOME CARE INSTRUCTIONS  °· Maintain good oral hygiene. This is especially important for transplant patients. °· Brush your teeth carefully with a soft, nylon-bristled toothbrush. °· Floss at least 2 times a day. °· Clean your mouth after eating. °· Rinse your mouth with salt water 3 to 4 times a day. °· Gargle with cold water. °· Use topical numbing medicines to decrease pain if recommended by your caregiver. °· Stop smoking, and stop using chewing or smokeless tobacco. °· Avoid eating hot and spicy foods. °· Eat soft and bland food. °· Reduce your stress wherever possible. °· Eat healthy and nutritious foods. °SEEK MEDICAL CARE IF:  °· Your symptoms persist or get worse. °· You develop new symptoms. °· Your mouth ulcers are present for more than 3 weeks. °· Your mouth ulcers come back frequently. °· You have increasing difficulty with normal eating and drinking. °· You have increasing fatigue or weakness. °· You develop loss of appetite or nausea. °SEEK IMMEDIATE MEDICAL CARE IF:  °· You have a fever. °· You develop pain, redness, or sores around one or both   eyes. °· You cannot eat or drink because of pain or other symptoms. °· You develop worsening weakness, or you faint. °· You develop vomiting or diarrhea. °· You develop chest pain, shortness of breath, or rapid and irregular heartbeats. °MAKE SURE YOU: °· Understand these instructions. °· Will watch your condition. °· Will get help right away if you are not doing well or get worse. °Document Released: 09/14/2007  Document Revised: 02/09/2012 Document Reviewed: 06/26/2011 °[employer]® Patient Information ©2015 [employer]. This information is not intended to replace advice given to you by your health care provider. Make sure you discuss any questions you have with your health care provider. ° °

## 2015-05-24 NOTE — Progress Notes (Signed)
Subjective:    Patient ID: David Weaver, male    DOB: 10-18-1951, 65 y.o.   MRN: [id number]  05/24/2015  Mouth Lesions; Shoulder Pain; and Toe Pain   HPI This 64 y.o. male presents for evaluation of the following:  1.  Mouth sores: Dr. Kathee Delton prescribed mouth wash with some improvement; one single lesion will not go away.  Onset one month ago.  Discussed with dentist first; sent in mouthwash.  Then no improvement. Then read about side effect of Plaquenil is mouth sores; sent in magic mouthwash without improvement.  Dentist also sent in magic mouthwash.  Worried about cancer.  Dentist thinks that patient is losing teeth due to SLE.  Dentist does not know why losing teeth.    2.  R toe fifth: bruised; no injury; painful.  No limping.  Hurts with walking.    3.  R shoulder pain: s/p injection by Noemi Chapel; radiates into neck and down elbow.  Injection was 3 weeks ago without any improvement.  No n/t/burning.  Has three bruising along R arm unknown etiology.  Reaching back behind makes worse; painting at work makes worse.  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  Already on [employer]; working 40 hours.    4. GERD: requesting refill of Omeprazole 40mg  daily; no reflux symptoms; denies n/v/d/c; denies melena or bloody stools.  Review of Systems  Constitutional: Negative for fever, chills, diaphoresis and fatigue.  HENT: Positive for mouth sores.   Gastrointestinal: Negative for nausea, vomiting, abdominal pain, diarrhea, constipation, blood in stool, abdominal distention, anal bleeding and rectal pain.  Musculoskeletal: Positive for joint swelling, arthralgias and gait problem. Negative for neck pain and neck stiffness.  Neurological: Negative for weakness and numbness.    Past Medical History  Diagnosis Date  . Other abnormal glucose   . Allergic rhinitis, cause unspecified   . Osteoarthrosis, unspecified whether generalized or localized, lower leg   . Other specified  disorder of male genital organs(608.89)   . Other malaise and fatigue   . Alcohol abuse, daily use   . Essential hypertension, benign   . Esophageal reflux   . Lupus erythematosus   . Unspecified hearing loss   . Insomnia, unspecified   . Unspecified vitamin D deficiency   . Left knee DJD   . Shortness of breath   . Fibromyalgia     followed by [name] every six months.  . Tobacco use disorder     quit smokeing 2012  . Dysthymic disorder   . Impotence of organic origin    Past Surgical History  Procedure Laterality Date  . Cholecystectomy    . Carpal tunnel release      bilateral  . Ganglion cyst excision  1980    Left wrist  . Knee arthroscopy  2008    Right  . Lumbar spine epidural  2009    [facility]  . Rotator cuff repair  10/2008    right  . Total knee arthroplasty  12/27/2012    Procedure: TOTAL KNEE ARTHROPLASTY;  Surgeon: [name], MD;  Location: [facility];  Service: Orthopedics;  Laterality: Right;  RIGHT ARTHROPLASTY KNEE MEDIAL AND LATERAL COMPARTMENTS WITH PATELLA RESURFACING, RIGHT TOTAL KNEE REPLACEMENT  . Total knee arthroplasty Left 01/31/2013    Procedure: TOTAL KNEE ARTHROPLASTY;  Surgeon: [name], MD;  Location: [facility];  Service: Orthopedics;  Laterality: Left;  . Joint replacement  01/2013    right knee, left  knee. Noemi Chapel.  . Colonoscopy  03/01/2014    two polyps; Dr. Benson Norway.  Repeat 5 years.   No Known Allergies Current Outpatient Prescriptions  Medication Sig Dispense Refill  . amLODipine (NORVASC) 10 MG tablet TAKE 1 TABLET (10 MG TOTAL)  BY MOUTH DAILY. "OV NEEDED FOR ADDITIONAL REFILLS" 30 tablet 0  . aspirin 81 MG tablet Take 81 mg by mouth daily.    . diclofenac sodium (VOLTAREN) 1 % GEL Apply 2-4 g topically 4 (four) times daily. 100 g 11  . DULoxetine (CYMBALTA) 60 MG capsule TAKE 1 CAPSULE (60 MG TOTAL) BY MOUTH 2 (TWO) TIMES DAILY. "OV NEEDED FOR ADDITIONAL REFILLS" 60 capsule 0  . hydroxychloroquine (PLAQUENIL) 200 MG  tablet Take 200 mg by mouth 2 (two) times daily.    . meloxicam (MOBIC) 15 MG tablet TAKE 1 TABLET BY MOUTH EVERY DAY 30 tablet 3  . omeprazole (PRILOSEC) 20 MG capsule TAKE 1 CAPSULE (40 MG TOTAL) BY MOUTH DAILY. 30 capsule 11  . oxyCODONE (OXY IR/ROXICODONE) 5 MG immediate release tablet 1 tablet twice daily for pain (Patient taking differently: 1 tablet three times daily for pain) 60 tablet 0  . traZODone (DESYREL) 50 MG tablet Take 1-2 tablets (50-100 mg total) by mouth at bedtime as needed for sleep. 60 tablet 11  . Vitamin D, Ergocalciferol, (DRISDOL) 50000 UNITS CAPS capsule TAKE 1 CAPSULE (50,000 UNITS TOTAL) BY MOUTH EVERY 7 (SEVEN) DAYS. DUE FOR VIT D LAB AT FOLLOW UP. 4 capsule 5  . methocarbamol (ROBAXIN) 500 MG tablet TAKE 1 TO 2 TABLETS BY MOUTH EVERY 8 HOURS AS NEEDED FOR MUSCLEPASMS (Patient not taking: Reported on 02/12/2015) 60 tablet 0  . triamcinolone (KENALOG) 0.1 % paste Use as directed 1 application in the mouth or throat 2 (two) times daily. 5 g 0   No current facility-administered medications for this visit.       Objective:    BP 140/80 mmHg  Pulse 93  Temp(Src) 98.1 F (36.7 C) (Oral)  Resp 18  Ht 5\' 6"  (1.676 m)  Wt 177 lb 6 oz (80.457 kg)  BMI 28.64 kg/m2  SpO2 97% Physical Exam  Constitutional: He is oriented to person, place, and time. He appears well-developed and well-nourished. No distress.  HENT:  Head: Normocephalic and atraumatic.  Eyes: Conjunctivae and EOM are normal. Pupils are equal, round, and reactive to light.  Neck: Normal range of motion. Neck supple. Carotid bruit is not present. No thyromegaly present.  Cardiovascular: Normal rate, regular rhythm, normal heart sounds and intact distal pulses.  Exam reveals no gallop and no friction rub.   No murmur heard. Pulmonary/Chest: Effort normal and breath sounds normal. He has no wheezes. He has no rales.  Musculoskeletal:       Right shoulder: He exhibits pain. He exhibits normal range of  motion, no tenderness, no bony tenderness, no spasm, normal pulse and normal strength.       Cervical back: Normal. He exhibits normal range of motion, no tenderness, no bony tenderness, no swelling and no pain.       Right foot: There is tenderness, bony tenderness and swelling. There is normal range of motion, normal capillary refill, no deformity and no laceration.  R SHOULDER: FULL ROM R SHOULDER; ELEVATION TO 180 DEGREES WITHOUT PAIN OR LIMITATION; +PAIN WITH POSTERIOR MOVEMENT.  INTERNAL AND EXTERNAL ROTATION INTACT AND WITHOUT PAIN; EMPTY CAN NEGATIVE; CROSS OVER NEGATIVE.  GRIP 5/5.   R FOOT:  +TTP fifth digit with diffuse swelling; +  TTP proximal fifth digit.  Painful ROM.  Lymphadenopathy:    He has no cervical adenopathy.  Neurological: He is alert and oriented to person, place, and time. No cranial nerve deficit.  Skin: Skin is warm and dry. No rash noted. He is not diaphoretic.  Psychiatric: He has a normal mood and affect. His behavior is normal.  Nursing note and vitals reviewed.   [facility] reading (PRIMARY) by  Dr. [name].  R FOOT FIFTH DIGIT: NAD      Assessment & Plan:   1. Toe pain, right   2. Mouth sores   3. Right shoulder strain, initial encounter   4. Contusion, toe, right, initial encounter   5. Gastroesophageal reflux disease without esophagitis     1.  R toe pain/contusion: New.  Recommend rest, icing, supportive shoe. 2.  Mouth sores/stomatitis:  New.  One single lesion persistent; rx for Triamcinolone paste 0.1% to apply bid for ten days. If persists, will warrant bx. 3.  R shoulder strain: persistent despite steroid injection by ortho; concerning for rotator cuff pathology; advised to contact ortho; may warrant MRI shoulder. 4. GERD: controlled; decrease Omeprazole to 20mg  daily.   Meds ordered this encounter  Medications  . triamcinolone (KENALOG) 0.1 % paste    Sig: Use as directed 1 application in the mouth or throat 2 (two) times daily.    Dispense:  5  g    Refill:  0  . omeprazole (PRILOSEC) 20 MG capsule    Sig: TAKE 1 CAPSULE (40 MG TOTAL) BY MOUTH DAILY.    Dispense:  30 capsule    Refill:  11    No Follow-up on file.    Kristi Elayne Guerin, M.D. [facility] [address] [place], [place]  [address] [phone] phone [phone] fax

## 2015-05-30 DIAGNOSIS — M542 Cervicalgia: Secondary | ICD-10-CM | POA: Diagnosis not present

## 2015-05-30 DIAGNOSIS — S52501D Unspecified fracture of the lower end of right radius, subsequent encounter for closed fracture with routine healing: Secondary | ICD-10-CM | POA: Diagnosis not present

## 2015-06-05 ENCOUNTER — Other Ambulatory Visit: Payer: Self-pay | Admitting: Physician Assistant

## 2015-06-08 DIAGNOSIS — M542 Cervicalgia: Secondary | ICD-10-CM | POA: Diagnosis not present

## 2015-06-09 ENCOUNTER — Other Ambulatory Visit: Payer: Self-pay | Admitting: Family Medicine

## 2015-06-11 ENCOUNTER — Ambulatory Visit: Payer: PRIVATE HEALTH INSURANCE | Admitting: Family Medicine

## 2015-06-11 DIAGNOSIS — M25511 Pain in right shoulder: Secondary | ICD-10-CM | POA: Diagnosis not present

## 2015-06-12 DIAGNOSIS — K120 Recurrent oral aphthae: Secondary | ICD-10-CM | POA: Diagnosis not present

## 2015-06-13 ENCOUNTER — Other Ambulatory Visit: Payer: Self-pay | Admitting: Physician Assistant

## 2015-06-18 ENCOUNTER — Other Ambulatory Visit: Payer: Self-pay | Admitting: Family Medicine

## 2015-06-20 DIAGNOSIS — J029 Acute pharyngitis, unspecified: Secondary | ICD-10-CM | POA: Diagnosis not present

## 2015-06-20 DIAGNOSIS — M25511 Pain in right shoulder: Secondary | ICD-10-CM | POA: Diagnosis not present

## 2015-06-28 DIAGNOSIS — Z79899 Other long term (current) drug therapy: Secondary | ICD-10-CM | POA: Diagnosis not present

## 2015-06-28 DIAGNOSIS — R300 Dysuria: Secondary | ICD-10-CM | POA: Diagnosis not present

## 2015-06-28 DIAGNOSIS — S52501D Unspecified fracture of the lower end of right radius, subsequent encounter for closed fracture with routine healing: Secondary | ICD-10-CM | POA: Diagnosis not present

## 2015-07-03 ENCOUNTER — Telehealth: Payer: Self-pay

## 2015-07-03 NOTE — Telephone Encounter (Signed)
Patient's wife [name] is calling because  Dr. Dorothe Pea from the orthopedic office faxed over a from for Dr. [name] to fill out for surgical clearance. She wants to know the status of the form. Please call [name]! [phone]

## 2015-07-04 NOTE — Telephone Encounter (Signed)
Dr. [name], have you seen this form? Does he need to RTC?

## 2015-07-05 DIAGNOSIS — M47816 Spondylosis without myelopathy or radiculopathy, lumbar region: Secondary | ICD-10-CM | POA: Diagnosis not present

## 2015-07-05 DIAGNOSIS — Z79891 Long term (current) use of opiate analgesic: Secondary | ICD-10-CM | POA: Diagnosis not present

## 2015-07-05 DIAGNOSIS — M47812 Spondylosis without myelopathy or radiculopathy, cervical region: Secondary | ICD-10-CM | POA: Diagnosis not present

## 2015-07-05 DIAGNOSIS — M150 Primary generalized (osteo)arthritis: Secondary | ICD-10-CM | POA: Diagnosis not present

## 2015-07-12 NOTE — Telephone Encounter (Signed)
Call --- pt has OV with me on 07/16/15; needs OV to complete surgical clearance.  I will complete form after visit on Monday, 8/15. Please advise pt.

## 2015-07-12 NOTE — Telephone Encounter (Signed)
Wife notified.

## 2015-07-16 ENCOUNTER — Ambulatory Visit (INDEPENDENT_AMBULATORY_CARE_PROVIDER_SITE_OTHER): Payer: Medicare Other

## 2015-07-16 ENCOUNTER — Encounter: Payer: Self-pay | Admitting: Family Medicine

## 2015-07-16 ENCOUNTER — Ambulatory Visit (INDEPENDENT_AMBULATORY_CARE_PROVIDER_SITE_OTHER): Payer: Medicare Other | Admitting: Family Medicine

## 2015-07-16 VITALS — BP 134/80 | HR 99 | Temp 98.0°F | Resp 16 | Ht 66.0 in | Wt 174.6 lb

## 2015-07-16 DIAGNOSIS — G4700 Insomnia, unspecified: Secondary | ICD-10-CM | POA: Diagnosis not present

## 2015-07-16 DIAGNOSIS — R9431 Abnormal electrocardiogram [ECG] [EKG]: Secondary | ICD-10-CM | POA: Diagnosis not present

## 2015-07-16 DIAGNOSIS — L930 Discoid lupus erythematosus: Secondary | ICD-10-CM | POA: Diagnosis not present

## 2015-07-16 DIAGNOSIS — Z01818 Encounter for other preprocedural examination: Secondary | ICD-10-CM | POA: Diagnosis not present

## 2015-07-16 DIAGNOSIS — G894 Chronic pain syndrome: Secondary | ICD-10-CM | POA: Diagnosis not present

## 2015-07-16 DIAGNOSIS — F329 Major depressive disorder, single episode, unspecified: Secondary | ICD-10-CM

## 2015-07-16 DIAGNOSIS — K219 Gastro-esophageal reflux disease without esophagitis: Secondary | ICD-10-CM | POA: Diagnosis not present

## 2015-07-16 DIAGNOSIS — R7302 Impaired glucose tolerance (oral): Secondary | ICD-10-CM | POA: Diagnosis not present

## 2015-07-16 DIAGNOSIS — E559 Vitamin D deficiency, unspecified: Secondary | ICD-10-CM

## 2015-07-16 DIAGNOSIS — I10 Essential (primary) hypertension: Secondary | ICD-10-CM

## 2015-07-16 DIAGNOSIS — F32A Depression, unspecified: Secondary | ICD-10-CM

## 2015-07-16 LAB — POCT URINALYSIS DIPSTICK
BILIRUBIN UA: NEGATIVE
Blood, UA: NEGATIVE
Glucose, UA: NEGATIVE
Ketones, UA: NEGATIVE
LEUKOCYTES UA: NEGATIVE
Nitrite, UA: NEGATIVE
Protein, UA: NEGATIVE
Spec Grav, UA: 1.015
Urobilinogen, UA: 0.2
pH, UA: 7.5

## 2015-07-16 NOTE — Progress Notes (Signed)
Subjective:    Patient ID: David Weaver, male    DOB: Apr 17, 1951, 64 y.o.   MRN: [id number]  07/16/2015  surgical clearance   HPI This 64 y.o. male presents for evaluation for pre-operative clearance for shoulder surgery by [facility].  +Torn rotator cuff on R shoulder.  Needs surgical repair. Has not decided if going to have surgery or not.  Needs clearance.  No chest pain, palpitations, SOB, leg swelling.  Quit walking for one month due to excessive work. Walking one mile daily on treadmill; has been doing walking for several months.  Can walk one mile in 17 minutes.  Last stress test atleast five years ago.    HTN: Patient reports good compliance with medication, good tolerance to medication, and good symptom control.  Checking BP some at home.  Can be high at times.  Glucose intolerance:  Rare sugar intake.  Rare bread; low calorie bread.  Will eat a biscuit.    Toe fracture fifth R: slowly improving.  Buddy taping it some.  Pain at end of day due to boots.  On feet all day.  Mouth ulcer:   Completely resolved; intermittent; went ENT because not healing; cauterized; no biopsy.    Depression with anxiety: Patient reports good compliance with medication, good tolerance to medication, and good symptom control.  Wife had an AMI.  Spending a lot of time in his building.  Coping with stepdaughter and grandchildren living with patient.  Denies SI/HI.  Taking trazodone qhs with good results.    Chronic pain syndrome: taking oxycodone 5mg  tid for chronic joint pain; pain is well controlled.  Phillips at pain management.  Insomnia: taking Trazodone qhs.  Sleeping well.  No tobacco; quit three years ago.  Chantix three years ago.  Alcohol 2-3 beers per day; no liquor in 3 months.  Memory loss: will forget what pt is going to get at work; mother with Alzheimer's disease.     Review of Systems  Constitutional: Negative for fever, chills, diaphoresis, activity change, appetite change and  fatigue.  Respiratory: Negative for cough and shortness of breath.   Cardiovascular: Negative for chest pain, palpitations and leg swelling.  Gastrointestinal: Negative for nausea, vomiting, abdominal pain and diarrhea.  Endocrine: Negative for cold intolerance, heat intolerance, polydipsia, polyphagia and polyuria.  Musculoskeletal: Positive for back pain and arthralgias. Negative for joint swelling and gait problem.  Skin: Negative for color change, rash and wound.  Neurological: Negative for dizziness, tremors, seizures, syncope, facial asymmetry, speech difficulty, weakness, light-headedness, numbness and headaches.  Psychiatric/Behavioral: Negative for sleep disturbance and dysphoric mood. The patient is not nervous/anxious.     Past Medical History  Diagnosis Date  . Other abnormal glucose   . Allergic rhinitis, cause unspecified   . Osteoarthrosis, unspecified whether generalized or localized, lower leg   . Other specified disorder of male genital organs(608.89)   . Alcohol abuse, daily use   . Essential hypertension, benign   . Esophageal reflux   . Lupus erythematosus   . Unspecified hearing loss   . Insomnia, unspecified   . Unspecified vitamin D deficiency   . Left knee DJD   . Fibromyalgia     followed by [name] every six months.  . Dysthymic disorder   . Impotence of organic origin    Past Surgical History  Procedure Laterality Date  . Cholecystectomy    . Carpal tunnel release      bilateral  . Ganglion cyst excision  1980  Left wrist  . Knee arthroscopy  2008    Right  . Lumbar spine epidural  2009    [facility]  . Rotator cuff repair  10/2008    right  . Total knee arthroplasty  12/27/2012    Procedure: TOTAL KNEE ARTHROPLASTY;  Surgeon: [name], MD;  Location: [facility];  Service: Orthopedics;  Laterality: Right;  RIGHT ARTHROPLASTY KNEE MEDIAL AND LATERAL COMPARTMENTS WITH PATELLA RESURFACING, RIGHT TOTAL KNEE REPLACEMENT  . Total knee  arthroplasty Left 01/31/2013    Procedure: TOTAL KNEE ARTHROPLASTY;  Surgeon: [name], MD;  Location: [facility];  Service: Orthopedics;  Laterality: Left;  . Joint replacement  01/2013    right knee, left knee. [facility].  . Colonoscopy  03/01/2014    two polyps; Dr. Benson Norway.  Repeat 5 years.   No Known Allergies Current Outpatient Prescriptions  Medication Sig Dispense Refill  . amLODipine (NORVASC) 10 MG tablet TAKE 1 TABLET (10 MG TOTAL) BY MOUTH DAILY. 30 tablet 1  . aspirin 81 MG tablet Take 81 mg by mouth daily.    . diclofenac sodium (VOLTAREN) 1 % GEL Apply 2-4 g topically 4 (four) times daily. 100 g 11  . DULoxetine (CYMBALTA) 60 MG capsule Take 1 capsule (60 mg total) by mouth 2 (two) times daily. 60 capsule 2  . hydroxychloroquine (PLAQUENIL) 200 MG tablet Take 200 mg by mouth 2 (two) times daily.    . meloxicam (MOBIC) 15 MG tablet TAKE 1 TABLET BY MOUTH EVERY DAY 30 tablet 3  . methocarbamol (ROBAXIN) 500 MG tablet TAKE 1 TO 2 TABLETS BY MOUTH EVERY 8 HOURS AS NEEDED FOR MUSCLEPASMS 60 tablet 0  . omeprazole (PRILOSEC) 20 MG capsule TAKE 1 CAPSULE (40 MG TOTAL) BY MOUTH DAILY. 30 capsule 11  . oxyCODONE (OXY IR/ROXICODONE) 5 MG immediate release tablet 1 tablet twice daily for pain (Patient taking differently: 1 tablet three times daily for pain) 60 tablet 0  . traZODone (DESYREL) 50 MG tablet TAKE 1 TO 2 TABLETS BY MOUTH EVERY DAY AT BEDTIME AS NEEDED FOR SLEEP 60 tablet 1  . Vitamin D, Ergocalciferol, (DRISDOL) 50000 UNITS CAPS capsule TAKE 1 CAPSULE (50,000 UNITS TOTAL) BY MOUTH EVERY 7 (SEVEN) DAYS. DUE FOR VIT D LAB AT FOLLOW UP. 4 capsule 5   No current facility-administered medications for this visit.   Social History   Social History  . Marital Status: Married    Spouse Name: N/A  . Number of Children: 1  . Years of Education: N/A   Occupational History  . [employer]     [employer]s cars/trucks   Social History Main Topics  . Smoking status: Former Smoker -- 2.00  packs/day for 30 years    Types: Cigarettes    Quit date: 12/28/2008  . Smokeless tobacco: Never Used     Comment: quit 12 /2011  . Alcohol Use: 6.0 oz/week    6 Cans of beer, 4 Standard drinks or equivalent per week     Comment: 2 drinks of liquor, every afternoon    12/28/2012  wekends 4 5 beers  . Drug Use: No  . Sexual Activity: Yes   Other Topics Concern  . Not on file   Social History Narrative   Guns in the home stored in locked cabinet.       Caffeine use: 1 serving/ day.     Marital status:  Married x 18 years, third marriage, happily married.      Children: one  son (63); one step-daughter; two grandsons      Lives: with wife, son.      Employment:  Works at 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      Tobacco: none currently; quit 2013.  Smoked x 40 years.      Alcohol:  1 pint per night in 05/629; son is alcoholic.        Drugs: none      Exercise:  Gym membership in 2015; 2-3 nights per week.      Seatbelt: 100%   Family History  Problem Relation Age of Onset  . Alzheimer's disease Mother   . Osteoporosis Mother   . Heart disease Father 28    AMI  . Alcohol abuse Father   . Cancer Brother     colon cancer  . Diabetes Brother   . Kidney failure Brother         Objective:    BP 134/80 mmHg  Pulse 99  Temp(Src) 98 F (36.7 C) (Oral)  Resp 16  Ht 5\' 6"  (1.676 m)  Wt 174 lb 9.6 oz (79.198 kg)  BMI 28.19 kg/m2  SpO2 98% Physical Exam  Constitutional: He is oriented to person, place, and time. He appears well-developed and well-nourished. No distress.  HENT:  Head: Normocephalic and atraumatic.  Right Ear: External ear normal.  Left Ear: External ear normal.  Nose: Nose normal.  Mouth/Throat: Oropharynx is clear and moist.  Eyes: Conjunctivae and EOM are normal. Pupils are equal, round, and reactive to light.  Neck: Normal range of motion. Neck supple. Carotid bruit is not present. No thyromegaly present.    Cardiovascular: Normal rate, regular rhythm, normal heart sounds and intact distal pulses.  Exam reveals no gallop and no friction rub.   No murmur heard. Pulmonary/Chest: Effort normal and breath sounds normal. He has no wheezes. He has no rales.  Abdominal: Soft. Bowel sounds are normal. He exhibits no distension and no mass. There is no tenderness. There is no rebound and no guarding.  Lymphadenopathy:    He has no cervical adenopathy.  Neurological: He is alert and oriented to person, place, and time. No cranial nerve deficit.  Skin: Skin is warm and dry. No rash noted. He is not diaphoretic.  Psychiatric: He has a normal mood and affect. His behavior is normal.  Nursing note and vitals reviewed.  Results for orders placed or performed in visit on 07/16/15  POCT urinalysis dipstick  Result Value Ref Range   Color, UA yellow    Clarity, UA clear    Glucose, UA neg    Bilirubin, UA neg    Ketones, UA neg    Spec Grav, UA 1.015    Blood, UA neg    pH, UA 7.5    Protein, UA neg    Urobilinogen, UA 0.2    Nitrite, UA neg    Leukocytes, UA Negative Negative   EKG: NSR; nonspecific ST changes  [facility] reading (PRIMARY) by  Dr. [name]. CXR: NAD      Assessment & Plan:   1. Pre-operative clearance   2. Nonspecific abnormal electrocardiogram (ECG) (EKG)   3. Essential hypertension, benign   4. Vitamin D deficiency   5. Lupus erythematosus   6. Insomnia   7. Gastroesophageal reflux disease without esophagitis   8. Chronic pain syndrome   9. Depression   10. Glucose intolerance (impaired glucose tolerance)     1. Pre-operative clearance for shoulder  surgery: with abnormal EKG.  Asymptomatic. Refer to cardiology for cardiac clearance due to abnormal EKG with multiple cardiac risk factors including age, male gender, HTN, tobacco hx, alcohol use.   2.  Abnormal EKG: refer to cardiology for clearance for surgery; multiple cardiac risk factors. 3.  HTN; controlled; obtain labs;  continue current medications. 4.  Vitamin D deficiency: stable; recent labs by Dr. Kathee Delton; obtain lab copy. 5.  SLE discoid: stable; followed by [name] of rheumatology. 6.  Insomnia: well controlled with Trazodone qhs. 7.  GERD: controlled with decrease in Omeprazole to 20mg  daily. 8.  Chronic pain syndrome: stable; followed by Dr. Hardin Negus of pain management every two months. 9. Depression and anxiety: controlled with Cymbalta 120mg  daily. 10.  Glucose intolerance: stable; obtain labs; continue with dietary modification. 11. Memory loss: New.  Obtain MMSE. Obtain TSH.  Mother with Alzheimer's disease; recommend continued exercise and decreased alcohol intake.   No orders of the defined types were placed in this encounter.    Return in about 4 months (around 11/15/2015) for complete physical examiniation.     Exander Shaul Elayne Guerin, M.D. [facility] [address] [place], [place]  [address] [phone] phone [phone] fax

## 2015-07-23 ENCOUNTER — Ambulatory Visit: Payer: Medicare Other | Admitting: Family Medicine

## 2015-08-01 ENCOUNTER — Telehealth: Payer: Self-pay

## 2015-08-01 DIAGNOSIS — I10 Essential (primary) hypertension: Secondary | ICD-10-CM | POA: Diagnosis not present

## 2015-08-01 DIAGNOSIS — Z01810 Encounter for preprocedural cardiovascular examination: Secondary | ICD-10-CM | POA: Diagnosis not present

## 2015-08-01 NOTE — Telephone Encounter (Signed)
Request for surgical clearance:  1. What type of surgery is being performed? Right shoulder scope and rotator cuff repair 2. When is this surgery schedule? Pending 3. Are there any medications that need to be held prior to surgery and how long? Not requested, but patient is on Aspirin 81 mg QD 4. Physician performing surgery? Dr Elsie Saas 5. Office phone and fax number? Phone - [phone] ext [phone], Fax - [phone]  **Patient is scheduled to see you on 08/08/15 at 11:15a.

## 2015-08-07 NOTE — Progress Notes (Signed)
Cardiology Office Note   Date:  08/08/2015   ID:  David Weaver, DOB 1951/10/15, MRN [id number]  PCP:  Reginia Forts, MD  Cardiologist:   Sharol Harness, MD   Chief Complaint  Patient presents with  . New Evaluation    Patient has no complaints.      History of Present Illness: David Weaver is a 64 y.o. male with hypertension and lupus who presents for presurgical assessment.  David Weaver was seen by his PCP, Dr. Reginia Forts on 07/16/15.  At that time his ECG revealed mild ST depression with upright T waves.  Given his ECG abnormality and his risk factors for CAD, he was referred to cardiology for preoperative risk assessment.  He feels well and walks one mile three times per week.  This takes him approximately 20 minutes.  He does not get CP or SOB.  He is able to walk up 2 flights of stairs without symptoms.  David Weaver denies lower extremity edema, orthopnea, PND or palpitations.  He does occasionally note lighteheadedness but denies syncope.  David Weaver is planning to undergo R shoulder scope and rotator cuff repair with Dr. Elsie Saas.     Past Medical History  Diagnosis Date  . Other abnormal glucose   . Allergic rhinitis, cause unspecified   . Osteoarthrosis, unspecified whether generalized or localized, lower leg   . Other specified disorder of male genital organs(608.89)   . Alcohol abuse, daily use   . Essential hypertension, benign   . Esophageal reflux   . Lupus erythematosus   . Unspecified hearing loss   . Insomnia, unspecified   . Unspecified vitamin D deficiency   . Left knee DJD   . Fibromyalgia     followed by [name] every six months.  . Dysthymic disorder   . Impotence of organic origin     Past Surgical History  Procedure Laterality Date  . Cholecystectomy    . Carpal tunnel release      bilateral  . Ganglion cyst excision  1980    Left wrist  . Knee arthroscopy  2008    Right  . Lumbar spine epidural  2009    [facility]  .  Rotator cuff repair  10/2008    right  . Total knee arthroplasty  12/27/2012    Procedure: TOTAL KNEE ARTHROPLASTY;  Surgeon: [name], MD;  Location: [facility];  Service: Orthopedics;  Laterality: Right;  RIGHT ARTHROPLASTY KNEE MEDIAL AND LATERAL COMPARTMENTS WITH PATELLA RESURFACING, RIGHT TOTAL KNEE REPLACEMENT  . Total knee arthroplasty Left 01/31/2013    Procedure: TOTAL KNEE ARTHROPLASTY;  Surgeon: [name], MD;  Location: [facility];  Service: Orthopedics;  Laterality: Left;  . Joint replacement  01/2013    right knee, left knee. [facility].  . Colonoscopy  03/01/2014    two polyps; Dr. Benson Norway.  Repeat 5 years.     Current Outpatient Prescriptions  Medication Sig Dispense Refill  . amLODipine (NORVASC) 10 MG tablet TAKE 1 TABLET (10 MG TOTAL) BY MOUTH DAILY. 30 tablet 1  . aspirin 81 MG tablet Take 81 mg by mouth daily.    . Cyanocobalamin (VITAMIN B12 PO) Take by mouth daily.    . diclofenac sodium (VOLTAREN) 1 % GEL Apply 2-4 g topically 4 (four) times daily. 100 g 11  . DULoxetine (CYMBALTA) 60 MG capsule Take 1 capsule (60 mg total) by mouth 2 (two) times daily. 60 capsule 2  . hydroxychloroquine (PLAQUENIL)  200 MG tablet Take 200 mg by mouth 2 (two) times daily.    . meloxicam (MOBIC) 15 MG tablet TAKE 1 TABLET BY MOUTH EVERY DAY 30 tablet 3  . methocarbamol (ROBAXIN) 500 MG tablet TAKE 1 TO 2 TABLETS BY MOUTH EVERY 8 HOURS AS NEEDED FOR MUSCLEPASMS 60 tablet 0  . omeprazole (PRILOSEC) 20 MG capsule TAKE 1 CAPSULE (40 MG TOTAL) BY MOUTH DAILY. 30 capsule 11  . oxyCODONE (OXY IR/ROXICODONE) 5 MG immediate release tablet 1 tablet twice daily for pain (Patient taking differently: 1 tablet three times daily for pain) 60 tablet 0  . traZODone (DESYREL) 50 MG tablet TAKE 1 TO 2 TABLETS BY MOUTH EVERY DAY AT BEDTIME AS NEEDED FOR SLEEP 60 tablet 1  . Vitamin D, Ergocalciferol, (DRISDOL) 50000 UNITS CAPS capsule TAKE 1 CAPSULE (50,000 UNITS TOTAL) BY MOUTH EVERY 7 (SEVEN) DAYS. DUE FOR VIT D  LAB AT FOLLOW UP. 4 capsule 5   No current facility-administered medications for this visit.    Allergies:   Review of patient's allergies indicates no known allergies.    Social History:  The patient  reports that he quit smoking about 6 years ago. His smoking use included Cigarettes. He has a 60 pack-year smoking history. He has never used smokeless tobacco. He reports that he drinks about 6.0 oz of alcohol per week. He reports that he does not use illicit drugs.   Family History:  The patient's family history includes Alcohol abuse in his father; Alzheimer's disease in his mother; Cancer in his brother; Diabetes in his brother; Heart disease (age of onset: 39) in his father; Kidney failure in his brother; Osteoporosis in his mother.    ROS:  Please see the history of present illness.   Otherwise, review of systems are positive for shoulder pain.   All other systems are reviewed and negative.    PHYSICAL EXAM: VS:  BP 138/92 mmHg  Pulse 99  Ht 5\' 8"  (1.727 m)  Wt 79.833 kg (176 lb)  BMI 26.77 kg/m2 , BMI Body mass index is 26.77 kg/(m^2). GENERAL:  Well appearing HEENT:  Pupils equal round and reactive, fundi not visualized, oral mucosa unremarkable NECK:  No jugular venous distention, waveform within normal limits, carotid upstroke brisk and symmetric, no bruits, no thyromegaly LYMPHATICS:  No cervical adenopathy LUNGS:  Clear to auscultation bilaterally HEART:  RRR.  PMI not displaced or sustained,S1 and S2 within normal limits, no S3, no S4, no clicks, no rubs, no murmurs ABD:  Flat, positive bowel sounds normal in frequency in pitch, no bruits, no rebound, no guarding, no midline pulsatile mass, no hepatomegaly, no splenomegaly EXT:  2 plus pulses throughout, no edema, no cyanosis no clubbing SKIN:  No rashes no nodules NEURO:  Cranial nerves II through XII grossly intact, motor grossly intact throughout PSYCH:  Cognitively intact, oriented to person place and time    EKG:   EKG is ordered today. The ekg ordered today demonstrates sinus rhythm at 99 bpm.  8/15;16: Sinus rhythm 93 bpm.  Non-specific t wave changes. 04/05/14: Ectopic atrial rhythm. Non-specific inferior T wave changes.  Recent Labs: 10/11/2014: ALT 28; BUN 13; Creat 0.68; Hemoglobin 15.4; Platelets 271; Potassium 3.8; Sodium 139; TSH 2.458    Lipid Panel    Component Value Date/Time   CHOL 219* 04/03/2014 0921   TRIG 275* 04/03/2014 0921   HDL 60 04/03/2014 0921   CHOLHDL 3.7 04/03/2014 0921   VLDL 55* 04/03/2014 0921   LDLCALC 104* 04/03/2014  3254      Wt Readings from Last 3 Encounters:  08/08/15 79.833 kg (176 lb)  07/16/15 79.198 kg (174 lb 9.6 oz)  05/24/15 80.457 kg (177 lb 6 oz)      Other studies Reviewed: Additional studies/ records that were reviewed today include:. Review of the above records demonstrates:  Please see elsewhere in the note.     ASSESSMENT AND PLAN:  # Presurgical risk assessment: The patient does not have any unstable cardiac conditions.  Upon evaluation today, he can achieve 4 METs or greater without anginal symptoms.  According to [facility] and AHA guidelines, he requires no further cardiac workup prior to his noncardiac surgery and should be at acceptable risk.  His NSQIP risk of peri-procedural MI or cardiac arrest is 0.16%.  Please continue aspirin peri-operatively if possible.  Our service is available as necessary in the perioperative period.   # Hypertension: BP well-controlled.  Continue amlodipine.   Current medicines are reviewed at length with the patient today.  The patient does not have concerns regarding medicines.  The following changes have been made:  no change  Labs/ tests ordered today include: No orders of the defined types were placed in this encounter.     Disposition:   FU with Dr. Jonelle Sidle C. [place] prn.    Signed, Sharol Harness, MD  08/08/2015 6:48 PM    [facility]

## 2015-08-08 ENCOUNTER — Ambulatory Visit (INDEPENDENT_AMBULATORY_CARE_PROVIDER_SITE_OTHER): Payer: Medicare Other | Admitting: Cardiovascular Disease

## 2015-08-08 ENCOUNTER — Encounter: Payer: Self-pay | Admitting: Cardiovascular Disease

## 2015-08-08 VITALS — BP 138/92 | HR 99 | Ht 68.0 in | Wt 176.0 lb

## 2015-08-08 DIAGNOSIS — Z01810 Encounter for preprocedural cardiovascular examination: Secondary | ICD-10-CM

## 2015-08-08 DIAGNOSIS — I10 Essential (primary) hypertension: Secondary | ICD-10-CM

## 2015-08-08 NOTE — Patient Instructions (Addendum)
No change at prescription   [place].  Your physician wants you to follow-up in 12 months with Dr Oval Linsey  You will receive a reminder letter in the mail two months in advance. If you don't receive a letter, please call our office to schedule the follow-up appointment.

## 2015-08-09 ENCOUNTER — Encounter: Payer: Self-pay | Admitting: Cardiovascular Disease

## 2015-08-11 ENCOUNTER — Other Ambulatory Visit: Payer: Self-pay | Admitting: Family Medicine

## 2015-08-14 ENCOUNTER — Other Ambulatory Visit: Payer: Self-pay | Admitting: Physician Assistant

## 2015-08-17 NOTE — Telephone Encounter (Signed)
Patient cleared for surgery. See office note from 08/08/15.

## 2015-08-19 ENCOUNTER — Other Ambulatory Visit: Payer: Self-pay | Admitting: Physician Assistant

## 2015-08-22 ENCOUNTER — Telehealth: Payer: Self-pay | Admitting: Cardiovascular Disease

## 2015-08-22 DIAGNOSIS — G8918 Other acute postprocedural pain: Secondary | ICD-10-CM | POA: Diagnosis not present

## 2015-08-22 DIAGNOSIS — M24111 Other articular cartilage disorders, right shoulder: Secondary | ICD-10-CM | POA: Diagnosis not present

## 2015-08-22 DIAGNOSIS — M94261 Chondromalacia, right knee: Secondary | ICD-10-CM | POA: Diagnosis not present

## 2015-08-22 DIAGNOSIS — M66821 Spontaneous rupture of other tendons, right upper arm: Secondary | ICD-10-CM | POA: Diagnosis not present

## 2015-08-22 DIAGNOSIS — M75111 Incomplete rotator cuff tear or rupture of right shoulder, not specified as traumatic: Secondary | ICD-10-CM | POA: Diagnosis not present

## 2015-08-22 DIAGNOSIS — S46111A Strain of muscle, fascia and tendon of long head of biceps, right arm, initial encounter: Secondary | ICD-10-CM | POA: Diagnosis not present

## 2015-08-22 NOTE — Telephone Encounter (Signed)
Spoke to [place] PA with [facility]. Reports patient is in recovery now from having surgery- During surgery -episode of afib - now in recovery - converted to sinus rythm   PA wanted to know if appointment is needed ,and anticoagulant. RN discussed with Dr Oval Linsey Per Dr Lucretia Field - START aspirin tomorrow, ELQUIS 5 mg bid on Saturday Appointment 08/27/15 at 9:45 am  [place] PA verbalized understanding

## 2015-08-22 NOTE — Telephone Encounter (Signed)
Kiersten from [employer].. Is calling because Mr. Dady was in surgery today and went into Afib while in surgery , he is now out and in the recovery. Please call  Thanks

## 2015-08-26 NOTE — Progress Notes (Signed)
Cardiology Office Note   Date:  08/26/2015   ID:  David Weaver, David Weaver 04-28-1951, MRN [id number]  PCP:  Reginia Forts, MD  Cardiologist:   Sharol Harness, MD   No chief complaint on file.     History of Present Illness: David Weaver is a 64 y.o. male with hypertension and lupus who presents for newly diagnosed atrial fibrillation.  David Weaver was seen in clinic on 08/07/15 for a presurgical assessment.  At that time he was cleared for shoulder surgery.  During surgery David Weaver went into atrial fibrillation with RVR.  He reportedly went back into sinus rhythm without intervention. He was started on Eliquis 5mg  daily and referred to follow up with cardiology.  Since his surgery he has been doing well. He denies any chest pain, palpitations, shortness of breath, lower extremity edema, orthopnea or PND. He does note some occasional lightheadedness that occurs when walking. This happens approximately once per week and last for a few seconds. He denies any syncope. He doesn't note any palpitations that occur at this time. This has been going on for approximately 6 months. His surgery was otherwise uncomplicated.    Past Medical History  Diagnosis Date  . Other abnormal glucose   . Allergic rhinitis, cause unspecified   . Osteoarthrosis, unspecified whether generalized or localized, lower leg   . Other specified disorder of male genital organs(608.89)   . Alcohol abuse, daily use   . Essential hypertension, benign   . Esophageal reflux   . Lupus erythematosus   . Unspecified hearing loss   . Insomnia, unspecified   . Unspecified vitamin D deficiency   . Left knee DJD   . Fibromyalgia     followed by [name] every six months.  . Dysthymic disorder   . Impotence of organic origin     Past Surgical History  Procedure Laterality Date  . Cholecystectomy    . Carpal tunnel release      bilateral  . Ganglion cyst excision  1980    Left wrist  . Knee arthroscopy  2008    Right    . Lumbar spine epidural  2009    [facility]  . Rotator cuff repair  10/2008    right  . Total knee arthroplasty  12/27/2012    Procedure: TOTAL KNEE ARTHROPLASTY;  Surgeon: [name], MD;  Location: [facility];  Service: Orthopedics;  Laterality: Right;  RIGHT ARTHROPLASTY KNEE MEDIAL AND LATERAL COMPARTMENTS WITH PATELLA RESURFACING, RIGHT TOTAL KNEE REPLACEMENT  . Total knee arthroplasty Left 01/31/2013    Procedure: TOTAL KNEE ARTHROPLASTY;  Surgeon: [name], MD;  Location: [facility];  Service: Orthopedics;  Laterality: Left;  . Joint replacement  01/2013    right knee, left knee. [facility].  . Colonoscopy  03/01/2014    two polyps; Dr. Benson Norway.  Repeat 5 years.     Current Outpatient Prescriptions  Medication Sig Dispense Refill  . amLODipine (NORVASC) 10 MG tablet TAKE 1 TABLET (10 MG TOTAL) BY MOUTH DAILY. 30 tablet 4  . aspirin 81 MG tablet Take 81 mg by mouth daily.    . Cyanocobalamin (VITAMIN B12 PO) Take by mouth daily.    . diclofenac sodium (VOLTAREN) 1 % GEL Apply 2-4 g topically 4 (four) times daily. 100 g 11  . DULoxetine (CYMBALTA) 60 MG capsule Take 1 capsule (60 mg total) by mouth 2 (two) times daily. 60 capsule 2  . hydroxychloroquine (PLAQUENIL) 200 MG tablet Take  200 mg by mouth 2 (two) times daily.    . meloxicam (MOBIC) 15 MG tablet TAKE 1 TABLET BY MOUTH EVERY DAY 30 tablet 3  . methocarbamol (ROBAXIN) 500 MG tablet TAKE 1 TO 2 TABLETS BY MOUTH EVERY 8 HOURS AS NEEDED FOR MUSCLEPASMS 60 tablet 0  . omeprazole (PRILOSEC) 20 MG capsule TAKE 1 CAPSULE (40 MG TOTAL) BY MOUTH DAILY. 30 capsule 11  . oxyCODONE (OXY IR/ROXICODONE) 5 MG immediate release tablet 1 tablet twice daily for pain (Patient taking differently: 1 tablet three times daily for pain) 60 tablet 0  . traZODone (DESYREL) 50 MG tablet TAKE 1 TO 2 TABLETS BY MOUTH EVERY DAY AT BEDTIME AS NEEDED FOR SLEEP 60 tablet 1  . Vitamin D, Ergocalciferol, (DRISDOL) 50000 UNITS CAPS capsule TAKE 1 CAPSULE  (50,000 UNITS TOTAL) BY MOUTH EVERY 7 (SEVEN) DAYS. DUE FOR VIT D LAB AT FOLLOW UP. 4 capsule 5   No current facility-administered medications for this visit.    Allergies:   Review of patient's allergies indicates no known allergies.    Social History:  The patient  reports that he quit smoking about 6 years ago. His smoking use included Cigarettes. He has a 60 pack-year smoking history. He has never used smokeless tobacco. He reports that he drinks about 6.0 oz of alcohol per week. He reports that he does not use illicit drugs.   Family History:  The patient's family history includes Alcohol abuse in his father; Alzheimer's disease in his mother; Cancer in his brother; Diabetes in his brother; Heart disease (age of onset: 71) in his father; Kidney failure in his brother; Osteoporosis in his mother.    ROS:  Please see the history of present illness.   Otherwise, review of systems are positive for shoulder pain.   All other systems are reviewed and negative.    PHYSICAL EXAM: VS:  There were no vitals taken for this visit. , BMI There is no weight on file to calculate BMI. GENERAL:  Well appearing HEENT:  Pupils equal round and reactive, fundi not visualized, oral mucosa unremarkable NECK:  No jugular venous distention, waveform within normal limits, carotid upstroke brisk and symmetric, no bruits, no thyromegaly LYMPHATICS:  No cervical adenopathy LUNGS:  Clear to auscultation bilaterally HEART:  RRR.  PMI not displaced or sustained,S1 and S2 within normal limits, no S3, no S4, no clicks, no rubs, no murmurs ABD:  Flat, positive bowel sounds normal in frequency in pitch, no bruits, no rebound, no guarding, no midline pulsatile mass, no hepatomegaly, no splenomegaly EXT:  2 plus pulses throughout, no edema, no cyanosis no clubbing SKIN:  No rashes no nodules NEURO:  Cranial nerves II through XII grossly intact, motor grossly intact throughout PSYCH:  Cognitively intact, oriented to  person place and time    EKG:  EKG is not ordered today.   8/15;16: Sinus rhythm 93 bpm.  Non-specific t wave changes. 04/05/14: Ectopic atrial rhythm. Non-specific inferior T wave changes.  Recent Labs: 10/11/2014: ALT 28; BUN 13; Creat 0.68; Hemoglobin 15.4; Platelets 271; Potassium 3.8; Sodium 139; TSH 2.458    Lipid Panel    Component Value Date/Time   CHOL 219* 04/03/2014 0921   TRIG 275* 04/03/2014 0921   HDL 60 04/03/2014 0921   CHOLHDL 3.7 04/03/2014 0921   VLDL 55* 04/03/2014 0921   LDLCALC 104* 04/03/2014 0921      Wt Readings from Last 3 Encounters:  08/08/15 79.833 kg (176 lb)  07/16/15 79.198 kg (174  lb 9.6 oz)  05/24/15 80.457 kg (177 lb 6 oz)      Other studies Reviewed: Additional studies/ records that were reviewed today include:. Review of the above records demonstrates:  Please see elsewhere in the note.     ASSESSMENT AND PLAN:  # Paroxysmal atrial fibrillation: David Weaver had an episode of transient atrial fibrillation during surgery.  He does not think that he has had any recurrent episodes since surgery.  He does reports some short episodes of lightheadedness but no syncope or palpitations.  - Continue apixaban x30 days - 7 day Event monitor on 10/10 to assess for occult atrial fibrillation and determine if there are any arrhythmias responsible for his exertional dizziness. - If no atrial fibrillation is detected, will discontinue Apixaban - Check transthoracic echo - Check basic metabolic panel, CBC, thyroid function, magnesium level.   # Hypertension: BP well-controlled.  Continue amlodipine.   Current medicines are reviewed at length with the patient today.  The patient does not have concerns regarding medicines.  The following changes have been made:  no change  Labs/ tests ordered today include: No orders of the defined types were placed in this encounter.     Disposition:   FU with Dr. Jonelle Sidle C. [place] prn.     Signed, Sharol Harness, MD  08/26/2015 11:40 PM    [place]

## 2015-08-27 ENCOUNTER — Encounter: Payer: Self-pay | Admitting: Cardiovascular Disease

## 2015-08-27 ENCOUNTER — Ambulatory Visit (INDEPENDENT_AMBULATORY_CARE_PROVIDER_SITE_OTHER): Payer: Medicare Other | Admitting: Cardiovascular Disease

## 2015-08-27 VITALS — BP 130/70 | HR 89 | Ht 69.0 in | Wt 189.0 lb

## 2015-08-27 DIAGNOSIS — M6281 Muscle weakness (generalized): Secondary | ICD-10-CM | POA: Diagnosis not present

## 2015-08-27 DIAGNOSIS — I4891 Unspecified atrial fibrillation: Secondary | ICD-10-CM

## 2015-08-27 DIAGNOSIS — M25611 Stiffness of right shoulder, not elsewhere classified: Secondary | ICD-10-CM | POA: Diagnosis not present

## 2015-08-27 DIAGNOSIS — I10 Essential (primary) hypertension: Secondary | ICD-10-CM | POA: Diagnosis not present

## 2015-08-27 LAB — CBC
HCT: 44.1 % (ref 39.0–52.0)
HEMOGLOBIN: 15 g/dL (ref 13.0–17.0)
MCH: 30.2 pg (ref 26.0–34.0)
MCHC: 34 g/dL (ref 30.0–36.0)
MCV: 88.7 fL (ref 78.0–100.0)
MPV: 10.1 fL (ref 8.6–12.4)
PLATELETS: 294 10*3/uL (ref 150–400)
RBC: 4.97 MIL/uL (ref 4.22–5.81)
RDW: 13.4 % (ref 11.5–15.5)
WBC: 5.9 10*3/uL (ref 4.0–10.5)

## 2015-08-27 LAB — BASIC METABOLIC PANEL
BUN: 9 mg/dL (ref 7–25)
CALCIUM: 9.2 mg/dL (ref 8.6–10.3)
CO2: 27 mmol/L (ref 20–31)
CREATININE: 0.61 mg/dL — AB (ref 0.70–1.25)
Chloride: 104 mmol/L (ref 98–110)
Glucose, Bld: 101 mg/dL — ABNORMAL HIGH (ref 65–99)
Potassium: 4 mmol/L (ref 3.5–5.3)
SODIUM: 137 mmol/L (ref 135–146)

## 2015-08-27 LAB — MAGNESIUM: MAGNESIUM: 1.8 mg/dL (ref 1.5–2.5)

## 2015-08-27 NOTE — Patient Instructions (Signed)
Medication Instructions:  NONE Please continue your current medications.  Labwork: Your physician recommends that you return for lab work TODAY.  Testing/Procedures: Your physician has requested that you have an echocardiogram. Echocardiography is a painless test that uses sound waves to create images of your heart. It provides your doctor with information about the size and shape of your heart and how well your heart's chambers and valves are working. This procedure takes approximately one hour. There are no restrictions for this procedure. This will be done at our [facility] location. The address is [address], [address]  Your physician has recommended that you wear an event monitor. Event monitors are medical devices that record the heart's electrical activity. Doctors most often [place] these monitors to diagnose arrhythmias. Arrhythmias are problems with the speed or rhythm of the heartbeat. The monitor is a small, portable device. You can wear one while you do your normal daily activities. This is usually used to diagnose what is causing palpitations/syncope (passing out). **Please schedule a nurse visit appointment on October 10th, 2016 to have this monitor placed.  Follow-Up: Dr Oval Linsey recommends that you schedule a follow-up appointment in 1 year. You will receive a reminder letter in the mail two months in advance. If you don't receive a letter, please call our office to schedule the follow-up appointment.  Any Other Special Instructions Will Be Listed Below (If Applicable).

## 2015-08-28 LAB — TSH: TSH: 1.808 u[IU]/mL (ref 0.350–4.500)

## 2015-08-28 LAB — T4, FREE: Free T4: 1.02 ng/dL (ref 0.80–1.80)

## 2015-08-30 ENCOUNTER — Telehealth: Payer: Self-pay | Admitting: *Deleted

## 2015-08-30 DIAGNOSIS — Z79891 Long term (current) use of opiate analgesic: Secondary | ICD-10-CM | POA: Diagnosis not present

## 2015-08-30 DIAGNOSIS — M150 Primary generalized (osteo)arthritis: Secondary | ICD-10-CM | POA: Diagnosis not present

## 2015-08-30 DIAGNOSIS — M47816 Spondylosis without myelopathy or radiculopathy, lumbar region: Secondary | ICD-10-CM | POA: Diagnosis not present

## 2015-08-30 DIAGNOSIS — M47812 Spondylosis without myelopathy or radiculopathy, cervical region: Secondary | ICD-10-CM | POA: Diagnosis not present

## 2015-08-30 DIAGNOSIS — S46111D Strain of muscle, fascia and tendon of long head of biceps, right arm, subsequent encounter: Secondary | ICD-10-CM | POA: Diagnosis not present

## 2015-08-30 NOTE — Telephone Encounter (Signed)
LEFT DETAIL MESSAGE ON ANSWER MACHINE  ANY QUESTION MAY CALL BACK

## 2015-08-30 NOTE — Telephone Encounter (Signed)
-----   Message from Skeet Latch, MD sent at 08/30/2015  9:20 AM EDT ----- Normal labs.

## 2015-08-31 DIAGNOSIS — M25611 Stiffness of right shoulder, not elsewhere classified: Secondary | ICD-10-CM | POA: Diagnosis not present

## 2015-08-31 DIAGNOSIS — M6281 Muscle weakness (generalized): Secondary | ICD-10-CM | POA: Diagnosis not present

## 2015-09-02 ENCOUNTER — Other Ambulatory Visit: Payer: Self-pay | Admitting: Family Medicine

## 2015-09-04 ENCOUNTER — Other Ambulatory Visit: Payer: Self-pay

## 2015-09-04 ENCOUNTER — Ambulatory Visit (INDEPENDENT_AMBULATORY_CARE_PROVIDER_SITE_OTHER): Payer: Medicare Other

## 2015-09-04 ENCOUNTER — Ambulatory Visit (HOSPITAL_COMMUNITY): Payer: Medicare Other | Attending: Cardiology

## 2015-09-04 DIAGNOSIS — Z87891 Personal history of nicotine dependence: Secondary | ICD-10-CM | POA: Insufficient documentation

## 2015-09-04 DIAGNOSIS — I10 Essential (primary) hypertension: Secondary | ICD-10-CM | POA: Diagnosis not present

## 2015-09-04 DIAGNOSIS — I77810 Thoracic aortic ectasia: Secondary | ICD-10-CM | POA: Insufficient documentation

## 2015-09-04 DIAGNOSIS — Z8249 Family history of ischemic heart disease and other diseases of the circulatory system: Secondary | ICD-10-CM | POA: Diagnosis not present

## 2015-09-04 DIAGNOSIS — I059 Rheumatic mitral valve disease, unspecified: Secondary | ICD-10-CM | POA: Insufficient documentation

## 2015-09-04 DIAGNOSIS — I4891 Unspecified atrial fibrillation: Secondary | ICD-10-CM

## 2015-09-04 DIAGNOSIS — M25611 Stiffness of right shoulder, not elsewhere classified: Secondary | ICD-10-CM | POA: Diagnosis not present

## 2015-09-04 DIAGNOSIS — M6281 Muscle weakness (generalized): Secondary | ICD-10-CM | POA: Diagnosis not present

## 2015-09-04 DIAGNOSIS — M329 Systemic lupus erythematosus, unspecified: Secondary | ICD-10-CM | POA: Diagnosis not present

## 2015-09-05 DIAGNOSIS — M6281 Muscle weakness (generalized): Secondary | ICD-10-CM | POA: Diagnosis not present

## 2015-09-05 DIAGNOSIS — M25611 Stiffness of right shoulder, not elsewhere classified: Secondary | ICD-10-CM | POA: Diagnosis not present

## 2015-09-08 ENCOUNTER — Other Ambulatory Visit: Payer: Self-pay | Admitting: Family Medicine

## 2015-09-10 ENCOUNTER — Other Ambulatory Visit: Payer: Self-pay | Admitting: Family Medicine

## 2015-09-12 DIAGNOSIS — M25611 Stiffness of right shoulder, not elsewhere classified: Secondary | ICD-10-CM | POA: Diagnosis not present

## 2015-09-12 DIAGNOSIS — M6281 Muscle weakness (generalized): Secondary | ICD-10-CM | POA: Diagnosis not present

## 2015-09-14 DIAGNOSIS — M25611 Stiffness of right shoulder, not elsewhere classified: Secondary | ICD-10-CM | POA: Diagnosis not present

## 2015-09-14 DIAGNOSIS — M6281 Muscle weakness (generalized): Secondary | ICD-10-CM | POA: Diagnosis not present

## 2015-09-17 DIAGNOSIS — M6281 Muscle weakness (generalized): Secondary | ICD-10-CM | POA: Diagnosis not present

## 2015-09-17 DIAGNOSIS — M25611 Stiffness of right shoulder, not elsewhere classified: Secondary | ICD-10-CM | POA: Diagnosis not present

## 2015-09-18 ENCOUNTER — Other Ambulatory Visit: Payer: Self-pay | Admitting: Family Medicine

## 2015-09-20 DIAGNOSIS — M6281 Muscle weakness (generalized): Secondary | ICD-10-CM | POA: Diagnosis not present

## 2015-09-20 DIAGNOSIS — M25611 Stiffness of right shoulder, not elsewhere classified: Secondary | ICD-10-CM | POA: Diagnosis not present

## 2015-09-24 DIAGNOSIS — M25611 Stiffness of right shoulder, not elsewhere classified: Secondary | ICD-10-CM | POA: Diagnosis not present

## 2015-09-24 DIAGNOSIS — M6281 Muscle weakness (generalized): Secondary | ICD-10-CM | POA: Diagnosis not present

## 2015-09-26 DIAGNOSIS — M25611 Stiffness of right shoulder, not elsewhere classified: Secondary | ICD-10-CM | POA: Diagnosis not present

## 2015-09-26 DIAGNOSIS — M6281 Muscle weakness (generalized): Secondary | ICD-10-CM | POA: Diagnosis not present

## 2015-09-27 DIAGNOSIS — S46111D Strain of muscle, fascia and tendon of long head of biceps, right arm, subsequent encounter: Secondary | ICD-10-CM | POA: Diagnosis not present

## 2015-09-28 DIAGNOSIS — M797 Fibromyalgia: Secondary | ICD-10-CM | POA: Diagnosis not present

## 2015-09-28 DIAGNOSIS — L930 Discoid lupus erythematosus: Secondary | ICD-10-CM | POA: Diagnosis not present

## 2015-09-28 DIAGNOSIS — Z79899 Other long term (current) drug therapy: Secondary | ICD-10-CM | POA: Diagnosis not present

## 2015-09-30 ENCOUNTER — Other Ambulatory Visit: Payer: Self-pay | Admitting: Family Medicine

## 2015-10-01 ENCOUNTER — Other Ambulatory Visit: Payer: Self-pay | Admitting: Cardiovascular Disease

## 2015-10-01 DIAGNOSIS — M6281 Muscle weakness (generalized): Secondary | ICD-10-CM | POA: Diagnosis not present

## 2015-10-01 DIAGNOSIS — M25611 Stiffness of right shoulder, not elsewhere classified: Secondary | ICD-10-CM | POA: Diagnosis not present

## 2015-10-01 MED ORDER — APIXABAN 2.5 MG PO TABS: 2.5000 mg | ORAL_TABLET | Freq: Two times a day (BID) | ORAL | Status: DC

## 2015-10-01 NOTE — Telephone Encounter (Signed)
°  STAT if patient is at the pharmacy , call can be transferred to refill team.   1. Which medications need to be refilled? Eliquis  5mg    2. Which pharmacy/location is medication to be sent to?[facility] on [address]   3. Do they need a 30 day or 90 day supply? [place]

## 2015-10-01 NOTE — Telephone Encounter (Signed)
Rx(s) sent to pharmacy electronically.  

## 2015-10-03 ENCOUNTER — Ambulatory Visit (INDEPENDENT_AMBULATORY_CARE_PROVIDER_SITE_OTHER): Payer: Medicare Other | Admitting: Family Medicine

## 2015-10-03 VITALS — BP 126/78 | HR 92 | Temp 98.0°F | Resp 16 | Ht 67.0 in | Wt 181.0 lb

## 2015-10-03 DIAGNOSIS — M25611 Stiffness of right shoulder, not elsewhere classified: Secondary | ICD-10-CM | POA: Diagnosis not present

## 2015-10-03 DIAGNOSIS — L930 Discoid lupus erythematosus: Secondary | ICD-10-CM

## 2015-10-03 DIAGNOSIS — I10 Essential (primary) hypertension: Secondary | ICD-10-CM | POA: Diagnosis not present

## 2015-10-03 DIAGNOSIS — F32A Depression, unspecified: Secondary | ICD-10-CM

## 2015-10-03 DIAGNOSIS — R5381 Other malaise: Secondary | ICD-10-CM | POA: Diagnosis not present

## 2015-10-03 DIAGNOSIS — R7309 Other abnormal glucose: Secondary | ICD-10-CM | POA: Diagnosis not present

## 2015-10-03 DIAGNOSIS — K219 Gastro-esophageal reflux disease without esophagitis: Secondary | ICD-10-CM | POA: Diagnosis not present

## 2015-10-03 DIAGNOSIS — G894 Chronic pain syndrome: Secondary | ICD-10-CM

## 2015-10-03 DIAGNOSIS — F418 Other specified anxiety disorders: Secondary | ICD-10-CM

## 2015-10-03 DIAGNOSIS — Z23 Encounter for immunization: Secondary | ICD-10-CM

## 2015-10-03 DIAGNOSIS — Z1159 Encounter for screening for other viral diseases: Secondary | ICD-10-CM

## 2015-10-03 DIAGNOSIS — F419 Anxiety disorder, unspecified: Secondary | ICD-10-CM

## 2015-10-03 DIAGNOSIS — I480 Paroxysmal atrial fibrillation: Secondary | ICD-10-CM

## 2015-10-03 DIAGNOSIS — E559 Vitamin D deficiency, unspecified: Secondary | ICD-10-CM | POA: Diagnosis not present

## 2015-10-03 DIAGNOSIS — F329 Major depressive disorder, single episode, unspecified: Secondary | ICD-10-CM

## 2015-10-03 DIAGNOSIS — R5383 Other fatigue: Secondary | ICD-10-CM

## 2015-10-03 DIAGNOSIS — M6281 Muscle weakness (generalized): Secondary | ICD-10-CM | POA: Diagnosis not present

## 2015-10-03 DIAGNOSIS — F341 Dysthymic disorder: Secondary | ICD-10-CM | POA: Diagnosis not present

## 2015-10-03 DIAGNOSIS — M797 Fibromyalgia: Secondary | ICD-10-CM

## 2015-10-03 LAB — POCT URINALYSIS DIP (MANUAL ENTRY)
Bilirubin, UA: NEGATIVE
Blood, UA: NEGATIVE
GLUCOSE UA: NEGATIVE
Ketones, POC UA: NEGATIVE
Leukocytes, UA: NEGATIVE
NITRITE UA: NEGATIVE
Protein Ur, POC: NEGATIVE
Spec Grav, UA: 1.025
Urobilinogen, UA: 0.2
pH, UA: 6

## 2015-10-03 NOTE — Progress Notes (Signed)
Subjective:    Patient ID: David Weaver, male    DOB: 18-Jan-1951, 64 y.o.   MRN: [id number]  10/03/2015  Flu Vaccine and Extremity Weakness   HPI This 64 y.o. male presents for evaluation of severe fatigue/malaise.  Out of work recovering from shoulder surgery.  Sitting around and doing  Nothing.  Doesn't feel well. No energy.  Onset when quit work 08/22/2015.   Shoulder is still sore despite surgery.  Goes back to [employer] on 10-18-15.  Restrictions; cannot work; cannot lift anything with R arm.   No fever/chills/sweats.  No headache.  No ear pain, sore throat, rhinorrhea, cough.  +nausea intermittently; no vomiting.  No diarrhea; no constipation.  No abdominal pain. No chest pain; +intermittent SOB with ambulation.  No leg swelling.  NO orthopnea.  No dysuria, urgency, frequency, worsening nocturia.  Emotionally OK; got emotional one day.  No SI.  Sleeping well most nights.  Ready to go back to work.  Things at home not great; stepdaughter and 2 grandchildren living with patient.  Not working out well; stepdaughter not trying to get out.   Cymbalta two daily; Trazodone at bedtime. Not drinking much; two beers at night.  No liquor.   Sitting a lot; no energy.   Exercising/walking treadmill or outside at park; 1 mile per day.   Not taking any  Narcotic pain medication at this time because not working.  No worsening joint pain or muscle pain.  With wrist fracture 01/2015 fell at work, rheumatology ordered bone density scan.  Has osteopenia.  No osteoporosis.  Recommended drinking more milk.  Recommended weight bearing exercises.    Needs flu shot.    Atrial fibrillation: developed during shoulder surgery; anesthesiology diagnosed and referred to cardiology; post-operatively cardiology recommended ASA and Eliquis 5mg  bid for 30 days.  Currently has event monitor per cardiology.  Echo with grade I diastolic dysfunction only; EF 55-60%.  No stress test.  No follow-up appointment.   R shoulder  rotator cuff surgical repair:  During surgery, went into atrial fibrillation.      Review of Systems  Constitutional: Positive for fatigue. Negative for fever, chills, diaphoresis, activity change and appetite change.  HENT: Negative for congestion, ear pain, postnasal drip, rhinorrhea and sore throat.   Respiratory: Negative for cough and shortness of breath.   Cardiovascular: Negative for chest pain, palpitations and leg swelling.  Gastrointestinal: Negative for nausea, vomiting, abdominal pain, diarrhea and abdominal distention.  Endocrine: Negative for cold intolerance, heat intolerance, polydipsia, polyphagia and polyuria.  Genitourinary: Negative for dysuria, urgency, frequency, hematuria, flank pain and genital sores.  Musculoskeletal: Negative for myalgias, arthralgias and neck pain.  Skin: Negative for color change, rash and wound.  Neurological: Negative for dizziness, tremors, seizures, syncope, facial asymmetry, speech difficulty, weakness, light-headedness, numbness and headaches.  Psychiatric/Behavioral: Negative for sleep disturbance and dysphoric mood. The patient is not nervous/anxious.     Past Medical History  Diagnosis Date  . Other abnormal glucose   . Allergic rhinitis, cause unspecified   . Osteoarthrosis, unspecified whether generalized or localized, lower leg   . Other specified disorder of male genital organs(608.89)   . Alcohol abuse, daily use   . Essential hypertension, benign   . Esophageal reflux   . Lupus erythematosus   . Unspecified hearing loss   . Insomnia, unspecified   . Unspecified vitamin D deficiency   . Left knee DJD   . Fibromyalgia     followed by [name] every six months.  [name]  Dysthymic disorder   . Impotence of organic origin    Past Surgical History  Procedure Laterality Date  . Cholecystectomy    . Carpal tunnel release      bilateral  . Ganglion cyst excision  1980    Left wrist  . Knee arthroscopy  2008    Right  . Lumbar  spine epidural  2009    [facility]  . Rotator cuff repair  10/2008    right  . Total knee arthroplasty  12/27/2012    Procedure: TOTAL KNEE ARTHROPLASTY;  Surgeon: [name], MD;  Location: [facility];  Service: Orthopedics;  Laterality: Right;  RIGHT ARTHROPLASTY KNEE MEDIAL AND LATERAL COMPARTMENTS WITH PATELLA RESURFACING, RIGHT TOTAL KNEE REPLACEMENT  . Total knee arthroplasty Left 01/31/2013    Procedure: TOTAL KNEE ARTHROPLASTY;  Surgeon: [name], MD;  Location: [facility];  Service: Orthopedics;  Laterality: Left;  . Joint replacement  01/2013    right knee, left knee. [facility].  . Colonoscopy  03/01/2014    two polyps; Dr. Benson Norway.  Repeat 5 years.   No Known Allergies Current Outpatient Prescriptions  Medication Sig Dispense Refill  . amLODipine (NORVASC) 10 MG tablet TAKE 1 TABLET (10 MG TOTAL) BY MOUTH DAILY. 30 tablet 4  . apixaban (ELIQUIS) 2.5 MG TABS tablet Take 1 tablet (2.5 mg total) by mouth 2 (two) times daily. 60 tablet 5  . Cyanocobalamin (VITAMIN B12 PO) Take by mouth daily.    . diclofenac sodium (VOLTAREN) 1 % GEL Apply 2-4 g topically 4 (four) times daily. 100 g 11  . DULoxetine (CYMBALTA) 60 MG capsule TAKE 1 CAPSULE (60 MG TOTAL) BY MOUTH 2 (TWO) TIMES DAILY. 60 capsule 3  . hydroxychloroquine (PLAQUENIL) 200 MG tablet Take 200 mg by mouth 2 (two) times daily.    . meloxicam (MOBIC) 15 MG tablet Take 1 tablet by mouth every day 30 tablet 1  . methocarbamol (ROBAXIN) 500 MG tablet TAKE 1 TO 2 TABLETS BY MOUTH EVERY 8 HOURS AS NEEDED FOR MUSCLEPASMS 60 tablet 0  . omeprazole (PRILOSEC) 20 MG capsule TAKE 1 CAPSULE (40 MG TOTAL) BY MOUTH DAILY. 30 capsule 11  . oxyCODONE (OXY IR/ROXICODONE) 5 MG immediate release tablet 1 tablet twice daily for pain (Patient taking differently: 1 tablet three times daily for pain) 60 tablet 0  . traZODone (DESYREL) 50 MG tablet TAKE 1 TO 2 TABLETS BY MOUTH EVERY DAY AT BEDTIME AS NEEDED FOR SLEEP 60 tablet 0  . Vitamin D,  Ergocalciferol, (DRISDOL) 50000 UNITS CAPS capsule TAKE 1 CAPSULE (50,000 UNITS TOTAL) BY MOUTH EVERY 7 (SEVEN) DAYS.  "NO MORE REFILLS DUE FOR VIT D LAB" 4 capsule 0  . aspirin 81 MG tablet Take 81 mg by mouth daily.     No current facility-administered medications for this visit.   Social History   Social History  . Marital Status: Married    Spouse Name: N/A  . Number of Children: 1  . Years of Education: N/A   Occupational History  . [employer]     [employer] cars[employer]   Social History Main Topics  . Smoking status: Former Smoker -- 2.00 packs/day for 30 years    Types: Cigarettes    Quit date: 12/28/2008  . Smokeless tobacco: Never Used     Comment: quit 12 /2011  . Alcohol Use: 6.0 oz/week    6 Cans of beer, 4 Standard drinks or equivalent per week     Comment: 2 drinks  of liquor, every afternoon    12/28/2012  wekends 4 5 beers  . Drug Use: No  . Sexual Activity: Yes   Other Topics Concern  . Not on file   Social History Narrative   Guns in the home stored in locked cabinet.       Caffeine use: 1 serving/ day.     Marital status:  Married x 18 years, third marriage, happily married.      Children: one son (4); one step-daughter; two grandsons      Lives: with wife, son.      Employment:  Works at 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      Tobacco: none currently; quit 2013.  Smoked x 40 years.      Alcohol:  1 pint per night in 08/9241; son is alcoholic.        Drugs: none      Exercise:  Gym membership in 2015; 2-3 nights per week.      Seatbelt: 100%   Family History  Problem Relation Age of Onset  . Alzheimer's disease Mother   . Osteoporosis Mother   . Heart disease Father 40    AMI  . Alcohol abuse Father   . Cancer Brother     colon cancer  . Diabetes Brother   . Kidney failure Brother        Objective:    BP 126/78 mmHg  Pulse 92  Temp(Src) 98 F (36.7 C) (Oral)  Resp 16  Ht 5\' 7"  (1.702 m)  Wt 181 lb  (82.101 kg)  BMI 28.34 kg/m2  SpO2 98% Physical Exam  Constitutional: He is oriented to person, place, and time. He appears well-developed and well-nourished. No distress.  HENT:  Head: Normocephalic and atraumatic.  Right Ear: External ear normal.  Left Ear: External ear normal.  Nose: Nose normal.  Mouth/Throat: Oropharynx is clear and moist.  Eyes: Conjunctivae and EOM are normal. Pupils are equal, round, and reactive to light.  Neck: Normal range of motion. Neck supple. Carotid bruit is not present. No thyromegaly present.  Cardiovascular: Normal rate, regular rhythm, normal heart sounds and intact distal pulses.  Exam reveals no gallop and no friction rub.   No murmur heard. Pulmonary/Chest: Effort normal and breath sounds normal. He has no wheezes. He has no rales.  Abdominal: Soft. Bowel sounds are normal. He exhibits no distension and no mass. There is no tenderness. There is no rebound and no guarding.  Lymphadenopathy:    He has no cervical adenopathy.  Neurological: He is alert and oriented to person, place, and time. No cranial nerve deficit. He exhibits normal muscle tone. Coordination normal.  Skin: Skin is warm and dry. No rash noted. He is not diaphoretic.  Psychiatric: He has a normal mood and affect. His behavior is normal. Judgment and thought content normal.  Nursing note and vitals reviewed.  Results for orders placed or performed in visit on 10/03/15  POCT urinalysis dipstick  Result Value Ref Range   Color, UA yellow yellow   Clarity, UA clear clear   Glucose, UA negative negative   Bilirubin, UA negative negative   Ketones, POC UA negative negative   Spec Grav, UA 1.025    Blood, UA negative negative   pH, UA 6.0    Protein Ur, POC negative negative   Urobilinogen, UA 0.2    Nitrite, UA Negative Negative   Leukocytes, UA Negative Negative   INFLUENZA  VACCINE ADMINISTERED.    Assessment & Plan:   1. Malaise and fatigue   2. Other abnormal glucose     3. Need for prophylactic vaccination and inoculation against influenza   4. Lupus erythematosus   5. Essential hypertension, benign   6. Gastroesophageal reflux disease without esophagitis   7. Chronic pain syndrome   8. Anxiety and depression   9. Fibromyalgia   10. Need for hepatitis C screening test   11. Vitamin D deficiency    -New malaise; obtain labs; likely consistent with decreased activity in post-operative period.   -Underlying anxiety and depression likely contributing to fatigue considering home stressors; scheduled to return to work in two weeks which will likely improve fatigue.  Sleeping well; avoiding excessive alcohol.  Exercising daily. -Currently undergoing event monitor for new onset paroxysmal atrial fibrillation; to follow-up with cardiology afterwards. -has decreased narcotic use since not working due to recent rotator cuff repair. -s/p flu vaccine.   Orders Placed This Encounter  Procedures  . Flu Vaccine QUAD 36+ mos IM  . CBC with Differential/Platelet  . Comprehensive metabolic panel    Order Specific Question:  Has the patient fasted?    Answer:  Yes  . TSH  . Hepatitis C antibody  . Hemoglobin A1c  . POCT urinalysis dipstick   No orders of the defined types were placed in this encounter.    Return in about 4 months (around 01/31/2016) for recheck.    Meghan Tiemann Elayne Guerin, M.D. [facility] [address] [place], [place]  [address] [phone] phone [phone] fax

## 2015-10-03 NOTE — Patient Instructions (Signed)
Fatigue  Fatigue is feeling tired all of the time, a lack of energy, or a lack of motivation. Occasional or mild fatigue is often a normal response to activity or life in general. However, long-lasting (chronic) or extreme fatigue may indicate an underlying medical condition.  HOME CARE INSTRUCTIONS   Watch your fatigue for any changes. The following actions may help to lessen any discomfort you are feeling:  · Talk to your health care provider about how much sleep you need each night. Try to get the required amount every night.  · Take medicines only as directed by your health care provider.  · Eat a healthy and nutritious diet. Ask your health care provider if you need help changing your diet.  · Drink enough fluid to keep your urine clear or pale yellow.  · Practice ways of relaxing, such as yoga, meditation, massage therapy, or acupuncture.  · Exercise regularly.    · Change situations that cause you stress. Try to keep your work and personal routine reasonable.  · Do not abuse illegal drugs.  · Limit alcohol intake to no more than 1 drink per day for nonpregnant women and 2 drinks per day for men. One drink equals 12 ounces of beer, 5 ounces of wine, or 1½ ounces of hard liquor.  · Take a multivitamin, if directed by your health care provider.  SEEK MEDICAL CARE IF:   · Your fatigue does not get better.  · You have a fever.    · You have unintentional weight loss or gain.  · You have headaches.    · You have difficulty:      Falling asleep.    Sleeping throughout the night.  · You feel angry, guilty, anxious, or sad.     · You are unable to have a bowel movement (constipation).    · You skin is dry.     · Your legs or another part of your body is swollen.    SEEK IMMEDIATE MEDICAL CARE IF:   · You feel confused.    · Your vision is blurry.  · You feel faint or pass out.    · You have a severe headache.    · You have severe abdominal, pelvic, or back pain.    · You have chest pain, shortness of breath, or an  irregular or fast heartbeat.    · You are unable to urinate or you urinate less than normal.    · You develop abnormal bleeding, such as bleeding from the rectum, vagina, nose, lungs, or nipples.  · You vomit blood.     · You have thoughts about harming yourself or committing suicide.    · You are worried that you might harm someone else.       This information is not intended to replace advice given to you by your health care provider. Make sure you discuss any questions you have with your health care provider.     Document Released: 09/14/2007 Document Revised: 12/08/2014 Document Reviewed: 03/21/2014  Elsevier Interactive Patient Education ©2016 [employer]

## 2015-10-04 LAB — CBC WITH DIFFERENTIAL/PLATELET
BASOS ABS: 0.1 10*3/uL (ref 0.0–0.1)
Basophils Relative: 1 % (ref 0–1)
Eosinophils Absolute: 0.2 10*3/uL (ref 0.0–0.7)
Eosinophils Relative: 4 % (ref 0–5)
HEMATOCRIT: 45.7 % (ref 39.0–52.0)
HEMOGLOBIN: 15.7 g/dL (ref 13.0–17.0)
LYMPHS ABS: 1.5 10*3/uL (ref 0.7–4.0)
LYMPHS PCT: 25 % (ref 12–46)
MCH: 30.3 pg (ref 26.0–34.0)
MCHC: 34.4 g/dL (ref 30.0–36.0)
MCV: 88.1 fL (ref 78.0–100.0)
MONOS PCT: 9 % (ref 3–12)
MPV: 10 fL (ref 8.6–12.4)
Monocytes Absolute: 0.5 10*3/uL (ref 0.1–1.0)
NEUTROS ABS: 3.5 10*3/uL (ref 1.7–7.7)
NEUTROS PCT: 61 % (ref 43–77)
Platelets: 262 10*3/uL (ref 150–400)
RBC: 5.19 MIL/uL (ref 4.22–5.81)
RDW: 13.3 % (ref 11.5–15.5)
WBC: 5.8 10*3/uL (ref 4.0–10.5)

## 2015-10-04 LAB — COMPREHENSIVE METABOLIC PANEL
ALBUMIN: 4.3 g/dL (ref 3.6–5.1)
ALT: 26 U/L (ref 9–46)
AST: 16 U/L (ref 10–35)
Alkaline Phosphatase: 65 U/L (ref 40–115)
BUN: 11 mg/dL (ref 7–25)
CALCIUM: 9.4 mg/dL (ref 8.6–10.3)
CHLORIDE: 103 mmol/L (ref 98–110)
CO2: 22 mmol/L (ref 20–31)
Creat: 0.64 mg/dL — ABNORMAL LOW (ref 0.70–1.25)
Glucose, Bld: 87 mg/dL (ref 65–99)
POTASSIUM: 3.7 mmol/L (ref 3.5–5.3)
Sodium: 137 mmol/L (ref 135–146)
TOTAL PROTEIN: 6.6 g/dL (ref 6.1–8.1)
Total Bilirubin: 0.5 mg/dL (ref 0.2–1.2)

## 2015-10-04 LAB — TSH: TSH: 4.38 u[IU]/mL (ref 0.350–4.500)

## 2015-10-04 LAB — HEPATITIS C ANTIBODY: HCV Ab: NEGATIVE

## 2015-10-04 LAB — HEMOGLOBIN A1C
HEMOGLOBIN A1C: 6.1 % — AB (ref <5.7)
MEAN PLASMA GLUCOSE: 128 mg/dL — AB (ref <117)

## 2015-10-06 ENCOUNTER — Other Ambulatory Visit: Payer: Self-pay | Admitting: Family Medicine

## 2015-10-09 DIAGNOSIS — Z79899 Other long term (current) drug therapy: Secondary | ICD-10-CM | POA: Diagnosis not present

## 2015-10-09 DIAGNOSIS — E559 Vitamin D deficiency, unspecified: Secondary | ICD-10-CM | POA: Diagnosis not present

## 2015-10-10 DIAGNOSIS — M6281 Muscle weakness (generalized): Secondary | ICD-10-CM | POA: Diagnosis not present

## 2015-10-10 DIAGNOSIS — M25611 Stiffness of right shoulder, not elsewhere classified: Secondary | ICD-10-CM | POA: Diagnosis not present

## 2015-10-12 ENCOUNTER — Telehealth: Payer: Self-pay | Admitting: Cardiovascular Disease

## 2015-10-12 DIAGNOSIS — M25611 Stiffness of right shoulder, not elsewhere classified: Secondary | ICD-10-CM | POA: Diagnosis not present

## 2015-10-12 DIAGNOSIS — M6281 Muscle weakness (generalized): Secondary | ICD-10-CM | POA: Diagnosis not present

## 2015-10-12 MED ORDER — APIXABAN 2.5 MG PO TABS: 2.5000 mg | ORAL_TABLET | Freq: Two times a day (BID) | ORAL | Status: DC

## 2015-10-12 NOTE — Telephone Encounter (Signed)
Spoke with patient's wife. Rx for Eliquis is too costly. They would like to look into patient assistance program before possibly changing to a new medication. Informed her that warfarin is the only generic medication comparable to Eliquis at this time and that Erasmo Downer our pharmacist would assist with any transition regarding anticoagulants.   Patient assistance paperwork will be left for patient's wife to pick up.   Patient/wife notified of event monitor results.

## 2015-10-12 NOTE — Telephone Encounter (Signed)
Pt would like to take something else in the place of Eliquis. It is too expensive.

## 2015-10-13 ENCOUNTER — Other Ambulatory Visit: Payer: Self-pay | Admitting: Physician Assistant

## 2015-10-15 DIAGNOSIS — M6281 Muscle weakness (generalized): Secondary | ICD-10-CM | POA: Diagnosis not present

## 2015-10-15 DIAGNOSIS — M25611 Stiffness of right shoulder, not elsewhere classified: Secondary | ICD-10-CM | POA: Diagnosis not present

## 2015-10-17 DIAGNOSIS — M25611 Stiffness of right shoulder, not elsewhere classified: Secondary | ICD-10-CM | POA: Diagnosis not present

## 2015-10-17 DIAGNOSIS — M6281 Muscle weakness (generalized): Secondary | ICD-10-CM | POA: Diagnosis not present

## 2015-10-18 ENCOUNTER — Telehealth: Payer: Self-pay | Admitting: *Deleted

## 2015-10-18 DIAGNOSIS — M25611 Stiffness of right shoulder, not elsewhere classified: Secondary | ICD-10-CM | POA: Diagnosis not present

## 2015-10-18 NOTE — Telephone Encounter (Signed)
Spoke to patient. Result given . Verbalized understanding APPT RECALL 6 MONTHS

## 2015-10-18 NOTE — Telephone Encounter (Signed)
-----   Message from Skeet Latch, MD sent at 10/17/2015  8:06 AM EST ----- Monitor showed more atrial fibrillation.  Continue Eliquis.  Thyroid function, kidney function, electrolytes, and blood counts are normal.  Follow up in 6 months.

## 2015-10-23 DIAGNOSIS — M25611 Stiffness of right shoulder, not elsewhere classified: Secondary | ICD-10-CM | POA: Diagnosis not present

## 2015-10-23 DIAGNOSIS — M6281 Muscle weakness (generalized): Secondary | ICD-10-CM | POA: Diagnosis not present

## 2015-10-29 DIAGNOSIS — M47816 Spondylosis without myelopathy or radiculopathy, lumbar region: Secondary | ICD-10-CM | POA: Diagnosis not present

## 2015-10-29 DIAGNOSIS — M150 Primary generalized (osteo)arthritis: Secondary | ICD-10-CM | POA: Diagnosis not present

## 2015-10-29 DIAGNOSIS — M47812 Spondylosis without myelopathy or radiculopathy, cervical region: Secondary | ICD-10-CM | POA: Diagnosis not present

## 2015-10-29 DIAGNOSIS — Z79891 Long term (current) use of opiate analgesic: Secondary | ICD-10-CM | POA: Diagnosis not present

## 2015-11-05 ENCOUNTER — Ambulatory Visit: Payer: Medicare Other | Admitting: Family Medicine

## 2015-11-06 ENCOUNTER — Telehealth: Payer: Self-pay | Admitting: Cardiovascular Disease

## 2015-11-06 NOTE — Telephone Encounter (Signed)
Spoke with wife and patient was told to d/c Meloxicam about a week ago, patient takes Eliquis 2.5 mg twice a day  Since he has d/c his hands are hurting and bothering him all the time  Has used Voltaren gel in past but does not seem to work as well as Meloxicam  Patient is taking Oxycodone Acetaminophen 5/325 mg so he does not use Tylenol  Will forward to Dr Oval Linsey for review

## 2015-11-06 NOTE — Telephone Encounter (Signed)
Pt was taken off Meloxicam. His hands are killing him,what else can he take?

## 2015-11-07 ENCOUNTER — Other Ambulatory Visit: Payer: Self-pay | Admitting: Family Medicine

## 2015-11-07 DIAGNOSIS — L932 Other local lupus erythematosus: Secondary | ICD-10-CM | POA: Diagnosis not present

## 2015-11-07 NOTE — Telephone Encounter (Signed)
Taking Meloxicam with Eliquis (or any blood thinner) raises the risk of bleeding.  There are not a lot of great options that I am aware of if he is already taking percocet.  He can have up to 4g (4000mg ) of Tylenol daily.  I suspect he is not getting that much with the percocet.  Try taking 650 mg of Tylenol three times daily (as long as that doesn't put him over the 4000 mg/day limit.  If that does not work, please follow up with PCP for pain management.

## 2015-11-08 NOTE — Telephone Encounter (Signed)
Spoke to wife. Information given Patient's wife states patient is in pain management ,now  Patient has bought some cream over the counter-that is helping RN instructed wife to check for the main ingredients - be careful of use with Eliquis as well She verbalized understanding.

## 2015-11-10 ENCOUNTER — Other Ambulatory Visit: Payer: Self-pay | Admitting: Family Medicine

## 2015-11-16 ENCOUNTER — Other Ambulatory Visit: Payer: Self-pay | Admitting: Family Medicine

## 2015-11-18 NOTE — Telephone Encounter (Signed)
Ok to fill trazodone?

## 2015-11-26 ENCOUNTER — Other Ambulatory Visit: Payer: Self-pay | Admitting: Family Medicine

## 2015-12-01 ENCOUNTER — Other Ambulatory Visit: Payer: Self-pay | Admitting: Family Medicine

## 2015-12-17 DIAGNOSIS — S46012A Strain of muscle(s) and tendon(s) of the rotator cuff of left shoulder, initial encounter: Secondary | ICD-10-CM | POA: Diagnosis not present

## 2015-12-17 DIAGNOSIS — S46011A Strain of muscle(s) and tendon(s) of the rotator cuff of right shoulder, initial encounter: Secondary | ICD-10-CM | POA: Diagnosis not present

## 2016-01-02 ENCOUNTER — Other Ambulatory Visit: Payer: Self-pay | Admitting: Family Medicine

## 2016-01-04 NOTE — Telephone Encounter (Signed)
Dr [name], do you want to RF? You have Rxd for pt several times in the past, but never put RFs on the Rxs.

## 2016-01-08 DIAGNOSIS — M150 Primary generalized (osteo)arthritis: Secondary | ICD-10-CM | POA: Diagnosis not present

## 2016-01-08 DIAGNOSIS — M47812 Spondylosis without myelopathy or radiculopathy, cervical region: Secondary | ICD-10-CM | POA: Diagnosis not present

## 2016-01-08 DIAGNOSIS — Z79891 Long term (current) use of opiate analgesic: Secondary | ICD-10-CM | POA: Diagnosis not present

## 2016-01-08 DIAGNOSIS — M47816 Spondylosis without myelopathy or radiculopathy, lumbar region: Secondary | ICD-10-CM | POA: Diagnosis not present

## 2016-01-14 ENCOUNTER — Other Ambulatory Visit: Payer: Self-pay | Admitting: Family Medicine

## 2016-02-03 ENCOUNTER — Other Ambulatory Visit: Payer: Self-pay | Admitting: Family Medicine

## 2016-02-04 ENCOUNTER — Ambulatory Visit: Payer: Medicare Other | Admitting: Family Medicine

## 2016-02-12 DIAGNOSIS — M797 Fibromyalgia: Secondary | ICD-10-CM | POA: Diagnosis not present

## 2016-02-12 DIAGNOSIS — M7072 Other bursitis of hip, left hip: Secondary | ICD-10-CM | POA: Diagnosis not present

## 2016-02-12 DIAGNOSIS — Z79899 Other long term (current) drug therapy: Secondary | ICD-10-CM | POA: Diagnosis not present

## 2016-02-12 DIAGNOSIS — G4709 Other insomnia: Secondary | ICD-10-CM | POA: Diagnosis not present

## 2016-02-12 DIAGNOSIS — Z09 Encounter for follow-up examination after completed treatment for conditions other than malignant neoplasm: Secondary | ICD-10-CM | POA: Diagnosis not present

## 2016-02-16 ENCOUNTER — Other Ambulatory Visit: Payer: Self-pay | Admitting: Family Medicine

## 2016-02-18 DIAGNOSIS — S46011D Strain of muscle(s) and tendon(s) of the rotator cuff of right shoulder, subsequent encounter: Secondary | ICD-10-CM | POA: Diagnosis not present

## 2016-02-18 DIAGNOSIS — S46012D Strain of muscle(s) and tendon(s) of the rotator cuff of left shoulder, subsequent encounter: Secondary | ICD-10-CM | POA: Diagnosis not present

## 2016-02-19 ENCOUNTER — Encounter: Payer: Self-pay | Admitting: Family Medicine

## 2016-02-19 ENCOUNTER — Ambulatory Visit (INDEPENDENT_AMBULATORY_CARE_PROVIDER_SITE_OTHER): Payer: Medicare Other | Admitting: Family Medicine

## 2016-02-19 VITALS — BP 169/80 | HR 106 | Temp 97.8°F | Resp 16 | Ht 66.0 in | Wt 179.6 lb

## 2016-02-19 DIAGNOSIS — R7309 Other abnormal glucose: Secondary | ICD-10-CM | POA: Diagnosis not present

## 2016-02-19 DIAGNOSIS — F32A Depression, unspecified: Secondary | ICD-10-CM

## 2016-02-19 DIAGNOSIS — E559 Vitamin D deficiency, unspecified: Secondary | ICD-10-CM | POA: Diagnosis not present

## 2016-02-19 DIAGNOSIS — K591 Functional diarrhea: Secondary | ICD-10-CM

## 2016-02-19 DIAGNOSIS — E785 Hyperlipidemia, unspecified: Secondary | ICD-10-CM | POA: Diagnosis not present

## 2016-02-19 DIAGNOSIS — G894 Chronic pain syndrome: Secondary | ICD-10-CM | POA: Diagnosis not present

## 2016-02-19 DIAGNOSIS — I480 Paroxysmal atrial fibrillation: Secondary | ICD-10-CM

## 2016-02-19 DIAGNOSIS — R5383 Other fatigue: Secondary | ICD-10-CM

## 2016-02-19 DIAGNOSIS — I10 Essential (primary) hypertension: Secondary | ICD-10-CM

## 2016-02-19 DIAGNOSIS — Z23 Encounter for immunization: Secondary | ICD-10-CM

## 2016-02-19 DIAGNOSIS — F329 Major depressive disorder, single episode, unspecified: Secondary | ICD-10-CM

## 2016-02-19 DIAGNOSIS — R3915 Urgency of urination: Secondary | ICD-10-CM | POA: Diagnosis not present

## 2016-02-19 LAB — CBC WITH DIFFERENTIAL/PLATELET
Basophils Absolute: 0 10*3/uL (ref 0.0–0.1)
Basophils Relative: 0 % (ref 0–1)
EOS PCT: 0 % (ref 0–5)
Eosinophils Absolute: 0 10*3/uL (ref 0.0–0.7)
HEMATOCRIT: 44.8 % (ref 39.0–52.0)
HEMOGLOBIN: 15.4 g/dL (ref 13.0–17.0)
LYMPHS ABS: 0.5 10*3/uL — AB (ref 0.7–4.0)
LYMPHS PCT: 6 % — AB (ref 12–46)
MCH: 29.4 pg (ref 26.0–34.0)
MCHC: 34.4 g/dL (ref 30.0–36.0)
MCV: 85.5 fL (ref 78.0–100.0)
MONO ABS: 0.3 10*3/uL (ref 0.1–1.0)
MONOS PCT: 3 % (ref 3–12)
MPV: 9.4 fL (ref 8.6–12.4)
NEUTROS ABS: 8.1 10*3/uL — AB (ref 1.7–7.7)
Neutrophils Relative %: 91 % — ABNORMAL HIGH (ref 43–77)
Platelets: 357 10*3/uL (ref 150–400)
RBC: 5.24 MIL/uL (ref 4.22–5.81)
RDW: 13.8 % (ref 11.5–15.5)
WBC: 8.9 10*3/uL (ref 4.0–10.5)

## 2016-02-19 LAB — POCT URINALYSIS DIP (MANUAL ENTRY)
BILIRUBIN UA: NEGATIVE
BILIRUBIN UA: NEGATIVE
Blood, UA: NEGATIVE
Glucose, UA: NEGATIVE
LEUKOCYTES UA: NEGATIVE
NITRITE UA: NEGATIVE
PH UA: 5.5
PROTEIN UA: NEGATIVE
Spec Grav, UA: >=1.03
Urobilinogen, UA: 0.2

## 2016-02-19 LAB — POC MICROSCOPIC URINALYSIS (UMFC): Mucus: ABSENT

## 2016-02-19 LAB — COMPREHENSIVE METABOLIC PANEL
ALK PHOS: 72 U/L (ref 40–115)
ALT: 20 U/L (ref 9–46)
AST: 12 U/L (ref 10–35)
Albumin: 4.3 g/dL (ref 3.6–5.1)
BILIRUBIN TOTAL: 0.5 mg/dL (ref 0.2–1.2)
BUN: 17 mg/dL (ref 7–25)
CO2: 21 mmol/L (ref 20–31)
Calcium: 9.5 mg/dL (ref 8.6–10.3)
Chloride: 102 mmol/L (ref 98–110)
Creat: 0.71 mg/dL (ref 0.70–1.25)
GLUCOSE: 172 mg/dL — AB (ref 65–99)
Potassium: 4.1 mmol/L (ref 3.5–5.3)
Sodium: 136 mmol/L (ref 135–146)
Total Protein: 6.7 g/dL (ref 6.1–8.1)

## 2016-02-19 LAB — LIPID PANEL
Cholesterol: 223 mg/dL — ABNORMAL HIGH (ref 125–200)
HDL: 65 mg/dL (ref >=40)
LDL CALC: 114 mg/dL (ref <130)
Total CHOL/HDL Ratio: 3.4 Ratio (ref <=5.0)
Triglycerides: 220 mg/dL — ABNORMAL HIGH (ref <150)
VLDL: 44 mg/dL — AB (ref <30)

## 2016-02-19 MED ORDER — CIPROFLOXACIN HCL 500 MG PO TABS: 500.0000 mg | ORAL_TABLET | Freq: Two times a day (BID) | ORAL | Status: DC

## 2016-02-19 MED ORDER — METOPROLOL SUCCINATE ER 25 MG PO TB24: 25.0000 mg | ORAL_TABLET | Freq: Every day | ORAL | Status: DC

## 2016-02-19 NOTE — Patient Instructions (Signed)
     IF you received an x-ray today, you will receive an invoice from [facility]. Please contact [facility] at [phone] with questions or concerns regarding your invoice.   IF you received labwork today, you will receive an invoice from [facility]. Please contact [facility] at [phone] with questions or concerns regarding your invoice.   Our billing staff will not be able to assist you with questions regarding bills from these companies.  You will be contacted with the lab results as soon as they are available. The fastest way to get your results is to activate your My Chart account. Instructions are located on the last page of this paperwork. If you have not heard from us regarding the results in 2 weeks, please contact this office.      

## 2016-02-19 NOTE — Progress Notes (Signed)
Subjective:    Patient ID: David Weaver, male    DOB: Jan 05, 1951, 65 y.o.   MRN: [id number]  02/19/2016  Medication Refill   HPI This 65 y.o. male presents for four month follow-up:  1. HTN: Patient reports good compliance with medication, good tolerance to medication, and good symptom control.  Checking at home; BP running 160/94.  Lowest reading is 145/80s.    2.  Glucose intolerance: weight down 2 pounds.  Eating yogurt two pounds.  Not going to the gym; has not returned to the gym since shoulder surgery.    3.  PAF: Patient reports good compliance with medication, good tolerance to medication, and good symptom control. Taking Eliquis; no recent follow-up with cardiology.   6.  Fibromyalgia: last saw [name].   Every six months.  7.  Depression/anxiety: Patient reports good compliance with medication, good tolerance to medication, and good symptom control.  Stepdaughter moved out finally.  Daughter finally got condo.  Wife was really stressed out with family living with her.  Takes Trazodone 2-3 at bedtime for sleep.    8. Fatigue: working a little bit; working 3 days per week now; 5-8 hours per day.  Wife wants patient to work longer.     9.  S/p R shoulder surgery: no improvement in pain in R shoulder.  L shoulder s/p two injections yesterday; to undergo MRI this week.    10. Diarrhea: every morning for a very long time; might be beer at nighttime; no bloody stools or black stools; urgency to diarrhea; has had an accident.  11. Urinary urgency: onset one month ago.  No dysuria; nocturia x 1.  No leaking but will if does not gt bathroom.   Review of Systems  Constitutional: Negative for fever, chills, diaphoresis, activity change, appetite change and fatigue.  Respiratory: Negative for cough and shortness of breath.   Cardiovascular: Negative for chest pain, palpitations and leg swelling.  Gastrointestinal: Positive for diarrhea. Negative for nausea, vomiting, abdominal  pain, blood in stool, abdominal distention, anal bleeding and rectal pain.  Endocrine: Negative for cold intolerance, heat intolerance, polydipsia, polyphagia and polyuria.  Genitourinary: Positive for urgency. Negative for dysuria, frequency, hematuria, decreased urine volume, discharge, penile swelling, scrotal swelling, difficulty urinating, genital sores, penile pain and testicular pain.  Musculoskeletal: Positive for arthralgias.  Skin: Negative for color change, rash and wound.  Neurological: Negative for dizziness, tremors, seizures, syncope, facial asymmetry, speech difficulty, weakness, light-headedness, numbness and headaches.  Psychiatric/Behavioral: Negative for sleep disturbance and dysphoric mood. The patient is not nervous/anxious.     Past Medical History  Diagnosis Date  . Other abnormal glucose   . Allergic rhinitis, cause unspecified   . Osteoarthrosis, unspecified whether generalized or localized, lower leg   . Other specified disorder of male genital organs(608.89)   . Alcohol abuse, daily use   . Essential hypertension, benign   . Esophageal reflux   . Lupus erythematosus   . Unspecified hearing loss   . Insomnia, unspecified   . Unspecified vitamin D deficiency   . Left knee DJD   . Fibromyalgia     followed by [name] every six months.  . Dysthymic disorder   . Impotence of organic origin    Past Surgical History  Procedure Laterality Date  . Cholecystectomy    . Carpal tunnel release      bilateral  . Ganglion cyst excision  1980    Left wrist  . Knee arthroscopy  2008  Right  . Lumbar spine epidural  2009    [facility]  . Rotator cuff repair  10/2008    right  . Total knee arthroplasty  12/27/2012    Procedure: TOTAL KNEE ARTHROPLASTY;  Surgeon: [name], MD;  Location: [facility];  Service: Orthopedics;  Laterality: Right;  RIGHT ARTHROPLASTY KNEE MEDIAL AND LATERAL COMPARTMENTS WITH PATELLA RESURFACING, RIGHT TOTAL KNEE REPLACEMENT   . Total knee arthroplasty Left 01/31/2013    Procedure: TOTAL KNEE ARTHROPLASTY;  Surgeon: [name], MD;  Location: [facility];  Service: Orthopedics;  Laterality: Left;  . Joint replacement  01/2013    right knee, left knee. [place].  . Colonoscopy  03/01/2014    two polyps; Dr. Benson Norway.  Repeat 5 years.   No Known Allergies  Social History   Social History  . Marital Status: Married    Spouse Name: N/A  . Number of Children: 1  . Years of Education: N/A   Occupational History  . [employer]     Paints cars/trucks   Social History Main Topics  . Smoking status: Former Smoker -- 2.00 packs/day for 30 years    Types: Cigarettes    Quit date: 12/28/2008  . Smokeless tobacco: Never Used     Comment: quit 12 /2011  . Alcohol Use: 6.0 oz/week    6 Cans of beer, 4 Standard drinks or equivalent per week     Comment: 2 drinks of liquor, every afternoon    12/28/2012  wekends 4 5 beers  . Drug Use: No  . Sexual Activity: Yes   Other Topics Concern  . Not on file   Social History Narrative   Guns in the home stored in locked cabinet.       Caffeine use: 1 serving/ day.     Marital status:  Married x 18 years, third marriage, happily married.      Children: one son (66); one step-daughter; two grandsons      Lives: with wife, son.      Employment:  Works at 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      Tobacco: none currently; quit 2013.  Smoked x 40 years.      Alcohol:  1 pint per night in [id number]; son is alcoholic.        Drugs: none      Exercise:  Gym membership in 2015; 2-3 nights per week.      Seatbelt: 100%   Family History  Problem Relation Age of Onset  . Alzheimer's disease Mother   . Osteoporosis Mother   . Heart disease Father 70    AMI  . Alcohol abuse Father   . Cancer Brother     colon cancer  . Diabetes Brother   . Kidney failure Brother        Objective:    BP 169/80 mmHg  Pulse 106  Temp(Src) 97.8 F (36.6 C) (Oral)   Resp 16  Ht 5\' 6"  (1.676 m)  Wt 179 lb 9.6 oz (81.466 kg)  BMI 29.00 kg/m2 Physical Exam  Constitutional: He is oriented to person, place, and time. He appears well-developed and well-nourished. No distress.  HENT:  Head: Normocephalic and atraumatic.  Right Ear: External ear normal.  Left Ear: External ear normal.  Nose: Nose normal.  Mouth/Throat: Oropharynx is clear and moist.  Eyes: Conjunctivae and EOM are normal. Pupils are equal, round, and reactive to light.  Neck: Normal  range of motion. Neck supple. Carotid bruit is not present. No thyromegaly present.  Cardiovascular: Normal rate, regular rhythm, normal heart sounds and intact distal pulses.  Exam reveals no gallop and no friction rub.   No murmur heard. Pulmonary/Chest: Effort normal and breath sounds normal. He has no wheezes. He has no rales.  Abdominal: Soft. Bowel sounds are normal. He exhibits no distension and no mass. There is no tenderness. There is no rebound and no guarding.  Lymphadenopathy:    He has no cervical adenopathy.  Neurological: He is alert and oriented to person, place, and time. No cranial nerve deficit.  Skin: Skin is warm and dry. No rash noted. He is not diaphoretic.  Psychiatric: He has a normal mood and affect. His behavior is normal.  Nursing note and vitals reviewed.   PNEUMOVAX    Assessment & Plan:   1. Other abnormal glucose   2. Vitamin D deficiency   3. Essential hypertension, benign   4. Depression   5. Chronic pain syndrome   6. Paroxysmal atrial fibrillation ([name])   7. Fatigue due to depression   8. Need for prophylactic vaccination against Streptococcus pneumoniae (pneumococcus)   9. Hyperlipidemia   10. Functional diarrhea   11. Urinary urgency    1. Glucose intolerance: stable; obtain labs; continue with dietary modification. 2.  HTN: uncontrolled; add Metoprolol ER 25mg  daily; continue Amlodipine 10mg  daily; obtain labs. 3.  Depression and anxiety: stable/improved;  decrease in family stressors; continue Cymbalta. 4.  PAF: stable; NSR today yet heart rate in 90-100s.  Obtain labs; add Metoprolol ER 25mg  daily; continue Eliquis per cardiology. 5.  Hyperlipidemia: stable; obtain labs;continue dietary modification. 6.  Diarrhea: New; every morning; hold beer at bedtime; consider follow-up with GI if persists. 7.Urinary urgency: new; onset in past month; send urine culture and PSA; treat with Cipro for prostatitis.   8.  S/p Pneumovax.   Orders Placed This Encounter  Procedures  . Urine culture  . Pneumococcal polysaccharide vaccine 23-valent greater than or equal to 2yo subcutaneous/IM  . CBC with Differential/Platelet  . Comprehensive metabolic panel    Order Specific Question:  Has the patient fasted?    Answer:  Yes  . Hemoglobin A1c  . Lipid panel    Order Specific Question:  Has the patient fasted?    Answer:  Yes  . PSA  . POCT urinalysis dipstick  . POCT Microscopic Urinalysis ([facility])   Meds ordered this encounter  Medications  . metoprolol succinate (TOPROL-XL) 25 MG 24 hr tablet    Sig: Take 1 tablet (25 mg total) by mouth daily.    Dispense:  90 tablet    Refill:  3  . ciprofloxacin (CIPRO) 500 MG tablet    Sig: Take 1 tablet (500 mg total) by mouth 2 (two) times daily.    Dispense:  20 tablet    Refill:  0    Return in about 6 months (around 08/21/2016) for complete physical examiniation.    Markale Birdsell Elayne Guerin, M.D. [facility] [address] [place], [place]  [address] [phone] phone [phone] fax

## 2016-02-20 LAB — HEMOGLOBIN A1C
Hgb A1c MFr Bld: 6.1 % — ABNORMAL HIGH (ref <5.7)
Mean Plasma Glucose: 128 mg/dL — ABNORMAL HIGH (ref <117)

## 2016-02-20 LAB — PSA: PSA: 0.57 ng/mL (ref <=4.00)

## 2016-02-20 LAB — URINE CULTURE
COLONY COUNT: NO GROWTH
ORGANISM ID, BACTERIA: NO GROWTH

## 2016-02-23 DIAGNOSIS — M25512 Pain in left shoulder: Secondary | ICD-10-CM | POA: Diagnosis not present

## 2016-02-27 ENCOUNTER — Other Ambulatory Visit: Payer: Self-pay | Admitting: Cardiovascular Disease

## 2016-02-27 MED ORDER — RIVAROXABAN 20 MG PO TABS: 20.0000 mg | ORAL_TABLET | Freq: Every day | ORAL | Status: DC

## 2016-02-27 NOTE — Telephone Encounter (Signed)
Advised wife of medication change

## 2016-02-27 NOTE — Telephone Encounter (Signed)
Pt wife stated that Eliquis cost $214/month for 30 supply and they cannot afford. Pt would like to be switched to preferred medications according to insurance.  Contacted pharmacy and preferred anticoagulants are Pradaxa and Xarelto. Told her would send not to MD and PharmD to review options and get back with him.  Pt only has enough medication for this morning. Pt going to pick up one weeks worth samples from [facility] office and aware our office will be in contact to talk about changes.

## 2016-02-27 NOTE — Telephone Encounter (Signed)
Pt's wife called in wanting to speak with Dr. Blenda Mounts nurse about changing his Xarelto medication to a more affordable blood thinner. She says that the copay is $200 and he can not afford that and he is down to 2 days left on his medication. Please f/u with pt  Thanks

## 2016-02-27 NOTE — Telephone Encounter (Signed)
Please restart Xarelto 20 mg daily and stop Eliquis.

## 2016-03-04 DIAGNOSIS — M150 Primary generalized (osteo)arthritis: Secondary | ICD-10-CM | POA: Diagnosis not present

## 2016-03-04 DIAGNOSIS — Z79891 Long term (current) use of opiate analgesic: Secondary | ICD-10-CM | POA: Diagnosis not present

## 2016-03-04 DIAGNOSIS — M47816 Spondylosis without myelopathy or radiculopathy, lumbar region: Secondary | ICD-10-CM | POA: Diagnosis not present

## 2016-03-04 DIAGNOSIS — M47812 Spondylosis without myelopathy or radiculopathy, cervical region: Secondary | ICD-10-CM | POA: Diagnosis not present

## 2016-03-10 ENCOUNTER — Other Ambulatory Visit: Payer: Self-pay | Admitting: Family Medicine

## 2016-03-11 ENCOUNTER — Encounter: Payer: Self-pay | Admitting: Family Medicine

## 2016-03-25 ENCOUNTER — Other Ambulatory Visit: Payer: Self-pay | Admitting: Family Medicine

## 2016-04-10 DIAGNOSIS — M25512 Pain in left shoulder: Secondary | ICD-10-CM | POA: Diagnosis not present

## 2016-04-21 NOTE — Progress Notes (Signed)
Cardiology Office Note   Date:  04/22/2016   ID:  David Weaver 22-Mar-1951, MRN [id number]  PCP:  David Forts, MD  Cardiologist:   David Latch, MD   Chief Complaint  Patient presents with  . Follow-up    Pt states no Sx      History of Present Illness: David Weaver is a 65 y.o. male with paroxysmal atrial fibrillation, hypertension and lupus who presents for follow up.  David Weaver was seen in clinic on 08/07/15 for a presurgical assessment.  At that time he was cleared for shoulder surgery.  During surgery David Weaver went into atrial fibrillation with RVR.  He reportedly went back into sinus rhythm without intervention. He was started on Eliquis 5mg  daily and referred to follow up with cardiology.  He subsequently had a 30 day event monitor to determine whether he needed to continue on anticoagulation. The monitor did reveal recurrent atrial fibrillation so anticoagulation was continued.  Overall he has been doing well.  He notes some dizziness when bending over.  He has not noted any chest pain, though he does note minimal shortness of breath when walking up an incline.  It usually gets better as he continues to walk.  He has not notices any lower extremity edema, orthopnea or PND.  He doesn't remember having any palpitations recently.   Past Medical History  Diagnosis Date  . Other abnormal glucose   . Allergic rhinitis, cause unspecified   . Osteoarthrosis, unspecified whether generalized or localized, lower leg   . Other specified disorder of male genital organs(608.89)   . Alcohol abuse, daily use   . Essential hypertension, benign   . Esophageal reflux   . Lupus erythematosus   . Unspecified hearing loss   . Insomnia, unspecified   . Unspecified vitamin D deficiency   . Left knee DJD   . Fibromyalgia     followed by David Weaver every six months.  . Dysthymic disorder   . Impotence of organic origin     Past Surgical History  Procedure Laterality Date  .  Cholecystectomy    . Carpal tunnel release      bilateral  . Ganglion cyst excision  1980    Left wrist  . Knee arthroscopy  2008    Right  . Lumbar spine epidural  2009    [facility]  . Rotator cuff repair  10/2008    right  . Total knee arthroplasty  12/27/2012    Procedure: TOTAL KNEE ARTHROPLASTY;  Surgeon: David Junes, MD;  Location: [facility];  Service: Orthopedics;  Laterality: Right;  RIGHT ARTHROPLASTY KNEE MEDIAL AND LATERAL COMPARTMENTS WITH PATELLA RESURFACING, RIGHT TOTAL KNEE REPLACEMENT  . Total knee arthroplasty Left 01/31/2013    Procedure: TOTAL KNEE ARTHROPLASTY;  Surgeon: David Junes, MD;  Location: [facility];  Service: Orthopedics;  Laterality: Left;  . Joint replacement  01/2013    right knee, left knee. David Weaver.  . Colonoscopy  03/01/2014    two polyps; Dr. Benson Weaver.  Repeat 5 years.     Current Outpatient Prescriptions  Medication Sig Dispense Refill  . amLODipine (NORVASC) 10 MG tablet TAKE 1 TABLET (10 MG TOTAL) BY MOUTH DAILY. 30 tablet 3  . ciprofloxacin (CIPRO) 500 MG tablet Take 1 tablet (500 mg total) by mouth 2 (two) times daily. 20 tablet 0  . Cyanocobalamin (VITAMIN B12 PO) Take by mouth daily.    . DULoxetine (CYMBALTA) 60 MG capsule TAKE ONE CAPSULE  BY MOUTH TWICE A DAY 60 capsule 5  . hydroxychloroquine (PLAQUENIL) 200 MG tablet Take 200 mg by mouth 2 (two) times daily.    . methocarbamol (ROBAXIN) 500 MG tablet TAKE 1 TO 2 TABLETS BY MOUTH EVERY 8 HOURS AS NEEDED FOR MUSCLEPASMS 60 tablet 5  . metoprolol succinate (TOPROL-XL) 25 MG 24 hr tablet Take 1 tablet (25 mg total) by mouth daily. 90 tablet 3  . omeprazole (PRILOSEC) 20 MG capsule TAKE 1 CAPSULE (40 MG TOTAL) BY MOUTH DAILY. 30 capsule 11  . oxyCODONE-acetaminophen (PERCOCET/ROXICET) 5-325 MG tablet Take 1 tablet by mouth every 4 (four) hours as needed. for pain  0  . rivaroxaban (XARELTO) 20 MG TABS tablet Take 1 tablet (20 mg total) by mouth daily with supper. 30 tablet 5  . traZODone  (DESYREL) 50 MG tablet TAKE 1 TO 2 TABLETS BY MOUTH EVERY DAY AT BEDTIME AS NEEDED FOR SLEEP 60 tablet 5  . Vitamin D, Ergocalciferol, (DRISDOL) 50000 UNITS CAPS capsule TAKE 1 CAPSULE EVERY 7 DAYS 4 capsule 0  . Vitamin D, Ergocalciferol, (DRISDOL) 50000 UNITS CAPS capsule TAKE 1 CAPSULE (50,000 UNITS TOTAL) BY MOUTH EVERY 7 (SEVEN) DAYS. DUE FOR VIT D LAB AT FOLLOW UP. 12 capsule 1  . VOLTAREN 1 % GEL APPLY 2 TO 4 GRAMS TOPICALLY 4 TIMES DAILY 100 g 4  . predniSONE (DELTASONE) 5 MG tablet Take 5 mg by mouth every morning.  1   No current facility-administered medications for this visit.    Allergies:   Review of patient's allergies indicates no known allergies.    Social History:  The patient  reports that he quit smoking about 7 years ago. His smoking use included Cigarettes. He has a 60 pack-year smoking history. He has never used smokeless tobacco. He reports that he drinks about 6.0 oz of alcohol per week. He reports that he does not use illicit drugs.   Family History:  The patient's family history includes Alcohol abuse in his father; Alzheimer's disease in his mother; Cancer in his brother; Diabetes in his brother; Heart disease (age of onset: 34) in his father; Kidney failure in his brother; Osteoporosis in his mother.    ROS:  Please see the history of present illness.   Otherwise, review of systems are positive for shoulder pain.   All other systems are reviewed and negative.    PHYSICAL EXAM: VS:  BP 139/86 mmHg  Pulse 64  Ht 5\' 8"  (1.727 m)  Wt 81.194 kg (179 lb)  BMI 27.22 kg/m2 , BMI Body mass index is 27.22 kg/(m^2). GENERAL:  Well appearing HEENT:  Pupils equal round and reactive, fundi not visualized, oral mucosa unremarkable NECK:  No jugular venous distention, waveform within normal limits, carotid upstroke brisk and symmetric, no bruits, no thyromegaly LYMPHATICS:  No cervical adenopathy LUNGS:  Clear to auscultation bilaterally HEART:  RRR.  PMI not displaced or  sustained,S1 and S2 within normal limits, no S3, no S4, no clicks, no rubs, no murmurs ABD:  Flat, positive bowel sounds normal in frequency in pitch, no bruits, no rebound, no guarding, no midline pulsatile mass, no hepatomegaly, no splenomegaly EXT:  2 plus pulses throughout, no edema, no cyanosis no clubbing SKIN:  No rashes no nodules NEURO:  Cranial nerves II through XII grossly intact, motor grossly intact throughout PSYCH:  Cognitively intact, oriented to person place and time    EKG:  EKG is ordered today. Sinus rhythm rate 64 bpm. Nonspecific T wave changes.  8/15;16: Sinus rhythm 93 bpm.  Non-specific t wave changes. 04/05/14: Ectopic atrial rhythm. Non-specific inferior T wave changes.  Recent Labs: 08/27/2015: Magnesium 1.8 10/03/2015: TSH 4.380 02/19/2016: ALT 20; BUN 17; Creat 0.71; Hemoglobin 15.4; Platelets 357; Potassium 4.1; Sodium 136    Lipid Panel    Component Value Date/Time   CHOL 223* 02/19/2016 0814   TRIG 220* 02/19/2016 0814   HDL 65 02/19/2016 0814   CHOLHDL 3.4 02/19/2016 0814   VLDL 44* 02/19/2016 0814   LDLCALC 114 02/19/2016 0814      Wt Readings from Last 3 Encounters:  04/22/16 81.194 kg (179 lb)  02/19/16 81.466 kg (179 lb 9.6 oz)  10/03/15 82.101 kg (181 lb)      Other studies Reviewed: Additional studies/ records that were reviewed today include:. Review of the above records demonstrates:  Please see elsewhere in the note.     ASSESSMENT AND PLAN:  # Paroxysmal atrial fibrillation: Mr. Suman had an episode of transient atrial fibrillation during surgery and was noted to have recurrent episodes on the 30 day event monitor.  He is currently asymptomatic and in sinus rhythm.  He switched from Eliquis to Xarelto due to insurance coverage.  This patients CHA2DS2-VASc Score and unadjusted Ischemic Stroke Rate (% per year) is equal to 2.2 % stroke rate/year from a score of 2  Above score calculated as 1 point each if present [CHF, HTN, DM,  Vascular=MI/PAD/Aortic Plaque, Age if 65-74, or Male] Above score calculated as 2 points each if present [Age > 75, or Stroke/TIA/TE]  # Hypertension: BP well-controlled.  Continue amlodipine and metoprolol.   Current medicines are reviewed at length with the patient today.  The patient does not have concerns regarding medicines.  The following changes have been made:  no change  Labs/ tests ordered today include:  Orders Placed This Encounter  Procedures  . EKG 12-Lead     Disposition:   FU with Dr. Jonelle Sidle C. Oval Linsey in 1 year    Signed, David Latch, MD  04/22/2016 11:59 AM    [facility]

## 2016-04-22 ENCOUNTER — Ambulatory Visit (INDEPENDENT_AMBULATORY_CARE_PROVIDER_SITE_OTHER): Payer: Medicare Other | Admitting: Cardiovascular Disease

## 2016-04-22 ENCOUNTER — Encounter: Payer: Self-pay | Admitting: Cardiovascular Disease

## 2016-04-22 VITALS — BP 139/86 | HR 64 | Ht 68.0 in | Wt 179.0 lb

## 2016-04-22 DIAGNOSIS — I10 Essential (primary) hypertension: Secondary | ICD-10-CM

## 2016-04-22 DIAGNOSIS — I4891 Unspecified atrial fibrillation: Secondary | ICD-10-CM | POA: Diagnosis not present

## 2016-04-22 NOTE — Patient Instructions (Signed)

## 2016-04-29 DIAGNOSIS — M150 Primary generalized (osteo)arthritis: Secondary | ICD-10-CM | POA: Diagnosis not present

## 2016-04-29 DIAGNOSIS — M47812 Spondylosis without myelopathy or radiculopathy, cervical region: Secondary | ICD-10-CM | POA: Diagnosis not present

## 2016-04-29 DIAGNOSIS — Z79891 Long term (current) use of opiate analgesic: Secondary | ICD-10-CM | POA: Diagnosis not present

## 2016-04-29 DIAGNOSIS — M47816 Spondylosis without myelopathy or radiculopathy, lumbar region: Secondary | ICD-10-CM | POA: Diagnosis not present

## 2016-05-06 ENCOUNTER — Other Ambulatory Visit: Payer: Self-pay

## 2016-05-06 MED ORDER — VITAMIN D (ERGOCALCIFEROL) 1.25 MG (50000 UNIT) PO CAPS: ORAL_CAPSULE | ORAL | Status: DC

## 2016-05-15 ENCOUNTER — Telehealth: Payer: Self-pay | Admitting: *Deleted

## 2016-05-15 NOTE — Telephone Encounter (Signed)
Eliquis samples from March placed back in closet. Patient no longer taking.

## 2016-05-22 DIAGNOSIS — S46012D Strain of muscle(s) and tendon(s) of the rotator cuff of left shoulder, subsequent encounter: Secondary | ICD-10-CM | POA: Diagnosis not present

## 2016-05-22 DIAGNOSIS — S46011D Strain of muscle(s) and tendon(s) of the rotator cuff of right shoulder, subsequent encounter: Secondary | ICD-10-CM | POA: Diagnosis not present

## 2016-05-26 ENCOUNTER — Telehealth: Payer: Self-pay

## 2016-05-26 NOTE — Telephone Encounter (Signed)
Patient's spouse is calling to request a refill for nystatin. She states that it was prescribed sometime last year and that patient has a yeast build up. [phone] [facility]

## 2016-05-28 NOTE — Telephone Encounter (Signed)
Patient spouse is following up on refill request. Please call!

## 2016-05-29 NOTE — Telephone Encounter (Signed)
I don't see any record of [place] Rxing it for pt. Pt will need eval first. Please notify pt he will need to come in.

## 2016-06-02 NOTE — Telephone Encounter (Signed)
Called and advised pt's wife that I don't see any records of this Rx from [place]. We determined that it was probably longer ago than she thought and maybe Dr [name] had Rxd it when she was at [facility]. Wife stated that the pharm had advised them to try a combination of OTC creams and his rash is so much better. They will come in if it worsens again.

## 2016-06-10 ENCOUNTER — Telehealth: Payer: Self-pay

## 2016-06-10 NOTE — Telephone Encounter (Signed)
[facility] - no repsonse to the requests sent on the 7th and 10th   omeprazole (PRILOSEC) 20 MG capsule traZODone (DESYREL) 50 MG tablet   [facility] [address]   [phone]

## 2016-06-11 ENCOUNTER — Other Ambulatory Visit: Payer: Self-pay | Admitting: Family Medicine

## 2016-06-12 NOTE — Telephone Encounter (Signed)
Sent in RFs.

## 2016-06-23 DIAGNOSIS — M150 Primary generalized (osteo)arthritis: Secondary | ICD-10-CM | POA: Diagnosis not present

## 2016-06-23 DIAGNOSIS — M47816 Spondylosis without myelopathy or radiculopathy, lumbar region: Secondary | ICD-10-CM | POA: Diagnosis not present

## 2016-06-23 DIAGNOSIS — M47812 Spondylosis without myelopathy or radiculopathy, cervical region: Secondary | ICD-10-CM | POA: Diagnosis not present

## 2016-06-23 DIAGNOSIS — Z79891 Long term (current) use of opiate analgesic: Secondary | ICD-10-CM | POA: Diagnosis not present

## 2016-06-24 ENCOUNTER — Ambulatory Visit
Admission: RE | Admit: 2016-06-24 | Discharge: 2016-06-24 | Disposition: A | Discharge status: Home / Self Care | Payer: Medicare Other | Source: Ambulatory Visit | Attending: Anesthesiology | Admitting: Anesthesiology

## 2016-06-24 ENCOUNTER — Other Ambulatory Visit: Payer: Self-pay | Admitting: Anesthesiology

## 2016-06-24 DIAGNOSIS — R52 Pain, unspecified: Secondary | ICD-10-CM | POA: Diagnosis not present

## 2016-06-24 DIAGNOSIS — M25552 Pain in left hip: Secondary | ICD-10-CM

## 2016-06-24 DIAGNOSIS — Z79899 Other long term (current) drug therapy: Secondary | ICD-10-CM | POA: Diagnosis not present

## 2016-06-24 DIAGNOSIS — R300 Dysuria: Secondary | ICD-10-CM | POA: Diagnosis not present

## 2016-07-21 ENCOUNTER — Other Ambulatory Visit: Payer: Self-pay | Admitting: Family Medicine

## 2016-08-05 ENCOUNTER — Other Ambulatory Visit: Payer: Self-pay | Admitting: Family Medicine

## 2016-08-18 DIAGNOSIS — M47812 Spondylosis without myelopathy or radiculopathy, cervical region: Secondary | ICD-10-CM | POA: Diagnosis not present

## 2016-08-18 DIAGNOSIS — M47816 Spondylosis without myelopathy or radiculopathy, lumbar region: Secondary | ICD-10-CM | POA: Diagnosis not present

## 2016-08-18 DIAGNOSIS — M150 Primary generalized (osteo)arthritis: Secondary | ICD-10-CM | POA: Diagnosis not present

## 2016-08-18 DIAGNOSIS — Z79891 Long term (current) use of opiate analgesic: Secondary | ICD-10-CM | POA: Diagnosis not present

## 2016-08-26 ENCOUNTER — Other Ambulatory Visit: Payer: Self-pay | Admitting: Family Medicine

## 2016-08-27 ENCOUNTER — Other Ambulatory Visit: Payer: Self-pay | Admitting: Cardiovascular Disease

## 2016-08-27 NOTE — Telephone Encounter (Signed)
Please review for refill. Thanks!  

## 2016-08-28 NOTE — Telephone Encounter (Signed)
Given enough until appt 10/3. - Needed OV

## 2016-08-31 ENCOUNTER — Other Ambulatory Visit: Payer: Self-pay | Admitting: Family Medicine

## 2016-09-02 ENCOUNTER — Ambulatory Visit (INDEPENDENT_AMBULATORY_CARE_PROVIDER_SITE_OTHER): Payer: Medicare Other | Admitting: Family Medicine

## 2016-09-02 ENCOUNTER — Encounter: Payer: Self-pay | Admitting: Family Medicine

## 2016-09-02 VITALS — BP 140/82 | HR 92 | Temp 98.1°F | Resp 16 | Ht 66.25 in | Wt 180.6 lb

## 2016-09-02 DIAGNOSIS — L930 Discoid lupus erythematosus: Secondary | ICD-10-CM

## 2016-09-02 DIAGNOSIS — Z125 Encounter for screening for malignant neoplasm of prostate: Secondary | ICD-10-CM | POA: Diagnosis not present

## 2016-09-02 DIAGNOSIS — I10 Essential (primary) hypertension: Secondary | ICD-10-CM

## 2016-09-02 DIAGNOSIS — F1010 Alcohol abuse, uncomplicated: Secondary | ICD-10-CM

## 2016-09-02 DIAGNOSIS — E7800 Pure hypercholesterolemia, unspecified: Secondary | ICD-10-CM | POA: Diagnosis not present

## 2016-09-02 DIAGNOSIS — M1712 Unilateral primary osteoarthritis, left knee: Secondary | ICD-10-CM

## 2016-09-02 DIAGNOSIS — F3341 Major depressive disorder, recurrent, in partial remission: Secondary | ICD-10-CM

## 2016-09-02 DIAGNOSIS — R7309 Other abnormal glucose: Secondary | ICD-10-CM | POA: Diagnosis not present

## 2016-09-02 DIAGNOSIS — M1711 Unilateral primary osteoarthritis, right knee: Secondary | ICD-10-CM

## 2016-09-02 DIAGNOSIS — J301 Allergic rhinitis due to pollen: Secondary | ICD-10-CM | POA: Diagnosis not present

## 2016-09-02 DIAGNOSIS — I480 Paroxysmal atrial fibrillation: Secondary | ICD-10-CM

## 2016-09-02 DIAGNOSIS — E559 Vitamin D deficiency, unspecified: Secondary | ICD-10-CM | POA: Diagnosis not present

## 2016-09-02 DIAGNOSIS — Z0000 Encounter for general adult medical examination without abnormal findings: Secondary | ICD-10-CM

## 2016-09-02 DIAGNOSIS — G894 Chronic pain syndrome: Secondary | ICD-10-CM

## 2016-09-02 DIAGNOSIS — K219 Gastro-esophageal reflux disease without esophagitis: Secondary | ICD-10-CM | POA: Diagnosis not present

## 2016-09-02 DIAGNOSIS — Z23 Encounter for immunization: Secondary | ICD-10-CM

## 2016-09-02 DIAGNOSIS — R300 Dysuria: Secondary | ICD-10-CM

## 2016-09-02 DIAGNOSIS — F10982 Alcohol use, unspecified with alcohol-induced sleep disorder: Secondary | ICD-10-CM | POA: Diagnosis not present

## 2016-09-02 DIAGNOSIS — H903 Sensorineural hearing loss, bilateral: Secondary | ICD-10-CM

## 2016-09-02 LAB — CBC WITH DIFFERENTIAL/PLATELET
BASOS PCT: 0 %
Basophils Absolute: 0 cells/uL (ref 0–200)
EOS PCT: 3 %
Eosinophils Absolute: 150 cells/uL (ref 15–500)
HEMATOCRIT: 44.9 % (ref 38.5–50.0)
HEMOGLOBIN: 15.6 g/dL (ref 13.2–17.1)
LYMPHS ABS: 900 {cells}/uL (ref 850–3900)
Lymphocytes Relative: 18 %
MCH: 30.2 pg (ref 27.0–33.0)
MCHC: 34.7 g/dL (ref 32.0–36.0)
MCV: 87 fL (ref 80.0–100.0)
MONO ABS: 450 {cells}/uL (ref 200–950)
MPV: 9.6 fL (ref 7.5–12.5)
Monocytes Relative: 9 %
NEUTROS ABS: 3500 {cells}/uL (ref 1500–7800)
Neutrophils Relative %: 70 %
Platelets: 261 10*3/uL (ref 140–400)
RBC: 5.16 MIL/uL (ref 4.20–5.80)
RDW: 13.2 % (ref 11.0–15.0)
WBC: 5 10*3/uL (ref 3.8–10.8)

## 2016-09-02 LAB — COMPREHENSIVE METABOLIC PANEL
ALBUMIN: 4.3 g/dL (ref 3.6–5.1)
ALK PHOS: 69 U/L (ref 40–115)
ALT: 24 U/L (ref 9–46)
AST: 18 U/L (ref 10–35)
BILIRUBIN TOTAL: 0.6 mg/dL (ref 0.2–1.2)
BUN: 10 mg/dL (ref 7–25)
CALCIUM: 9.1 mg/dL (ref 8.6–10.3)
CO2: 23 mmol/L (ref 20–31)
CREATININE: 0.66 mg/dL — AB (ref 0.70–1.25)
Chloride: 103 mmol/L (ref 98–110)
GLUCOSE: 105 mg/dL — AB (ref 65–99)
Potassium: 4 mmol/L (ref 3.5–5.3)
SODIUM: 138 mmol/L (ref 135–146)
Total Protein: 6.5 g/dL (ref 6.1–8.1)

## 2016-09-02 LAB — POCT URINALYSIS DIP (MANUAL ENTRY)
Bilirubin, UA: NEGATIVE
Blood, UA: NEGATIVE
GLUCOSE UA: NEGATIVE
Ketones, POC UA: NEGATIVE
LEUKOCYTES UA: NEGATIVE
NITRITE UA: NEGATIVE
PROTEIN UA: NEGATIVE
Spec Grav, UA: 1.02
UROBILINOGEN UA: 0.2
pH, UA: 7

## 2016-09-02 LAB — LIPID PANEL
Cholesterol: 168 mg/dL (ref 125–200)
HDL: 43 mg/dL (ref >=40)
LDL CALC: 82 mg/dL (ref <130)
TRIGLYCERIDES: 216 mg/dL — AB (ref <150)
Total CHOL/HDL Ratio: 3.9 Ratio (ref <=5.0)
VLDL: 43 mg/dL — ABNORMAL HIGH (ref <30)

## 2016-09-02 MED ORDER — AMLODIPINE BESYLATE 10 MG PO TABS: 10.0000 mg | ORAL_TABLET | Freq: Every day | ORAL | 3 refills | Status: DC

## 2016-09-02 MED ORDER — TRAZODONE HCL 50 MG PO TABS: ORAL_TABLET | ORAL | 3 refills | Status: DC

## 2016-09-02 MED ORDER — CIPROFLOXACIN HCL 500 MG PO TABS: 500.0000 mg | ORAL_TABLET | Freq: Two times a day (BID) | ORAL | 0 refills | Status: DC

## 2016-09-02 MED ORDER — DULOXETINE HCL 60 MG PO CPEP: 60.0000 mg | ORAL_CAPSULE | Freq: Two times a day (BID) | ORAL | 3 refills | Status: DC

## 2016-09-02 MED ORDER — OMEPRAZOLE 20 MG PO CPDR: DELAYED_RELEASE_CAPSULE | ORAL | 3 refills | Status: DC

## 2016-09-02 MED ORDER — METOPROLOL SUCCINATE ER 50 MG PO TB24: 50.0000 mg | ORAL_TABLET | Freq: Every day | ORAL | 3 refills | Status: DC

## 2016-09-02 NOTE — Patient Instructions (Addendum)
   IF you received an x-ray today, you will receive an invoice from [facility]. Please contact [facility] at [phone] with questions or concerns regarding your invoice.   IF you received labwork today, you will receive an invoice from [facility]. Please contact [facility] at [phone] with questions or concerns regarding your invoice.   Our billing staff will not be able to assist you with questions regarding bills from these companies.  You will be contacted with the lab results as soon as they are available. The fastest way to get your results is to activate your My Chart account. Instructions are located on the last page of this paperwork. If you have not heard from us regarding the results in 2 weeks, please contact this office.    Keeping you healthy  Get these tests  Blood pressure- Have your blood pressure checked once a year by your healthcare provider.  Normal blood pressure is 120/80  Weight- Have your body mass index (BMI) calculated to screen for obesity.  BMI is a measure of body fat based on height and weight. You can also calculate your own BMI at www.[facility].com/bmi/.  Cholesterol- Have your cholesterol checked every year.  Diabetes- Have your blood sugar checked regularly if you have high blood pressure, high cholesterol, have a family history of diabetes or if you are overweight.  Screening for Colon Cancer- Colonoscopy starting at age 50.  Screening may begin sooner depending on your family history and other health conditions. Follow up colonoscopy as directed by your Gastroenterologist.  Screening for Prostate Cancer- Both blood work (PSA) and a rectal exam help screen for Prostate Cancer.  Screening begins at age 40 with African-American men and at age 50 with Caucasian men.  Screening may begin sooner depending on your family history.  Take these medicines  Aspirin- One aspirin daily can help prevent Heart  disease and Stroke.  Flu shot- Every fall.  Tetanus- Every 10 years.  Zostavax- Once after the age of 60 to prevent Shingles.  Pneumonia shot- Once after the age of 65; if you are younger than 65, ask your healthcare provider if you need a Pneumonia shot.  Take these steps  Don't smoke- If you do smoke, talk to your doctor about quitting.  For tips on how to quit, go to www.smokefree.gov or call 1-800-QUIT-NOW.  Be physically active- Exercise 5 days a week for at least 30 minutes.  If you are not already physically active start slow and gradually work up to 30 minutes of moderate physical activity.  Examples of moderate activity include walking briskly, mowing the yard, dancing, swimming, bicycling, etc.  Eat a healthy diet- Eat a variety of healthy food such as fruits, vegetables, low fat milk, low fat cheese, yogurt, lean meant, poultry, fish, beans, tofu, etc. For more information go to www.thenutritionsource.org  Drink alcohol in moderation- Limit alcohol intake to less than two drinks a day. Never drink and drive.  Dentist- Brush and floss twice daily; visit your dentist twice a year.  Depression- Your emotional health is as important as your physical health. If you're feeling down, or losing interest in things you would normally enjoy please talk to your healthcare provider.  Eye exam- Visit your eye doctor every year.  Safe sex- If you may be exposed to a sexually transmitted infection, use a condom.  Seat belts- Seat belts can save your life; always wear one.  Smoke/Carbon Monoxide detectors- These detectors need to be installed on   the appropriate level of your home.  Replace batteries at least once a year.  Skin cancer- When out in the sun, cover up and use sunscreen 15 SPF or higher.  Violence- If anyone is threatening you, please tell your healthcare provider.  Living Will/ Health care power of attorney- Speak with your healthcare provider and family. 

## 2016-09-02 NOTE — Progress Notes (Signed)
Subjective:    Patient ID: David Weaver, male    DOB: 10/04/1951, 65 y.o.   MRN: [id number]  09/02/2016  Annual Exam and URINARY BURNING (X 2 WEEKS)   HPI This 65 y.o. male presents for Annual Wellness Examination.  Last physical: 04-13-2014 Colonoscopy:  2015 TDAP: 2013 Pneumovax: 2016; 2017 Zostavax:2012 Influenza: today Eye exam:  +glasses; due; 2016 Dental exam:  Every six months.  Immunization History  Administered Date(s) Administered  . Influenza, Seasonal, Injecte, Preservative Fre 11/01/2012  . Influenza,inj,Quad PF,36+ Mos 11/21/2013, 10/11/2014, 10/03/2015, 09/02/2016  . Influenza-Unspecified 08/20/2009, 08/19/2010, 08/12/2011  . Pneumococcal Conjugate-13 02/12/2015  . Pneumococcal Polysaccharide-23 02/19/2016  . Pneumococcal-Unspecified 03/03/2008  . Td 07/01/2001  . Tdap 02/03/2012  . Zoster 08/12/2011    HTN: Patient reports good compliance with medication, good tolerance to medication, and good symptom control.  Home BP running 174-156/70s.  Ran out of BP medication in four days.    Atrial fibrillation: Patient reports good compliance with medication, good tolerance to medication, and good symptom control.  Xarelto and Metoprolol.  Anxiety and depression: Patient reports good compliance with medication, good tolerance to medication, and good symptom control.  Cmybalta 120mg  daily.  Fibromyalgia: followed by [name] every six months.   Discoid lupus/Lupus: .Patient reports good compliance with medication, good tolerance to medication, and good symptom control.    GERD: Patient reports good compliance with medication, good tolerance to medication, and good symptom control.    Chronic pain syndrome: taking 3 percocet daily; followed by Hardin Negus every two months; back lumbar, knees. Dr. Noemi Chapel prescribed Prednisone 5mg  daily; weaned off of Prednisone.    Insomnia: Patient reports good compliance with medication, good tolerance to medication, and good  symptom control.  Trazodone qhs 2 with good results.  Some nights must take 3.    Burning with urination: onset two weeks ago; improvement from last visit.  Possibly three weeks ago.  No urinary frequency.  Nocturia x 1.     Review of Systems  Constitutional: Negative for activity change, appetite change, chills, diaphoresis, fatigue, fever and unexpected weight change.  HENT: Negative for congestion, dental problem, drooling, ear discharge, ear pain, facial swelling, hearing loss, mouth sores, nosebleeds, postnasal drip, rhinorrhea, sinus pressure, sneezing, sore throat, tinnitus, trouble swallowing and voice change.   Eyes: Negative for photophobia, pain, discharge, redness, itching and visual disturbance.  Respiratory: Negative for apnea, cough, choking, chest tightness, shortness of breath, wheezing and stridor.   Cardiovascular: Negative for chest pain, palpitations and leg swelling.  Gastrointestinal: Positive for diarrhea. Negative for abdominal pain, blood in stool, constipation, nausea and vomiting.       2-3 diarrheal stools per day; chronic.  Endocrine: Negative for cold intolerance, heat intolerance, polydipsia, polyphagia and polyuria.  Genitourinary: Positive for dysuria. Negative for decreased urine volume, difficulty urinating, discharge, enuresis, flank pain, frequency, genital sores, hematuria, penile pain, penile swelling, scrotal swelling, testicular pain and urgency.       Nocturia x 1.  Urine stream is strong.  No urinary leakage.  +impotence.  Musculoskeletal: Negative for arthralgias, back pain, gait problem, joint swelling, myalgias, neck pain and neck stiffness.  Skin: Negative for color change, pallor, rash and wound.  Allergic/Immunologic: Negative for environmental allergies, food allergies and immunocompromised state.  Neurological: Negative for dizziness, tremors, seizures, syncope, facial asymmetry, speech difficulty, weakness, light-headedness, numbness and  headaches.  Hematological: Negative for adenopathy. Does not bruise/bleed easily.  Psychiatric/Behavioral: Negative for agitation, behavioral problems, confusion, decreased concentration, dysphoric mood, hallucinations,  self-injury, sleep disturbance and suicidal ideas. The patient is not nervous/anxious and is not hyperactive.     Past Medical History:  Diagnosis Date  . Alcohol abuse, daily use   . Allergic rhinitis, cause unspecified   . Dysthymic disorder   . Esophageal reflux   . Essential hypertension, benign   . Fibromyalgia    followed by [name] every six months.  . Impotence of organic origin   . Insomnia, unspecified   . Left knee DJD   . Lupus erythematosus   . Osteoarthrosis, unspecified whether generalized or localized, lower leg   . Other abnormal glucose   . Other specified disorder of male genital organs(608.89)   . Unspecified hearing loss   . Unspecified vitamin D deficiency    Past Surgical History:  Procedure Laterality Date  . CARPAL TUNNEL RELEASE     bilateral  . CHOLECYSTECTOMY    . COLONOSCOPY  03/01/2014   two polyps; Dr. Benson Norway.  Repeat 5 years.  [name] GANGLION CYST EXCISION  1980   Left wrist  . JOINT REPLACEMENT  01/2013   right knee, left knee. [facility].  [name] KNEE ARTHROSCOPY  2008   Right  . lumbar spine epidural  2009   [facility]  . ROTATOR CUFF REPAIR  10/2008   right  . TOTAL KNEE ARTHROPLASTY  12/27/2012   Procedure: TOTAL KNEE ARTHROPLASTY;  Surgeon: [name], MD;  Location: [place];  Service: Orthopedics;  Laterality: Right;  RIGHT ARTHROPLASTY KNEE MEDIAL AND LATERAL COMPARTMENTS WITH PATELLA RESURFACING, RIGHT TOTAL KNEE REPLACEMENT  . TOTAL KNEE ARTHROPLASTY Left 01/31/2013   Procedure: TOTAL KNEE ARTHROPLASTY;  Surgeon: [name], MD;  Location: [place];  Service: Orthopedics;  Laterality: Left;   No Known Allergies Current Outpatient Prescriptions  Medication Sig Dispense Refill  . amLODipine (NORVASC) 10 MG tablet  TAKE 1 TABLET BY MOUTH EVERY DAY 30 tablet 0  . DULoxetine (CYMBALTA) 60 MG capsule TAKE ONE CAPSULE BY MOUTH TWICE A DAY 30 capsule 0  . hydroxychloroquine (PLAQUENIL) 200 MG tablet Take 200 mg by mouth 2 (two) times daily.    . methocarbamol (ROBAXIN) 500 MG tablet TAKE 1 TO 2 TABLETS BY MOUTH EVERY 8 HOURS AS NEEDED FOR MUSCLEPASMS 60 tablet 5  . metoprolol succinate (TOPROL-XL) 25 MG 24 hr tablet Take 1 tablet (25 mg total) by mouth daily. 90 tablet 3  . omeprazole (PRILOSEC) 20 MG capsule TAKE 1 CAPSULE (40 MG TOTAL) BY MOUTH DAILY. 90 capsule 0  . oxyCODONE-acetaminophen (PERCOCET/ROXICET) 5-325 MG tablet Take 1 tablet by mouth every 4 (four) hours as needed. for pain  0  . traZODone (DESYREL) 50 MG tablet TAKE 1 OR 2 TABLETS BY MOUTH AT BEDTIME AS NEEDED FOR SLEEP 180 tablet 0  . Vitamin D, Ergocalciferol, (DRISDOL) 50000 units CAPS capsule TAKE ONE CAPSULE BY MOUTH EVERY 7 DAYS**DUE FOR LAB WORK FOLLOW UP** 12 capsule 0  . VOLTAREN 1 % GEL APPLY 2 TO 4 GRAMS TOPICALLY 4 TIMES DAILY 100 g 4  . XARELTO 20 MG TABS tablet TAKE 1 TABLET BY MOUTH DAILY WITH SUPPER. 30 tablet 5  . Cyanocobalamin (VITAMIN B12 PO) Take by mouth daily.    . predniSONE (DELTASONE) 5 MG tablet Take 5 mg by mouth every morning.  1   No current facility-administered medications for this visit.    Social History   Social History  . Marital status: Married    Spouse name: N/A  . Number of children: 1  .  Years of education: N/A   Occupational History  . [employer] cars/trucks   Social History Main Topics  . Smoking status: Former Smoker    Packs/day: 2.00    Years: 30.00    Types: Cigarettes    Quit date: 12/28/2008  . Smokeless tobacco: Never Used     Comment: quit 12 /2011  . Alcohol use 6.0 oz/week    6 Cans of beer, 4 Standard drinks or equivalent per week     Comment: 2 drinks of liquor, every afternoon    12/28/2012  wekends 4 5 beers  . Drug use: No  . Sexual activity:  Yes    Birth control/ protection: Post-menopausal   Other Topics Concern  . Not on file   Social History Narrative   Guns in the home stored in locked cabinet.       Caffeine use: 1 serving/ day.     Marital status:  Married x 25 years, third marriage, happily married.      Children: one son (65); one step-daughter; two grandsons      Lives: with wife. Mother-in-law going to move in in 2017.      Employment:  Works at [employer] on [employer]; [employer] work; happy; 25 hours per week.      Tobacco: none currently; quit 2013.  Smoked x 40 years.      Alcohol:  2 beers per night in 2017; much heavier use in fifties.      Drugs: none      Exercise:  None in 2017.      Seatbelt: 100%      ADLs: independent with ADLs in 2017; no assistant devices      Advanced Directives: none in 2017; FULL CODE in 2017; no prolonged measures   Family History  Problem Relation Age of Onset  . Alzheimer's disease Mother   . Osteoporosis Mother   . Heart disease Father 71    AMI  . Alcohol abuse Father   . Cancer Brother     colon cancer  . Diabetes Brother   . Kidney failure Brother        Objective:    BP 140/82 (BP Location: Left Arm, Patient Position: Sitting, Cuff Size: Normal)   Pulse 92   Temp 98.1 F (36.7 C) (Oral)   Resp 16   Ht 5' 6.25" (1.683 m)   Wt 180 lb 9.6 oz (81.9 kg)   SpO2 95%   BMI 28.93 kg/m  Physical Exam  Constitutional: He is oriented to person, place, and time. He appears well-developed and well-nourished. No distress.  HENT:  Head: Normocephalic and atraumatic.  Right Ear: External ear normal.  Left Ear: External ear normal.  Nose: Nose normal.  Mouth/Throat: Oropharynx is clear and moist.  Eyes: Conjunctivae and EOM are normal. Pupils are equal, round, and reactive to light.  Neck: Normal range of motion. Neck supple. Carotid bruit is not present. No thyromegaly present.  Cardiovascular: Normal rate, regular rhythm, normal heart sounds and intact distal pulses.   Exam reveals no gallop and no friction rub.   No murmur heard. Pulmonary/Chest: Effort normal and breath sounds normal. He has no wheezes. He has no rales.  Abdominal: Soft. Bowel sounds are normal. He exhibits no distension and no mass. There is no tenderness. There is no rebound and no guarding. Hernia confirmed negative in the right inguinal area and confirmed negative in the left inguinal area.  Genitourinary: Penis  normal. Right testis shows no mass, no swelling and no tenderness. Right testis is descended. Left testis shows no mass, no swelling and no tenderness. Left testis is descended. Circumcised.  Musculoskeletal:       Right shoulder: Normal.       Left shoulder: Normal.       Cervical back: Normal.  Lymphadenopathy:    He has no cervical adenopathy.       Right: No inguinal adenopathy present.       Left: No inguinal adenopathy present.  Neurological: He is alert and oriented to person, place, and time. He has normal reflexes. No cranial nerve deficit. He exhibits normal muscle tone. Coordination normal.  Skin: Skin is warm and dry. No rash noted. He is not diaphoretic.  Psychiatric: He has a normal mood and affect. His behavior is normal. Judgment and thought content normal.    Depression screen [facility] 2/9 09/02/2016 02/19/2016 10/03/2015 07/16/2015 05/24/2015  Decreased Interest 0 0 0 0 0  Down, Depressed, Hopeless 0 0 0 0 0  PHQ - 2 Score 0 0 0 0 0   Fall Risk  09/02/2016 02/19/2016 05/24/2015 02/12/2015 04/03/2014  Falls in the past year? No Yes Yes No No  Number falls in past yr: - 1 1 - -  Injury with Fall? - Yes Yes - -  Functional Status Survey: Is the patient deaf or have difficulty hearing?: Yes (bilateral - all the time) Does the patient have difficulty seeing, even when wearing glasses/contacts?: Yes (sometimes) Does the patient have difficulty concentrating, remembering, or making decisions?: Yes (sometimes) Does the patient have difficulty walking or climbing stairs?: Yes  (sometimes) Does the patient have difficulty dressing or bathing?: No Does the patient have difficulty doing errands alone such as visiting a doctor's office or shopping?: No   BP Readings from Last 3 Encounters:  09/02/16 140/82  04/22/16 139/86  02/19/16 (!) 169/80      Wt Readings from Last 3 Encounters:  09/02/16 180 lb 9.6 oz (81.9 kg)  04/22/16 179 lb (81.2 kg)  02/19/16 179 lb 9.6 oz (81.5 kg)    Assessment & Plan:   1. Encounter for Medicare annual wellness exam   2. Need for prophylactic vaccination and inoculation against influenza   3. Essential hypertension, benign   4. Paroxysmal atrial fibrillation ([name])   5. Acute seasonal allergic rhinitis due to pollen   6. Gastroesophageal reflux disease without esophagitis   7. Sensorineural hearing loss (SNHL) of both ears   8. Primary osteoarthritis of right knee   9. Primary osteoarthritis of left knee   10. Other abnormal glucose   11. Recurrent major depressive disorder, in partial remission ([place])   12. Alcohol-induced insomnia ([place])   13. Alcohol abuse, daily use   14. Discoid lupus erythematosus   15. Chronic pain syndrome   16. Vitamin D deficiency   17. Hypercholesterolemia   18. Screening for prostate cancer   19. Dysuria    -anticipatory guidance --- decrease alcohol intake; start exercising; recommend weight loss and daily exercise. -colonoscopy UTD; s/p flu vaccine.  -independent with ADLs. -no advanced directives; desires FULL CODE; encourage patient to create advanced directives in the upcoming year. -increase Metoprolol ER to 50mg  daily for improved BP control.  -known hearing loss; cannot afford hearing aides; independent with ADLs; moderate fall risk due to DDD lumbar and osteoarthritis knees and chronic pain; undergoing treatment for depression and stable.  -treat dysuria with Cipro for two weeks for presumed prostatitis;  send urine culture.   Orders Placed This Encounter  Procedures  . Urine  culture  . Flu Vaccine QUAD 36+ mos IM  . CBC with Differential/Platelet  . Comprehensive metabolic panel    Order Specific Question:   Has the patient fasted?    Answer:   Yes  . Lipid panel    Order Specific Question:   Has the patient fasted?    Answer:   Yes  . Hemoglobin A1c  . PSA, Medicare  . POCT urinalysis dipstick  . EKG 12-Lead   No orders of the defined types were placed in this encounter.   No Follow-up on file.   Reiko Vinje Elayne Guerin, M.D. [facility] [address] [place], [place]  [address] [phone] phone [phone] fax

## 2016-09-03 LAB — HEMOGLOBIN A1C
HEMOGLOBIN A1C: 5.7 % — AB (ref <5.7)
Mean Plasma Glucose: 117 mg/dL

## 2016-09-04 LAB — URINE CULTURE

## 2016-09-24 ENCOUNTER — Ambulatory Visit: Payer: Medicare Other | Admitting: Rheumatology

## 2016-10-05 NOTE — Progress Notes (Deleted)
*IMAGE* Office Visit Note  Patient: David Weaver             Date of Birth: [birth year]           MRN: [id number]             PCP: Reginia Forts, MD Referring: Wardell Honour, MD Visit Date: 10/06/2016 Occupation:@GUAROCC @    Subjective:  No chief complaint on file. Follow-up on autoimmune disease, high risk prescription, fibromyalgia syndrome, fatigue, insomnia.  History of Present Illness: David Weaver is a 65 y.o. male who gives a history on 02/12/2016 of doing well with the autoimmune disease.  No fatigue, no rash, no joint pain.  Taking Plaquenil 200 mg twice a day.  No flare.  Fibromyalgia, however, he rates as 5 on a scale of 0 to 10 as well as his fatigue is rated 5.  He does have ischial bursa pain that started just a week ago, and he is not able to sit comfortably.  ON last visit in [place], we planned the following:   IMPRESSION/PLAN:  1.  Autoimmune disease.  Positive ANA, Ro, and La.  No synovitis on the examination today. 2.  Fibromyalgia syndrome.  Active disease with generalized pain and positive tender points.   3.  High-risk prescription.  On Plaquenil 200 mg b.i.d.  Eye exam was normal on 11/07/2015. 4.  Left ischial bursitis.  PROCEDURE:  The patient's clinical condition is marked by substantial pain and/or significant functional disability. Other conservative therapy has not provided relief, is contraindicated, or not appropriate. There is a reasonable likelihood that injection will significantly improve the patient's pain and/or functional impairment.  After informed consent was obtained, Dr. Estanislado Pandy prepped the area and injected it with 30 mg Kenalog mixed with 1 mL of 1% lidocaine.  Patient tolerated the procedure well.  There were no complications.  Patient states that he had trouble sitting secondary to the pain in the ischial bursal area. 5.  Right shoulder joint with decreased range of motion despite surgery by orthopedist in September 2016. 6.  Refill Plaquenil 200  mg b.i.d., a 90-day supply with 1 refill. 7.  Add urine every 4 months and then do CBC with diff, CMP with GFR every 4 months starting in March.  The last one was done in November. 8.  Return to clinic in 6 months.  Note that patient is stable overall so coming back every 6 months is fine.   Today, ***   Activities of Daily Living:  Patient reports morning stiffness for 15 minutes.   Patient Reports nocturnal pain.  Difficulty dressing/grooming: Reports Difficulty climbing stairs: Reports Difficulty getting out of chair: Reports Difficulty using hands for taps, buttons, cutlery, and/or writing: Reports   Review of Systems  Constitutional: Positive for fatigue.  HENT: Negative for mouth sores and mouth dryness.   Eyes: Negative for dryness.  Respiratory: Negative for shortness of breath.   Gastrointestinal: Negative for constipation and diarrhea.  Musculoskeletal: Positive for myalgias and myalgias.  Skin: Negative for sensitivity to sunlight.  Neurological: Negative for memory loss.  Psychiatric/Behavioral: Positive for sleep disturbance.    PMFS History:  Patient Active Problem List   Diagnosis Date Noted  . Paroxysmal atrial fibrillation ([place]) 02/19/2016  . Chronic pain syndrome 02/12/2015  . Annual physical exam 02/28/2013  . Left knee DJD   . Allergic rhinitis   . Osteoarthrosis, unspecified whether generalized or localized, lower leg   . Alcohol abuse, daily use   . Esophageal  reflux   . Lupus erythematosus   . Hearing loss   . Insomnia, unspecified   . Right knee DJD   . Unspecified vitamin D deficiency   . Essential hypertension, benign 11/01/2012  . Other abnormal glucose 11/01/2012  . Pre-operative clearance 11/01/2012  . Depression 11/01/2012  . Need for prophylactic vaccination and inoculation against influenza 11/01/2012  . Vitamin D deficiency 11/01/2012  . Insomnia 11/01/2012    Past Medical History:  Diagnosis Date  . Alcohol abuse, daily use   .  Allergic rhinitis, cause unspecified   . Dysthymic disorder   . Esophageal reflux   . Essential hypertension, benign   . Fibromyalgia    followed by [name] every six months.  . Impotence of organic origin   . Insomnia, unspecified   . Left knee DJD   . Lupus erythematosus   . Osteoarthrosis, unspecified whether generalized or localized, lower leg   . Other abnormal glucose   . Other specified disorder of male genital organs(608.89)   . Unspecified hearing loss   . Unspecified vitamin D deficiency     Family History  Problem Relation Age of Onset  . Alzheimer's disease Mother   . Osteoporosis Mother   . Heart disease Father 41    AMI  . Alcohol abuse Father   . Cancer Brother     colon cancer  . Diabetes Brother   . Kidney failure Brother    Past Surgical History:  Procedure Laterality Date  . CARPAL TUNNEL RELEASE     bilateral  . CHOLECYSTECTOMY    . COLONOSCOPY  03/01/2014   two polyps; Dr. Benson Norway.  Repeat 5 years.  [name] GANGLION CYST EXCISION  1980   Left wrist  . JOINT REPLACEMENT  01/2013   right knee, left knee. [facility].  [name] KNEE ARTHROSCOPY  2008   Right  . lumbar spine epidural  2009   [facility]  . ROTATOR CUFF REPAIR  10/2008   right  . TOTAL KNEE ARTHROPLASTY  12/27/2012   Procedure: TOTAL KNEE ARTHROPLASTY;  Surgeon: [name], MD;  Location: [facility];  Service: Orthopedics;  Laterality: Right;  RIGHT ARTHROPLASTY KNEE MEDIAL AND LATERAL COMPARTMENTS WITH PATELLA RESURFACING, RIGHT TOTAL KNEE REPLACEMENT  . TOTAL KNEE ARTHROPLASTY Left 01/31/2013   Procedure: TOTAL KNEE ARTHROPLASTY;  Surgeon: [name], MD;  Location: [facility];  Service: Orthopedics;  Laterality: Left;   Social History   Social History Narrative   Guns in the home stored in locked cabinet.       Caffeine use: 1 serving/ day.     Marital status:  Married x 25 years, third marriage, happily married.      Children: one son (4); one step-daughter; two grandsons      Lives:  with wife. Mother-in-law going to move in in 2017.      Employment:  Works at [employer] on [employer]; [employer] work; happy; 25 hours per week.      Tobacco: none currently; quit 2013.  Smoked x 40 years.      Alcohol:  2 beers per night in 2017; much heavier use in fifties.      Drugs: none      Exercise:  None in 2017.      Seatbelt: 100%      ADLs: independent with ADLs in 2017; no assistant devices      Advanced Directives: none in 2017; FULL CODE in 2017; no prolonged measures     Objective:  Vital Signs: There were no vitals taken for this visit.   Physical Exam  Constitutional: He is oriented to person, place, and time. He appears well-developed and well-nourished.  HENT:  Head: Normocephalic and atraumatic.  Eyes: Conjunctivae and EOM are normal. Pupils are equal, round, and reactive to light.  Neck: Normal range of motion. Neck supple.  Cardiovascular: Normal rate, regular rhythm and normal heart sounds.  Exam reveals no gallop and no friction rub.   No murmur heard. Pulmonary/Chest: Effort normal and breath sounds normal. No respiratory distress. He has no wheezes. He has no rales. He exhibits no tenderness.  Abdominal: Soft. He exhibits no distension and no mass. There is no tenderness. There is no guarding.  Musculoskeletal: Normal range of motion.  Lymphadenopathy:    He has no cervical adenopathy.  Neurological: He is alert and oriented to person, place, and time. He exhibits normal muscle tone. Coordination normal.  Skin: Skin is warm and dry. Capillary refill takes less than 2 seconds. No rash noted.  Psychiatric: He has a normal mood and affect. His behavior is normal. Judgment and thought content normal.  Nursing note and vitals reviewed.    Musculoskeletal Exam:  Full range of motion of all joints Strength is equal and strong bilaterally Fiber myalgia tender points are 18 out of 18 positive  CDAI Exam: No CDAI exam completed.    Investigation: Findings:    plaquenil eye exam 11/07/15  ======================= Labs from 06/24/2016 show CMP with GFR normal except nonfasting glucose at 126 CBC with differential is normal Urinalysis is normal   Labs from 10/09/2015 show CMP with GFR normal.  CBC with diff is normal.  Urinalysis is normal.  Vitamin D is normal at 41. =======================    Imaging: No results found.  Speciality Comments: No specialty comments available.    Procedures:  No procedures performed Allergies: Patient has no known allergies.   Assessment / Plan: Visit Diagnoses: Autoimmune disease ([place])  High risk medications (not anticoagulants) long-term use - PLQ 200mg  BIDPLQ eye exam normal dec 2016  Fibromyalgia  Fatigue, unspecified type  Insomnia, unspecified type   ****CBC with differential, CMP with GFR, urinalysis do today and again in 4 months ***Return to clinic in 4 months  ***Plaquenil eye exam due December 2017   Orders: No orders of the defined types were placed in this encounter.  No orders of the defined types were placed in this encounter.   Face-to-face time spent with patient was *** minutes. 50% of time was spent in counseling and coordination of care.  Follow-Up Instructions: No Follow-up on file.

## 2016-10-06 ENCOUNTER — Ambulatory Visit: Payer: Medicare Other | Admitting: Rheumatology

## 2016-10-07 ENCOUNTER — Telehealth: Payer: Self-pay | Admitting: Cardiovascular Disease

## 2016-10-07 NOTE — Telephone Encounter (Signed)
NEw message  Patient calling the office for samples of medication:   1.  What medication and dosage are you requesting samples for? Xarelto 20mg   2.  Are you currently out of this medication? Per pt wife pt is out of med

## 2016-10-08 MED ORDER — RIVAROXABAN 20 MG PO TABS: 20.0000 mg | ORAL_TABLET | Freq: Every day | ORAL | 5 refills | Status: DC

## 2016-10-08 NOTE — Telephone Encounter (Signed)
Rx has been sent to the pharmacy electronically. ° °

## 2016-10-08 NOTE — Telephone Encounter (Signed)
Samples at the front desk  Wife notified

## 2016-10-08 NOTE — Telephone Encounter (Signed)
Follow up       *STAT* If patient is at the pharmacy, call can be transferred to refill team.   1. Which medications need to be refilled? (please list name of each medication and dose if known)  Xarelto  2. Which pharmacy/location (including street and city if local pharmacy) is medication to be sent to? [facility] pharmacy for pt--not picking [facility] pres at [facility] 3. Do they need a 30 day or 90 day supply? 30 day supply

## 2016-10-13 DIAGNOSIS — M47816 Spondylosis without myelopathy or radiculopathy, lumbar region: Secondary | ICD-10-CM | POA: Diagnosis not present

## 2016-10-13 DIAGNOSIS — M47812 Spondylosis without myelopathy or radiculopathy, cervical region: Secondary | ICD-10-CM | POA: Diagnosis not present

## 2016-10-13 DIAGNOSIS — M150 Primary generalized (osteo)arthritis: Secondary | ICD-10-CM | POA: Diagnosis not present

## 2016-10-13 DIAGNOSIS — Z79891 Long term (current) use of opiate analgesic: Secondary | ICD-10-CM | POA: Diagnosis not present

## 2016-10-14 ENCOUNTER — Ambulatory Visit (INDEPENDENT_AMBULATORY_CARE_PROVIDER_SITE_OTHER): Payer: Medicare Other | Admitting: Rheumatology

## 2016-10-14 ENCOUNTER — Encounter: Payer: Self-pay | Admitting: Rheumatology

## 2016-10-14 VITALS — BP 145/75 | HR 83 | Resp 14 | Ht 66.25 in | Wt 190.0 lb

## 2016-10-14 DIAGNOSIS — M25511 Pain in right shoulder: Secondary | ICD-10-CM | POA: Diagnosis not present

## 2016-10-14 DIAGNOSIS — M359 Systemic involvement of connective tissue, unspecified: Secondary | ICD-10-CM | POA: Diagnosis not present

## 2016-10-14 DIAGNOSIS — Z79899 Other long term (current) drug therapy: Secondary | ICD-10-CM | POA: Diagnosis not present

## 2016-10-14 DIAGNOSIS — M797 Fibromyalgia: Secondary | ICD-10-CM | POA: Diagnosis not present

## 2016-10-14 DIAGNOSIS — D8989 Other specified disorders involving the immune mechanism, not elsewhere classified: Secondary | ICD-10-CM

## 2016-10-14 LAB — CBC WITH DIFFERENTIAL/PLATELET
BASOS PCT: 1 %
Basophils Absolute: 54 cells/uL (ref 0–200)
EOS PCT: 4 %
Eosinophils Absolute: 216 cells/uL (ref 15–500)
HEMATOCRIT: 41.3 % (ref 38.5–50.0)
HEMOGLOBIN: 14.1 g/dL (ref 13.2–17.1)
LYMPHS ABS: 1458 {cells}/uL (ref 850–3900)
Lymphocytes Relative: 27 %
MCH: 29.4 pg (ref 27.0–33.0)
MCHC: 34.1 g/dL (ref 32.0–36.0)
MCV: 86 fL (ref 80.0–100.0)
MONO ABS: 540 {cells}/uL (ref 200–950)
MPV: 9.6 fL (ref 7.5–12.5)
Monocytes Relative: 10 %
NEUTROS ABS: 3132 {cells}/uL (ref 1500–7800)
Neutrophils Relative %: 58 %
Platelets: 284 10*3/uL (ref 140–400)
RBC: 4.8 MIL/uL (ref 4.20–5.80)
RDW: 13.8 % (ref 11.0–15.0)
WBC: 5.4 10*3/uL (ref 3.8–10.8)

## 2016-10-14 LAB — COMPLETE METABOLIC PANEL WITH GFR
ALBUMIN: 4.1 g/dL (ref 3.6–5.1)
ALK PHOS: 75 U/L (ref 40–115)
ALT: 26 U/L (ref 9–46)
AST: 21 U/L (ref 10–35)
BILIRUBIN TOTAL: 0.4 mg/dL (ref 0.2–1.2)
BUN: 16 mg/dL (ref 7–25)
CALCIUM: 8.7 mg/dL (ref 8.6–10.3)
CO2: 25 mmol/L (ref 20–31)
CREATININE: 0.75 mg/dL (ref 0.70–1.25)
Chloride: 104 mmol/L (ref 98–110)
GFR, Est African American: >89 mL/min (ref >=60)
GFR, Est Non African American: >89 mL/min (ref >=60)
GLUCOSE: 82 mg/dL (ref 65–99)
POTASSIUM: 3.6 mmol/L (ref 3.5–5.3)
SODIUM: 138 mmol/L (ref 135–146)
TOTAL PROTEIN: 6.1 g/dL (ref 6.1–8.1)

## 2016-10-14 MED ORDER — HYDROXYCHLOROQUINE SULFATE 200 MG PO TABS: 200.0000 mg | ORAL_TABLET | Freq: Two times a day (BID) | ORAL | 1 refills | Status: DC

## 2016-10-14 NOTE — Progress Notes (Signed)
Office Visit Note  Patient: David Weaver             Date of Birth: 1951/07/09           MRN: [id number]             PCP: Reginia Forts, MD Referring: Wardell Honour, MD Visit Date: 10/14/2016 Occupation: @GUAROCC @    Subjective:  Follow-up Follow-up on autoimmune disease, fibromyalgia, high risk prescription.  History of Present Illness: David Weaver is a 65 y.o. male  Last seen in our office 02/12/2016. For autoimmune disease. He was not having any synovitis. He was stable. His fibromyalgia was rated 5 on a scale of 0-10. He is taking Plaquenil 200 mg twice a day and doing well. His Plaquenil eye exam was due December 2017 since the last one was normal on December 2016.  Today the patient states: That he is doing well overall. No complaint. His fibromyalgia is rated 5 on a scale of 0-10 again.  He has autoimmune disease but it is stable. No synovitis.  He has a history of bilateral total knee replacement which are also doing well.  He does have some left muscular calf pain. It is not consistent with DVT. He states but it's going on for at least about 2 weeks. He rated the pain as a 5 but it improved over time. At this time, the calf diameter on the left and right appears similar. Patient's pain is improved over time. The pain actually starts in the left upper hip and radiates down to the leg to the left calf. Since it's not consistent with DVT, we will do Voltaren gel at this time.  Activities of Daily Living:  Patient reports morning stiffness for 15 minutes.   Patient Denies nocturnal pain.  Difficulty dressing/grooming: Denies Difficulty climbing stairs: Denies Difficulty getting out of chair: Denies Difficulty using hands for taps, buttons, cutlery, and/or writing: Denies   Review of Systems  Constitutional: Positive for fatigue.  HENT: Negative for mouth sores and mouth dryness.   Eyes: Negative for dryness.  Respiratory: Negative for shortness of breath.     Gastrointestinal: Negative for constipation and diarrhea.  Musculoskeletal: Positive for myalgias and myalgias.  Skin: Negative for sensitivity to sunlight.  Neurological: Negative for memory loss.  Psychiatric/Behavioral: Positive for sleep disturbance.    PMFS History:  Patient Active Problem List   Diagnosis Date Noted  . Paroxysmal atrial fibrillation ([place]) 02/19/2016  . Chronic pain syndrome 02/12/2015  . Annual physical exam 02/28/2013  . Left knee DJD   . Allergic rhinitis   . Osteoarthrosis, unspecified whether generalized or localized, lower leg   . Alcohol abuse, daily use   . Esophageal reflux   . Lupus erythematosus   . Hearing loss   . Insomnia, unspecified   . Right knee DJD   . Unspecified vitamin D deficiency   . Essential hypertension, benign 11/01/2012  . Other abnormal glucose 11/01/2012  . Pre-operative clearance 11/01/2012  . Depression 11/01/2012  . Need for prophylactic vaccination and inoculation against influenza 11/01/2012  . Vitamin D deficiency 11/01/2012  . Insomnia 11/01/2012    Past Medical History:  Diagnosis Date  . Alcohol abuse, daily use   . Allergic rhinitis, cause unspecified   . Dysthymic disorder   . Esophageal reflux   . Essential hypertension, benign   . Fibromyalgia    followed by [name] every six months.  . Impotence of organic origin   . Insomnia, unspecified   .  Left knee DJD   . Lupus erythematosus   . Osteoarthrosis, unspecified whether generalized or localized, lower leg   . Other abnormal glucose   . Other specified disorder of male genital organs(608.89)   . Unspecified hearing loss   . Unspecified vitamin D deficiency     Family History  Problem Relation Age of Onset  . Alzheimer's disease Mother   . Osteoporosis Mother   . Heart disease Father 84    AMI  . Alcohol abuse Father   . Cancer Brother     colon cancer  . Diabetes Brother   . Kidney failure Brother    Past Surgical History:  Procedure  Laterality Date  . CARPAL TUNNEL RELEASE     bilateral  . CHOLECYSTECTOMY    . COLONOSCOPY  03/01/2014   two polyps; Dr. Benson Norway.  Repeat 5 years.  [name] GANGLION CYST EXCISION  1980   Left wrist  . JOINT REPLACEMENT  01/2013   right knee, left knee. [facility].  [name] KNEE ARTHROSCOPY  2008   Right  . lumbar spine epidural  2009   [facility]  . ROTATOR CUFF REPAIR  10/2008   right  . TOTAL KNEE ARTHROPLASTY  12/27/2012   Procedure: TOTAL KNEE ARTHROPLASTY;  Surgeon: [name], MD;  Location: [facility];  Service: Orthopedics;  Laterality: Right;  RIGHT ARTHROPLASTY KNEE MEDIAL AND LATERAL COMPARTMENTS WITH PATELLA RESURFACING, RIGHT TOTAL KNEE REPLACEMENT  . TOTAL KNEE ARTHROPLASTY Left 01/31/2013   Procedure: TOTAL KNEE ARTHROPLASTY;  Surgeon: [name], MD;  Location: [facility];  Service: Orthopedics;  Laterality: Left;   Social History   Social History Narrative   Guns in the home stored in locked cabinet.       Caffeine use: 1 serving/ day.     Marital status:  Married x 25 years, third marriage, happily married.      Children: one son (16); one step-daughter; two grandsons      Lives: with wife. Mother-in-law going to move in in 2017.      Employment:  Works at [employer] on [employer]; [employer] work; happy; 25 hours per week.      Tobacco: none currently; quit 2013.  Smoked x 40 years.      Alcohol:  2 beers per night in 2017; much heavier use in fifties.      Drugs: none      Exercise:  None in 2017.      Seatbelt: 100%      ADLs: independent with ADLs in 2017; no assistant devices      Advanced Directives: none in 2017; FULL CODE in 2017; no prolonged measures     Objective: Vital Signs: BP (!) 145/75 (BP Location: Left Arm, Patient Position: Sitting, Cuff Size: Large)   Pulse 83   Resp 14   Ht 5' 6.25" (1.683 m)   Wt 190 lb (86.2 kg)   BMI 30.44 kg/m    Physical Exam  Constitutional: He is oriented to person, place, and time. He appears well-developed and  well-nourished.  HENT:  Head: Normocephalic and atraumatic.  Eyes: Conjunctivae and EOM are normal. Pupils are equal, round, and reactive to light.  Neck: Normal range of motion. Neck supple.  Cardiovascular: Normal rate, regular rhythm and normal heart sounds.  Exam reveals no gallop and no friction rub.   No murmur heard. Pulmonary/Chest: Effort normal and breath sounds normal. No respiratory distress. He has no wheezes. He has no rales. He exhibits no  tenderness.  Abdominal: Soft. He exhibits no distension and no mass. There is no tenderness. There is no guarding.  Musculoskeletal: Normal range of motion.  Lymphadenopathy:    He has no cervical adenopathy.  Neurological: He is alert and oriented to person, place, and time. He exhibits normal muscle tone. Coordination normal.  Skin: Skin is warm and dry. Capillary refill takes less than 2 seconds. No rash noted.  Psychiatric: He has a normal mood and affect. His behavior is normal. Judgment and thought content normal.  Vitals reviewed.    Musculoskeletal Exam: Full range of motion of all joints Grip strength is equal and strong bilaterally Fibromyalgia tender points are 18 out of 18 positive on the last visit, today they're 6 out of 18. Most of this pain is to the clavicular area and the parasternal area.  CDAI Exam: CDAI Homunculus Exam:   Joint Counts:  CDAI Tender Joint count: 0 CDAI Swollen Joint count: 0     Investigation: No additional findings. Labs from 09/02/2016 are in [place]. CMP with GFR is within normal limits with creatinine low at 0.66 and glucose nonfasting is elevated at 105. Both are within normal limits and we can monitor.  CBC with differential is normal. From 09/02/2016.  Imaging: No results found.  Speciality Comments: No specialty comments available.    Procedures:  No procedures performed Allergies: Patient has no known allergies.   Assessment / Plan: Visit Diagnoses: Autoimmune disorder  ([place]) - +ANA ; +Ro ; +La ;   High risk medications (not anticoagulants) long-term use - PLQ 200 BID ; PLQ EYE EXAM NORMAL 11/07/2015 ;  - Plan: CBC with Differential/Platelet, COMPLETE METABOLIC PANEL WITH GFR, Urinalysis, Routine w reflex microscopic (not at [facility])  Fibromyalgia  Right shoulder pain, unspecified chronicity   Refill Plaquenil 2 mg twice a day; ninety-day supply with one refill ; patient has a fair amount of medication at home but it will last him for about 2-3 weeks. The Thanksgiving holidays coming up I will refill his medication so he does not have to contact our office during the holidays.  Today, I Ordered CBC with differential CMP with GFR urinalysis every 4 months starting February 2018  Patient has adequate amount of Voltaren gel and Robaxin at home. He does not require any other meds to be refilled at this time.  He has a history of bilateral total knee replacement. They're doing well.  Return to clinic in 5 months.  If patient's left calf area is not doing well in a few weeks, he should go to his PCP.   Orders: Orders Placed This Encounter  Procedures  . CBC with Differential/Platelet  . COMPLETE METABOLIC PANEL WITH GFR  . Urinalysis, Routine w reflex microscopic (not at [facility])   Meds ordered this encounter  Medications  . hydroxychloroquine (PLAQUENIL) 200 MG tablet    Sig: Take 1 tablet (200 mg total) by mouth 2 (two) times daily.    Dispense:  180 tablet    Refill:  1    Order Specific Question:   Supervising Provider    Answer:   Bo Merino [phone]    Face-to-face time spent with patient was 30 minutes. 50% of time was spent in counseling and coordination of care.  Follow-Up Instructions: No Follow-up on file.   Eliezer Lofts, PA-C   I examined and evaluated the patient with Eliezer Lofts PA. The plan of care was discussed as noted above.  Bo Merino, MD

## 2016-10-15 LAB — URINALYSIS, ROUTINE W REFLEX MICROSCOPIC
BILIRUBIN URINE: NEGATIVE
GLUCOSE, UA: NEGATIVE
Hgb urine dipstick: NEGATIVE
KETONES UR: NEGATIVE
Leukocytes, UA: NEGATIVE
Nitrite: NEGATIVE
PROTEIN: NEGATIVE
Specific Gravity, Urine: 1.02 (ref 1.001–1.035)
pH: 5.5 (ref 5.0–8.0)

## 2016-10-15 NOTE — Progress Notes (Signed)
#  1 urinalysis is normal#2 CMP with GFR and CBC with differential are normalSend copies of labs to PCP and notify patient

## 2016-10-16 ENCOUNTER — Telehealth: Payer: Self-pay | Admitting: Radiology

## 2016-10-16 NOTE — Telephone Encounter (Signed)
I have called patient to advise labs are normal  

## 2016-10-16 NOTE — Telephone Encounter (Signed)
-----   Message from [place], [place] sent at 10/15/2016  3:05 PM EST ----- #1 urinalysis is normal#2 CMP with GFR and CBC with differential are normalSend copies of labs to PCP and notify patient

## 2016-10-29 ENCOUNTER — Other Ambulatory Visit: Payer: Self-pay | Admitting: Family Medicine

## 2016-10-30 ENCOUNTER — Ambulatory Visit (INDEPENDENT_AMBULATORY_CARE_PROVIDER_SITE_OTHER): Payer: Medicare Other | Admitting: Family Medicine

## 2016-10-30 ENCOUNTER — Ambulatory Visit (INDEPENDENT_AMBULATORY_CARE_PROVIDER_SITE_OTHER): Payer: Medicare Other

## 2016-10-30 VITALS — BP 130/80 | HR 84 | Temp 97.8°F | Resp 16 | Ht 66.25 in | Wt 184.2 lb

## 2016-10-30 DIAGNOSIS — R1084 Generalized abdominal pain: Secondary | ICD-10-CM | POA: Diagnosis not present

## 2016-10-30 DIAGNOSIS — S39011A Strain of muscle, fascia and tendon of abdomen, initial encounter: Secondary | ICD-10-CM | POA: Diagnosis not present

## 2016-10-30 DIAGNOSIS — R1010 Upper abdominal pain, unspecified: Secondary | ICD-10-CM | POA: Diagnosis not present

## 2016-10-30 LAB — COMPREHENSIVE METABOLIC PANEL
ALK PHOS: 95 U/L (ref 40–115)
ALT: 50 U/L — ABNORMAL HIGH (ref 9–46)
AST: 29 U/L (ref 10–35)
Albumin: 4.5 g/dL (ref 3.6–5.1)
BUN: 13 mg/dL (ref 7–25)
CO2: 23 mmol/L (ref 20–31)
Calcium: 9.3 mg/dL (ref 8.6–10.3)
Chloride: 106 mmol/L (ref 98–110)
Creat: 0.6 mg/dL — ABNORMAL LOW (ref 0.70–1.25)
GLUCOSE: 113 mg/dL — AB (ref 65–99)
POTASSIUM: 4.1 mmol/L (ref 3.5–5.3)
SODIUM: 140 mmol/L (ref 135–146)
TOTAL PROTEIN: 6.7 g/dL (ref 6.1–8.1)
Total Bilirubin: 0.7 mg/dL (ref 0.2–1.2)

## 2016-10-30 LAB — POCT URINALYSIS DIP (MANUAL ENTRY)
BILIRUBIN UA: NEGATIVE
BILIRUBIN UA: NEGATIVE
Blood, UA: NEGATIVE
GLUCOSE UA: NEGATIVE
LEUKOCYTES UA: NEGATIVE
Nitrite, UA: NEGATIVE
PROTEIN UA: NEGATIVE
SPEC GRAV UA: 1.02
Urobilinogen, UA: 0.2
pH, UA: 7

## 2016-10-30 LAB — CBC WITH DIFFERENTIAL/PLATELET
BASOS ABS: 52 {cells}/uL (ref 0–200)
Basophils Relative: 1 %
EOS PCT: 4 %
Eosinophils Absolute: 208 cells/uL (ref 15–500)
HEMATOCRIT: 46.1 % (ref 38.5–50.0)
HEMOGLOBIN: 15.5 g/dL (ref 13.2–17.1)
LYMPHS ABS: 1144 {cells}/uL (ref 850–3900)
Lymphocytes Relative: 22 %
MCH: 29.7 pg (ref 27.0–33.0)
MCHC: 33.6 g/dL (ref 32.0–36.0)
MCV: 88.3 fL (ref 80.0–100.0)
MONO ABS: 520 {cells}/uL (ref 200–950)
MPV: 9.7 fL (ref 7.5–12.5)
Monocytes Relative: 10 %
NEUTROS ABS: 3276 {cells}/uL (ref 1500–7800)
Neutrophils Relative %: 63 %
Platelets: 259 10*3/uL (ref 140–400)
RBC: 5.22 MIL/uL (ref 4.20–5.80)
RDW: 13.8 % (ref 11.0–15.0)
WBC: 5.2 10*3/uL (ref 3.8–10.8)

## 2016-10-30 NOTE — Patient Instructions (Addendum)
  Recommend taking muscle relaxer three times daily for abdominal wall sprain.   IF you received an x-ray today, you will receive an invoice from [facility]. Please contact [facility] at [phone] with questions or concerns regarding your invoice.   IF you received labwork today, you will receive an invoice from [employer]. Please contact [facility] at [phone] with questions or concerns regarding your invoice.   Our billing staff will not be able to assist you with questions regarding bills from these companies.  You will be contacted with the lab results as soon as they are available. The fastest way to get your results is to activate your My Chart account. Instructions are located on the last page of this paperwork. If you have not heard from [place] regarding the results in 2 weeks, please contact this office.

## 2016-10-30 NOTE — Progress Notes (Signed)
Subjective:    Patient ID: LITO PENT, male    DOB: [birth year], 65 y.o.   MRN: [id number]  10/30/2016  Abdominal Pain (UPPER x on/off for 1 month)   HPI This 65 y.o. male presents for evaluation of B rib pain.  Onset one month ago.  Last week when made appointment, pain severe; could not bend over. Not sure if gas.  Eats gas pills without improvement.  Eating does not seem to trigger.  Intermittent nausea; will eat with resolution Nausea not associated with pain.  No vomiting; intermittent diarrhea chronic; no constipation; no bloody stools; no melena.  No hematuria.  Bending over will trigger pain; only notices during the day.  No nighttime awakening. Feels under ribs.  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  Working .  Four days ago pain was terrible.  No belching/indigestion. Gets worse in tight places inside the car.  Current pain 2-3/10; four days ago, pain 10/10.    Review of Systems  Constitutional: Negative for chills, diaphoresis, fatigue and fever.  Respiratory: Negative for cough, shortness of breath, wheezing and stridor.   Cardiovascular: Negative for chest pain, palpitations and leg swelling.  Gastrointestinal: Positive for abdominal pain and nausea. Negative for abdominal distention, anal bleeding, blood in stool, constipation, diarrhea, rectal pain and vomiting.  Genitourinary: Negative for dysuria, flank pain, genital sores, hematuria, penile swelling, scrotal swelling, testicular pain and urgency.  Musculoskeletal: Positive for arthralgias and myalgias.    Past Medical History:  Diagnosis Date  . Alcohol abuse, daily use   . Allergic rhinitis, cause unspecified   . Dysthymic disorder   . Esophageal reflux   . Essential hypertension, benign   . Fibromyalgia    followed by [name] every six months.  . Impotence of organic origin   . Insomnia, unspecified   . Left knee DJD   . Lupus erythematosus   . Osteoarthrosis, unspecified whether generalized or  localized, lower leg   . Other abnormal glucose   . Other specified disorder of male genital organs(608.89)   . Unspecified hearing loss   . Unspecified vitamin D deficiency    Past Surgical History:  Procedure Laterality Date  . CARPAL TUNNEL RELEASE     bilateral  . CHOLECYSTECTOMY    . COLONOSCOPY  03/01/2014   two polyps; Dr. Benson Norway.  Repeat 5 years.  [name] GANGLION CYST EXCISION  1980   Left wrist  . JOINT REPLACEMENT  01/2013   right knee, left knee. [facility].  [name] KNEE ARTHROSCOPY  2008   Right  . lumbar spine epidural  2009   [facility]  . ROTATOR CUFF REPAIR  10/2008   right  . TOTAL KNEE ARTHROPLASTY  12/27/2012   Procedure: TOTAL KNEE ARTHROPLASTY;  Surgeon: [name], MD;  Location: [facility];  Service: Orthopedics;  Laterality: Right;  RIGHT ARTHROPLASTY KNEE MEDIAL AND LATERAL COMPARTMENTS WITH PATELLA RESURFACING, RIGHT TOTAL KNEE REPLACEMENT  . TOTAL KNEE ARTHROPLASTY Left 01/31/2013   Procedure: TOTAL KNEE ARTHROPLASTY;  Surgeon: [name], MD;  Location: [facility];  Service: Orthopedics;  Laterality: Left;   No Known Allergies Current Outpatient Prescriptions  Medication Sig Dispense Refill  . amLODipine (NORVASC) 10 MG tablet Take 1 tablet (10 mg total) by mouth daily. 90 tablet 3  . Cyanocobalamin (VITAMIN B12 PO) Take by mouth daily.    . DULoxetine (CYMBALTA) 60 MG capsule Take 1 capsule (60 mg total) by mouth 2 (two) times daily. 180 capsule 3  .  hydroxychloroquine (PLAQUENIL) 200 MG tablet Take 1 tablet (200 mg total) by mouth 2 (two) times daily. 180 tablet 1  . methocarbamol (ROBAXIN) 500 MG tablet TAKE 1 TO 2 TABLETS BY MOUTH EVERY 8 HOURS AS NEEDED FOR MUSCLEPASMS 60 tablet 5  . metoprolol succinate (TOPROL-XL) 50 MG 24 hr tablet Take 1 tablet (50 mg total) by mouth daily. 90 tablet 3  . omeprazole (PRILOSEC) 20 MG capsule TAKE 1 CAPSULE BY MOUTH DAILY. 90 capsule 3  . oxyCODONE-acetaminophen (PERCOCET/ROXICET) 5-325 MG tablet Take 1 tablet by mouth  every 4 (four) hours as needed. for pain  0  . rivaroxaban (XARELTO) 20 MG TABS tablet Take 1 tablet (20 mg total) by mouth daily with supper. 30 tablet 5  . traZODone (DESYREL) 50 MG tablet TAKE 1 OR 2 TABLETS BY MOUTH AT BEDTIME AS NEEDED FOR SLEEP 180 tablet 3  . Vitamin D, Ergocalciferol, (DRISDOL) 50000 units CAPS capsule TAKE ONE CAPSULE BY MOUTH EVERY 7 DAYS**DUE FOR LAB WORK FOLLOW UP** 12 capsule 0  . VOLTAREN 1 % GEL APPLY 2 TO 4 GRAMS TOPICALLY 4 TIMES DAILY 100 g 4   No current facility-administered medications for this visit.    Social History   Social History  . Marital status: Married    Spouse name: N/A  . Number of children: 1  . Years of education: N/A   Occupational History  . [employer] cars/trucks   Social History Main Topics  . Smoking status: Former Smoker    Packs/day: 2.00    Years: 30.00    Types: Cigarettes    Quit date: 12/28/2008  . Smokeless tobacco: Never Used     Comment: quit 12 /2011  . Alcohol use 6.0 oz/week    6 Cans of beer, 4 Standard drinks or equivalent per week     Comment: 2 drinks of liquor, every afternoon    12/28/2012  wekends 4 5 beers  . Drug use: No  . Sexual activity: Yes    Birth control/ protection: Post-menopausal   Other Topics Concern  . Not on file   Social History Narrative   Guns in the home stored in locked cabinet.       Caffeine use: 1 serving/ day.     Marital status:  Married x 25 years, third marriage, happily married.      Children: one son (71); one step-daughter; two grandsons      Lives: with wife. Mother-in-law going to move in in 2017.      Employment:  Works at [employer] on [employer]; [employer] work; happy; 25 hours per week.      Tobacco: none currently; quit 2013.  Smoked x 40 years.      Alcohol:  2 beers per night in 2017; much heavier use in fifties.      Drugs: none      Exercise:  None in 2017.      Seatbelt: 100%      ADLs: independent with ADLs in 2017; no assistant  devices      Advanced Directives: none in 2017; FULL CODE in 2017; no prolonged measures   Family History  Problem Relation Age of Onset  . Alzheimer's disease Mother   . Osteoporosis Mother   . Heart disease Father 22    AMI  . Alcohol abuse Father   . Cancer Brother     colon cancer  . Diabetes Brother   . Kidney failure Brother  Objective:    BP 130/80 (BP Location: Left Arm, Patient Position: Sitting, Cuff Size: Normal)   Pulse 84   Temp 97.8 F (36.6 C) (Oral)   Resp 16   Ht 5' 6.25" (1.683 m)   Wt 184 lb 3.2 oz (83.6 kg)   SpO2 96%   BMI 29.51 kg/m  Physical Exam  Constitutional: He is oriented to person, place, and time. He appears well-developed and well-nourished. No distress.  HENT:  Head: Normocephalic and atraumatic.  Right Ear: External ear normal.  Left Ear: External ear normal.  Nose: Nose normal.  Mouth/Throat: Oropharynx is clear and moist.  Eyes: Conjunctivae and EOM are normal. Pupils are equal, round, and reactive to light.  Neck: Normal range of motion. Neck supple. Carotid bruit is not present. No thyromegaly present.  Cardiovascular: Normal rate, regular rhythm, normal heart sounds and intact distal pulses.  Exam reveals no gallop and no friction rub.   No murmur heard. Pulmonary/Chest: Effort normal and breath sounds normal. He has no wheezes. He has no rales.  Abdominal: Soft. Bowel sounds are normal. He exhibits no distension, no pulsatile liver, no ascites and no mass. There is tenderness in the right upper quadrant and left upper quadrant. There is no rebound, no guarding and no CVA tenderness. No hernia. Hernia confirmed negative in the ventral area.  Pain reproduced/worsened going from supine to sitting; pain reproduced with lumbar flexion; no pain in abdomen with lumbar extension/rotation side to side/lateral bends.  Lymphadenopathy:    He has no cervical adenopathy.  Neurological: He is alert and oriented to person, place, and time.  No cranial nerve deficit.  Skin: Skin is warm and dry. No rash noted. He is not diaphoretic.  Psychiatric: He has a normal mood and affect. His behavior is normal.  Nursing note and vitals reviewed.  Results for orders placed or performed in visit on 10/30/16  POCT urinalysis dipstick  Result Value Ref Range   Color, UA yellow yellow   Clarity, UA clear clear   Glucose, UA negative negative   Bilirubin, UA negative negative   Ketones, POC UA negative negative   Spec Grav, UA 1.020    Blood, UA negative negative   pH, UA 7.0    Protein Ur, POC negative negative   Urobilinogen, UA 0.2    Nitrite, UA Negative Negative   Leukocytes, UA Negative Negative   Dg Abd Acute W/chest  Result Date: 10/30/2016 CLINICAL DATA:  Upper abdominal pain for 1 month EXAM: DG ABDOMEN ACUTE W/ 1V CHEST COMPARISON:  8/15/ 16 FINDINGS: There is no evidence of dilated bowel loops or free intraperitoneal air. No radiopaque calculi or other significant radiographic abnormality is seen. Heart size and mediastinal contours are within normal limits. Both lungs are clear. IMPRESSION: Negative abdominal radiographs.  No acute cardiopulmonary disease. Electronically Signed   By: Lahoma Crocker M.D.   On: 10/30/2016 10:15       Assessment & Plan:   1. Generalized abdominal pain   2. Strain of abdominal wall, initial encounter    -New onset.  Only other associated symptom is intermittent mild nausea. -position change triggers/worsens abdominal pain; has increased work activities in the past month; consistent with abdominal wall strain. -recommend starting pt's Robaxin tid PRN. Also recommend rest and avoiding repetitive bending/twisting/rotating. -RTC for any GI related symptoms including vomiting, diarrhea, constipation, melena, or bloody stools.  RTC for any GU symptoms including dysuria, frequency, hesitancy.   Orders Placed This Encounter  Procedures  .  DG Abd Acute W/Chest    Standing Status:   Future     Number of Occurrences:   1    Standing Expiration Date:   10/30/2017    Order Specific Question:   Reason for exam:    Answer:   upper abdominal pain B    Order Specific Question:   Preferred imaging location?    Answer:   External  . CBC with Differential/Platelet  . Comprehensive metabolic panel  . POCT urinalysis dipstick   No orders of the defined types were placed in this encounter.   No Follow-up on file.   Maricela Schreur Elayne Guerin, M.D. [facility] [address] [place], [place]  [address] [phone] phone [phone] fax

## 2016-11-02 NOTE — Telephone Encounter (Signed)
Last ov and labs 10/2016 No vit d lab?

## 2016-11-17 ENCOUNTER — Telehealth: Payer: Self-pay | Admitting: Cardiovascular Disease

## 2016-11-17 NOTE — Telephone Encounter (Signed)
Patient's wife notified that no samples are available at this time. Advised to call back later in the week.

## 2016-11-17 NOTE — Telephone Encounter (Signed)
New message  Patient calling the office for samples of medication:   1.  What medication and dosage are you requesting samples for? Xarelto 20mg    2.  Are you currently out of this medication? Per  wife has a week left

## 2016-11-29 ENCOUNTER — Other Ambulatory Visit: Payer: Self-pay

## 2016-11-29 MED ORDER — VITAMIN D (ERGOCALCIFEROL) 1.25 MG (50000 UNIT) PO CAPS: ORAL_CAPSULE | ORAL | 11 refills | Status: DC

## 2016-11-30 ENCOUNTER — Other Ambulatory Visit: Payer: Self-pay | Admitting: Family Medicine

## 2016-12-03 DIAGNOSIS — H2513 Age-related nuclear cataract, bilateral: Secondary | ICD-10-CM | POA: Diagnosis not present

## 2016-12-08 DIAGNOSIS — M150 Primary generalized (osteo)arthritis: Secondary | ICD-10-CM | POA: Diagnosis not present

## 2016-12-08 DIAGNOSIS — Z79891 Long term (current) use of opiate analgesic: Secondary | ICD-10-CM | POA: Diagnosis not present

## 2016-12-08 DIAGNOSIS — M47812 Spondylosis without myelopathy or radiculopathy, cervical region: Secondary | ICD-10-CM | POA: Diagnosis not present

## 2016-12-08 DIAGNOSIS — M47816 Spondylosis without myelopathy or radiculopathy, lumbar region: Secondary | ICD-10-CM | POA: Diagnosis not present

## 2017-01-12 ENCOUNTER — Other Ambulatory Visit: Payer: Self-pay | Admitting: Family Medicine

## 2017-01-22 ENCOUNTER — Other Ambulatory Visit: Payer: Self-pay | Admitting: Family Medicine

## 2017-01-22 NOTE — Telephone Encounter (Signed)
Meds ordered this encounter  Medications  . methocarbamol (ROBAXIN) 500 MG tablet    Sig: TAKE 1 TO 2 TABLETS BY MOUTH EVERY 8 HOURS AS NEEDED FOR MUSCLEPASMS    Dispense:  60 tablet    Refill:  1

## 2017-02-02 DIAGNOSIS — Z79891 Long term (current) use of opiate analgesic: Secondary | ICD-10-CM | POA: Diagnosis not present

## 2017-02-02 DIAGNOSIS — M150 Primary generalized (osteo)arthritis: Secondary | ICD-10-CM | POA: Diagnosis not present

## 2017-02-02 DIAGNOSIS — M47816 Spondylosis without myelopathy or radiculopathy, lumbar region: Secondary | ICD-10-CM | POA: Diagnosis not present

## 2017-02-02 DIAGNOSIS — M47812 Spondylosis without myelopathy or radiculopathy, cervical region: Secondary | ICD-10-CM | POA: Diagnosis not present

## 2017-02-16 ENCOUNTER — Other Ambulatory Visit: Payer: Self-pay | Admitting: Rheumatology

## 2017-02-16 NOTE — Telephone Encounter (Signed)
Last Visit: 10/14/16 Next Visit: 04/13/17 Labs: 10/30/16 AST 50 previous 26 PLQ Eye Exam: 11/07/15 WNL  Okay to refill PLQ?

## 2017-02-16 NOTE — Telephone Encounter (Signed)
Patient needs refill of Plaquenil sent to [facility] on  [address] please.

## 2017-02-17 NOTE — Telephone Encounter (Signed)
Too Soon, he should have refill remaining.

## 2017-02-20 ENCOUNTER — Other Ambulatory Visit: Payer: Self-pay | Admitting: Rheumatology

## 2017-02-20 NOTE — Telephone Encounter (Signed)
David Weaver advised that David Weaver should have a refill at the pharmacy. Advised her to contact the pharmacy and clarify with them. Patient advised to call the office if she is still having trouble with the prescription.

## 2017-02-20 NOTE — Telephone Encounter (Signed)
Patient's wife calling because he is out of PLQ. Will have another eye exam on April 4th to complete.  He went in Jan and insurance paid for only the exam, but the insurance would not pay for everything that Dr. Estanislado Pandy was requesting.  Please call because he is out of Rx completely.

## 2017-02-21 ENCOUNTER — Encounter: Payer: Self-pay | Admitting: Family Medicine

## 2017-03-02 NOTE — Progress Notes (Signed)
Subjective:    Patient ID: David Weaver, male    DOB: Mar 11, 1951, 66 y.o.   MRN: [id number]  03/03/2017  Follow-up (high blood pressure)   HPI This 66 y.o. male presents for six month follow-up of hypertension, anxiety/depression, glucose intolerance, GERD, and fibromyalgia with OA multiple locations/chronic pain syndrome. Increased Metorpolol ER to 50mg  daily for improved blood pressure control at last visit in 08/2016.  Treated with Cipro for acute prostatitis. Urine cx + for enterococcus. Sugar elevated at 113; ALT elevated; advised to avoid alcohol and Tylenol containing products. Drinking 1-2 beers per day.    Wife suffered a recent CVA; pt had to drive wife around for several weeks.  Now going to the [place] in [place].   Abdominal pain: onset every night after eating. Can hurt during the day.  No constant pain; hurts after working during the day.  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  Bending over does not trigger pain any longer.  No specific movement triggers. Sometimes in the morning, pain occurs.  No nausea; no vomiting; no heartburn, indigestion.  Daily bowel movements.  No constipation.  Mostly diarrhea intermittent but this is chronic issue for pateint.  Coffee can trigger diarrhea.  Drinks one cup of coffee per day; will have diarrhea after coffee.  No food triggers to pain.  No hematuria.  No dysuria.  Muscle relaxer helps pain.  Has not tried muscle relaxer during the day.  Gets nauseated when does not eat.  Colonoscopy 2015.  Getting anticoagulant from [facility].  s/p CPE 01/14/17; return in one year or six months.Andy Gauss, MD.  Hypertriglyceridemia: started Fish Oil three daily after triglycerides elevated.  Blurred vision: having intermittent blurred vision at times with intermittent dizziness.  Not sure how often occurs. No associated headache or n/v.  No ataxia; no falls.  No focal weakness; no paresthesias.   Immunization History    Administered Date(s) Administered  . Influenza, Seasonal, Injecte, Preservative Fre 11/01/2012  . Influenza,inj,Quad PF,36+ Mos 11/21/2013, 10/11/2014, 10/03/2015, 09/02/2016  . Influenza-Unspecified 08/20/2009, 08/19/2010, 08/12/2011  . Pneumococcal Conjugate-13 02/12/2015  . Pneumococcal Polysaccharide-23 02/19/2016  . Pneumococcal-Unspecified 03/03/2008  . Td 07/01/2001  . Tdap 02/03/2012  . Zoster 08/12/2011   BP Readings from Last 3 Encounters:  03/03/17 132/80  10/30/16 130/80  10/14/16 (!) 145/75   Wt Readings from Last 3 Encounters:  03/03/17 182 lb (82.6 kg)  10/30/16 184 lb 3.2 oz (83.6 kg)  10/14/16 190 lb (86.2 kg)     Review of Systems  Constitutional: Negative for activity change, appetite change, chills, diaphoresis, fatigue and fever.  Eyes: Positive for visual disturbance. Negative for photophobia.  Respiratory: Negative for cough and shortness of breath.   Cardiovascular: Negative for chest pain, palpitations and leg swelling.  Gastrointestinal: Positive for abdominal pain and diarrhea. Negative for abdominal distention, anal bleeding, blood in stool, constipation, nausea, rectal pain and vomiting.  Endocrine: Negative for cold intolerance, heat intolerance, polydipsia, polyphagia and polyuria.  Genitourinary: Negative for dysuria, flank pain, frequency, hematuria, penile swelling, scrotal swelling, testicular pain and urgency.  Skin: Negative for color change, rash and wound.  Neurological: Positive for dizziness. Negative for tremors, seizures, syncope, facial asymmetry, speech difficulty, weakness, light-headedness, numbness and headaches.  Psychiatric/Behavioral: Negative for dysphoric mood and sleep disturbance. The patient is not nervous/anxious.     Past Medical History:  Diagnosis Date  . Alcohol abuse, daily use   . Allergic rhinitis, cause unspecified   .  Dysthymic disorder   . Esophageal reflux   . Essential hypertension, benign   .  Fibromyalgia    followed by [name] every six months.  . Impotence of organic origin   . Insomnia, unspecified   . Left knee DJD   . Lupus erythematosus   . Osteoarthrosis, unspecified whether generalized or localized, lower leg   . Other abnormal glucose   . Other specified disorder of male genital organs(608.89)   . Unspecified hearing loss   . Unspecified vitamin D deficiency    Past Surgical History:  Procedure Laterality Date  . CARPAL TUNNEL RELEASE     bilateral  . CHOLECYSTECTOMY    . COLONOSCOPY  03/01/2014   two polyps; Dr. Benson Norway.  Repeat 5 years.  [name] GANGLION CYST EXCISION  1980   Left wrist  . JOINT REPLACEMENT  01/2013   right knee, left knee. [facility]ainer.  [name] KNEE ARTHROSCOPY  2008   Right  . lumbar spine epidural  2009   [facility]  . ROTATOR CUFF REPAIR  10/2008   right  . TOTAL KNEE ARTHROPLASTY  12/27/2012   Procedure: TOTAL KNEE ARTHROPLASTY;  Surgeon: [name], MD;  Location: [facility];  Service: Orthopedics;  Laterality: Right;  RIGHT ARTHROPLASTY KNEE MEDIAL AND LATERAL COMPARTMENTS WITH PATELLA RESURFACING, RIGHT TOTAL KNEE REPLACEMENT  . TOTAL KNEE ARTHROPLASTY Left 01/31/2013   Procedure: TOTAL KNEE ARTHROPLASTY;  Surgeon: [name], MD;  Location: [facility];  Service: Orthopedics;  Laterality: Left;   No Known Allergies  Social History   Social History  . Marital status: Married    Spouse name: N/A  . Number of children: 1  . Years of education: N/A   Occupational History  . [employer] cars/trucks   Social History Main Topics  . Smoking status: Former Smoker    Packs/day: 2.00    Years: 30.00    Types: Cigarettes    Quit date: 12/28/2008  . Smokeless tobacco: Never Used     Comment: quit 12 /2011  . Alcohol use 6.0 oz/week    6 Cans of beer, 4 Standard drinks or equivalent per week     Comment: 2 drinks of liquor, every afternoon    12/28/2012  wekends 4 5 beers  . Drug use: No  . Sexual activity: Yes     Birth control/ protection: Post-menopausal   Other Topics Concern  . Not on file   Social History Narrative   Guns in the home stored in locked cabinet.       Caffeine use: 1 serving/ day.     Marital status:  Married x 25 years, third marriage, happily married.      Children: one son (27); one step-daughter; two grandsons      Lives: with wife. Mother-in-law going to move in in 2017.      Employment:  Works at [employer] on [employer]; [employer] work; happy; 25 hours per week.      Tobacco: none currently; quit 2013.  Smoked x 40 years.      Alcohol:  2 beers per night in 2017; much heavier use in fifties.      Drugs: none      Exercise:  None in 2017.      Seatbelt: 100%      ADLs: independent with ADLs in 2017; no assistant devices      Advanced Directives: none in 2017; FULL CODE in 2017; no prolonged measures  Family History  Problem Relation Age of Onset  . Alzheimer's disease Mother   . Osteoporosis Mother   . Heart disease Father 5    AMI  . Alcohol abuse Father   . Cancer Brother     colon cancer  . Diabetes Brother   . Kidney failure Brother        Objective:    BP 132/80 (BP Location: Right Arm, Patient Position: Sitting, Cuff Size: Small)   Pulse 68   Temp 97.9 F (36.6 C) (Oral)   Resp 16   Ht 5' 6.25" (1.683 m)   Wt 182 lb (82.6 kg)   SpO2 95%   BMI 29.15 kg/m  Physical Exam  Constitutional: He is oriented to person, place, and time. He appears well-developed and well-nourished. No distress.  HENT:  Head: Normocephalic and atraumatic.  Right Ear: External ear normal.  Left Ear: External ear normal.  Nose: Nose normal.  Mouth/Throat: Oropharynx is clear and moist.  Eyes: Conjunctivae and EOM are normal. Pupils are equal, round, and reactive to light.  Neck: Normal range of motion. Neck supple. Carotid bruit is not present. No thyromegaly present.  Cardiovascular: Normal rate, regular rhythm, normal heart sounds and intact distal pulses.  Exam reveals  no gallop and no friction rub.   No murmur heard. Pulmonary/Chest: Effort normal and breath sounds normal. He has no wheezes. He has no rales.  Abdominal: Soft. Bowel sounds are normal. He exhibits no distension and no mass. There is no tenderness. There is no rebound and no guarding.  Lymphadenopathy:    He has no cervical adenopathy.  Neurological: He is alert and oriented to person, place, and time. He has normal strength. No cranial nerve deficit. He exhibits normal muscle tone. He displays a negative Romberg sign. Coordination normal.  Skin: Skin is warm and dry. No rash noted. He is not diaphoretic.  Psychiatric: He has a normal mood and affect. His behavior is normal.  Nursing note and vitals reviewed.   Depression screen [facility] 2/9 03/03/2017 10/30/2016 09/02/2016 02/19/2016 10/03/2015  Decreased Interest 0 0 0 0 0  Down, Depressed, Hopeless 0 0 0 0 0  PHQ - 2 Score 0 0 0 0 0   Fall Risk  03/03/2017 10/30/2016 09/02/2016 02/19/2016 05/24/2015  Falls in the past year? No No No Yes Yes  Number falls in past yr: - - - 1 1  Injury with Fall? - - - Yes Yes       Assessment & Plan:   1. Essential hypertension, benign   2. Paroxysmal atrial fibrillation ([name])   3. Acute seasonal allergic rhinitis due to pollen   4. Gastroesophageal reflux disease without esophagitis   5. Other abnormal glucose   6. Recurrent major depressive disorder, in partial remission ([place])   7. Psychophysiological insomnia   8. Chronic pain syndrome   9. Discoid lupus erythematosus   10. Encounter for prostate cancer screening   11. Blurred vision   12. Vitamin D deficiency   13. Hypertriglyceridemia    -persistent abdominal pain without other associated symptoms other than chronic diarrhea and nausea when fasting.  s/p cholecystectomy.  Obtain labs.  s/p colonoscopy in 2015.  May warrant increase in Omeprazole to 40mg  daily.  Previously related to position changes and due to excessive physical work due to employment  yet symptoms now have changed. -new onset vision changes intermittently with dizziness; obtain carotid dopplers; has upcoming appointment with ophthalmology; obtain labs; normal neurological exam. -chronic medical conditions  stable; obtain labs.  Continue chronic medications.   Orders Placed This Encounter  Procedures  . US Carotid Duplex Bilateral    Standing Status:   Future    Standing Expiration Date:   05/03/2018    Order Specific Question:   Reason for exam:    Answer:   blurred vision, dizziness, atrial fibrillation    Order Specific Question:   Preferred imaging location?    Answer:   [facility]  . CBC with Differential/Platelet  . Comprehensive metabolic panel  . Hemoglobin A1c  . POCT urinalysis dipstick   No orders of the defined types were placed in this encounter.   Return in about 6 months (around 09/02/2017) for complete physical examiniation.   David Weaver, M.D. Primary [facility] at [facility] previously [facility] [address] [place], [place]  [address] [phone] phone [phone] fax

## 2017-03-03 ENCOUNTER — Ambulatory Visit (INDEPENDENT_AMBULATORY_CARE_PROVIDER_SITE_OTHER): Payer: Medicare Other | Admitting: Family Medicine

## 2017-03-03 ENCOUNTER — Encounter: Payer: Self-pay | Admitting: Family Medicine

## 2017-03-03 VITALS — BP 132/80 | HR 68 | Temp 97.9°F | Resp 16 | Ht 66.25 in | Wt 182.0 lb

## 2017-03-03 DIAGNOSIS — E559 Vitamin D deficiency, unspecified: Secondary | ICD-10-CM | POA: Diagnosis not present

## 2017-03-03 DIAGNOSIS — I10 Essential (primary) hypertension: Secondary | ICD-10-CM | POA: Diagnosis not present

## 2017-03-03 DIAGNOSIS — Z125 Encounter for screening for malignant neoplasm of prostate: Secondary | ICD-10-CM

## 2017-03-03 DIAGNOSIS — J301 Allergic rhinitis due to pollen: Secondary | ICD-10-CM

## 2017-03-03 DIAGNOSIS — G894 Chronic pain syndrome: Secondary | ICD-10-CM

## 2017-03-03 DIAGNOSIS — F3341 Major depressive disorder, recurrent, in partial remission: Secondary | ICD-10-CM

## 2017-03-03 DIAGNOSIS — K219 Gastro-esophageal reflux disease without esophagitis: Secondary | ICD-10-CM

## 2017-03-03 DIAGNOSIS — H538 Other visual disturbances: Secondary | ICD-10-CM

## 2017-03-03 DIAGNOSIS — L930 Discoid lupus erythematosus: Secondary | ICD-10-CM

## 2017-03-03 DIAGNOSIS — F5104 Psychophysiologic insomnia: Secondary | ICD-10-CM | POA: Diagnosis not present

## 2017-03-03 DIAGNOSIS — I480 Paroxysmal atrial fibrillation: Secondary | ICD-10-CM

## 2017-03-03 DIAGNOSIS — R7309 Other abnormal glucose: Secondary | ICD-10-CM | POA: Diagnosis not present

## 2017-03-03 DIAGNOSIS — E781 Pure hyperglyceridemia: Secondary | ICD-10-CM

## 2017-03-03 LAB — POCT URINALYSIS DIP (MANUAL ENTRY)
Bilirubin, UA: NEGATIVE
Blood, UA: NEGATIVE
Glucose, UA: NEGATIVE
Ketones, POC UA: NEGATIVE
LEUKOCYTES UA: NEGATIVE
NITRITE UA: NEGATIVE
PH UA: 7 (ref 5.0–8.0)
PROTEIN UA: NEGATIVE
Spec Grav, UA: 1.02 (ref 1.030–1.035)
Urobilinogen, UA: 0.2 (ref ?–2.0)

## 2017-03-03 NOTE — Patient Instructions (Addendum)
   IF you received an x-ray today, you will receive an invoice from [facility]. Please contact [facility] at [phone] with questions or concerns regarding your invoice.   IF you received labwork today, you will receive an invoice from [facility]. Please contact [facility] at [phone] with questions or concerns regarding your invoice.   Our billing staff will not be able to assist you with questions regarding bills from these companies.  You will be contacted with the lab results as soon as they are available. The fastest way to get your results is to activate your My Chart account. Instructions are located on the last page of this paperwork. If you have not heard from us regarding the results in 2 weeks, please contact this office.    Food Choices to Lower Your Triglycerides Triglycerides are a type of fat in your blood. High levels of triglycerides can increase the risk of heart disease and stroke. If your triglyceride levels are high, the foods you eat and your eating habits are very important. Choosing the right foods can help lower your triglycerides. What general guidelines do I need to follow?  Lose weight if you are overweight.  Limit or avoid alcohol.  Fill one half of your plate with vegetables and green salads.  Limit fruit to two servings a day. Choose fruit instead of juice.  Make one fourth of your plate whole grains. Look for the word "whole" as the first word in the ingredient list.  Fill one fourth of your plate with lean protein foods.  Enjoy fatty fish (such as salmon, mackerel, sardines, and tuna) three times a week.  Choose healthy fats.  Limit foods high in starch and sugar.  Eat more home-cooked food and less restaurant, buffet, and fast food.  Limit fried foods.  Cook foods using methods other than frying.  Limit saturated fats.  Check ingredient lists to avoid foods with partially hydrogenated oils (trans fats) in them. What  foods can I eat? Grains Whole grains, such as whole wheat or whole grain breads, crackers, cereals, and pasta. Unsweetened oatmeal, bulgur, barley, quinoa, or brown rice. Corn or whole wheat flour tortillas. Vegetables Fresh or frozen vegetables (raw, steamed, roasted, or grilled). Green salads. Fruits All fresh, canned (in natural juice), or frozen fruits. Meat and Other Protein Products Ground beef (85% or leaner), grass-fed beef, or beef trimmed of fat. Skinless chicken or turkey. Ground chicken or turkey. Pork trimmed of fat. All fish and seafood. Eggs. Dried beans, peas, or lentils. Unsalted nuts or seeds. Unsalted canned or dry beans. Dairy Low-fat dairy products, such as skim or 1% milk, 2% or reduced-fat cheeses, low-fat ricotta or cottage cheese, or plain low-fat yogurt. Fats and Oils Tub margarines without trans fats. Light or reduced-fat mayonnaise and salad dressings. Avocado. Safflower, olive, or canola oils. Natural peanut or almond butter. The items listed above may not be a complete list of recommended foods or beverages. Contact your dietitian for more options. What foods are not recommended? Grains White bread. White pasta. White rice. Cornbread. Bagels, pastries, and croissants. Crackers that contain trans fat. Vegetables White potatoes. Corn. Creamed or fried vegetables. Vegetables in a cheese sauce. Fruits Dried fruits. Canned fruit in light or heavy syrup. Fruit juice. Meat and Other Protein Products Fatty cuts of meat. Ribs, chicken wings, bacon, sausage, bologna, salami, chitterlings, fatback, hot dogs, bratwurst, and packaged luncheon meats. Dairy Whole or 2% milk, cream, half-and-half, and cream cheese. Whole-fat or sweetened yogurt. Full-fat cheeses. Nondairy   creamers and whipped toppings. Processed cheese, cheese spreads, or cheese curds. Sweets and Desserts Corn syrup, sugars, honey, and molasses. Candy. Jam and jelly. Syrup. Sweetened cereals. Cookies, pies,  cakes, donuts, muffins, and ice cream. Fats and Oils Butter, stick margarine, lard, shortening, ghee, or bacon fat. Coconut, palm kernel, or palm oils. Beverages Alcohol. Sweetened drinks (such as sodas, lemonade, and fruit drinks or punches). The items listed above may not be a complete list of foods and beverages to avoid. Contact your dietitian for more information. This information is not intended to replace advice given to you by your health care provider. Make sure you discuss any questions you have with your health care provider. Document Released: 09/04/2004 Document Revised: 04/24/2016 Document Reviewed: 09/21/2013 Elsevier Interactive Patient Education  2017 Elsevier Inc.  

## 2017-03-04 DIAGNOSIS — Z79899 Other long term (current) drug therapy: Secondary | ICD-10-CM | POA: Diagnosis not present

## 2017-03-04 LAB — COMPREHENSIVE METABOLIC PANEL
A/G RATIO: 2.1 (ref 1.2–2.2)
ALT: 28 IU/L (ref 0–44)
AST: 19 IU/L (ref 0–40)
Albumin: 4.4 g/dL (ref 3.6–4.8)
Alkaline Phosphatase: 78 IU/L (ref 39–117)
BILIRUBIN TOTAL: 0.3 mg/dL (ref 0.0–1.2)
BUN/Creatinine Ratio: 14 (ref 10–24)
BUN: 10 mg/dL (ref 8–27)
CHLORIDE: 101 mmol/L (ref 96–106)
CO2: 22 mmol/L (ref 18–29)
Calcium: 9.2 mg/dL (ref 8.6–10.2)
Creatinine, Ser: 0.69 mg/dL — ABNORMAL LOW (ref 0.76–1.27)
GFR calc non Af Amer: 98 mL/min/{1.73_m2} (ref >59)
GFR, EST AFRICAN AMERICAN: 113 mL/min/{1.73_m2} (ref >59)
Globulin, Total: 2.1 g/dL (ref 1.5–4.5)
Glucose: 113 mg/dL — ABNORMAL HIGH (ref 65–99)
POTASSIUM: 4.6 mmol/L (ref 3.5–5.2)
SODIUM: 141 mmol/L (ref 134–144)
TOTAL PROTEIN: 6.5 g/dL (ref 6.0–8.5)

## 2017-03-04 LAB — CBC WITH DIFFERENTIAL/PLATELET
BASOS: 1 %
Basophils Absolute: 0 10*3/uL (ref 0.0–0.2)
EOS (ABSOLUTE): 0.2 10*3/uL (ref 0.0–0.4)
Eos: 4 %
Hematocrit: 46 % (ref 37.5–51.0)
Hemoglobin: 15.5 g/dL (ref 13.0–17.7)
Immature Grans (Abs): 0 10*3/uL (ref 0.0–0.1)
Immature Granulocytes: 0 %
Lymphocytes Absolute: 1.3 10*3/uL (ref 0.7–3.1)
Lymphs: 27 %
MCH: 28.9 pg (ref 26.6–33.0)
MCHC: 33.7 g/dL (ref 31.5–35.7)
MCV: 86 fL (ref 79–97)
MONOS ABS: 0.5 10*3/uL (ref 0.1–0.9)
Monocytes: 11 %
NEUTROS ABS: 2.7 10*3/uL (ref 1.4–7.0)
Neutrophils: 57 %
PLATELETS: 265 10*3/uL (ref 150–379)
RBC: 5.37 x10E6/uL (ref 4.14–5.80)
RDW: 14 % (ref 12.3–15.4)
WBC: 4.7 10*3/uL (ref 3.4–10.8)

## 2017-03-04 LAB — HEMOGLOBIN A1C
Est. average glucose Bld gHb Est-mCnc: 128 mg/dL
Hgb A1c MFr Bld: 6.1 % — ABNORMAL HIGH (ref 4.8–5.6)

## 2017-03-06 ENCOUNTER — Encounter: Payer: Self-pay | Admitting: *Deleted

## 2017-03-06 DIAGNOSIS — Z79899 Other long term (current) drug therapy: Secondary | ICD-10-CM | POA: Insufficient documentation

## 2017-03-30 DIAGNOSIS — M47812 Spondylosis without myelopathy or radiculopathy, cervical region: Secondary | ICD-10-CM | POA: Diagnosis not present

## 2017-03-30 DIAGNOSIS — M150 Primary generalized (osteo)arthritis: Secondary | ICD-10-CM | POA: Diagnosis not present

## 2017-03-30 DIAGNOSIS — M47816 Spondylosis without myelopathy or radiculopathy, lumbar region: Secondary | ICD-10-CM | POA: Diagnosis not present

## 2017-03-30 DIAGNOSIS — Z79891 Long term (current) use of opiate analgesic: Secondary | ICD-10-CM | POA: Diagnosis not present

## 2017-03-31 ENCOUNTER — Ambulatory Visit (INDEPENDENT_AMBULATORY_CARE_PROVIDER_SITE_OTHER): Payer: Medicare Other | Admitting: Rheumatology

## 2017-03-31 ENCOUNTER — Encounter: Payer: Self-pay | Admitting: Rheumatology

## 2017-03-31 VITALS — BP 140/73 | HR 75 | Ht 68.0 in | Wt 183.0 lb

## 2017-03-31 DIAGNOSIS — R5383 Other fatigue: Secondary | ICD-10-CM

## 2017-03-31 DIAGNOSIS — M25532 Pain in left wrist: Secondary | ICD-10-CM

## 2017-03-31 DIAGNOSIS — G4700 Insomnia, unspecified: Secondary | ICD-10-CM

## 2017-03-31 DIAGNOSIS — Z79899 Other long term (current) drug therapy: Secondary | ICD-10-CM

## 2017-03-31 DIAGNOSIS — L930 Discoid lupus erythematosus: Secondary | ICD-10-CM

## 2017-03-31 DIAGNOSIS — M797 Fibromyalgia: Secondary | ICD-10-CM | POA: Diagnosis not present

## 2017-03-31 NOTE — Progress Notes (Signed)
Office Visit Note  Patient: David Weaver             Date of Birth: 1951-06-10           MRN: [id number]             PCP: Reginia Forts, MD Referring: Wardell Honour, MD Visit Date: 03/31/2017 Occupation: '@GUAROCC'$ @    Subjective:  No chief complaint on file.   History of Present Illness: David Weaver is a 66 y.o. male  Last seen in our office on November 2017.  Patient states that his autoimmune diseases doing well. No change in symptoms except for left wrist dorsal pain as described below. Patient is complaining of left wrist pain on the dorsal aspect on the lateral side since the last 1 week. No falls or injuries. No previous history of same.  No oral or nasal ulcers, no new rashes, no additional fatigue,  Morning stiffness lasts for about an hour.  His fibromyalgia described as 5 on a scale of 0-10. He is getting fairly good sleep when he uses trazodone prescribed by his PCP. He uses 50 mg; 2 pills most nights.  He has been taking Plaquenil 200 mg twice a day. His eye exam was done recently and patient states that it was normal. He is also done his lab work early April and he states that it was normal.      Activities of Daily Living:  Patient reports morning stiffness for 60 minutes.   Patient Reports nocturnal pain.  Difficulty dressing/grooming: Reports Difficulty climbing stairs: Reports Difficulty getting out of chair: Reports Difficulty using hands for taps, buttons, cutlery, and/or writing: Reports   Review of Systems  Constitutional: Positive for fatigue.  HENT: Negative for mouth sores and mouth dryness.   Eyes: Negative for dryness.  Respiratory: Negative for shortness of breath.   Gastrointestinal: Negative for constipation and diarrhea.  Musculoskeletal: Positive for myalgias and myalgias.  Skin: Negative for sensitivity to sunlight.  Neurological: Negative for memory loss.  Psychiatric/Behavioral: Positive for sleep disturbance.    PMFS  History:  Patient Active Problem List   Diagnosis Date Noted  . High risk medication use 03/06/2017  . Paroxysmal atrial fibrillation ([place]) 02/19/2016  . Chronic pain syndrome 02/12/2015  . Left knee DJD   . Allergic rhinitis   . Osteoarthrosis, unspecified whether generalized or localized, lower leg   . Esophageal reflux   . Lupus erythematosus   . Hearing loss   . Right knee DJD   . Essential hypertension, benign 11/01/2012  . Other abnormal glucose 11/01/2012  . Depression 11/01/2012  . Insomnia 11/01/2012    Past Medical History:  Diagnosis Date  . Alcohol abuse, daily use   . Allergic rhinitis, cause unspecified   . Dysthymic disorder   . Esophageal reflux   . Essential hypertension, benign   . Fibromyalgia    followed by [name] every six months.  . Impotence of organic origin   . Insomnia, unspecified   . Left knee DJD   . Lupus erythematosus   . Osteoarthrosis, unspecified whether generalized or localized, lower leg   . Other abnormal glucose   . Other specified disorder of male genital organs(608.89)   . Unspecified hearing loss   . Unspecified vitamin D deficiency     Family History  Problem Relation Age of Onset  . Alzheimer's disease Mother   . Osteoporosis Mother   . Heart disease Father 65    AMI  . Alcohol  abuse Father   . Cancer Brother     colon cancer  . Diabetes Brother   . Kidney failure Brother    Past Surgical History:  Procedure Laterality Date  . CARPAL TUNNEL RELEASE     bilateral  . CHOLECYSTECTOMY    . COLONOSCOPY  03/01/2014   two polyps; Dr. Benson Norway.  Repeat 5 years.  [name] GANGLION CYST EXCISION  1980   Left wrist  . JOINT REPLACEMENT  01/2013   right knee, left knee. [facility].  [name] KNEE ARTHROSCOPY  2008   Right  . lumbar spine epidural  2009   [facility]  . ROTATOR CUFF REPAIR  10/2008   right  . TOTAL KNEE ARTHROPLASTY  12/27/2012   Procedure: TOTAL KNEE ARTHROPLASTY;  Surgeon: [name], MD;  Location: [facility];   Service: Orthopedics;  Laterality: Right;  RIGHT ARTHROPLASTY KNEE MEDIAL AND LATERAL COMPARTMENTS WITH PATELLA RESURFACING, RIGHT TOTAL KNEE REPLACEMENT  . TOTAL KNEE ARTHROPLASTY Left 01/31/2013   Procedure: TOTAL KNEE ARTHROPLASTY;  Surgeon: [name], MD;  Location: [place] [facility];  Service: Orthopedics;  Laterality: Left;   Social History   Social History Narrative   Guns in the home stored in locked cabinet.       Caffeine use: 1 serving/ day.     Marital status:  Married x 25 years, third marriage, happily married.      Children: one son (37); one step-daughter; two grandsons      Lives: with wife. Mother-in-law going to move in in 2017.      Employment:  Works at [employer] on [employer]; [employer] work; happy; 25 hours per week.      Tobacco: none currently; quit 2013.  Smoked x 40 years.      Alcohol:  2 beers per night in 2017; much heavier use in fifties.      Drugs: none      Exercise:  None in 2017.      Seatbelt: 100%      ADLs: independent with ADLs in 2017; no assistant devices      Advanced Directives: none in 2017; FULL CODE in 2017; no prolonged measures     Objective: Vital Signs: BP 140/73   Pulse 75   Ht '5\' 8"'$  (1.727 m)   Wt 183 lb (83 kg)   BMI 27.83 kg/m    Physical Exam  Constitutional: He is oriented to person, place, and time. He appears well-developed and well-nourished.  HENT:  Head: Normocephalic and atraumatic.  Eyes: Conjunctivae and EOM are normal. Pupils are equal, round, and reactive to light.  Neck: Normal range of motion. Neck supple.  Cardiovascular: Normal rate, regular rhythm and normal heart sounds.  Exam reveals no gallop and no friction rub.   No murmur heard. Pulmonary/Chest: Effort normal and breath sounds normal. No respiratory distress. He has no wheezes. He has no rales. He exhibits no tenderness.  Abdominal: Soft. He exhibits no distension and no mass. There is no tenderness. There is no guarding.  Musculoskeletal: Normal range of  motion.  Lymphadenopathy:    He has no cervical adenopathy.  Neurological: He is alert and oriented to person, place, and time. He exhibits normal muscle tone. Coordination normal.  Skin: Skin is warm and dry. Capillary refill takes less than 2 seconds. No rash noted.  Psychiatric: He has a normal mood and affect. His behavior is normal. Judgment and thought content normal.  Vitals reviewed.    Musculoskeletal Exam:  Full  range of motion of all joints Grip strength is equal and strong bilaterally Fibromyalgia tender points are all absent  CDAI Exam: CDAI Homunculus Exam:   Tenderness:  LUE: wrist Right hand: 2nd MCP Left hand: 2nd MCP  Swelling:  LUE: carpometacarpal Right hand: 2nd MCP Left hand: 2nd MCP  Joint Counts:  CDAI Tender Joint count: 3 CDAI Swollen Joint count: 2  Global Assessments:  Patient Global Assessment: 5 Provider Global Assessment: 5  CDAI Calculated Score: 15    Investigation: No additional findings.  Office Visit on 03/03/2017  Component Date Value Ref Range Status  . WBC 03/03/2017 4.7  3.4 - 10.8 x10E3/uL Final  . RBC 03/03/2017 5.37  4.14 - 5.80 x10E6/uL Final  . Hemoglobin 03/03/2017 15.5  13.0 - 17.7 g/dL Final  . Hematocrit 03/03/2017 46.0  37.5 - 51.0 % Final  . MCV 03/03/2017 86  79 - 97 fL Final  . MCH 03/03/2017 28.9  26.6 - 33.0 pg Final  . MCHC 03/03/2017 33.7  31.5 - 35.7 g/dL Final  . RDW 03/03/2017 14.0  12.3 - 15.4 % Final  . Platelets 03/03/2017 265  150 - 379 x10E3/uL Final  . Neutrophils 03/03/2017 57  Not Estab. % Final  . Lymphs 03/03/2017 27  Not Estab. % Final  . Monocytes 03/03/2017 11  Not Estab. % Final  . Eos 03/03/2017 4  Not Estab. % Final  . Basos 03/03/2017 1  Not Estab. % Final  . Neutrophils Absolute 03/03/2017 2.7  1.4 - 7.0 x10E3/uL Final  . Lymphocytes Absolute 03/03/2017 1.3  0.7 - 3.1 x10E3/uL Final  . Monocytes Absolute 03/03/2017 0.5  0.1 - 0.9 x10E3/uL Final  . EOS (ABSOLUTE) 03/03/2017 0.2   0.0 - 0.4 x10E3/uL Final  . Basophils Absolute 03/03/2017 0.0  0.0 - 0.2 x10E3/uL Final  . Immature Granulocytes 03/03/2017 0  Not Estab. % Final  . Immature Grans (Abs) 03/03/2017 0.0  0.0 - 0.1 x10E3/uL Final  . Glucose 03/03/2017 113* 65 - 99 mg/dL Final  . BUN 03/03/2017 10  8 - 27 mg/dL Final  . Creatinine, Ser 03/03/2017 0.69* 0.76 - 1.27 mg/dL Final  . GFR calc non Af Amer 03/03/2017 98  >59 mL/min/1.73 Final  . GFR calc Af Amer 03/03/2017 113  >59 mL/min/1.73 Final  . BUN/Creatinine Ratio 03/03/2017 14  10 - 24 Final  . Sodium 03/03/2017 141  134 - 144 mmol/L Final  . Potassium 03/03/2017 4.6  3.5 - 5.2 mmol/L Final  . Chloride 03/03/2017 101  96 - 106 mmol/L Final  . CO2 03/03/2017 22  18 - 29 mmol/L Final  . Calcium 03/03/2017 9.2  8.6 - 10.2 mg/dL Final  . Total Protein 03/03/2017 6.5  6.0 - 8.5 g/dL Final  . Albumin 03/03/2017 4.4  3.6 - 4.8 g/dL Final  . Globulin, Total 03/03/2017 2.1  1.5 - 4.5 g/dL Final  . Albumin/Globulin Ratio 03/03/2017 2.1  1.2 - 2.2 Final  . Bilirubin Total 03/03/2017 0.3  0.0 - 1.2 mg/dL Final  . Alkaline Phosphatase 03/03/2017 78  39 - 117 IU/L Final  . AST 03/03/2017 19  0 - 40 IU/L Final  . ALT 03/03/2017 28  0 - 44 IU/L Final  . Hgb A1c MFr Bld 03/03/2017 6.1* 4.8 - 5.6 % Final   Comment:          Pre-diabetes: 5.7 - 6.4          Diabetes: >6.4  Glycemic control for adults with diabetes: <7.0   . Est. average glucose Bld gHb Est-m* 03/03/2017 128  mg/dL Final  . Color, UA 03/03/2017 yellow  yellow Final  . Clarity, UA 03/03/2017 clear  clear Final  . Glucose, UA 03/03/2017 negative  negative Final  . Bilirubin, UA 03/03/2017 negative  negative Final  . Ketones, POC UA 03/03/2017 negative  negative Final  . Spec Grav, UA 03/03/2017 1.020  1.030 - 1.035 Final  . Blood, UA 03/03/2017 negative  negative Final  . pH, UA 03/03/2017 7.0  5.0 - 8.0 Final  . Protein Ur, POC 03/03/2017 negative  negative Final  . Urobilinogen, UA  03/03/2017 0.2  Negative - 2.0 Final  . Nitrite, UA 03/03/2017 Negative  Negative Final  . Leukocytes, UA 03/03/2017 Negative  Negative Final  Office Visit on 10/30/2016  Component Date Value Ref Range Status  . Color, UA 10/30/2016 yellow  yellow Final  . Clarity, UA 10/30/2016 clear  clear Final  . Glucose, UA 10/30/2016 negative  negative Final  . Bilirubin, UA 10/30/2016 negative  negative Final  . Ketones, POC UA 10/30/2016 negative  negative Final  . Spec Grav, UA 10/30/2016 1.020   Final  . Blood, UA 10/30/2016 negative  negative Final  . pH, UA 10/30/2016 7.0   Final  . Protein Ur, POC 10/30/2016 negative  negative Final  . Urobilinogen, UA 10/30/2016 0.2   Final  . Nitrite, UA 10/30/2016 Negative  Negative Final  . Leukocytes, UA 10/30/2016 Negative  Negative Final  . WBC 10/30/2016 5.2  3.8 - 10.8 K/uL Final  . RBC 10/30/2016 5.22  4.20 - 5.80 MIL/uL Final  . Hemoglobin 10/30/2016 15.5  13.2 - 17.1 g/dL Final  . HCT 10/30/2016 46.1  38.5 - 50.0 % Final  . MCV 10/30/2016 88.3  80.0 - 100.0 fL Final  . MCH 10/30/2016 29.7  27.0 - 33.0 pg Final  . MCHC 10/30/2016 33.6  32.0 - 36.0 g/dL Final  . RDW 10/30/2016 13.8  11.0 - 15.0 % Final  . Platelets 10/30/2016 259  140 - 400 K/uL Final  . MPV 10/30/2016 9.7  7.5 - 12.5 fL Final  . Neutro Abs 10/30/2016 3276  1,500 - 7,800 cells/uL Final  . Lymphs Abs 10/30/2016 1144  850 - 3,900 cells/uL Final  . Monocytes Absolute 10/30/2016 520  200 - 950 cells/uL Final  . Eosinophils Absolute 10/30/2016 208  15 - 500 cells/uL Final  . Basophils Absolute 10/30/2016 52  0 - 200 cells/uL Final  . Neutrophils Relative % 10/30/2016 63  % Final  . Lymphocytes Relative 10/30/2016 22  % Final  . Monocytes Relative 10/30/2016 10  % Final  . Eosinophils Relative 10/30/2016 4  % Final  . Basophils Relative 10/30/2016 1  % Final  . Smear Review 10/30/2016 Criteria for review not met   Final  . Sodium 10/30/2016 140  135 - 146 mmol/L Final  .  Potassium 10/30/2016 4.1  3.5 - 5.3 mmol/L Final  . Chloride 10/30/2016 106  98 - 110 mmol/L Final  . CO2 10/30/2016 23  20 - 31 mmol/L Final  . Glucose, Bld 10/30/2016 113* 65 - 99 mg/dL Final  . BUN 10/30/2016 13  7 - 25 mg/dL Final  . Creat 10/30/2016 0.60* 0.70 - 1.25 mg/dL Final   Comment:   For patients > or = 68 years of age: The upper reference limit for Creatinine is approximately 13% higher for people identified as African-American.     [name]  Total Bilirubin 10/30/2016 0.7  0.2 - 1.2 mg/dL Final  . Alkaline Phosphatase 10/30/2016 95  40 - 115 U/L Final  . AST 10/30/2016 29  10 - 35 U/L Final  . ALT 10/30/2016 50* 9 - 46 U/L Final  . Total Protein 10/30/2016 6.7  6.1 - 8.1 g/dL Final  . Albumin 10/30/2016 4.5  3.6 - 5.1 g/dL Final  . Calcium 10/30/2016 9.3  8.6 - 10.3 mg/dL Final  Office Visit on 10/14/2016  Component Date Value Ref Range Status  . WBC 10/14/2016 5.4  3.8 - 10.8 K/uL Final  . RBC 10/14/2016 4.80  4.20 - 5.80 MIL/uL Final  . Hemoglobin 10/14/2016 14.1  13.2 - 17.1 g/dL Final  . HCT 10/14/2016 41.3  38.5 - 50.0 % Final  . MCV 10/14/2016 86.0  80.0 - 100.0 fL Final  . MCH 10/14/2016 29.4  27.0 - 33.0 pg Final  . MCHC 10/14/2016 34.1  32.0 - 36.0 g/dL Final  . RDW 10/14/2016 13.8  11.0 - 15.0 % Final  . Platelets 10/14/2016 284  140 - 400 K/uL Final  . MPV 10/14/2016 9.6  7.5 - 12.5 fL Final  . Neutro Abs 10/14/2016 3132  1,500 - 7,800 cells/uL Final  . Lymphs Abs 10/14/2016 1458  850 - 3,900 cells/uL Final  . Monocytes Absolute 10/14/2016 540  200 - 950 cells/uL Final  . Eosinophils Absolute 10/14/2016 216  15 - 500 cells/uL Final  . Basophils Absolute 10/14/2016 54  0 - 200 cells/uL Final  . Neutrophils Relative % 10/14/2016 58  % Final  . Lymphocytes Relative 10/14/2016 27  % Final  . Monocytes Relative 10/14/2016 10  % Final  . Eosinophils Relative 10/14/2016 4  % Final  . Basophils Relative 10/14/2016 1  % Final  . Smear Review 10/14/2016 Criteria for  review not met   Final  . Sodium 10/14/2016 138  135 - 146 mmol/L Final  . Potassium 10/14/2016 3.6  3.5 - 5.3 mmol/L Final  . Chloride 10/14/2016 104  98 - 110 mmol/L Final  . CO2 10/14/2016 25  20 - 31 mmol/L Final  . Glucose, Bld 10/14/2016 82  65 - 99 mg/dL Final  . BUN 10/14/2016 16  7 - 25 mg/dL Final  . Creat 10/14/2016 0.75  0.70 - 1.25 mg/dL Final   Comment:   For patients > or = 68 years of age: The upper reference limit for Creatinine is approximately 13% higher for people identified as African-American.     . Total Bilirubin 10/14/2016 0.4  0.2 - 1.2 mg/dL Final  . Alkaline Phosphatase 10/14/2016 75  40 - 115 U/L Final  . AST 10/14/2016 21  10 - 35 U/L Final  . ALT 10/14/2016 26  9 - 46 U/L Final  . Total Protein 10/14/2016 6.1  6.1 - 8.1 g/dL Final  . Albumin 10/14/2016 4.1  3.6 - 5.1 g/dL Final  . Calcium 10/14/2016 8.7  8.6 - 10.3 mg/dL Final  . GFR, Est African American 10/14/2016 >89  >=60 mL/min Final  . GFR, Est Non African American 10/14/2016 >89  >=60 mL/min Final  . Color, Urine 10/14/2016 YELLOW  YELLOW Final  . APPearance 10/14/2016 CLEAR  CLEAR Final  . Specific Gravity, Urine 10/14/2016 1.020  1.001 - 1.035 Final  . pH 10/14/2016 5.5  5.0 - 8.0 Final  . Glucose, UA 10/14/2016 NEGATIVE  NEGATIVE Final  . Bilirubin Urine 10/14/2016 NEGATIVE  NEGATIVE Final  . Ketones, ur 10/14/2016 NEGATIVE  NEGATIVE Final  .  Hgb urine dipstick 10/14/2016 NEGATIVE  NEGATIVE Final  . Protein, ur 10/14/2016 NEGATIVE  NEGATIVE Final  . Nitrite 10/14/2016 NEGATIVE  NEGATIVE Final  . Leukocytes, UA 10/14/2016 NEGATIVE  NEGATIVE Final     Imaging: No results found.  Speciality Comments: No specialty comments available.    Procedures:  No procedures performed Allergies: Patient has no known allergies.   Assessment / Plan:     Visit Diagnoses: Lupus erythematosus, unspecified form  High risk medication use   Plan: #1: Autoimmune disease. Morning stiffness for  about one hour. (This is a new complaint. Patient usually does well) Left wrist, dorsal aspect, lateral side with swelling for the last 1 week. No history of injury. Consistent with synovitis/tenosynovitis).  Several MCPs on the left and right hand with synovial thickening and tenderness to palpation  Patient has been compliant with his Plaquenil.  #2: High risk prescription CBC with differential CMP with GFR normal and up-to-date as of April 2018 Plaquenil 200 mg twice a day; 7 days a week Plaquenil eye exam at home and care is normal as of 03/04/2017.  #3: Left wrist pain  #4: Fiber myalgia syndrome. Rates his discomfort as 5 on a scale of 0-10. Consistent with past exam. 18 out of 18 tender points  #5: History of insomnia but well addressed with taking trazodone 50 mg; 2 pills on most nights and gets very good relief Prescribed by PCP  #6: Patient is not really exercising. He takes his dog for a walk couple times a day. We've advised him to do 50 minutes 3 times a week. Patient is agreeable. 7  #7: Return to clinic for ultrasound of bilateral hands and wrists. Dr. Estanislado Pandy examine his wrists and MCP joints and is agreeable with the assessment and plan  #8: Return to clinic in 4 months for regular follow-up  #8: Patient has enough Plaquenil at this time. We'll give him 30 day supply when appropriate in case we need to change him to a different medication, I do not want him to have too much Plaquenil double goal on use.   Orders: No orders of the defined types were placed in this encounter.  No orders of the defined types were placed in this encounter.   Face-to-face time spent with patient was 30 minutes. 50% of time was spent in counseling and coordination of care.  Follow-Up Instructions: Return in about 4 months (around 08/01/2017) for A.D.//PLQ 200 BID // Left WJ pain // FMS // Fatigue//Insomnia.   Eliezer Lofts, PA-C  Patient had mild synovitis on examination  today his been taking his medications on a regular basis. We will observe how he does over the next few months and otherwise he may need combination therapy. I examined and evaluated the patient with Eliezer Lofts PA. The plan of care was discussed as noted above.  Bo Merino, MD Note - This record has been created using Editor, commissioning.  Chart creation errors have been sought, but may not always  have been located. Such creation errors do not reflect on  the standard of medical care.

## 2017-04-13 ENCOUNTER — Ambulatory Visit: Payer: Medicare Other | Admitting: Rheumatology

## 2017-05-01 NOTE — Progress Notes (Signed)
Plan: #1: Autoimmune disease. Morning stiffness for about one hour. (This is a new complaint. Patient usually does well) Left wrist, dorsal aspect, lateral side with swelling for the last 1 week. No history of injury. Consistent with synovitis/tenosynovitis).  Several MCPs on the left and right hand with synovial thickening and tenderness to palpation  Patient has been compliant with his Plaquenil.  #2: High risk prescription CBC with differential CMP with GFR normal and up-to-date as of April 2018 Plaquenil 200 mg twice a day; 7 days a week Plaquenil eye exam at home and care is normal as of 03/04/2017.  #3: Left wrist pain  #4: Fiber myalgia syndrome. Rates his discomfort as 5 on a scale of 0-10. Consistent with past exam. 18 out of 18 tender points  #5: History of insomnia but well addressed with taking trazodone 50 mg; 2 pills on most nights and gets very good relief Prescribed by PCP  #6: Patient is not really exercising. He takes his dog for a walk couple times a day. We've advised him to do 50 minutes 3 times a week. Patient is agreeable. 7  #7: Return to clinic for ultrasound of bilateral hands and wrists. Dr. Estanislado Pandy examine his wrists and MCP joints and is agreeable with the assessment and plan  #8: Return to clinic in 4 months for regular follow-up  #8: Patient has enough Plaquenil at this time. We'll give him 30 day supply when appropriate in case we need to change him to a different medication, I do not want him to have too much Plaquenil double goal on use.   Orders: No orders of the defined types were placed in this encounter.  No orders of the defined types were placed in this encounter.   Face-to-face time spent with patient was 30 minutes. 50% of time was spent in counseling and coordination of care.  Follow-Up Instructions: Return in about 4 months (around 08/01/2017) for A.D.//PLQ 200 BID // Left WJ pain // FMS //  Fatigue//Insomnia.   Eliezer Lofts, PA-C  Patient had mild synovitis on examination today his been taking his medications on a regular basis. We will observe how he does over the next few months and otherwise he may need combination therapy.

## 2017-05-06 ENCOUNTER — Ambulatory Visit (INDEPENDENT_AMBULATORY_CARE_PROVIDER_SITE_OTHER): Payer: Medicare Other | Admitting: Rheumatology

## 2017-05-06 ENCOUNTER — Inpatient Hospital Stay (INDEPENDENT_AMBULATORY_CARE_PROVIDER_SITE_OTHER): Payer: Self-pay

## 2017-05-06 DIAGNOSIS — M79641 Pain in right hand: Secondary | ICD-10-CM | POA: Diagnosis not present

## 2017-05-06 DIAGNOSIS — M79642 Pain in left hand: Secondary | ICD-10-CM | POA: Diagnosis not present

## 2017-05-15 ENCOUNTER — Ambulatory Visit (INDEPENDENT_AMBULATORY_CARE_PROVIDER_SITE_OTHER): Payer: Medicare Other | Admitting: Physician Assistant

## 2017-05-15 ENCOUNTER — Encounter: Payer: Self-pay | Admitting: Physician Assistant

## 2017-05-15 VITALS — BP 100/62 | HR 74 | Temp 97.5°F | Resp 16 | Ht 68.0 in | Wt 181.2 lb

## 2017-05-15 DIAGNOSIS — J029 Acute pharyngitis, unspecified: Secondary | ICD-10-CM

## 2017-05-15 LAB — POCT RAPID STREP A (OFFICE): Rapid Strep A Screen: NEGATIVE

## 2017-05-15 MED ORDER — LIDOCAINE VISCOUS 2 % MT SOLN: 5.0000 mL | OROMUCOSAL | 0 refills | Status: DC | PRN

## 2017-05-15 MED ORDER — AZITHROMYCIN 250 MG PO TABS: ORAL_TABLET | ORAL | 0 refills | Status: DC

## 2017-05-15 NOTE — Patient Instructions (Signed)
     IF you received an x-ray today, you will receive an invoice from [facility]. Please contact [facility] at [phone] with questions or concerns regarding your invoice.   IF you received labwork today, you will receive an invoice from [facility]. Please contact [facility] at [phone] with questions or concerns regarding your invoice.   Our billing staff will not be able to assist you with questions regarding bills from these companies.  You will be contacted with the lab results as soon as they are available. The fastest way to get your results is to activate your My Chart account. Instructions are located on the last page of this paperwork. If you have not heard from us regarding the results in 2 weeks, please contact this office.     

## 2017-05-15 NOTE — Progress Notes (Signed)
05/15/2017 12:24 PM   DOB: 1951/02/06 / MRN: [id number]  SUBJECTIVE:  David Weaver is a 66 y.o. male presenting for sore throat that started about 1 week ago.  Tells me his wife had the same thing and she is improved now.  Associates nasal congestion.  Denies fever and abnormal cough. Denies exterior throat pain or swelling. No immunocompromising factors or medications.  No GERD.    He has No Known Allergies.   He  has a past medical history of Alcohol abuse, daily use; Allergic rhinitis, cause unspecified; Dysthymic disorder; Esophageal reflux; Essential hypertension, benign; Fibromyalgia; Impotence of organic origin; Insomnia, unspecified; Left knee DJD; Lupus erythematosus; Osteoarthrosis, unspecified whether generalized or localized, lower leg; Other abnormal glucose; Other specified disorder of male genital organs(608.89); Unspecified hearing loss; and Unspecified vitamin D deficiency.    He  reports that he quit smoking about 8 years ago. His smoking use included Cigarettes. He has a 60.00 pack-year smoking history. He has never used smokeless tobacco. He reports that he drinks about 6.0 oz of alcohol per week . He reports that he does not use drugs. He  reports that he currently engages in sexual activity. He reports using the following method of birth control/protection: Post-menopausal. The patient  has a past surgical history that includes Cholecystectomy; Carpal tunnel release; Ganglion cyst excision (1980); Knee arthroscopy (2008); lumbar spine epidural (2009); Rotator cuff repair (10/2008); Total knee arthroplasty (12/27/2012); Total knee arthroplasty (Left, 01/31/2013); Joint replacement (01/2013); and Colonoscopy (03/01/2014).  His family history includes Alcohol abuse in his father; Alzheimer's disease in his mother; Cancer in his brother; Diabetes in his brother; Heart disease (age of onset: 37) in his father; Kidney failure in his brother; Osteoporosis in his mother.  Review of Systems    Constitutional: Negative for chills, diaphoresis and fever.  HENT: Positive for congestion and sore throat. Negative for ear pain.   Respiratory: Negative for cough, hemoptysis, sputum production, shortness of breath and wheezing.   Cardiovascular: Negative for chest pain, orthopnea and leg swelling.  Gastrointestinal: Negative for nausea.  Skin: Negative for rash.  Neurological: Negative for dizziness.    The problem list and medications were reviewed and updated by myself where necessary and exist elsewhere in the encounter.   OBJECTIVE:  BP 100/62 (BP Location: Right Arm, Patient Position: Sitting, Cuff Size: Normal)   Pulse 74   Temp 97.5 F (36.4 C) (Oral)   Resp 16   Ht 5\' 8"  (1.727 m)   Wt 181 lb 3.2 oz (82.2 kg)   SpO2 97%   BMI 27.55 kg/m   Physical Exam  Constitutional: He appears well-developed. He is active and cooperative.  Non-toxic appearance.  HENT:  Right Ear: Hearing, tympanic membrane, external ear and ear canal normal.  Left Ear: Hearing, tympanic membrane, external ear and ear canal normal.  Nose: Nose normal. Right sinus exhibits no maxillary sinus tenderness and no frontal sinus tenderness. Left sinus exhibits no maxillary sinus tenderness and no frontal sinus tenderness.  Mouth/Throat: Uvula is midline, oropharynx is clear and moist and mucous membranes are normal. No oropharyngeal exudate, posterior oropharyngeal edema or tonsillar abscesses.  Eyes: Conjunctivae are normal. Pupils are equal, round, and reactive to light.  Cardiovascular: Normal rate, regular rhythm, S1 normal, S2 normal, normal heart sounds, intact distal pulses and normal pulses.  Exam reveals no gallop and no friction rub.   No murmur heard. Pulmonary/Chest: Effort normal. No stridor. No tachypnea. No respiratory distress. He has no wheezes.  He has no rales.  Abdominal: He exhibits no distension.  Musculoskeletal: He exhibits no edema.  Lymphadenopathy:       Head (right side): No  submandibular and no tonsillar adenopathy present.       Head (left side): No submandibular and no tonsillar adenopathy present.    He has no cervical adenopathy.  Neurological: He is alert.  Skin: Skin is warm and dry. He is not diaphoretic. No pallor.  Vitals reviewed.   Results for orders placed or performed in visit on 05/15/17 (from the past 72 hour(s))  Rapid Strep A     Status: None   Collection Time: 05/15/17 11:59 AM  Result Value Ref Range   Rapid Strep A Screen Negative Negative    No results found.  ASSESSMENT AND PLAN:  Brysun was seen today for sore throat.  Diagnoses and all orders for this visit:  Sore throat: He throat appears normal to me.  Will start him on viscous lido and will call if culture is positive       -     Culture, Group A Strep -     Rapid Strep A -     azithromycin (ZITHROMAX) 250 MG tablet; Do not fill unless fever, chills, temp greater than 100, or positive strep. -     lidocaine (XYLOCAINE) 2 % solution; Use as directed 5-10 mLs in the mouth or throat every 3 (three) hours as needed for mouth pain.    The patient is advised to call or return to clinic if he does not see an improvement in symptoms, or to seek the care of the closest emergency department if he worsens with the above plan.   Philis Fendt, MHS, PA-C [facility] 05/15/2017 12:24 PM

## 2017-05-17 LAB — CULTURE, GROUP A STREP: STREP A CULTURE: NEGATIVE

## 2017-05-18 ENCOUNTER — Telehealth: Payer: Self-pay

## 2017-05-18 NOTE — Telephone Encounter (Signed)
Call placed to patient per provider request. Patient made aware of lab results and advised to d/c antibiotic, verbalized understanding./ [facility]

## 2017-05-18 NOTE — Telephone Encounter (Signed)
-----   Message from Tereasa Coop, PA-C sent at 05/18/2017 10:44 AM EDT ----- No strep.  Advise that he avoid abx. Philis Fendt, MS, PA-C 10:44 AM, 05/18/2017

## 2017-05-23 ENCOUNTER — Other Ambulatory Visit: Payer: Self-pay | Admitting: Physician Assistant

## 2017-05-25 DIAGNOSIS — Z79891 Long term (current) use of opiate analgesic: Secondary | ICD-10-CM | POA: Diagnosis not present

## 2017-05-25 DIAGNOSIS — M47812 Spondylosis without myelopathy or radiculopathy, cervical region: Secondary | ICD-10-CM | POA: Diagnosis not present

## 2017-05-25 DIAGNOSIS — M150 Primary generalized (osteo)arthritis: Secondary | ICD-10-CM | POA: Diagnosis not present

## 2017-05-25 DIAGNOSIS — M47816 Spondylosis without myelopathy or radiculopathy, lumbar region: Secondary | ICD-10-CM | POA: Diagnosis not present

## 2017-06-10 ENCOUNTER — Other Ambulatory Visit: Payer: Self-pay | Admitting: Anesthesiology

## 2017-06-10 ENCOUNTER — Ambulatory Visit
Admission: RE | Admit: 2017-06-10 | Discharge: 2017-06-10 | Disposition: A | Discharge status: Home / Self Care | Payer: Medicare Other | Source: Ambulatory Visit | Attending: Anesthesiology | Admitting: Anesthesiology

## 2017-06-10 DIAGNOSIS — M5432 Sciatica, left side: Secondary | ICD-10-CM | POA: Diagnosis not present

## 2017-06-10 DIAGNOSIS — R52 Pain, unspecified: Secondary | ICD-10-CM

## 2017-06-10 DIAGNOSIS — M47814 Spondylosis without myelopathy or radiculopathy, thoracic region: Secondary | ICD-10-CM | POA: Diagnosis not present

## 2017-06-10 DIAGNOSIS — M47816 Spondylosis without myelopathy or radiculopathy, lumbar region: Secondary | ICD-10-CM | POA: Diagnosis not present

## 2017-06-10 DIAGNOSIS — M150 Primary generalized (osteo)arthritis: Secondary | ICD-10-CM | POA: Diagnosis not present

## 2017-06-10 DIAGNOSIS — M47812 Spondylosis without myelopathy or radiculopathy, cervical region: Secondary | ICD-10-CM | POA: Diagnosis not present

## 2017-06-22 DIAGNOSIS — M5432 Sciatica, left side: Secondary | ICD-10-CM | POA: Diagnosis not present

## 2017-06-22 DIAGNOSIS — M47816 Spondylosis without myelopathy or radiculopathy, lumbar region: Secondary | ICD-10-CM | POA: Diagnosis not present

## 2017-06-22 DIAGNOSIS — M47812 Spondylosis without myelopathy or radiculopathy, cervical region: Secondary | ICD-10-CM | POA: Diagnosis not present

## 2017-06-22 DIAGNOSIS — M150 Primary generalized (osteo)arthritis: Secondary | ICD-10-CM | POA: Diagnosis not present

## 2017-06-29 ENCOUNTER — Other Ambulatory Visit: Payer: Self-pay | Admitting: Neurosurgery

## 2017-06-29 DIAGNOSIS — M5432 Sciatica, left side: Secondary | ICD-10-CM

## 2017-07-01 ENCOUNTER — Other Ambulatory Visit: Payer: Self-pay | Admitting: Physical Medicine and Rehabilitation

## 2017-07-01 DIAGNOSIS — M5432 Sciatica, left side: Secondary | ICD-10-CM

## 2017-07-10 ENCOUNTER — Ambulatory Visit
Admission: RE | Admit: 2017-07-10 | Discharge: 2017-07-10 | Disposition: A | Discharge status: Home / Self Care | Payer: Medicare Other | Source: Ambulatory Visit | Attending: Neurosurgery | Admitting: Neurosurgery

## 2017-07-10 DIAGNOSIS — M5432 Sciatica, left side: Secondary | ICD-10-CM

## 2017-07-10 DIAGNOSIS — M5126 Other intervertebral disc displacement, lumbar region: Secondary | ICD-10-CM | POA: Diagnosis not present

## 2017-07-20 DIAGNOSIS — M5432 Sciatica, left side: Secondary | ICD-10-CM | POA: Diagnosis not present

## 2017-07-20 DIAGNOSIS — M47812 Spondylosis without myelopathy or radiculopathy, cervical region: Secondary | ICD-10-CM | POA: Diagnosis not present

## 2017-07-20 DIAGNOSIS — M150 Primary generalized (osteo)arthritis: Secondary | ICD-10-CM | POA: Diagnosis not present

## 2017-07-20 DIAGNOSIS — M47816 Spondylosis without myelopathy or radiculopathy, lumbar region: Secondary | ICD-10-CM | POA: Diagnosis not present

## 2017-07-24 ENCOUNTER — Encounter: Payer: Self-pay | Admitting: Rheumatology

## 2017-07-24 ENCOUNTER — Ambulatory Visit (INDEPENDENT_AMBULATORY_CARE_PROVIDER_SITE_OTHER): Payer: Medicare Other | Admitting: Rheumatology

## 2017-07-24 VITALS — BP 120/68 | HR 72 | Resp 15 | Ht 68.5 in | Wt 190.0 lb

## 2017-07-24 DIAGNOSIS — G4700 Insomnia, unspecified: Secondary | ICD-10-CM

## 2017-07-24 DIAGNOSIS — Z79899 Other long term (current) drug therapy: Secondary | ICD-10-CM | POA: Diagnosis not present

## 2017-07-24 DIAGNOSIS — L930 Discoid lupus erythematosus: Secondary | ICD-10-CM

## 2017-07-24 DIAGNOSIS — R5383 Other fatigue: Secondary | ICD-10-CM | POA: Diagnosis not present

## 2017-07-24 DIAGNOSIS — M797 Fibromyalgia: Secondary | ICD-10-CM

## 2017-07-24 DIAGNOSIS — Z5181 Encounter for therapeutic drug level monitoring: Secondary | ICD-10-CM

## 2017-07-24 LAB — CBC WITH DIFFERENTIAL/PLATELET
BASOS ABS: 49 {cells}/uL (ref 0–200)
Basophils Relative: 1 %
EOS ABS: 294 {cells}/uL (ref 15–500)
Eosinophils Relative: 6 %
HCT: 45.3 % (ref 38.5–50.0)
HEMOGLOBIN: 15.4 g/dL (ref 13.2–17.1)
Lymphocytes Relative: 25 %
Lymphs Abs: 1225 cells/uL (ref 850–3900)
MCH: 29.7 pg (ref 27.0–33.0)
MCHC: 34 g/dL (ref 32.0–36.0)
MCV: 87.3 fL (ref 80.0–100.0)
MONOS PCT: 12 %
MPV: 9.7 fL (ref 7.5–12.5)
Monocytes Absolute: 588 cells/uL (ref 200–950)
NEUTROS ABS: 2744 {cells}/uL (ref 1500–7800)
NEUTROS PCT: 56 %
PLATELETS: 236 10*3/uL (ref 140–400)
RBC: 5.19 MIL/uL (ref 4.20–5.80)
RDW: 14 % (ref 11.0–15.0)
WBC: 4.9 10*3/uL (ref 3.8–10.8)

## 2017-07-24 MED ORDER — METHOTREXATE 2.5 MG PO TABS: 15.0000 mg | ORAL_TABLET | ORAL | 2 refills | Status: DC

## 2017-07-24 MED ORDER — FOLIC ACID 1 MG PO TABS: 2.0000 mg | ORAL_TABLET | Freq: Every day | ORAL | 5 refills | Status: DC

## 2017-07-24 NOTE — Patient Instructions (Signed)
Standing Labs We placed an order today for your standing lab work.    Please come back and get your standing labs in 2 weeks after starting methotrexate, then after another 2 weeks, then every 2 months   We have open lab Monday through Friday from 8:30-11:30 AM and 1:30-4 PM at the office of Dr. Bo Merino.   The office is located at [address] [place], [place], [place] [address] No appointment is necessary.   Labs are drawn by [facility].  You may receive a bill from [facility] for your lab work. If you have any questions regarding directions or hours of operation,  please call [phone].     Methotrexate tablets What is this medicine? METHOTREXATE (METH oh TREX ate) is a chemotherapy drug used to treat cancer including breast cancer, leukemia, and lymphoma. This medicine can also be used to treat psoriasis and certain kinds of arthritis. This medicine may be used for other purposes; ask your health care provider or pharmacist if you have questions. COMMON BRAND NAME(S): Rheumatrex, Trexall What should I tell my health care provider before I take this medicine? They need to know if you have any of these conditions: -fluid in the stomach area or lungs -if you often drink alcohol -infection or immune system problems -kidney disease or on hemodialysis -liver disease -low blood counts, like low white cell, platelet, or red cell counts -lung disease -radiation therapy -stomach ulcers -ulcerative colitis -an unusual or allergic reaction to methotrexate, other medicines, foods, dyes, or preservatives -pregnant or trying to get pregnant -breast-feeding How should I use this medicine? Take this medicine by mouth with a glass of water. Follow the directions on the prescription label. Take your medicine at regular intervals. Do not take it more often than directed. Do not stop taking except on your doctor's advice. Make sure you know why you are taking this medicine and how often  you should take it. If this medicine is used for a condition that is not cancer, like arthritis or psoriasis, it should be taken weekly, NOT daily. Taking this medicine more often than directed can cause serious side effects, even death. Talk to your healthcare provider about safe handling and disposal of this medicine. You may need to take special precautions. Talk to your pediatrician regarding the use of this medicine in children. While this drug may be prescribed for selected conditions, precautions do apply. Overdosage: If you think you have taken too much of this medicine contact a poison control center or emergency room at once. NOTE: This medicine is only for you. Do not share this medicine with others. What if I miss a dose? If you miss a dose, talk with your doctor or health care professional. Do not take double or extra doses. What may interact with this medicine? This medicine may interact with the following medication: -acitretin -aspirin and aspirin-like medicines including salicylates -azathioprine -certain antibiotics like penicillins, tetracycline, and chloramphenicol -cyclosporine -gold -hydroxychloroquine -live virus vaccines -NSAIDs, medicines for pain and inflammation, like ibuprofen or naproxen -other cytotoxic agents -penicillamine -phenylbutazone -phenytoin -probenecid -retinoids such as isotretinoin and tretinoin -steroid medicines like prednisone or cortisone -sulfonamides like sulfasalazine and trimethoprim/sulfamethoxazole -theophylline This list may not describe all possible interactions. Give your health care provider a list of all the medicines, herbs, non-prescription drugs, or dietary supplements you use. Also tell them if you smoke, drink alcohol, or use illegal drugs. Some items may interact with your medicine. What should I watch for while using this medicine?  Avoid alcoholic drinks. This medicine can make you more sensitive to the sun. Keep out of  the sun. If you cannot avoid being in the sun, wear protective clothing and use sunscreen. Do not use sun lamps or tanning beds/booths. You may need blood work done while you are taking this medicine. Call your doctor or health care professional for advice if you get a fever, chills or sore throat, or other symptoms of a cold or flu. Do not treat yourself. This drug decreases your body's ability to fight infections. Try to avoid being around people who are sick. This medicine may increase your risk to bruise or bleed. Call your doctor or health care professional if you notice any unusual bleeding. Check with your doctor or health care professional if you get an attack of severe diarrhea, nausea and vomiting, or if you sweat a lot. The loss of too much body fluid can make it dangerous for you to take this medicine. Talk to your doctor about your risk of cancer. You may be more at risk for certain types of cancers if you take this medicine. Both men and women must use effective birth control with this medicine. Do not become pregnant while taking this medicine or until at least 1 normal menstrual cycle has occurred after stopping it. Women should inform their doctor if they wish to become pregnant or think they might be pregnant. Men should not father a child while taking this medicine and for 3 months after stopping it. There is a potential for serious side effects to an unborn child. Talk to your health care professional or pharmacist for more information. Do not breast-feed an infant while taking this medicine. What side effects may I notice from receiving this medicine? Side effects that you should report to your doctor or health care professional as soon as possible: -allergic reactions like skin rash, itching or hives, swelling of the face, lips, or tongue -breathing problems or shortness of breath -diarrhea -dry, nonproductive cough -low blood counts - this medicine may decrease the number of white  blood cells, red blood cells and platelets. You may be at increased risk for infections and bleeding. -mouth sores -redness, blistering, peeling or loosening of the skin, including inside the mouth -signs of infection - fever or chills, cough, sore throat, pain or trouble passing urine -signs and symptoms of bleeding such as bloody or black, tarry stools; red or dark-brown urine; spitting up blood or brown material that looks like coffee grounds; red spots on the skin; unusual bruising or bleeding from the eye, gums, or nose -signs and symptoms of kidney injury like trouble passing urine or change in the amount of urine -signs and symptoms of liver injury like dark yellow or brown urine; general ill feeling or flu-like symptoms; light-colored stools; loss of appetite; nausea; right upper belly pain; unusually weak or tired; yellowing of the eyes or skin Side effects that usually do not require medical attention (report to your doctor or health care professional if they continue or are bothersome): -dizziness -hair loss -tiredness -upset stomach -vomiting This list may not describe all possible side effects. Call your doctor for medical advice about side effects. You may report side effects to FDA at 1-800-FDA-1088. Where should I keep my medicine? Keep out of the reach of children. Store at room temperature between 20 and 25 degrees C (68 and 77 degrees F). Protect from light. Throw away any unused medicine after the expiration date. NOTE: This sheet is a  summary. It may not cover all possible information. If you have questions about this medicine, talk to your doctor, pharmacist, or health care provider.  2018 [employer] Standard (2015-07-23 05:39:22)

## 2017-07-24 NOTE — Progress Notes (Signed)
Office Visit Note  Patient: David Weaver             Date of Birth: [birth year]           MRN: [id number]             PCP: Wardell Honour, MD Referring: Wardell Honour, MD Visit Date: 07/24/2017 Occupation: @GUAROCC @    Subjective:  Medication Management   History of Present Illness: David Weaver is a 66 y.o. male   Who was last seen in our office on 03/31/2017 for systemic lupus erythematosus (+ANA, +Ro, +La, DLE, SICCA SXS ) and high risk prescription (Plaquenil 2 mg twice a day 7 days a week). On the previous visit, it was noted that he was having some synovitis on examination of bilateral hands. Therefore we scheduled the patient to get ultrasound of bilateral hands which was done on 05/06/2017 by Dr. Estanislado Pandy. Her findings on that visit are as follows:Impression: Ultrasound examination of bilateral hands showed active  synovitis in bilateral hands over her MCP joints and wrists joints.  Bilateral median nerves were withinnormal limits.  Today, patient reports that he still has some ongoing pain to bilateral hands and wrists. He has been taking his Plaquenil as prescribed. He states that Dr. Estanislado Pandy advised him that she will discuss treatment options on today's visit.   In addition, patient continues to have fibromyalgia discomfort which he rates as 5 on a scale of 0-10. He does have some fatigue and some insomnia for which he uses trazodone (50 mg, 2 pills, prescribed by his PCP).[name]  Activities of Daily Living:  Patient reports morning stiffness for 20 minute.   Patient Denies nocturnal pain.  Difficulty dressing/grooming: Reports Difficulty climbing stairs: Reports Difficulty getting out of chair: Reports Difficulty using hands for taps, buttons, cutlery, and/or writing: Reports   Review of Systems  Constitutional: Positive for fatigue.  HENT: Negative for mouth sores and mouth dryness.   Eyes: Negative for dryness.  Respiratory: Negative for shortness of breath.    Gastrointestinal: Negative for constipation and diarrhea.  Musculoskeletal: Positive for myalgias and myalgias.  Skin: Negative for sensitivity to sunlight.  Neurological: Negative for memory loss.  Psychiatric/Behavioral: Positive for sleep disturbance.    PMFS History:  Patient Active Problem List   Diagnosis Date Noted  . High risk medication use 03/06/2017  . Paroxysmal atrial fibrillation ([place]) 02/19/2016  . Chronic pain syndrome 02/12/2015  . Left knee DJD   . Allergic rhinitis   . Osteoarthrosis, unspecified whether generalized or localized, lower leg   . Esophageal reflux   . Lupus erythematosus   . Hearing loss   . Right knee DJD   . Essential hypertension, benign 11/01/2012  . Other abnormal glucose 11/01/2012  . Depression 11/01/2012  . Insomnia 11/01/2012    Past Medical History:  Diagnosis Date  . Alcohol abuse, daily use   . Allergic rhinitis, cause unspecified   . Dysthymic disorder   . Esophageal reflux   . Essential hypertension, benign   . Fibromyalgia    followed by [name] every six months.  . Impotence of organic origin   . Insomnia, unspecified   . Left knee DJD   . Lupus erythematosus   . Osteoarthrosis, unspecified whether generalized or localized, lower leg   . Other abnormal glucose   . Other specified disorder of male genital organs(608.89)   . Unspecified hearing loss   . Unspecified vitamin D deficiency     Family History  Problem Relation Age of Onset  . Alzheimer's disease Mother   . Osteoporosis Mother   . Heart disease Father 37       AMI  . Alcohol abuse Father   . Cancer Brother        colon cancer  . Diabetes Brother   . Kidney failure Brother    Past Surgical History:  Procedure Laterality Date  . CARPAL TUNNEL RELEASE     bilateral  . CHOLECYSTECTOMY    . COLONOSCOPY  03/01/2014   two polyps; Dr. Benson Norway.  Repeat 5 years.  [name] GANGLION CYST EXCISION  1980   Left wrist  . JOINT REPLACEMENT  01/2013   right knee, left  knee. [facility].  [name] KNEE ARTHROSCOPY  2008   Right  . lumbar spine epidural  2009   [facility]  . ROTATOR CUFF REPAIR  10/2008   right  . TOTAL KNEE ARTHROPLASTY  12/27/2012   Procedure: TOTAL KNEE ARTHROPLASTY;  Surgeon: [name], MD;  Location: [facility];  Service: Orthopedics;  Laterality: Right;  RIGHT ARTHROPLASTY KNEE MEDIAL AND LATERAL COMPARTMENTS WITH PATELLA RESURFACING, RIGHT TOTAL KNEE REPLACEMENT  . TOTAL KNEE ARTHROPLASTY Left 01/31/2013   Procedure: TOTAL KNEE ARTHROPLASTY;  Surgeon: [name], MD;  Location: [facility];  Service: Orthopedics;  Laterality: Left;   Social History   Social History Narrative   Guns in the home stored in locked cabinet.       Caffeine use: 1 serving/ day.     Marital status:  Married x 25 years, third marriage, happily married.      Children: one son (31); one step-daughter; two grandsons      Lives: with wife. Mother-in-law going to move in in 2017.      Employment:  Works at [employer] on [employer]; [employer] work; happy; 25 hours per week.      Tobacco: none currently; quit 2013.  Smoked x 40 years.      Alcohol:  2 beers per night in 2017; much heavier use in fifties.      Drugs: none      Exercise:  None in 2017.      Seatbelt: 100%      ADLs: independent with ADLs in 2017; no assistant devices      Advanced Directives: none in 2017; FULL CODE in 2017; no prolonged measures     Objective: Vital Signs: BP 120/68   Pulse 72   Resp 15   Ht 5' 8.5" (1.74 m)   Wt 190 lb (86.2 kg)   BMI 28.47 kg/m    Physical Exam  Constitutional: He is oriented to person, place, and time. He appears well-developed and well-nourished.  HENT:  Head: Normocephalic and atraumatic.  Eyes: Pupils are equal, round, and reactive to light. Conjunctivae and EOM are normal.  Neck: Normal range of motion. Neck supple.  Cardiovascular: Normal rate, regular rhythm and normal heart sounds.  Exam reveals no gallop and no friction rub.   No murmur  heard. Pulmonary/Chest: Effort normal and breath sounds normal. No respiratory distress. He has no wheezes. He has no rales. He exhibits no tenderness.  Abdominal: Soft. He exhibits no distension and no mass. There is no tenderness. There is no guarding.  Musculoskeletal: Normal range of motion.  Lymphadenopathy:    He has no cervical adenopathy.  Neurological: He is alert and oriented to person, place, and time. He exhibits normal muscle tone. Coordination normal.  Skin: Skin is warm and  dry. Capillary refill takes less than 2 seconds. No rash noted.  Psychiatric: He has a normal mood and affect. His behavior is normal. Judgment and thought content normal.  Vitals reviewed.    Musculoskeletal Exam:  Full range of motion of all joints Grip strength is equal and strong bilaterally Fiber myalgia tender points are 6 out of 18 positive.  CDAI Exam: CDAI Homunculus Exam:   Tenderness:  Right hand: 2nd MCP, 3rd MCP and 5th MCP Left hand: 2nd MCP, 3rd MCP and 5th MCP  Swelling:  Right hand: 2nd MCP, 3rd MCP and 5th MCP Left hand: 2nd MCP, 3rd MCP and 5th MCP  Joint Counts:  CDAI Tender Joint count: 6 CDAI Swollen Joint count: 6     Investigation: No additional findings. Office Visit on 05/15/2017  Component Date Value Ref Range Status  . Strep A Culture 05/15/2017 Negative   Final  . Rapid Strep A Screen 05/15/2017 Negative  Negative Final  Office Visit on 03/03/2017  Component Date Value Ref Range Status  . WBC 03/03/2017 4.7  3.4 - 10.8 x10E3/uL Final  . RBC 03/03/2017 5.37  4.14 - 5.80 x10E6/uL Final  . Hemoglobin 03/03/2017 15.5  13.0 - 17.7 g/dL Final  . Hematocrit 03/03/2017 46.0  37.5 - 51.0 % Final  . MCV 03/03/2017 86  79 - 97 fL Final  . MCH 03/03/2017 28.9  26.6 - 33.0 pg Final  . MCHC 03/03/2017 33.7  31.5 - 35.7 g/dL Final  . RDW 03/03/2017 14.0  12.3 - 15.4 % Final  . Platelets 03/03/2017 265  150 - 379 x10E3/uL Final  . Neutrophils 03/03/2017 57  Not Estab.  % Final  . Lymphs 03/03/2017 27  Not Estab. % Final  . Monocytes 03/03/2017 11  Not Estab. % Final  . Eos 03/03/2017 4  Not Estab. % Final  . Basos 03/03/2017 1  Not Estab. % Final  . Neutrophils Absolute 03/03/2017 2.7  1.4 - 7.0 x10E3/uL Final  . Lymphocytes Absolute 03/03/2017 1.3  0.7 - 3.1 x10E3/uL Final  . Monocytes Absolute 03/03/2017 0.5  0.1 - 0.9 x10E3/uL Final  . EOS (ABSOLUTE) 03/03/2017 0.2  0.0 - 0.4 x10E3/uL Final  . Basophils Absolute 03/03/2017 0.0  0.0 - 0.2 x10E3/uL Final  . Immature Granulocytes 03/03/2017 0  Not Estab. % Final  . Immature Grans (Abs) 03/03/2017 0.0  0.0 - 0.1 x10E3/uL Final  . Glucose 03/03/2017 113* 65 - 99 mg/dL Final  . BUN 03/03/2017 10  8 - 27 mg/dL Final  . Creatinine, Ser 03/03/2017 0.69* 0.76 - 1.27 mg/dL Final  . GFR calc non Af Amer 03/03/2017 98  >59 mL/min/1.73 Final  . GFR calc Af Amer 03/03/2017 113  >59 mL/min/1.73 Final  . BUN/Creatinine Ratio 03/03/2017 14  10 - 24 Final  . Sodium 03/03/2017 141  134 - 144 mmol/L Final  . Potassium 03/03/2017 4.6  3.5 - 5.2 mmol/L Final  . Chloride 03/03/2017 101  96 - 106 mmol/L Final  . CO2 03/03/2017 22  18 - 29 mmol/L Final  . Calcium 03/03/2017 9.2  8.6 - 10.2 mg/dL Final  . Total Protein 03/03/2017 6.5  6.0 - 8.5 g/dL Final  . Albumin 03/03/2017 4.4  3.6 - 4.8 g/dL Final  . Globulin, Total 03/03/2017 2.1  1.5 - 4.5 g/dL Final  . Albumin/Globulin Ratio 03/03/2017 2.1  1.2 - 2.2 Final  . Bilirubin Total 03/03/2017 0.3  0.0 - 1.2 mg/dL Final  . Alkaline Phosphatase 03/03/2017 78  39 -  117 IU/L Final  . AST 03/03/2017 19  0 - 40 IU/L Final  . ALT 03/03/2017 28  0 - 44 IU/L Final  . Hgb A1c MFr Bld 03/03/2017 6.1* 4.8 - 5.6 % Final   Comment:          Pre-diabetes: 5.7 - 6.4          Diabetes: >6.4          Glycemic control for adults with diabetes: <7.0   . Est. average glucose Bld gHb Est-m* 03/03/2017 128  mg/dL Final  . Color, UA 03/03/2017 yellow  yellow Final  . Clarity, UA 03/03/2017  clear  clear Final  . Glucose, UA 03/03/2017 negative  negative Final  . Bilirubin, UA 03/03/2017 negative  negative Final  . Ketones, POC UA 03/03/2017 negative  negative Final  . Spec Grav, UA 03/03/2017 1.020  1.030 - 1.035 Final  . Blood, UA 03/03/2017 negative  negative Final  . pH, UA 03/03/2017 7.0  5.0 - 8.0 Final  . Protein Ur, POC 03/03/2017 negative  negative Final  . Urobilinogen, UA 03/03/2017 0.2  Negative - 2.0 Final  . Nitrite, UA 03/03/2017 Negative  Negative Final  . Leukocytes, UA 03/03/2017 Negative  Negative Final     Imaging: Mr Lumbar Spine Wo Contrast  Result Date: 07/10/2017 CLINICAL DATA:  Left leg and hip pain for 2 years. Burning sensation in the legs. Left-sided sciatica. EXAM: MRI LUMBAR SPINE WITHOUT CONTRAST TECHNIQUE: Multiplanar, multisequence MR imaging of the lumbar spine was performed. No intravenous contrast was administered. COMPARISON:  MRI lumbar spine 03/17/2013 FINDINGS: Segmentation: 5 non rib-bearing lumbar type vertebral bodies are present. Alignment: AP alignment is anatomic. Levoconvex curvature of the lumbar spine is centered at L3-4. Vertebrae: Tear body heights are normal. Asymmetric endplate marrow changes are noted on the right at L3-4 and to lesser extent at L2-3 and L4-5. Marrow signal is otherwise within normal limits. Conus medullaris: Extends to the T12 level and appears normal. Paraspinal and other soft tissues: Limited imaging of the abdomen is unremarkable. No significant adenopathy is present. Paraspinous musculature is within normal limits. Disc levels: L1-2: Slight leftward disc bulging is present without significant stenosis. L2-3: A leftward disc protrusion is present. Asymmetric left-sided facet hypertrophy is similar the prior exam. Mild left subarticular and foraminal stenosis is similar the prior study. L3-4: A broad-based disc protrusion is present. Moderate facet hypertrophy is noted. Moderate right and mild left subarticular  narrowing demonstrates slight progression. Moderate right and mild left foraminal narrowing demonstrates slight progression. L4-5: A broad-based disc protrusion is present. The mild subarticular narrowing bilaterally is stable. There is slight progression of mild foraminal narrowing bilaterally, worse on the right. L5-S1: Asymmetric left-sided facet hypertrophy demonstrate some progression. A broad-based disc protrusion is noted. Mild subarticular narrowing is new, left greater than right. The foramina are patent. IMPRESSION: 1. Multilevel spondylosis of the lumbar spine as described. 2. Moderate right and mild left subarticular and foraminal narrowing at L3-4 demonstrates slight progression. 3. The progressive facet hypertrophy and disc protrusion at L5-S1 with mild subarticular narrowing, left greater than right. 4. Slight progression of mild foraminal narrowing bilaterally at L4-5 is worse on the right. 5. Mild subarticular narrowing bilaterally at L4-5 is stable. 6. Mild left subarticular and foraminal stenosis at L2-3 is stable. Electronically Signed   By: San Morelle M.D.   On: 07/10/2017 10:13    Speciality Comments: No specialty comments available.    Procedures:  No procedures performed Allergies:  Patient has no known allergies.   Assessment / Plan:     Visit Diagnoses: Lupus erythematosus, unspecified form - Plan: CBC with Differential/Platelet, COMPLETE METABOLIC PANEL WITH GFR, Urinalysis, Routine w reflex microscopic, CBC with Differential/Platelet, COMPLETE METABOLIC PANEL WITH GFR, CBC with Differential/Platelet, COMPLETE METABOLIC PANEL WITH GFR, Quantiferon tb gold assay (blood), Protein electrophoresis, serum, IgG, IgA, IgM, Urinalysis, Routine w reflex microscopic  High risk medication use - Plan: CBC with Differential/Platelet, COMPLETE METABOLIC PANEL WITH GFR, Urinalysis, Routine w reflex microscopic, CBC with Differential/Platelet, COMPLETE METABOLIC PANEL WITH GFR, CBC  with Differential/Platelet, COMPLETE METABOLIC PANEL WITH GFR, Quantiferon tb gold assay (blood), Protein electrophoresis, serum, IgG, IgA, IgM, Urinalysis, Routine w reflex microscopic  Fibromyalgia - Plan: CBC with Differential/Platelet, COMPLETE METABOLIC PANEL WITH GFR, Urinalysis, Routine w reflex microscopic  Fatigue, unspecified type - Plan: CBC with Differential/Platelet, COMPLETE METABOLIC PANEL WITH GFR, Urinalysis, Routine w reflex microscopic  Insomnia, unspecified type  Medication monitoring encounter - Plan: CBC with Differential/Platelet, COMPLETE METABOLIC PANEL WITH GFR, CBC with Differential/Platelet, COMPLETE METABOLIC PANEL WITH GFR, Quantiferon tb gold assay (blood), Protein electrophoresis, serum, IgG, IgA, IgM, Urinalysis, Routine w reflex microscopic   Plan: #1: Systemic lupus erythematosus. Having some synovitis to bilateral hands as noted above in history of present illness. Synovitis noted on ultrasound done by Dr. Estanislado Pandy on June 2018. She advised the patient that today we will see how he's doing since his last visit and had treatment if necessary. Today after examining his patient. We decided to add methotrexate to the Plaquenil he started taking at 200 mg twice a day 7 days a week. Patient is agreeable.  His last hepatitis panel was done in March or change thousand and 9 seen in [facility]. Although was negative we need to updated. Patient is up-to-date on his shingles vaccine His chest x-ray was negative in 2016 but we may need to update that Patient has been advised of pneumonia vaccine which she will get before starting his methotrexate We will do CBC with differential, CMP with GFR, TB goal, hepatitis panel, immunoglobulins, SPEP today.  Methotrexate 2.5 mg, 6 pills once a week Patient has been advised to taper for 4 mg to 6 mg after doing labs. This has been discussed thoroughly and patient understands and is agreeable. He will need labs every 2 weeks 2 and he  will stay on 6 pills per week.  He will get pneumonia shot prior to starting methotrexate  Handout and consent on methotrexate  #2: High risk prescription On Plaquenil already 200 mg twice a daY, 7 days a week Inadequate response therefore we adding methotrexate Synovitis noted on ultrasound Added methotrexate, patient will eventually take 6 pills per week for now; dispensed 24 pills with 2 refills  #3: Synovitis noted to bilateral hands noted in homunculus.  #4: Fibromyalgia syndrome Active disease with generalized pain Has 6 tender points noted on the anterior chest area  #5: History of insomnia. Taking trazodone 50 mg, 2 pills daily at bedtime when necessary; gets this medication from PCP  #6: Return to clinic in 3 months for follow-up At that time we will reassess to see how well he is responding to methotrexate, see if he has any side effects or reactions, confirm that he is responding well to the medication.    Orders: Orders Placed This Encounter  Procedures  . CBC with Differential/Platelet  . COMPLETE METABOLIC PANEL WITH GFR  . Urinalysis, Routine w reflex microscopic  . CBC with Differential/Platelet  . COMPLETE  METABOLIC PANEL WITH GFR  . CBC with Differential/Platelet  . COMPLETE METABOLIC PANEL WITH GFR  . Quantiferon tb gold assay (blood)  . Protein electrophoresis, serum  . IgG, IgA, IgM  . Urinalysis, Routine w reflex microscopic   Meds ordered this encounter  Medications  . methotrexate (RHEUMATREX) 2.5 MG tablet    Sig: Take 6 tablets (15 mg total) by mouth once a week. Caution:Chemotherapy. Protect from light.    Dispense:  24 tablet    Refill:  2    Order Specific Question:   Supervising Provider    Answer:   Bo Merino [[id number]]  . folic acid (FOLVITE) 1 MG tablet    Sig: Take 2 tablets (2 mg total) by mouth daily.    Dispense:  60 tablet    Refill:  5    Order Specific Question:   Supervising Provider    Answer:   Bo Merino  [phone]    Face-to-face time spent with patient was 30 minutes. 50% of time was spent in counseling and coordination of care.  Follow-Up Instructions: Return in about 3 months (around 10/24/2017) for SLE,.   Eliezer Lofts, PA-C  Note - This record has been created using [employer].  Chart creation errors have been sought, but may not always  have been located. Such creation errors do not reflect on  the standard of medical care.

## 2017-07-25 LAB — COMPLETE METABOLIC PANEL WITH GFR
ALBUMIN: 4.4 g/dL (ref 3.6–5.1)
ALK PHOS: 112 U/L (ref 40–115)
ALT: 59 U/L — ABNORMAL HIGH (ref 9–46)
AST: 67 U/L — ABNORMAL HIGH (ref 10–35)
BUN: 15 mg/dL (ref 7–25)
CALCIUM: 9 mg/dL (ref 8.6–10.3)
CHLORIDE: 104 mmol/L (ref 98–110)
CO2: 21 mmol/L (ref 20–32)
Creat: 0.69 mg/dL — ABNORMAL LOW (ref 0.70–1.25)
GFR, Est Non African American: >89 mL/min (ref >=60)
Glucose, Bld: 96 mg/dL (ref 65–99)
POTASSIUM: 4.1 mmol/L (ref 3.5–5.3)
Sodium: 139 mmol/L (ref 135–146)
Total Bilirubin: 0.8 mg/dL (ref 0.2–1.2)
Total Protein: 6.6 g/dL (ref 6.1–8.1)

## 2017-07-25 LAB — HEPATITIS PANEL, ACUTE
HCV Ab: NONREACTIVE
HEP A IGM: NONREACTIVE
HEP B C IGM: NONREACTIVE
Hepatitis B Surface Ag: NONREACTIVE

## 2017-07-25 LAB — URINALYSIS, ROUTINE W REFLEX MICROSCOPIC
Bilirubin Urine: NEGATIVE
GLUCOSE, UA: NEGATIVE
HGB URINE DIPSTICK: NEGATIVE
Ketones, ur: NEGATIVE
LEUKOCYTES UA: NEGATIVE
NITRITE: NEGATIVE
PROTEIN: NEGATIVE
Specific Gravity, Urine: 1.023 (ref 1.001–1.035)
pH: 6.5 (ref 5.0–8.0)

## 2017-07-25 LAB — QUANTIFERON TB GOLD ASSAY (BLOOD)
INTERFERON GAMMA RELEASE ASSAY: NEGATIVE
Mitogen-Nil: >10 IU/mL
QUANTIFERON NIL VALUE: 0.11 [IU]/mL
Quantiferon Tb Ag Minus Nil Value: 0 IU/mL

## 2017-07-27 LAB — IGG, IGA, IGM
IGA: 109 mg/dL (ref 81–463)
IGM, SERUM: 47 mg/dL — AB (ref 48–271)
IgG (Immunoglobin G), Serum: 774 mg/dL (ref 694–1618)

## 2017-07-28 LAB — PROTEIN ELECTROPHORESIS, SERUM
Albumin ELP: 4.5 g/dL (ref 3.8–4.8)
Alpha-1-Globulin: 0.3 g/dL (ref 0.2–0.3)
Alpha-2-Globulin: 0.7 g/dL (ref 0.5–0.9)
BETA GLOBULIN: 0.4 g/dL (ref 0.4–0.6)
Beta 2: 0.2 g/dL (ref 0.2–0.5)
GAMMA GLOBULIN: 0.7 g/dL — AB (ref 0.8–1.7)
TOTAL PROTEIN, SERUM ELECTROPHOR: 6.8 g/dL (ref 6.1–8.1)

## 2017-07-28 NOTE — Progress Notes (Signed)
Please call patient to ask if he took any recent antibiotics or NSAIDs. Will recheck labs in 1 month only CMP

## 2017-07-30 ENCOUNTER — Telehealth: Payer: Self-pay | Admitting: Rheumatology

## 2017-07-30 NOTE — Telephone Encounter (Signed)
Patient started MTX on Monday, and is itching worse since started meds. Patient request something to be called in for itching. Patient does not have a rash. Patient uses [facility] on [address] [place].

## 2017-07-30 NOTE — Telephone Encounter (Signed)
Patient states he started MTX on 07/27/17. Patient states the itching he was experiencing has intensified. Patient states the itching has been going on for about 2 months and he has been using OTC allergy medications for it and it is giving him relief. Patient advised that he is able to take the allergy tablets with the MTX,. Patient verbalized understanding.

## 2017-07-31 ENCOUNTER — Telehealth: Payer: Self-pay | Admitting: Radiology

## 2017-07-31 NOTE — Telephone Encounter (Signed)
There was a voicemail from this patient, looks like David Weaver has already discussed meds with him.

## 2017-08-04 ENCOUNTER — Telehealth: Payer: Self-pay | Admitting: Rheumatology

## 2017-08-04 ENCOUNTER — Ambulatory Visit: Payer: Medicare Other | Admitting: Rheumatology

## 2017-08-04 NOTE — Telephone Encounter (Signed)
David Weaver advised patient should discontinue the MTX. Patient to contact the office if he is having ongoing problems and we will consider putting patient on Imuran.

## 2017-08-04 NOTE — Telephone Encounter (Signed)
Patients wife calling in regards to patient taking first 6 pills of MTX. Per wife patient slept all weekend, nauseous, diarrhea, and confused. Patient did not take meds yesterday. Please call to advise.

## 2017-08-04 NOTE — Telephone Encounter (Signed)
Okay to discontinue methotrexate. If he has ongoing problems we can add Imuran in future.

## 2017-08-04 NOTE — Telephone Encounter (Signed)
Patient took his first dose of MTX last 07/27/17. Patient took 6 tabs, 3 tabs with breakfast and 3 with lunch. Patient's wife states that by Saturday he was so drowsy he slept all weekend. Patient's wife states he was having nausea and diarrhea. She states he also had some confusion. She states this is the only new medication. She states he was due for a second dose yesterday but did not take that dose. Please advise.

## 2017-08-19 ENCOUNTER — Other Ambulatory Visit: Payer: Self-pay | Admitting: Rheumatology

## 2017-08-19 NOTE — Telephone Encounter (Signed)
ok 

## 2017-08-19 NOTE — Telephone Encounter (Signed)
Last Visit: 07/24/17 Next Visit: 11/05/17 Labs: 07/24/17 AST 67 ALT 59 previously normal  PLQ Eye Exam: 03/04/17 WNL   Okay to refill PLQ?

## 2017-08-20 ENCOUNTER — Telehealth: Payer: Self-pay

## 2017-08-20 NOTE — Telephone Encounter (Signed)
Called pt to schedule Medicare Annual Wellness Visit with nurse health advisor prior to upcoming physical with PCP.    Josepha Pigg, B.A.  Care Guide [phone]

## 2017-08-26 ENCOUNTER — Other Ambulatory Visit: Payer: Self-pay | Admitting: Family Medicine

## 2017-08-28 ENCOUNTER — Ambulatory Visit: Payer: Medicare Other

## 2017-09-02 ENCOUNTER — Encounter: Payer: Medicare Other | Admitting: Family Medicine

## 2017-09-02 DIAGNOSIS — D229 Melanocytic nevi, unspecified: Secondary | ICD-10-CM | POA: Diagnosis not present

## 2017-09-02 DIAGNOSIS — L570 Actinic keratosis: Secondary | ICD-10-CM | POA: Diagnosis not present

## 2017-09-02 DIAGNOSIS — L299 Pruritus, unspecified: Secondary | ICD-10-CM | POA: Diagnosis not present

## 2017-09-04 ENCOUNTER — Other Ambulatory Visit: Payer: Self-pay | Admitting: Family Medicine

## 2017-09-07 ENCOUNTER — Ambulatory Visit (INDEPENDENT_AMBULATORY_CARE_PROVIDER_SITE_OTHER): Payer: Medicare Other

## 2017-09-07 VITALS — BP 130/80 | HR 77 | Ht 69.0 in | Wt 191.1 lb

## 2017-09-07 DIAGNOSIS — Z23 Encounter for immunization: Secondary | ICD-10-CM | POA: Diagnosis not present

## 2017-09-07 DIAGNOSIS — Z0000 Encounter for general adult medical examination without abnormal findings: Secondary | ICD-10-CM | POA: Diagnosis not present

## 2017-09-07 DIAGNOSIS — I10 Essential (primary) hypertension: Secondary | ICD-10-CM

## 2017-09-07 DIAGNOSIS — E7439 Other disorders of intestinal carbohydrate absorption: Secondary | ICD-10-CM

## 2017-09-07 NOTE — Patient Instructions (Addendum)
[name] , Thank you for taking time to come for your Medicare Wellness Visit. I appreciate your ongoing commitment to your health goals. Please review the following plan we discussed and let me know if I can assist you in the future.   Screening recommendations/referrals: Colonoscopy: up to date, next due 03/14/2024 Recommended yearly ophthalmology/optometry visit for glaucoma screening and checkup Recommended yearly dental visit for hygiene and checkup  Vaccinations: Influenza vaccine: administered today  Pneumococcal vaccine: up to date Tdap vaccine: up to date, next due 02/02/2022 Shingles vaccine: up to date    Advanced directives: Advance directive discussed with you today. Even though you declined this today please call our office should you change your mind and we can give you the proper paperwork for you to fill out.   Conditions/risks identified: Try to lose weight and get back to 175 lbs in the near future. Start walking, exercising more and watching your diet.   Next appointment: 09/15/67 @ 9:40 am with Dr. Tamala Weaver   Preventive Care 68 Years and Older, Male Preventive care refers to lifestyle choices and visits with your health care provider that can promote health and wellness. What does preventive care include?  A yearly physical exam. This is also called an annual well check.  Dental exams once or twice a year.  Routine eye exams. Ask your health care provider how often you should have your eyes checked.  Personal lifestyle choices, including:  Daily care of your teeth and gums.  Regular physical activity.  Eating a healthy diet.  Avoiding tobacco and drug use.  Limiting alcohol use.  Practicing safe sex.  Taking low doses of aspirin every day.  Taking vitamin and mineral supplements as recommended by your health care provider. What happens during an annual well check? The services and screenings done by your health care provider during your annual well  check will depend on your age, overall health, lifestyle risk factors, and family history of disease. Counseling  Your health care provider may ask you questions about your:  Alcohol use.  Tobacco use.  Drug use.  Emotional well-being.  Home and relationship well-being.  Sexual activity.  Eating habits.  History of falls.  Memory and ability to understand (cognition).  Work and work Statistician. Screening  You may have the following tests or measurements:  Height, weight, and BMI.  Blood pressure.  Lipid and cholesterol levels. These may be checked every 5 years, or more frequently if you are over 76 years old.  Skin check.  Lung cancer screening. You may have this screening every year starting at age 68 if you have a 30-pack-year history of smoking and currently smoke or have quit within the past 15 years.  Fecal occult blood test (FOBT) of the stool. You may have this test every year starting at age 68.  Flexible sigmoidoscopy or colonoscopy. You may have a sigmoidoscopy every 5 years or a colonoscopy every 10 years starting at age 68.  Prostate cancer screening. Recommendations will vary depending on your family history and other risks.  Hepatitis C blood test.  Hepatitis B blood test.  Sexually transmitted disease (STD) testing.  Diabetes screening. This is done by checking your blood sugar (glucose) after you have not eaten for a while (fasting). You may have this done every 1-3 years.  Abdominal aortic aneurysm (AAA) screening. You may need this if you are a current or former smoker.  Osteoporosis. You may be screened starting at age 18 if you are  at high risk. Talk with your health care provider about your test results, treatment options, and if necessary, the need for more tests. Vaccines  Your health care provider may recommend certain vaccines, such as:  Influenza vaccine. This is recommended every year.  Tetanus, diphtheria, and acellular  pertussis (Tdap, Td) vaccine. You may need a Td booster every 10 years.  Zoster vaccine. You may need this after age 26.  Pneumococcal 13-valent conjugate (PCV13) vaccine. One dose is recommended after age 68.  Pneumococcal polysaccharide (PPSV23) vaccine. One dose is recommended after age 68. Talk to your health care provider about which screenings and vaccines you need and how often you need them. This information is not intended to replace advice given to you by your health care provider. Make sure you discuss any questions you have with your health care provider. Document Released: 12/14/2015 Document Revised: 08/06/2016 Document Reviewed: 09/18/2015 Elsevier Interactive Patient Education  2017 [employer] Prevention in the Home Falls can cause injuries. They can happen to people of all ages. There are many things you can do to make your home safe and to help prevent falls. What can I do on the outside of my home?  Regularly fix the edges of walkways and driveways and fix any cracks.  Remove anything that might make you trip as you walk through a door, such as a raised step or threshold.  Trim any bushes or trees on the path to your home.  Use bright outdoor lighting.  Clear any walking paths of anything that might make someone trip, such as rocks or tools.  Regularly check to see if handrails are loose or broken. Make sure that both sides of any steps have handrails.  Any raised decks and porches should have guardrails on the edges.  Have any leaves, snow, or ice cleared regularly.  Use sand or salt on walking paths during winter.  Clean up any spills in your garage right away. This includes oil or grease spills. What can I do in the bathroom?  Use night lights.  Install grab bars by the toilet and in the tub and shower. Do not use towel bars as grab bars.  Use non-skid mats or decals in the tub or shower.  If you need to sit down in the shower, use a plastic,  non-slip stool.  Keep the floor dry. Clean up any water that spills on the floor as soon as it happens.  Remove soap buildup in the tub or shower regularly.  Attach bath mats securely with double-sided non-slip rug tape.  Do not have throw rugs and other things on the floor that can make you trip. What can I do in the bedroom?  Use night lights.  Make sure that you have a light by your bed that is easy to reach.  Do not use any sheets or blankets that are too big for your bed. They should not hang down onto the floor.  Have a firm chair that has side arms. You can use this for support while you get dressed.  Do not have throw rugs and other things on the floor that can make you trip. What can I do in the kitchen?  Clean up any spills right away.  Avoid walking on wet floors.  Keep items that you use a lot in easy-to-reach places.  If you need to reach something above you, use a strong step stool that has a grab bar.  Keep electrical cords out of  the way.  Do not use floor polish or wax that makes floors slippery. If you must use wax, use non-skid floor wax.  Do not have throw rugs and other things on the floor that can make you trip. What can I do with my stairs?  Do not leave any items on the stairs.  Make sure that there are handrails on both sides of the stairs and use them. Fix handrails that are broken or loose. Make sure that handrails are as long as the stairways.  Check any carpeting to make sure that it is firmly attached to the stairs. Fix any carpet that is loose or worn.  Avoid having throw rugs at the top or bottom of the stairs. If you do have throw rugs, attach them to the floor with carpet tape.  Make sure that you have a light switch at the top of the stairs and the bottom of the stairs. If you do not have them, ask someone to add them for you. What else can I do to help prevent falls?  Wear shoes that:  Do not have high heels.  Have rubber  bottoms.  Are comfortable and fit you well.  Are closed at the toe. Do not wear sandals.  If you use a stepladder:  Make sure that it is fully opened. Do not climb a closed stepladder.  Make sure that both sides of the stepladder are locked into place.  Ask someone to hold it for you, if possible.  Clearly mark and make sure that you can see:  Any grab bars or handrails.  First and last steps.  Where the edge of each step is.  Use tools that help you move around (mobility aids) if they are needed. These include:  Canes.  Walkers.  Scooters.  Crutches.  Turn on the lights when you go into a dark area. Replace any light bulbs as soon as they burn out.  Set up your furniture so you have a clear path. Avoid moving your furniture around.  If any of your floors are uneven, fix them.  If there are any pets around you, be aware of where they are.  Review your medicines with your doctor. Some medicines can make you feel dizzy. This can increase your chance of falling. Ask your doctor what other things that you can do to help prevent falls. This information is not intended to replace advice given to you by your health care provider. Make sure you discuss any questions you have with your health care provider. Document Released: 09/13/2009 Document Revised: 04/24/2016 Document Reviewed: 12/22/2014 Elsevier Interactive Patient Education  2017 [employer].

## 2017-09-07 NOTE — Progress Notes (Signed)
Subjective:   David Weaver is a 66 y.o. male who presents for Medicare Annual/Subsequent preventive examination.  Review of Systems:  N/A Cardiac Risk Factors include: advanced age (>85men, >12 women);hypertension;male gender;sedentary lifestyle     Objective:    Vitals: BP 130/80   Pulse 77   Ht 5\' 9"  (1.753 m)   Wt 191 lb 2 oz (86.7 kg)   SpO2 96%   BMI 28.22 kg/m   Body mass index is 28.22 kg/m.  Tobacco History  Smoking Status  . Former Smoker  . Packs/day: 2.00  . Years: 30.00  . Types: Cigarettes  . Quit date: 12/28/2008  Smokeless Tobacco  . Never Used    Comment: quit 12 /2011     Counseling given: Not Answered   Past Medical History:  Diagnosis Date  . Alcohol abuse, daily use   . Allergic rhinitis, cause unspecified   . Dysthymic disorder   . Esophageal reflux   . Essential hypertension, benign   . Fibromyalgia    followed by [name] every six months.  . Impotence of organic origin   . Insomnia, unspecified   . Left knee DJD   . Lupus erythematosus   . Osteoarthrosis, unspecified whether generalized or localized, lower leg   . Other abnormal glucose   . Other specified disorder of male genital organs(608.89)   . Unspecified hearing loss   . Unspecified vitamin D deficiency    Past Surgical History:  Procedure Laterality Date  . CARPAL TUNNEL RELEASE     bilateral  . CHOLECYSTECTOMY    . COLONOSCOPY  03/01/2014   two polyps; Dr. Benson [place].  Repeat 5 years.  [name] GANGLION CYST EXCISION  1980   Left wrist  . JOINT REPLACEMENT  01/2013   right knee, left knee. [facility].  [name] KNEE ARTHROSCOPY  2008   Right  . lumbar spine epidural  2009   [facility]  . ROTATOR CUFF REPAIR  10/2008   right  . TOTAL KNEE ARTHROPLASTY  12/27/2012   Procedure: TOTAL KNEE ARTHROPLASTY;  Surgeon: [name], MD;  Location: [facility];  Service: Orthopedics;  Laterality: Right;  RIGHT ARTHROPLASTY KNEE MEDIAL AND LATERAL COMPARTMENTS WITH PATELLA RESURFACING,  RIGHT TOTAL KNEE REPLACEMENT  . TOTAL KNEE ARTHROPLASTY Left 01/31/2013   Procedure: TOTAL KNEE ARTHROPLASTY;  Surgeon: [name], MD;  Location: [facility];  Service: Orthopedics;  Laterality: Left;   Family History  Problem Relation Age of Onset  . Alzheimer's disease Mother   . Osteoporosis Mother   . Heart disease Father 18       AMI  . Alcohol abuse Father   . Cancer Brother        colon cancer  . Diabetes Brother   . Kidney failure Brother    History  Sexual Activity  . Sexual activity: Yes  . Birth control/ protection: Post-menopausal    Outpatient Encounter Prescriptions as of 09/07/2017  Medication Sig  . amLODipine (NORVASC) 10 MG tablet Take 1 tablet (10 mg total) by mouth daily.  . Cyanocobalamin (VITAMIN B12 PO) Take by mouth daily.  . diclofenac sodium (VOLTAREN) 1 % GEL APPLY 2 TO 4 GRAMS TO AFFECTED AREA 4 TIMES DAILY  . DULoxetine (CYMBALTA) 60 MG capsule Take 1 capsule (60 mg total) by mouth 2 (two) times daily.  . hydroxychloroquine (PLAQUENIL) 200 MG tablet TAKE 1 TABLET BY MOUTH TWICE A DAY  . methocarbamol (ROBAXIN) 500 MG tablet TAKE 1 TO 2 TABLETS BY MOUTH  EVERY 8 HOURS AS NEEDED FOR MUSCLEPASMS  . metoprolol succinate (TOPROL-XL) 50 MG 24 hr tablet TAKE 1 TABLET BY MOUTH EVERY DAY  . omeprazole (PRILOSEC) 20 MG capsule TAKE 1 CAPSULE BY MOUTH DAILY.  [name] omeprazole (PRILOSEC) 20 MG capsule TAKE ONE CAPSULE BY MOUTH EVERY DAY  . oxyCODONE-acetaminophen (PERCOCET/ROXICET) 5-325 MG tablet Take 1 tablet by mouth every 4 (four) hours as needed. for pain  . rivaroxaban (XARELTO) 20 MG TABS tablet Take 1 tablet (20 mg total) by mouth daily with supper.  . traZODone (DESYREL) 50 MG tablet TAKE 1 TO 2 TABLETS BY MOUTH AT BEDTIME AS NEEDED FOR SLEEP  . Vitamin D, Ergocalciferol, (DRISDOL) 50000 units CAPS capsule TAKE ONE CAPSULE BY MOUTH EVERY 7 DAYS  . [DISCONTINUED] folic acid (FOLVITE) 1 MG tablet Take 2 tablets (2 mg total) by mouth daily.  . [DISCONTINUED]  methotrexate (RHEUMATREX) 2.5 MG tablet Take 6 tablets (15 mg total) by mouth once a week. Caution:Chemotherapy. Protect from light.   No facility-administered encounter medications on file as of 09/07/2017.     Activities of Daily Living In your present state of health, do you have any difficulty performing the following activities: 09/07/2017  Hearing? Y  Comment Patient wears hearing aids  Vision? N  Difficulty concentrating or making decisions? Y  Comment Patient has issues with short term memory   Walking or climbing stairs? N  Dressing or bathing? N  Doing errands, shopping? N  Preparing Food and eating ? N  Using the Toilet? N  In the past six months, have you accidently leaked urine? N  Do you have problems with loss of bowel control? Y  Comment Patient has experienced times where he has loss of bowel control  Managing your Medications? N  Managing your Finances? N  Housekeeping or managing your Housekeeping? N  Some recent data might be hidden    Patient Care Team: Wardell Honour, MD as PCP - General (Family Medicine) Elsie Saas, MD (Orthopedic Surgery) Carol Ada, MD (Gastroenterology)   Assessment:     Exercise Activities and Dietary recommendations Current Exercise Habits: The patient does not participate in regular exercise at present, Exercise limited by: None identified  Goals    . Weight (lb) < 175 lb (79.4 kg)          Patient states that he wants to lose weight and get back to 175 lbs in the near future.       Fall Risk Fall Risk  09/07/2017 05/15/2017 03/03/2017 10/30/2016 09/02/2016  Falls in the past year? No No No No No  Number falls in past yr: - - - - -  Injury with Fall? - - - - -  Comment - - - - -   Depression Screen PHQ 2/9 Scores 09/07/2017 05/15/2017 03/03/2017 10/30/2016  PHQ - 2 Score 0 0 0 0  PHQ- 9 Score 5 - - -    Cognitive Function     6CIT Screen 09/07/2017  What Year? 0 points  What month? 0 points  What time? 0 points    Count back from 20 0 points  Months in reverse 0 points  Repeat phrase 8 points  Total Score 8    Immunization History  Administered Date(s) Administered  . Influenza, Seasonal, Injecte, Preservative Fre 11/01/2012  . Influenza,inj,Quad PF,6+ Mos 11/21/2013, 10/11/2014, 10/03/2015, 09/02/2016, 09/07/2017  . Influenza-Unspecified 08/20/2009, 08/19/2010, 08/12/2011  . Pneumococcal Conjugate-13 02/12/2015  . Pneumococcal Polysaccharide-23 02/19/2016  . Pneumococcal-Unspecified 03/03/2008  . Td  07/01/2001  . Tdap 02/03/2012  . Zoster 08/12/2011   Screening Tests Health Maintenance  Topic Date Due  . TETANUS/TDAP  02/02/2022  . COLONOSCOPY  03/14/2024  . INFLUENZA VACCINE  Completed  . Hepatitis C Screening  Completed  . PNA vac Low Risk Adult  Completed      Plan:   I have personally reviewed and noted the following in the patient's chart:   . Medical and social history . Use of alcohol, tobacco or illicit drugs  . Current medications and supplements . Functional ability and status . Nutritional status . Physical activity . Advanced directives . List of other physicians . Hospitalizations, surgeries, and ER visits in previous 12 months . Vitals . Screenings to include cognitive, depression, and falls . Referrals and appointments  In addition, I have reviewed and discussed with patient certain preventive protocols, quality metrics, and best practice recommendations. A written personalized care plan for preventive services as well as general preventive health recommendations were provided to patient.  Blood work ordered.    Andrez Grime, LPN  93/08/299

## 2017-09-08 LAB — LIPID PANEL
CHOLESTEROL TOTAL: 237 mg/dL — AB (ref 100–199)
Chol/HDL Ratio: 7.6 ratio — ABNORMAL HIGH (ref 0.0–5.0)
HDL: 31 mg/dL — ABNORMAL LOW (ref >39)
TRIGLYCERIDES: 983 mg/dL — AB (ref 0–149)

## 2017-09-10 ENCOUNTER — Other Ambulatory Visit: Payer: Self-pay | Admitting: Family Medicine

## 2017-09-11 DIAGNOSIS — E7439 Other disorders of intestinal carbohydrate absorption: Secondary | ICD-10-CM | POA: Diagnosis not present

## 2017-09-11 NOTE — Addendum Note (Signed)
Addended by: Gari Crown D on: 09/11/2017 12:56 PM   Modules accepted: Orders

## 2017-09-12 LAB — HEMOGLOBIN A1C
ESTIMATED AVERAGE GLUCOSE: 128 mg/dL
HEMOGLOBIN A1C: 6.1 % — AB (ref 4.8–5.6)

## 2017-09-14 ENCOUNTER — Ambulatory Visit (INDEPENDENT_AMBULATORY_CARE_PROVIDER_SITE_OTHER): Payer: Medicare Other | Admitting: Family Medicine

## 2017-09-14 ENCOUNTER — Encounter: Payer: Self-pay | Admitting: Family Medicine

## 2017-09-14 VITALS — BP 128/62 | HR 91 | Temp 98.0°F | Resp 16 | Ht 66.93 in | Wt 189.0 lb

## 2017-09-14 DIAGNOSIS — I10 Essential (primary) hypertension: Secondary | ICD-10-CM | POA: Diagnosis not present

## 2017-09-14 DIAGNOSIS — Z0000 Encounter for general adult medical examination without abnormal findings: Secondary | ICD-10-CM

## 2017-09-14 DIAGNOSIS — H903 Sensorineural hearing loss, bilateral: Secondary | ICD-10-CM

## 2017-09-14 DIAGNOSIS — I480 Paroxysmal atrial fibrillation: Secondary | ICD-10-CM

## 2017-09-14 DIAGNOSIS — R7309 Other abnormal glucose: Secondary | ICD-10-CM

## 2017-09-14 DIAGNOSIS — K219 Gastro-esophageal reflux disease without esophagitis: Secondary | ICD-10-CM

## 2017-09-14 DIAGNOSIS — R42 Dizziness and giddiness: Secondary | ICD-10-CM

## 2017-09-14 DIAGNOSIS — F5104 Psychophysiologic insomnia: Secondary | ICD-10-CM | POA: Diagnosis not present

## 2017-09-14 DIAGNOSIS — G894 Chronic pain syndrome: Secondary | ICD-10-CM

## 2017-09-14 DIAGNOSIS — J301 Allergic rhinitis due to pollen: Secondary | ICD-10-CM

## 2017-09-14 DIAGNOSIS — F3341 Major depressive disorder, recurrent, in partial remission: Secondary | ICD-10-CM

## 2017-09-14 DIAGNOSIS — L930 Discoid lupus erythematosus: Secondary | ICD-10-CM

## 2017-09-14 DIAGNOSIS — S39011D Strain of muscle, fascia and tendon of abdomen, subsequent encounter: Secondary | ICD-10-CM

## 2017-09-14 DIAGNOSIS — E7439 Other disorders of intestinal carbohydrate absorption: Secondary | ICD-10-CM

## 2017-09-14 LAB — POCT URINALYSIS DIP (MANUAL ENTRY)
Bilirubin, UA: NEGATIVE
GLUCOSE UA: NEGATIVE mg/dL
Ketones, POC UA: NEGATIVE mg/dL
Leukocytes, UA: NEGATIVE
Nitrite, UA: NEGATIVE
PH UA: 6 (ref 5.0–8.0)
Protein Ur, POC: 30 mg/dL — AB
RBC UA: NEGATIVE
UROBILINOGEN UA: 0.2 U/dL

## 2017-09-14 LAB — COMPREHENSIVE METABOLIC PANEL
A/G RATIO: 2 (ref 1.2–2.2)
ALT: 48 IU/L — AB (ref 0–44)
AST: 21 IU/L (ref 0–40)
Albumin: 4.3 g/dL (ref 3.6–4.8)
Alkaline Phosphatase: 86 IU/L (ref 39–117)
BUN/Creatinine Ratio: 20 (ref 10–24)
BUN: 14 mg/dL (ref 8–27)
Bilirubin Total: <0.2 mg/dL (ref 0.0–1.2)
CALCIUM: 8.9 mg/dL (ref 8.6–10.2)
CO2: 18 mmol/L — AB (ref 20–29)
CREATININE: 0.71 mg/dL — AB (ref 0.76–1.27)
Chloride: 100 mmol/L (ref 96–106)
GFR, EST AFRICAN AMERICAN: 111 mL/min/{1.73_m2} (ref >59)
GFR, EST NON AFRICAN AMERICAN: 96 mL/min/{1.73_m2} (ref >59)
Globulin, Total: 2.2 g/dL (ref 1.5–4.5)
Glucose: 124 mg/dL — ABNORMAL HIGH (ref 65–99)
Potassium: 3.9 mmol/L (ref 3.5–5.2)
Sodium: 139 mmol/L (ref 134–144)
TOTAL PROTEIN: 6.5 g/dL (ref 6.0–8.5)

## 2017-09-14 LAB — SPECIMEN STATUS REPORT

## 2017-09-14 NOTE — Patient Instructions (Addendum)
CHECK BLOOD PRESSURE DAILY FOR TWO WEEKS.  Send copy to Dr. [name] for review.   IF you received an x-ray today, you will receive an invoice from [facility]. Please contact [facility] at [phone] with questions or concerns regarding your invoice.   IF you received labwork today, you will receive an invoice from [facility]. Please contact [facility] at [phone] with questions or concerns regarding your invoice.   Our billing staff will not be able to assist you with questions regarding bills from these companies.  You will be contacted with the lab results as soon as they are available. The fastest way to get your results is to activate your My Chart account. Instructions are located on the last page of this paperwork. If you have not heard from [place] regarding the results in 2 weeks, please contact this office.      Preventive Care 68 Years and Older, Male Preventive care refers to lifestyle choices and visits with your health care provider that can promote health and wellness. What does preventive care include?  A yearly physical exam. This is also called an annual well check.  Dental exams once or twice a year.  Routine eye exams. Ask your health care provider how often you should have your eyes checked.  Personal lifestyle choices, including: ? Daily care of your teeth and gums. ? Regular physical activity. ? Eating a healthy diet. ? Avoiding tobacco and drug use. ? Limiting alcohol use. ? Practicing safe sex. ? Taking low doses of aspirin every day. ? Taking vitamin and mineral supplements as recommended by your health care provider. What happens during an annual well check? The services and screenings done by your health care provider during your annual well check will depend on your age, overall health, lifestyle risk factors, and family history of disease. Counseling Your health care provider may ask you questions about your:  Alcohol  use.  Tobacco use.  Drug use.  Emotional well-being.  Home and relationship well-being.  Sexual activity.  Eating habits.  History of falls.  Memory and ability to understand (cognition).  Work and work Statistician.  Screening You may have the following tests or measurements:  Height, weight, and BMI.  Blood pressure.  Lipid and cholesterol levels. These may be checked every 5 years, or more frequently if you are over 60 years old.  Skin check.  Lung cancer screening. You may have this screening every year starting at age 47 if you have a 30-pack-year history of smoking and currently smoke or have quit within the past 15 years.  Fecal occult blood test (FOBT) of the stool. You may have this test every year starting at age 31.  Flexible sigmoidoscopy or colonoscopy. You may have a sigmoidoscopy every 5 years or a colonoscopy every 10 years starting at age 30.  Prostate cancer screening. Recommendations will vary depending on your family history and other risks.  Hepatitis C blood test.  Hepatitis B blood test.  Sexually transmitted disease (STD) testing.  Diabetes screening. This is done by checking your blood sugar (glucose) after you have not eaten for a while (fasting). You may have this done every 1-3 years.  Abdominal aortic aneurysm (AAA) screening. You may need this if you are a current or former smoker.  Osteoporosis. You may be screened starting at age 31 if you are at high risk.  Talk with your health care provider about your test results, treatment options, and if necessary, the need for more tests.  Vaccines Your health care provider may recommend certain vaccines, such as:  Influenza vaccine. This is recommended every year.  Tetanus, diphtheria, and acellular pertussis (Tdap, Td) vaccine. You may need a Td booster every 10 years.  Varicella vaccine. You may need this if you have not been vaccinated.  Zoster vaccine. You may need this after age  67.  Measles, mumps, and rubella (MMR) vaccine. You may need at least one dose of MMR if you were born in 1957 or later. You may also need a second dose.  Pneumococcal 13-valent conjugate (PCV13) vaccine. One dose is recommended after age 57.  Pneumococcal polysaccharide (PPSV23) vaccine. One dose is recommended after age 97.  Meningococcal vaccine. You may need this if you have certain conditions.  Hepatitis A vaccine. You may need this if you have certain conditions or if you travel or work in places where you may be exposed to hepatitis A.  Hepatitis B vaccine. You may need this if you have certain conditions or if you travel or work in places where you may be exposed to hepatitis B.  Haemophilus influenzae type b (Hib) vaccine. You may need this if you have certain risk factors.  Talk to your health care provider about which screenings and vaccines you need and how often you need them. This information is not intended to replace advice given to you by your health care provider. Make sure you discuss any questions you have with your health care provider. Document Released: 12/14/2015 Document Revised: 08/06/2016 Document Reviewed: 09/18/2015 Elsevier Interactive Patient Education  2017 [employer].

## 2017-09-14 NOTE — Progress Notes (Addendum)
Subjective:    Patient ID: David Weaver, male    DOB: 05/30/1951, 66 y.o.   MRN: [id number]  09/14/2017  Annual Exam; Hypertension; Hyperlipidemia; and Gastroesophageal Reflux    HPI This 66 y.o. male presents for Annual Wellness Examination and six month follow-up of hypertension, hypercholesterolemia, glucose intolerance, anxiety/depression.  Last physical:  09/02/2016 Colonoscopy:  2015 PSA:  2017 Eye exam:  2018; good vision; glasses changed. Dental exam:    Every six months.  HTN: Patient reports good compliance with medication, good tolerance to medication, and good symptom control.    Glucose Intolerance:  Non-compliant with diet and exercise.  S/p aortic ultrasound; WNL at [facility].  Dyslipidemia: really elevated triglycerides; also elevated at [facility]; recommended staring Fish Oil; taking two daily.  Abdominal pain LUQ: occurs only when going form supine to sitting; having catching sharp pain in LUQ.  No associated n/v/d/c.  No bloody stools or black stools  Diarrhea and gas: chronic issue; worse when consumes excessive alcohol/beer.    Dizziness: continues to suffer with dizziness for 2-3 minutes each day; concerned medication side effect; never contacted to schedule carotid dopplers.      Visual Acuity Screening   Right eye Left eye Both eyes  Without correction:     With correction: 20/25 20/25 20/25      BP Readings from Last 3 Encounters:  11/05/17 (!) 150/69  09/14/17 128/62  09/07/17 130/80   Wt Readings from Last 3 Encounters:  11/05/17 199 lb (90.3 kg)  09/14/17 189 lb (85.7 kg)  09/07/17 191 lb 2 oz (86.7 kg)   Immunization History  Administered Date(s) Administered  . Influenza, Seasonal, Injecte, Preservative Fre 11/01/2012  . Influenza,inj,Quad PF,6+ Mos 11/21/2013, 10/11/2014, 10/03/2015, 09/02/2016, 09/07/2017  . Influenza-Unspecified 08/20/2009, 08/19/2010, 08/12/2011  . Pneumococcal Conjugate-13 02/12/2015  . Pneumococcal Polysaccharide-23  02/19/2016  . Pneumococcal-Unspecified 03/03/2008  . Td 07/01/2001  . Tdap 02/03/2012  . Zoster 08/12/2011    Review of Systems  Constitutional: Positive for fatigue. Negative for activity change, appetite change, chills, diaphoresis, fever and unexpected weight change.  HENT: Negative for congestion, dental problem, drooling, ear discharge, ear pain, facial swelling, hearing loss, mouth sores, nosebleeds, postnasal drip, rhinorrhea, sinus pressure, sneezing, sore throat, tinnitus, trouble swallowing and voice change.   Eyes: Negative for photophobia, pain, discharge, redness, itching and visual disturbance.  Respiratory: Negative for apnea, cough, choking, chest tightness, shortness of breath, wheezing and stridor.   Cardiovascular: Negative for chest pain, palpitations and leg swelling.  Gastrointestinal: Positive for abdominal distention and diarrhea. Negative for abdominal pain, anal bleeding, blood in stool, constipation, nausea, rectal pain and vomiting.  Endocrine: Negative for cold intolerance, heat intolerance, polydipsia, polyphagia and polyuria.  Genitourinary: Negative for decreased urine volume, difficulty urinating, discharge, dysuria, enuresis, flank pain, frequency, genital sores, hematuria, penile pain, penile swelling, scrotal swelling, testicular pain and urgency.       Nocturia x 0-1.  Stream is weak but stable.  No straining or sitting down.  Musculoskeletal: Positive for arthralgias, back pain, neck pain and neck stiffness. Negative for gait problem, joint swelling and myalgias.  Skin: Negative for color change, pallor, rash and wound.  Allergic/Immunologic: Negative for environmental allergies, food allergies and immunocompromised state.  Neurological: Positive for dizziness. Negative for tremors, seizures, syncope, facial asymmetry, speech difficulty, weakness, light-headedness, numbness and headaches.  Hematological: Negative for adenopathy. Does not bruise/bleed easily.   Psychiatric/Behavioral: Negative for agitation, behavioral problems, confusion, decreased concentration, dysphoric mood, hallucinations, self-injury, sleep disturbance and suicidal ideas.  The patient is not nervous/anxious and is not hyperactive.        Bedtime 9:00; wakes up 6:00am.    Past Medical History:  Diagnosis Date  . Alcohol abuse, daily use   . Allergic rhinitis, cause unspecified   . Dysthymic disorder   . Esophageal reflux   . Essential hypertension, benign   . Fibromyalgia    followed by [name] every six months.  . Fibromyositis   . Impotence of organic origin   . Insomnia, unspecified   . Left knee DJD   . Lupus erythematosus   . Osteoarthrosis, unspecified whether generalized or localized, lower leg   . Other abnormal glucose   . Other specified disorder of male genital organs(608.89)   . Systemic lupus erythematosus ([place])   . Unspecified hearing loss   . Unspecified vitamin D deficiency    Past Surgical History:  Procedure Laterality Date  . CARPAL TUNNEL RELEASE     bilateral  . CHOLECYSTECTOMY    . COLONOSCOPY  03/01/2014   two polyps; Dr. Benson Norway.  Repeat 5 years.  [name] GANGLION CYST EXCISION  1980   Left wrist  . JOINT REPLACEMENT  01/2013   right knee, left knee. [facility].  [name] KNEE ARTHROPLASTY    . KNEE ARTHROSCOPY  2008   Right  . lumbar spine epidural  2009   [facility] [facility]  . ROTATOR CUFF REPAIR  10/2008   right  . TOTAL KNEE ARTHROPLASTY  12/27/2012   Procedure: TOTAL KNEE ARTHROPLASTY;  Surgeon: [name], MD;  Location: [place];  Service: Orthopedics;  Laterality: Right;  RIGHT ARTHROPLASTY KNEE MEDIAL AND LATERAL COMPARTMENTS WITH PATELLA RESURFACING, RIGHT TOTAL KNEE REPLACEMENT  . TOTAL KNEE ARTHROPLASTY Left 01/31/2013   Procedure: TOTAL KNEE ARTHROPLASTY;  Surgeon: [name], MD;  Location: [place];  Service: Orthopedics;  Laterality: Left;  . TOTAL SHOULDER ARTHROPLASTY     No Known Allergies Current Outpatient Medications on  File Prior to Visit  Medication Sig Dispense Refill  . Cyanocobalamin (VITAMIN B12 PO) Take by mouth daily.    . diclofenac sodium (VOLTAREN) 1 % GEL APPLY 2 TO 4 GRAMS TO AFFECTED AREA 4 TIMES DAILY 100 g 4  . hydroxychloroquine (PLAQUENIL) 200 MG tablet TAKE 1 TABLET BY MOUTH TWICE A DAY 180 tablet 1  . methocarbamol (ROBAXIN) 500 MG tablet TAKE 1 TO 2 TABLETS BY MOUTH EVERY 8 HOURS AS NEEDED FOR MUSCLEPASMS 60 tablet 1  . oxyCODONE-acetaminophen (PERCOCET/ROXICET) 5-325 MG tablet Take 1 tablet by mouth every 4 (four) hours as needed. for pain  0  . rivaroxaban (XARELTO) 20 MG TABS tablet Take 1 tablet (20 mg total) by mouth daily with supper. 30 tablet 5  . traZODone (DESYREL) 50 MG tablet TAKE 1 TO 2 TABLETS BY MOUTH AT BEDTIME AS NEEDED FOR SLEEP 180 tablet 3   No current facility-administered medications on file prior to visit.    Social History   Socioeconomic History  . Marital status: Married    Spouse name: Not on file  . Number of children: 1  . Years of education: Not on file  . Highest education level: Not on file  Social Needs  . Financial resource strain: Not on file  . Food insecurity - worry: Not on file  . Food insecurity - inability: Not on file  . Transportation needs - medical: Not on file  . Transportation needs - non-medical: Not on file  Occupational History  . Occupation: Ship broker  Employer: [employer]    Comment: Paints cars/trucks  Tobacco Use  . Smoking status: Former Smoker    Packs/day: 2.00    Years: 30.00    Pack years: 60.00    Types: Cigarettes    Last attempt to quit: 12/28/2008    Years since quitting: 8.8  . Smokeless tobacco: Never Used  . Tobacco comment: quit 12 /2011  Substance and Sexual Activity  . Alcohol use: Yes    Alcohol/week: 6.0 oz    Types: 6 Cans of beer, 4 Standard drinks or equivalent per week  . Drug use: No  . Sexual activity: Yes    Birth control/protection: Post-menopausal  Other Topics Concern  . Not on  file  Social History Narrative   Guns in the home stored in locked cabinet.       Caffeine use: 1 serving/ day.     Marital status:  Married x 25 years, third marriage, happily married.      Children: one son (52); one step-daughter; two grandsons      Lives: with wife. Mother-in-law going to move in in 2017.      Employment:  Works at [employer] on [employer]; [employer] work; happy; 25 hours per week.      Tobacco: none currently; quit 2013.  Smoked x 40 years.      Alcohol:  2-6 beers per night in 2018; much heavier use in fifties.      Drugs: none      Exercise:  None in 2017.      Seatbelt: 100%      ADLs: independent with ADLs in 2017; no assistant devices      Advanced Directives: none in 2017; FULL CODE in 2017; no prolonged measures   Family History  Problem Relation Age of Onset  . Alzheimer's disease Mother   . Osteoporosis Mother   . Heart disease Father 4       AMI  . Alcohol abuse Father   . Cancer Brother        colon cancer  . Diabetes Brother   . Kidney failure Brother        Objective:    BP 128/62   Pulse 91   Temp 98 F (36.7 C) (Oral)   Resp 16   Ht 5' 6.93" (1.7 m)   Wt 189 lb (85.7 kg)   SpO2 96%   BMI 29.66 kg/m  Physical Exam  Constitutional: He is oriented to person, place, and time. He appears well-developed and well-nourished. No distress.  HENT:  Head: Normocephalic and atraumatic.  Right Ear: External ear normal.  Left Ear: External ear normal.  Nose: Nose normal.  Mouth/Throat: Oropharynx is clear and moist.  Eyes: Pupils are equal, round, and reactive to light. Conjunctivae and EOM are normal.  Neck: Normal range of motion. Neck supple. Carotid bruit is not present. No thyromegaly present.  Cardiovascular: Normal rate, regular rhythm, normal heart sounds and intact distal pulses.  Exam reveals no gallop and no friction rub.   No murmur heard. Pulmonary/Chest: Effort normal and breath sounds normal. He has no wheezes. He has no rales.    Abdominal: Soft. Bowel sounds are normal. He exhibits no distension and no mass. There is no tenderness. There is no rebound and no guarding. Hernia confirmed negative in the right inguinal area and confirmed negative in the left inguinal area.  Genitourinary: Testes normal and penis normal.  Lymphadenopathy:    He has no cervical adenopathy.  Neurological:  He is alert and oriented to person, place, and time. No cranial nerve deficit.  Skin: Skin is warm and dry. No rash noted. He is not diaphoretic.  Psychiatric: He has a normal mood and affect. His behavior is normal.  Nursing note and vitals reviewed.  No results found. Depression screen [facility] 2/9 09/14/2017 09/07/2017 05/15/2017 03/03/2017 10/30/2016  Decreased Interest 0 0 0 0 0  Down, Depressed, Hopeless 0 0 0 0 0  PHQ - 2 Score 0 0 0 0 0  Altered sleeping - 0 - - -  Tired, decreased energy - 3 - - -  Change in appetite - 0 - - -  Feeling bad or failure about yourself  - 0 - - -  Trouble concentrating - 2 - - -  Moving slowly or fidgety/restless - 0 - - -  Suicidal thoughts - 0 - - -  PHQ-9 Score - 5 - - -  Difficult doing work/chores - Not difficult at all - - -   Fall Risk  09/14/2017 09/07/2017 05/15/2017 03/03/2017 10/30/2016  Falls in the past year? No No No No No  Number falls in past yr: - - - - -  Injury with Fall? - - - - -  Comment - - - - -   Functional Status Survey: Is the patient deaf or have difficulty hearing?: Yes Does the patient have difficulty seeing, even when wearing glasses/contacts?: No Does the patient have difficulty concentrating, remembering, or making decisions?: No Does the patient have difficulty walking or climbing stairs?: No Does the patient have difficulty dressing or bathing?: No Does the patient have difficulty doing errands alone such as visiting a doctor's office or shopping?: No      Assessment & Plan:   1. Essential hypertension, benign   2. Glucose intolerance   3. Paroxysmal atrial  fibrillation ([name])   4. Seasonal allergic rhinitis due to pollen   5. Gastroesophageal reflux disease without esophagitis   6. Sensorineural hearing loss (SNHL) of both ears   7. Chronic pain syndrome   8. Recurrent major depressive disorder, in partial remission ([place])   9. Psychophysiological insomnia   10. Other abnormal glucose   11. Discoid lupus erythematosus   12. Dizziness   13. Strain of abdominal wall, subsequent encounter     -anticipatory guidance provided --- exercise, weight loss, safe driving practices, aspirin 81mg  daily. -obtain age appropriate screening labs and labs for chronic disease management. -moderate fall risk; being treated for depression; known hearing loss and recently obtained hearing aides [facility].    Discussed advanced directives and living will; also discussed end of life issues including code status.  -persistent dizziness; recommend patient checking BP daily; diastolic BP borderline low; non-compliant with carotid dopplers and declined today; declined referral for MRI brain. -persistent diarrhea; associated with excessive alcohol intake; recommend decreasing alcohol intake. -decrease energy and motivation; recommend daily exercise; obtain labs. -continue current medications. -followed by pain management for chronic pain syndrome due to DDD cervical and lumbar spines. -followed by cardiology for paroxysmal atrial fibrillation. -persistent abdominal wall sprain; pain only with movements going form supine to sitting; pt declined CT abdomen to rule out other concerning pathology; pt declined referral to physical therapy. -followed closely by rheumatology due to fibromyalgia and discoid lupus; recently intolerant to MTX.  Orders Placed This Encounter  Procedures  . Microalbumin / creatinine urine ratio  . POCT urinalysis dipstick  . EKG 12-Lead   No orders of the defined types  were placed in this encounter.   Return in about 4 months (around  01/15/2018) for follow-up chronic medical conditions.   Jachin Coury Elayne Guerin, M.D. Primary Care at [facility] previously [facility] [address] [place], [place]  [address] [phone] phone [phone] fax

## 2017-09-15 LAB — MICROALBUMIN / CREATININE URINE RATIO
Creatinine, Urine: 148.1 mg/dL
MICROALB/CREAT RATIO: 18.4 mg/g{creat} (ref 0.0–30.0)
MICROALBUM., U, RANDOM: 27.2 ug/mL

## 2017-09-17 DIAGNOSIS — M47812 Spondylosis without myelopathy or radiculopathy, cervical region: Secondary | ICD-10-CM | POA: Diagnosis not present

## 2017-09-17 DIAGNOSIS — M150 Primary generalized (osteo)arthritis: Secondary | ICD-10-CM | POA: Diagnosis not present

## 2017-09-17 DIAGNOSIS — M47816 Spondylosis without myelopathy or radiculopathy, lumbar region: Secondary | ICD-10-CM | POA: Diagnosis not present

## 2017-09-17 DIAGNOSIS — M5432 Sciatica, left side: Secondary | ICD-10-CM | POA: Diagnosis not present

## 2017-09-25 ENCOUNTER — Other Ambulatory Visit: Payer: Self-pay | Admitting: Family Medicine

## 2017-09-25 NOTE — Telephone Encounter (Signed)
Rx has been sent  

## 2017-09-25 NOTE — Telephone Encounter (Signed)
Is it ok if I refill this Medication he has an appointment 01/2018

## 2017-10-10 ENCOUNTER — Other Ambulatory Visit: Payer: Self-pay | Admitting: Family Medicine

## 2017-10-13 ENCOUNTER — Telehealth: Payer: Self-pay | Admitting: Family Medicine

## 2017-10-13 ENCOUNTER — Other Ambulatory Visit: Payer: Self-pay | Admitting: Family Medicine

## 2017-10-13 ENCOUNTER — Other Ambulatory Visit: Payer: Self-pay

## 2017-10-13 MED ORDER — DULOXETINE HCL 60 MG PO CPEP: 60.0000 mg | ORAL_CAPSULE | Freq: Two times a day (BID) | ORAL | 3 refills | Status: AC

## 2017-10-13 NOTE — Progress Notes (Signed)
[facility] was called to assess if RX is at pharmacy. Pharmacy said it was denied 10/11/17. Pt had an OV dated 09/14/17 and was had been ordered 09/11/17 180 capsules with 3 refills Med refilled.

## 2017-10-13 NOTE — Telephone Encounter (Signed)
Pt refill request was refused. Pt had OV 09/14/17, called [facility] and said RX had been refused on 10/11/17. No documentation noted in chart related to discontinuation. RX for this med was previously sent 09/11/17.  Med refilled and pt notified.

## 2017-10-13 NOTE — Telephone Encounter (Signed)
Copied from [place] [id number]. Topic: Quick Communication - See Telephone Encounter >> Oct 13, 2017  4:06 PM Burnis Medin, NT wrote: CRM for notification. See Telephone encounter for: Pt wife called about wanting a prescription refill DULoxetine (CYMBALTA) 60 MG capsule. Pt said they submitted the medication for refill and it was denied. [facility] [address]. Pt. Would like a call back when medication is ready.  10/13/17.

## 2017-10-18 ENCOUNTER — Encounter: Payer: Self-pay | Admitting: Family Medicine

## 2017-10-23 NOTE — Progress Notes (Signed)
Office Visit Note  Patient: David Weaver             Date of Birth: 1950-12-09           MRN: [id number]             PCP: Wardell Honour, MD Referring: Wardell Honour, MD Visit Date: 11/05/2017 Occupation: @GUAROCC @    Subjective:  Hand pain and stiffness    History of Present Illness: David Weaver is a 66 y.o. male with history of autoimmune disease, fibromyalgia, and osteoarthritis.  Patient states after his last visit in August thousand 18 he had a reaction to methotrexate including memory loss and nausea.  He took 6 tablets and within a few days was having memory loss.  He discontinued the methotrexate and folic acid.  He continues to take Plaquenil 200 mg twice daily.  He states his bilateral knee replacements are doing well.  Eyes any swelling or pain at this time.  States he continues to have pain in his bilateral hands occasionally.  He denies any swelling of his joints.  He continues to have chronic lower back pain.  Denies any SI joint pain.  He uses Voltaren gel on his lower back hands and trapezius muscles.  He also performs exercises to stretch his neck muscles.  He states his fibromyalgia has been causing more generalized pain with the weather changes.  He denies any recent insomnia or fatigue.  He plans on starting to try to lose weight starting in January 2019.   Activities of Daily Living:  Patient reports morning stiffness for 1 hour.   Patient Denies nocturnal pain.  Difficulty dressing/grooming: Denies Difficulty climbing stairs: Denies Difficulty getting out of chair: Denies Difficulty using hands for taps, buttons, cutlery, and/or writing: Denies   Review of Systems  Constitutional: Negative for fatigue, night sweats and weakness.  HENT: Positive for hearing loss. Negative for mouth sores, mouth dryness and nose dryness.   Eyes: Positive for double vision, redness, itching and dryness.  Respiratory: Negative for cough, hemoptysis, shortness of breath and  difficulty breathing.   Cardiovascular: Negative.  Negative for chest pain, palpitations, hypertension, irregular heartbeat and swelling in legs/feet.  Gastrointestinal: Positive for diarrhea. Negative for constipation.  Endocrine: Positive for excessive thirst and increased urination.  Genitourinary: Negative for difficulty urinating and painful urination.  Musculoskeletal: Positive for arthralgias, joint pain, joint swelling, morning stiffness and muscle tenderness. Negative for myalgias, muscle weakness and myalgias.  Skin: Negative for color change, rash, hair loss, nodules/bumps, skin tightness, ulcers and sensitivity to sunlight.  Allergic/Immunologic: Negative for susceptible to infections.  Neurological: Positive for dizziness and memory loss. Negative for fainting and night sweats.  Hematological: Positive for bruising/bleeding tendency. Negative for swollen glands.  Psychiatric/Behavioral: Negative for depressed mood and sleep disturbance. The patient is not nervous/anxious.     PMFS History:  Patient Active Problem List   Diagnosis Date Noted  . High risk medication use 03/06/2017  . Paroxysmal atrial fibrillation ([place]) 02/19/2016  . Chronic pain syndrome 02/12/2015  . Left knee DJD   . Allergic rhinitis   . Osteoarthrosis, unspecified whether generalized or localized, lower leg   . Esophageal reflux   . Lupus erythematosus   . Hearing loss   . Right knee DJD   . Essential hypertension, benign 11/01/2012  . Other abnormal glucose 11/01/2012  . Depression 11/01/2012  . Insomnia 11/01/2012    Past Medical History:  Diagnosis Date  . Alcohol abuse, daily  use   . Allergic rhinitis, cause unspecified   . Dysthymic disorder   . Esophageal reflux   . Essential hypertension, benign   . Fibromyalgia    followed by [name] every six months.  . Fibromyositis   . Impotence of organic origin   . Insomnia, unspecified   . Left knee DJD   . Lupus erythematosus   .  Osteoarthrosis, unspecified whether generalized or localized, lower leg   . Other abnormal glucose   . Other specified disorder of male genital organs(608.89)   . Systemic lupus erythematosus ([place])   . Unspecified hearing loss   . Unspecified vitamin D deficiency     Family History  Problem Relation Age of Onset  . Alzheimer's disease Mother   . Osteoporosis Mother   . Heart disease Father 62       AMI  . Alcohol abuse Father   . Cancer Brother        colon cancer  . Diabetes Brother   . Kidney failure Brother    Past Surgical History:  Procedure Laterality Date  . CARPAL TUNNEL RELEASE     bilateral  . CHOLECYSTECTOMY    . COLONOSCOPY  03/01/2014   two polyps; Dr. Benson [place].  Repeat 5 years.  [name] GANGLION CYST EXCISION  1980   Left wrist  . JOINT REPLACEMENT  01/2013   right knee, left knee. [facility].  [name] KNEE ARTHROPLASTY    . KNEE ARTHROSCOPY  2008   Right  . lumbar spine epidural  2009   [facility]  . ROTATOR CUFF REPAIR  10/2008   right  . TOTAL KNEE ARTHROPLASTY  12/27/2012   Procedure: TOTAL KNEE ARTHROPLASTY;  Surgeon: [name], MD;  Location: [facility];  Service: Orthopedics;  Laterality: Right;  RIGHT ARTHROPLASTY KNEE MEDIAL AND LATERAL COMPARTMENTS WITH PATELLA RESURFACING, RIGHT TOTAL KNEE REPLACEMENT  . TOTAL KNEE ARTHROPLASTY Left 01/31/2013   Procedure: TOTAL KNEE ARTHROPLASTY;  Surgeon: [name], MD;  Location: [facility];  Service: Orthopedics;  Laterality: Left;  . TOTAL SHOULDER ARTHROPLASTY     Social History   Social History Narrative   Guns in the home stored in locked cabinet.       Caffeine use: 1 serving/ day.     Marital status:  Married x 25 years, third marriage, happily married.      Children: one son (87); one step-daughter; two grandsons      Lives: with wife. Mother-in-law going to move in in 2017.      Employment:  Works at [employer] on [employer]; [employer] work; happy; 25 hours per week.      Tobacco: none currently; quit 2013.  Smoked  x 40 years.      Alcohol:  2-6 beers per night in 2018; much heavier use in fifties.      Drugs: none      Exercise:  None in 2017.      Seatbelt: 100%      ADLs: independent with ADLs in 2017; no assistant devices      Advanced Directives: none in 2017; FULL CODE in 2017; no prolonged measures     Objective: Vital Signs: BP (!) 150/69 (BP Location: Left Arm, Patient Position: Sitting, Cuff Size: Normal)   Pulse 75   Resp 16   Ht 5\' 8"  (1.727 m)   Wt 199 lb (90.3 kg)   BMI 30.26 kg/m    Physical Exam  Constitutional: He is oriented to person, place, and  time. He appears well-developed and well-nourished.  HENT:  Head: Normocephalic and atraumatic.  Eyes: Conjunctivae and EOM are normal. Pupils are equal, round, and reactive to light.  Neck: Normal range of motion. Neck supple.  Cardiovascular: Normal rate, regular rhythm and normal heart sounds.  Pulmonary/Chest: Effort normal and breath sounds normal.  Abdominal: Soft. Bowel sounds are normal.  Neurological: He is alert and oriented to person, place, and time.  Skin: Skin is warm and dry. Capillary refill takes less than 2 seconds.  Psychiatric: He has a normal mood and affect. His behavior is normal.  Nursing note and vitals reviewed.    Musculoskeletal Exam: C-spine slightly limited range of motion.  Thoracic and lumbar spine good range of motion.  Shoulder joints elbow joints and wrist joints good range of motion.  MCPs PIPs and DIPs good range of motion.  Complete fist formation.  PIP and DIP synovial thickening consistent with osteoarthritis.  No synovitis on exam.  Hip joints knee joints and ankle joints good range of motion.  No knee effusion.  MTPs PIPs and DIPs good range of motion with no synovitis.  No midline spinal tenderness.  No SI joint tenderness.  No greater trochanteric bursitis.  Tenderness upon palpation of trapezius muscle.    CDAI Exam: No CDAI exam completed.    Investigation: No additional  findings.PLQ eye exam: 03/04/2017 CBC Latest Ref Rng & Units 07/24/2017 03/03/2017 10/30/2016  WBC 3.8 - 10.8 K/uL 4.9 4.7 5.2  Hemoglobin 13.2 - 17.1 g/dL 15.4 15.5 15.5  Hematocrit 38.5 - 50.0 % 45.3 46.0 46.1  Platelets 140 - 400 K/uL 236 265 259   CMP Latest Ref Rng & Units 09/07/2017 07/24/2017 03/03/2017  Glucose 65 - 99 mg/dL 124(H) 96 113(H)  BUN 8 - 27 mg/dL 14 15 10   Creatinine 0.76 - 1.27 mg/dL 0.71(L) 0.69(L) 0.69(L)  Sodium 134 - 144 mmol/L 139 139 141  Potassium 3.5 - 5.2 mmol/L 3.9 4.1 4.6  Chloride 96 - 106 mmol/L 100 104 101  CO2 20 - 29 mmol/L 18(L) 21 22  Calcium 8.6 - 10.2 mg/dL 8.9 9.0 9.2  Total Protein 6.0 - 8.5 g/dL 6.5 6.6 6.5  Total Bilirubin 0.0 - 1.2 mg/dL <0.2 0.8 0.3  Alkaline Phos 39 - 117 IU/L 86 112 78  AST 0 - 40 IU/L 21 67(H) 19  ALT 0 - 44 IU/L 48(H) 59(H) 28    Imaging: No results found.  Speciality Comments: No specialty comments available.    Procedures:  No procedures performed Allergies: Patient has no known allergies.   Assessment / Plan:     Visit Diagnoses: Autoimmune disease ([place]) - +ANA, +Ro, +La. history of DLE and sicca - Patient is clinically doing well.  He has had no recent flares or rashes.  He denies any joint swelling currently.  Routine autoimmune labs were drawn today.  Plan: Urinalysis, Routine w reflex microscopic, Anti-DNA antibody, double-stranded, C3 and C4, Sedimentation rate  High risk medication use - PLQ eye exam: 03/04/2017 -Patient is tolerating Plaquenil well.   He discontinued Methotrexate and folic acid due to a memory loss after starting MTX.  Routine lab monitoring was performed today. His next PLQ eye exam will be due April 2019. Plan: CBC with Differential/Platelet, COMPLETE METABOLIC PANEL WITH GFR  Fibromyalgia: He reports increased generalized pain with the weather changes. Denies insomnia or fatigue.  Discussed the importance of exercise and good sleep hygiene.    Primary osteoarthritis of both knees:  Bilateral knee replacements  are doing well.  No effusion on exam.    Primary osteoarthritis of both hands: Synovial thickening of PIP and DIPs on exam.  Continues to have intermittent discomfort.    DDD (degenerative disc disease), lumbar: Chronic discomfort.    Other medical conditions are listed as follows:   History of hypertension  History of gastroesophageal reflux (GERD)  History of depression  History of total knee replacement, bilateral: doing well    Orders: Orders Placed This Encounter  Procedures  . CBC with Differential/Platelet  . COMPLETE METABOLIC PANEL WITH GFR  . Urinalysis, Routine w reflex microscopic  . Anti-DNA antibody, double-stranded  . C3 and C4  . Sedimentation rate   No orders of the defined types were placed in this encounter.     Follow-Up Instructions: Return in about 5 months (around 04/05/2018) for Osteoarthritis, Fibromyalgia, Autoimmune Disease.   Bo Merino, MD Note - This record has been created using Editor, commissioning.  Chart creation errors have been sought, but may not always  have been located. Such creation errors do not reflect on  the standard of medical care.

## 2017-10-25 MED ORDER — LOSARTAN POTASSIUM 25 MG PO TABS: 25.0000 mg | ORAL_TABLET | Freq: Every day | ORAL | 1 refills | Status: DC

## 2017-11-05 ENCOUNTER — Ambulatory Visit (INDEPENDENT_AMBULATORY_CARE_PROVIDER_SITE_OTHER): Payer: Medicare Other | Admitting: Rheumatology

## 2017-11-05 ENCOUNTER — Encounter: Payer: Self-pay | Admitting: Rheumatology

## 2017-11-05 VITALS — BP 150/69 | HR 75 | Resp 16 | Ht 68.0 in | Wt 199.0 lb

## 2017-11-05 DIAGNOSIS — Z96653 Presence of artificial knee joint, bilateral: Secondary | ICD-10-CM | POA: Diagnosis not present

## 2017-11-05 DIAGNOSIS — Z8679 Personal history of other diseases of the circulatory system: Secondary | ICD-10-CM | POA: Diagnosis not present

## 2017-11-05 DIAGNOSIS — M170 Bilateral primary osteoarthritis of knee: Secondary | ICD-10-CM | POA: Diagnosis not present

## 2017-11-05 DIAGNOSIS — M797 Fibromyalgia: Secondary | ICD-10-CM

## 2017-11-05 DIAGNOSIS — M359 Systemic involvement of connective tissue, unspecified: Secondary | ICD-10-CM

## 2017-11-05 DIAGNOSIS — M19041 Primary osteoarthritis, right hand: Secondary | ICD-10-CM | POA: Diagnosis not present

## 2017-11-05 DIAGNOSIS — M19042 Primary osteoarthritis, left hand: Secondary | ICD-10-CM

## 2017-11-05 DIAGNOSIS — Z8719 Personal history of other diseases of the digestive system: Secondary | ICD-10-CM | POA: Diagnosis not present

## 2017-11-05 DIAGNOSIS — M5136 Other intervertebral disc degeneration, lumbar region: Secondary | ICD-10-CM | POA: Diagnosis not present

## 2017-11-05 DIAGNOSIS — M51369 Other intervertebral disc degeneration, lumbar region without mention of lumbar back pain or lower extremity pain: Secondary | ICD-10-CM

## 2017-11-05 DIAGNOSIS — Z79899 Other long term (current) drug therapy: Secondary | ICD-10-CM

## 2017-11-05 DIAGNOSIS — Z8659 Personal history of other mental and behavioral disorders: Secondary | ICD-10-CM

## 2017-11-05 DIAGNOSIS — D8989 Other specified disorders involving the immune mechanism, not elsewhere classified: Secondary | ICD-10-CM | POA: Diagnosis not present

## 2017-11-06 LAB — COMPLETE METABOLIC PANEL WITH GFR
AG RATIO: 1.7 (calc) (ref 1.0–2.5)
ALBUMIN MSPROF: 4.1 g/dL (ref 3.6–5.1)
ALKALINE PHOSPHATASE (APISO): 72 U/L (ref 40–115)
ALT: 30 U/L (ref 9–46)
AST: 18 U/L (ref 10–35)
BUN/Creatinine Ratio: 25 (calc) — ABNORMAL HIGH (ref 6–22)
BUN: 15 mg/dL (ref 7–25)
CO2: 25 mmol/L (ref 20–32)
CREATININE: 0.6 mg/dL — AB (ref 0.70–1.25)
Calcium: 9.3 mg/dL (ref 8.6–10.3)
Chloride: 103 mmol/L (ref 98–110)
GFR, Est African American: 120 mL/min/{1.73_m2} (ref >=60)
GFR, Est Non African American: 103 mL/min/{1.73_m2} (ref >=60)
Globulin: 2.4 g/dL (calc) (ref 1.9–3.7)
Glucose, Bld: 77 mg/dL (ref 65–99)
POTASSIUM: 4.1 mmol/L (ref 3.5–5.3)
SODIUM: 138 mmol/L (ref 135–146)
TOTAL PROTEIN: 6.5 g/dL (ref 6.1–8.1)
Total Bilirubin: 0.5 mg/dL (ref 0.2–1.2)

## 2017-11-06 LAB — CBC WITH DIFFERENTIAL/PLATELET
BASOS ABS: 52 {cells}/uL (ref 0–200)
BASOS PCT: 0.9 %
EOS ABS: 261 {cells}/uL (ref 15–500)
Eosinophils Relative: 4.5 %
HCT: 44.8 % (ref 38.5–50.0)
HEMOGLOBIN: 15.5 g/dL (ref 13.2–17.1)
Lymphs Abs: 1177 cells/uL (ref 850–3900)
MCH: 29.6 pg (ref 27.0–33.0)
MCHC: 34.6 g/dL (ref 32.0–36.0)
MCV: 85.5 fL (ref 80.0–100.0)
MONOS PCT: 10.1 %
MPV: 10 fL (ref 7.5–12.5)
NEUTROS ABS: 3724 {cells}/uL (ref 1500–7800)
Neutrophils Relative %: 64.2 %
Platelets: 294 10*3/uL (ref 140–400)
RBC: 5.24 10*6/uL (ref 4.20–5.80)
RDW: 13.3 % (ref 11.0–15.0)
TOTAL LYMPHOCYTE: 20.3 %
WBC: 5.8 10*3/uL (ref 3.8–10.8)
WBCMIX: 586 {cells}/uL (ref 200–950)

## 2017-11-06 LAB — URINALYSIS, ROUTINE W REFLEX MICROSCOPIC
Bilirubin Urine: NEGATIVE
Glucose, UA: NEGATIVE
HGB URINE DIPSTICK: NEGATIVE
Ketones, ur: NEGATIVE
LEUKOCYTES UA: NEGATIVE
NITRITE: NEGATIVE
PH: 7 (ref 5.0–8.0)
Protein, ur: NEGATIVE
SPECIFIC GRAVITY, URINE: 1.018 (ref 1.001–1.03)

## 2017-11-06 LAB — SEDIMENTATION RATE: Sed Rate: 6 mm/h (ref 0–20)

## 2017-11-06 LAB — C3 AND C4
C3 Complement: 125 mg/dL (ref 82–185)
C4 COMPLEMENT: 20 mg/dL (ref 15–53)

## 2017-11-06 LAB — ANTI-DNA ANTIBODY, DOUBLE-STRANDED: ds DNA Ab: 6 IU/mL — ABNORMAL HIGH

## 2017-11-06 NOTE — Progress Notes (Signed)
Autoimmune labs are stable

## 2017-11-06 NOTE — Progress Notes (Signed)
Creatinine stable.  All other labs WNL.

## 2017-11-11 ENCOUNTER — Other Ambulatory Visit: Payer: Self-pay | Admitting: Family Medicine

## 2017-11-12 DIAGNOSIS — M47816 Spondylosis without myelopathy or radiculopathy, lumbar region: Secondary | ICD-10-CM | POA: Diagnosis not present

## 2017-11-12 DIAGNOSIS — M47812 Spondylosis without myelopathy or radiculopathy, cervical region: Secondary | ICD-10-CM | POA: Diagnosis not present

## 2017-11-12 DIAGNOSIS — M150 Primary generalized (osteo)arthritis: Secondary | ICD-10-CM | POA: Diagnosis not present

## 2017-11-12 DIAGNOSIS — Z79891 Long term (current) use of opiate analgesic: Secondary | ICD-10-CM | POA: Diagnosis not present

## 2018-01-04 DIAGNOSIS — M47816 Spondylosis without myelopathy or radiculopathy, lumbar region: Secondary | ICD-10-CM | POA: Diagnosis not present

## 2018-01-04 DIAGNOSIS — M5432 Sciatica, left side: Secondary | ICD-10-CM | POA: Diagnosis not present

## 2018-01-04 DIAGNOSIS — M150 Primary generalized (osteo)arthritis: Secondary | ICD-10-CM | POA: Diagnosis not present

## 2018-01-04 DIAGNOSIS — M47812 Spondylosis without myelopathy or radiculopathy, cervical region: Secondary | ICD-10-CM | POA: Diagnosis not present

## 2018-01-13 NOTE — Progress Notes (Signed)
Subjective:    Patient ID: David Weaver, male    DOB: 13-Jan-1951, 67 y.o.   MRN: [id number]  01/18/2018  Hypertension; Depression; Hyperlipidemia; and Gastroesophageal Reflux    HPI This 67 y.o. male presents for six month follow-up of hypertension, glucose intolerance, anxiety/depression, GERD.  Management changes made at previous visit: -persistent dizziness; recommend patient checking BP daily; diastolic BP borderline low; non-compliant with carotid dopplers and declined today; declined referral for MRI brain. -persistent diarrhea; associated with excessive alcohol intake; recommend decreasing alcohol intake. -decrease energy and motivation; recommend daily exercise; obtain labs. -continue current medications. -followed by pain management for chronic pain syndrome due to DDD cervical and lumbar spines. -followed by cardiology for paroxysmal atrial fibrillation. -persistent abdominal wall sprain; pain only with movements going form supine to sitting; pt declined CT abdomen to rule out other concerning pathology; pt declined referral to physical therapy. -followed closely by rheumatology due to fibromyalgia and discoid lupus; recently intolerant to MTX.  Update: Abdominal pain lower: stomach is killing patient; chronic diarrhea.  Now having stomach pain with diarrhea.  Keep gas all the time; bloated.  Onset of lower abdominal pain one month.  No fever/chill/sweats.  Bloody stools yet has hemorrhoids; bright red blood with bowel movements.  Has abdominal pain every morning; goes 3-4 times in morning.  Switched to decaf coffee with persistence; stopped coffee without improvement.  Drinking Ensure without improvement.  Was having nausea; none now.  No vomiting.  No heartburn or indigestion.  Pain only in the morning.  If eats something at lunch that does not agree, will start hurting again.   S/p repeat colonoscopy in 2015 by [facility]; 3-4 polyps descending colon and transverse colon.   Diverticulosis in descending.  No recent travel.  No sick contacts.  No recent abx.  No nighttime awakening.  Quit drinking also.  If goes out to eat, might have a beer. Waste of money.  No malaise. Some L sided chest pain; non-exertional; burning in L chest.  Occurs once per day on average.  No recent follow-up with cardiology.  Not sure when follow-up is recommended.   BP Readings from Last 3 Encounters:  01/18/18 128/72  11/05/17 (!) 150/69  09/14/17 128/62   Wt Readings from Last 3 Encounters:  01/18/18 191 lb (86.6 kg)  11/05/17 199 lb (90.3 kg)  09/14/17 189 lb (85.7 kg)   Immunization History  Administered Date(s) Administered  . Influenza, Seasonal, Injecte, Preservative Fre 11/01/2012  . Influenza,inj,Quad PF,6+ Mos 11/21/2013, 10/11/2014, 10/03/2015, 09/02/2016, 09/07/2017  . Influenza-Unspecified 08/20/2009, 08/19/2010, 08/12/2011  . Pneumococcal Conjugate-13 02/12/2015  . Pneumococcal Polysaccharide-23 02/19/2016  . Pneumococcal-Unspecified 03/03/2008  . Td 07/01/2001  . Tdap 02/03/2012  . Zoster 08/12/2011    Review of Systems  Constitutional: Negative for activity change, appetite change, chills, diaphoresis, fatigue and fever.  Respiratory: Negative for cough and shortness of breath.   Cardiovascular: Positive for chest pain. Negative for palpitations and leg swelling.  Gastrointestinal: Positive for diarrhea. Negative for abdominal distention, abdominal pain, anal bleeding, blood in stool, constipation, nausea, rectal pain and vomiting.  Endocrine: Negative for cold intolerance, heat intolerance, polydipsia, polyphagia and polyuria.  Musculoskeletal: Positive for arthralgias and back pain.  Skin: Negative for color change, rash and wound.  Neurological: Negative for dizziness, tremors, seizures, syncope, facial asymmetry, speech difficulty, weakness, light-headedness, numbness and headaches.  Psychiatric/Behavioral: Negative for dysphoric mood and sleep  disturbance. The patient is not nervous/anxious.     Past Medical History:  Diagnosis Date  .  Alcohol abuse, daily use   . Allergic rhinitis, cause unspecified   . Dysthymic disorder   . Esophageal reflux   . Essential hypertension, benign   . Fibromyalgia    followed by [name] every six months.  . Fibromyositis   . Impotence of organic origin   . Insomnia, unspecified   . Left knee DJD   . Lupus erythematosus   . Osteoarthrosis, unspecified whether generalized or localized, lower leg   . Other abnormal glucose   . Other specified disorder of male genital organs(608.89)   . Systemic lupus erythematosus ([place])   . Unspecified hearing loss   . Unspecified vitamin D deficiency    Past Surgical History:  Procedure Laterality Date  . CARPAL TUNNEL RELEASE     bilateral  . CHOLECYSTECTOMY    . COLONOSCOPY  03/01/2014   two polyps; Dr. Benson [place].  Repeat 5 years.  [name] GANGLION CYST EXCISION  1980   Left wrist  . JOINT REPLACEMENT  01/2013   right knee, left knee. [facility].  [name] KNEE ARTHROPLASTY    . KNEE ARTHROSCOPY  2008   Right  . lumbar spine epidural  2009   [facility]  . ROTATOR CUFF REPAIR  10/2008   right  . TOTAL KNEE ARTHROPLASTY  12/27/2012   Procedure: TOTAL KNEE ARTHROPLASTY;  Surgeon: [name], MD;  Location: [facility];  Service: Orthopedics;  Laterality: Right;  RIGHT ARTHROPLASTY KNEE MEDIAL AND LATERAL COMPARTMENTS WITH PATELLA RESURFACING, RIGHT TOTAL KNEE REPLACEMENT  . TOTAL KNEE ARTHROPLASTY Left 01/31/2013   Procedure: TOTAL KNEE ARTHROPLASTY;  Surgeon: [name], MD;  Location: [facility];  Service: Orthopedics;  Laterality: Left;  . TOTAL SHOULDER ARTHROPLASTY     No Known Allergies Current Outpatient Medications on File Prior to Visit  Medication Sig Dispense Refill  . amLODipine (NORVASC) 10 MG tablet TAKE 1 TABLET BY MOUTH EVERY DAY 90 tablet 3  . Cyanocobalamin (VITAMIN B12 PO) Take by mouth daily.    . diclofenac sodium (VOLTAREN) 1 % GEL  APPLY 2 TO 4 GRAMS TO AFFECTED AREA 4 TIMES DAILY 100 g 4  . DULoxetine (CYMBALTA) 60 MG capsule Take 1 capsule (60 mg total) 2 (two) times daily by mouth. 180 capsule 3  . hydroxychloroquine (PLAQUENIL) 200 MG tablet TAKE 1 TABLET BY MOUTH TWICE A DAY 180 tablet 1  . oxyCODONE-acetaminophen (PERCOCET/ROXICET) 5-325 MG tablet Take 1 tablet by mouth every 4 (four) hours as needed. for pain  0  . rivaroxaban (XARELTO) 20 MG TABS tablet Take 1 tablet (20 mg total) by mouth daily with supper. 30 tablet 5  . traZODone (DESYREL) 50 MG tablet TAKE 1 TO 2 TABLETS BY MOUTH AT BEDTIME AS NEEDED FOR SLEEP 180 tablet 3  . Vitamin D, Ergocalciferol, (DRISDOL) 50000 units CAPS capsule TAKE ONE CAPSULE BY MOUTH EVERY 7 DAYS 4 capsule 4   No current facility-administered medications on file prior to visit.    Social History   Socioeconomic History  . Marital status: Married    Spouse name: Not on file  . Number of children: 1  . Years of education: Not on file  . Highest education level: Not on file  Social Needs  . Financial resource strain: Not on file  . Food insecurity - worry: Not on file  . Food insecurity - inability: Not on file  . Transportation needs - medical: Not on file  . Transportation needs - non-medical: Not on file  Occupational History  . Occupation:  Body [employer]    Employer: [employer]    Comment: Paints cars/trucks  Tobacco Use  . Smoking status: Former Smoker    Packs/day: 2.00    Years: 30.00    Pack years: 60.00    Types: Cigarettes    Last attempt to quit: 12/28/2008    Years since quitting: 9.0  . Smokeless tobacco: Never Used  . Tobacco comment: quit 12 /2011  Substance and Sexual Activity  . Alcohol use: Yes    Alcohol/week: 6.0 oz    Types: 6 Cans of beer, 4 Standard drinks or equivalent per week  . Drug use: No  . Sexual activity: Yes    Birth control/protection: Post-menopausal  Other Topics Concern  . Not on file  Social History Narrative   Guns in the  home stored in locked cabinet.       Caffeine use: 1 serving/ day.     Marital status:  Married x 25 years, third marriage, happily married.      Children: one son (35); one step-daughter; two grandsons      Lives: with wife. Mother-in-law going to move in in 2017.      Employment:  Works at [employer] on [employer]; [employer] work; happy; 25 hours per week.      Tobacco: none currently; quit 2013.  Smoked x 40 years.      Alcohol:  2-6 beers per night in 2018; much heavier use in fifties.      Drugs: none      Exercise:  None in 2017.      Seatbelt: 100%      ADLs: independent with ADLs in 2017; no assistant devices      Advanced Directives: none in 2017; FULL CODE in 2017; no prolonged measures   Family History  Problem Relation Age of Onset  . Alzheimer's disease Mother   . Osteoporosis Mother   . Heart disease Father 76       AMI  . Alcohol abuse Father   . Cancer Brother        colon cancer  . Diabetes Brother   . Kidney failure Brother        Objective:    BP 128/72   Pulse 96   Temp 98 F (36.7 C) (Oral)   Resp 16   Ht 5' 7.72" (1.72 m)   Wt 191 lb (86.6 kg)   SpO2 96%   BMI 29.28 kg/m  Physical Exam  Constitutional: He is oriented to person, place, and time. He appears well-developed and well-nourished. No distress.  HENT:  Head: Normocephalic and atraumatic.  Right Ear: External ear normal.  Left Ear: External ear normal.  Nose: Nose normal.  Mouth/Throat: Oropharynx is clear and moist.  Eyes: Conjunctivae and EOM are normal. Pupils are equal, round, and reactive to light.  Neck: Normal range of motion. Neck supple. Carotid bruit is not present. No thyromegaly present.  Cardiovascular: Normal rate, regular rhythm, normal heart sounds and intact distal pulses. Exam reveals no gallop and no friction rub.  No murmur heard. Pulmonary/Chest: Effort normal and breath sounds normal. He has no wheezes. He has no rales.  Abdominal: Soft. Bowel sounds are normal. He  exhibits no distension and no mass. There is no tenderness. There is no rebound and no guarding.  Lymphadenopathy:    He has no cervical adenopathy.  Neurological: He is alert and oriented to person, place, and time. No cranial nerve deficit. He exhibits normal muscle tone. Coordination normal.  Skin: Skin is warm and dry. No rash noted. He is not diaphoretic.  Psychiatric: He has a normal mood and affect. His behavior is normal. Judgment and thought content normal.  Nursing note and vitals reviewed.  No results found. Depression screen [facility] 2/9 01/18/2018 09/14/2017 09/07/2017 05/15/2017 03/03/2017  Decreased Interest 0 0 0 0 0  Down, Depressed, Hopeless 0 0 0 0 0  PHQ - 2 Score 0 0 0 0 0  Altered sleeping - - 0 - -  Tired, decreased energy - - 3 - -  Change in appetite - - 0 - -  Feeling bad or failure about yourself  - - 0 - -  Trouble concentrating - - 2 - -  Moving slowly or fidgety/restless - - 0 - -  Suicidal thoughts - - 0 - -  PHQ-9 Score - - 5 - -  Difficult doing work/chores - - Not difficult at all - -   Fall Risk  01/18/2018 09/14/2017 09/07/2017 05/15/2017 03/03/2017  Falls in the past year? No No No No No  Number falls in past yr: - - - - -  Injury with [place]:   1. Essential hypertension, benign   2. Paroxysmal atrial fibrillation ([name])   3. Recurrent major depressive disorder, in partial remission ([place])   4. Psychophysiological insomnia   5. Discoid lupus erythematosus   6. Chronic pain syndrome   7. Hyperglycemia   8. Hypercholesteremia   9. Functional diarrhea   10. Other chest pain   11. Generalized abdominal pain     Acutely worsening diarrhea associated with lower abdominal pain.  Obtain stool cultures to rule out infectious pathology.  Obtain CT abdomen pelvis to evaluate abdominal pain further.  Refer to GI.  Colonoscopy up-to-date.  Avoid alcohol intake which appears to exacerbate diarrhea.  Benign  abdominal exam in office.  Atypical chest pain: New onset.  Obtained EKG and WNL.  Refer to cardiology.  May be secondary to GERD with recent acute worsening diarrhea with lower abdominal pain.  Continue Prilosec 20mg  daily; consider addition of Zantac 150mg  qhs.  Atrial fibrillation: stable with Xarelto and Metoprolol therapies.  Due for follow-up with cardiology.  Hypertension: well controlled with Amlodipine and addition of Losartan 25mg  daily.  Obtain labs.   Chronic pain syndrome secondary to DDD lumbar spine: Stable.  Maintained on oxycodone therapy per pain management.  Anxiety and depression: Moderately controlled with Cymbalta therapy daily.  No changes at this time.  Glucose intolerance: stable; obtain labs.  Weight loss, exercise, low-carbohydrate food choices.  Orders Placed This Encounter  Procedures  . Clostridium Difficile by PCR    Order Specific Question:   Is your patient experiencing loose or watery stools (3 or more in 24 hours)?    Answer:   Yes    Order Specific Question:   Has the patient received laxatives in the last 24 hours?    Answer:   No    Order Specific Question:   Has a negative Cdiff test resulted in the last 7 days?    Answer:   No  . Stool culture  . Ova and parasite examination  . Stool culture  . Ova and parasite examination  . CT Abdomen Pelvis W Contrast    Standing Status:   Future    Standing Expiration Date:   04/18/2019    Order  Specific Question:   If indicated for the ordered procedure, I authorize the administration of contrast media per Radiology protocol    Answer:   Yes    Order Specific Question:   Preferred imaging location?    Answer:   [facility]    Order Specific Question:   Radiology Contrast Protocol - do NOT remove file path    Answer:   [email or web address]  . Comprehensive metabolic panel    Order Specific Question:   Has the patient fasted?    Answer:   No  . Hemoglobin A1c  . Lipid  panel    Order Specific Question:   Has the patient fasted?    Answer:   No  . Urinalysis, dipstick only  . Ambulatory referral to Cardiology    Referral Priority:   Routine    Referral Type:   Consultation    Referral Reason:   Specialty Services Required    Referred to Provider:   Skeet Latch, MD    Requested Specialty:   Cardiology    Number of Visits Requested:   1  . Ambulatory referral to Gastroenterology    Referral Priority:   Routine    Referral Type:   Consultation    Referral Reason:   Specialty Services Required    Number of Visits Requested:   1  . EKG 12-Lead   Meds ordered this encounter  Medications  . losartan (COZAAR) 25 MG tablet    Sig: Take 1 tablet (25 mg total) by mouth daily.    Dispense:  90 tablet    Refill:  3  . metoprolol succinate (TOPROL-XL) 50 MG 24 hr tablet    Sig: Take 1 tablet (50 mg total) by mouth daily. Take with or immediately following a meal.    Dispense:  90 tablet    Refill:  3  . omeprazole (PRILOSEC) 20 MG capsule    Sig: TAKE 1 CAPSULE BY MOUTH EVERY DAY    Dispense:  90 capsule    Refill:  3    Return in about 3 months (around 04/17/2018) for follow-up chronic medical conditions.   Kajuana Shareef Elayne Guerin, M.D. Primary Care at [facility] previously [facility] [address] [place], [place]  [address] [phone] phone [phone] fax

## 2018-01-18 ENCOUNTER — Other Ambulatory Visit: Payer: Self-pay

## 2018-01-18 ENCOUNTER — Encounter: Payer: Self-pay | Admitting: Family Medicine

## 2018-01-18 ENCOUNTER — Ambulatory Visit (INDEPENDENT_AMBULATORY_CARE_PROVIDER_SITE_OTHER): Payer: Medicare Other | Admitting: Family Medicine

## 2018-01-18 VITALS — BP 128/72 | HR 96 | Temp 98.0°F | Resp 16 | Ht 67.72 in | Wt 191.0 lb

## 2018-01-18 DIAGNOSIS — R1084 Generalized abdominal pain: Secondary | ICD-10-CM | POA: Diagnosis not present

## 2018-01-18 DIAGNOSIS — G894 Chronic pain syndrome: Secondary | ICD-10-CM

## 2018-01-18 DIAGNOSIS — F5104 Psychophysiologic insomnia: Secondary | ICD-10-CM

## 2018-01-18 DIAGNOSIS — E7800 Pure hypercholesterolemia, unspecified: Secondary | ICD-10-CM

## 2018-01-18 DIAGNOSIS — I10 Essential (primary) hypertension: Secondary | ICD-10-CM

## 2018-01-18 DIAGNOSIS — F3341 Major depressive disorder, recurrent, in partial remission: Secondary | ICD-10-CM | POA: Diagnosis not present

## 2018-01-18 DIAGNOSIS — I480 Paroxysmal atrial fibrillation: Secondary | ICD-10-CM | POA: Diagnosis not present

## 2018-01-18 DIAGNOSIS — L930 Discoid lupus erythematosus: Secondary | ICD-10-CM | POA: Diagnosis not present

## 2018-01-18 DIAGNOSIS — R739 Hyperglycemia, unspecified: Secondary | ICD-10-CM

## 2018-01-18 DIAGNOSIS — K591 Functional diarrhea: Secondary | ICD-10-CM | POA: Diagnosis not present

## 2018-01-18 DIAGNOSIS — R0789 Other chest pain: Secondary | ICD-10-CM

## 2018-01-18 LAB — HEMOGLOBIN A1C
Est. average glucose Bld gHb Est-mCnc: 128 mg/dL
Hgb A1c MFr Bld: 6.1 % — ABNORMAL HIGH (ref 4.8–5.6)

## 2018-01-18 LAB — COMPREHENSIVE METABOLIC PANEL
A/G RATIO: 2 (ref 1.2–2.2)
ALT: 32 IU/L (ref 0–44)
AST: 18 IU/L (ref 0–40)
Albumin: 4.3 g/dL (ref 3.6–4.8)
Alkaline Phosphatase: 73 IU/L (ref 39–117)
BUN/Creatinine Ratio: 15 (ref 10–24)
BUN: 10 mg/dL (ref 8–27)
Bilirubin Total: 0.4 mg/dL (ref 0.0–1.2)
CALCIUM: 9.1 mg/dL (ref 8.6–10.2)
CO2: 23 mmol/L (ref 20–29)
Chloride: 104 mmol/L (ref 96–106)
Creatinine, Ser: 0.67 mg/dL — ABNORMAL LOW (ref 0.76–1.27)
GFR, EST AFRICAN AMERICAN: 113 mL/min/{1.73_m2} (ref >59)
GFR, EST NON AFRICAN AMERICAN: 98 mL/min/{1.73_m2} (ref >59)
GLOBULIN, TOTAL: 2.2 g/dL (ref 1.5–4.5)
Glucose: 95 mg/dL (ref 65–99)
POTASSIUM: 4.3 mmol/L (ref 3.5–5.2)
Sodium: 141 mmol/L (ref 134–144)
TOTAL PROTEIN: 6.5 g/dL (ref 6.0–8.5)

## 2018-01-18 LAB — LIPID PANEL
Chol/HDL Ratio: 5.2 ratio — ABNORMAL HIGH (ref 0.0–5.0)
Cholesterol, Total: 177 mg/dL (ref 100–199)
HDL: 34 mg/dL — ABNORMAL LOW (ref >39)
LDL CALC: 92 mg/dL (ref 0–99)
Triglycerides: 255 mg/dL — ABNORMAL HIGH (ref 0–149)
VLDL CHOLESTEROL CAL: 51 mg/dL — AB (ref 5–40)

## 2018-01-18 NOTE — Patient Instructions (Addendum)
   IF you received an x-ray today, you will receive an invoice from [facility]. Please contact [facility] at [phone] with questions or concerns regarding your invoice.   IF you received labwork today, you will receive an invoice from [facility]. Please contact [facility] at [phone] with questions or concerns regarding your invoice.   Our billing staff will not be able to assist you with questions regarding bills from these companies.  You will be contacted with the lab results as soon as they are available. The fastest way to get your results is to activate your My Chart account. Instructions are located on the last page of this paperwork. If you have not heard from us regarding the results in 2 weeks, please contact this office.     Diarrhea, Adult Diarrhea is frequent loose and watery bowel movements. Diarrhea can make you feel weak and cause you to become dehydrated. Dehydration can make you tired and thirsty, cause you to have a dry mouth, and decrease how often you urinate. Diarrhea typically lasts 2-3 days. However, it can last longer if it is a sign of something more serious. It is important to treat your diarrhea as told by your health care provider. Follow these instructions at home: Eating and drinking  Follow these recommendations as told by your health care provider:  Take an oral rehydration solution (ORS). This is a drink that is sold at pharmacies and retail stores.  Drink clear fluids, such as water, ice chips, diluted fruit juice, and low-calorie sports drinks.  Eat bland, easy-to-digest foods in small amounts as you are able. These foods include bananas, applesauce, rice, lean meats, toast, and crackers.  Avoid drinking fluids that contain a lot of sugar or caffeine, such as energy drinks, sports drinks, and soda.  Avoid alcohol.  Avoid spicy or fatty foods.  General instructions  Drink enough fluid to keep your urine clear or pale  yellow.  Wash your hands often. If soap and water are not available, use hand sanitizer.  Make sure that all people in your household wash their hands well and often.  Take over-the-counter and prescription medicines only as told by your health care provider.  Rest at home while you recover.  Watch your condition for any changes.  Take a warm bath to relieve any burning or pain from frequent diarrhea episodes.  Keep all follow-up visits as told by your health care provider. This is important. Contact a health care provider if:  You have a fever.  Your diarrhea gets worse.  You have new symptoms.  You cannot keep fluids down.  You feel light-headed or dizzy.  You have a headache  You have muscle cramps. Get help right away if:  You have chest pain.  You feel extremely weak or you faint.  You have bloody or black stools or stools that look like tar.  You have severe pain, cramping, or bloating in your abdomen.  You have trouble breathing or you are breathing very quickly.  Your heart is beating very quickly.  Your skin feels cold and clammy.  You feel confused.  You have signs of dehydration, such as: ? Dark urine, very little urine, or no urine. ? Cracked lips. ? Dry mouth. ? Sunken eyes. ? Sleepiness. ? Weakness. This information is not intended to replace advice given to you by your health care provider. Make sure you discuss any questions you have with your health care provider. Document Released: 11/07/2002 Document Revised: 03/27/2016 Document   Reviewed: 07/24/2015 Elsevier Interactive Patient Education  2018 [employer]vier Inc.  

## 2018-01-19 LAB — URINALYSIS, DIPSTICK ONLY
BILIRUBIN UA: NEGATIVE
Ketones, UA: NEGATIVE
Leukocytes, UA: NEGATIVE
Nitrite, UA: NEGATIVE
PH UA: 7 (ref 5.0–7.5)
PROTEIN UA: NEGATIVE
RBC, UA: NEGATIVE
Specific Gravity, UA: 1.02 (ref 1.005–1.030)
UUROB: 0.2 mg/dL (ref 0.2–1.0)

## 2018-01-21 DIAGNOSIS — K591 Functional diarrhea: Secondary | ICD-10-CM | POA: Diagnosis not present

## 2018-01-24 ENCOUNTER — Telehealth: Payer: Self-pay | Admitting: Family Medicine

## 2018-01-24 MED ORDER — OSELTAMIVIR PHOSPHATE 75 MG PO CAPS: 75.0000 mg | ORAL_CAPSULE | Freq: Every day | ORAL | 0 refills | Status: DC

## 2018-01-24 NOTE — Telephone Encounter (Signed)
Wife called stating that grandson, Cayleb, diagnosed with influenza A; requesting Tamiflu sent in for patient.

## 2018-01-26 LAB — STOOL CULTURE: E coli, Shiga toxin Assay: NEGATIVE

## 2018-01-26 LAB — OVA AND PARASITE EXAMINATION

## 2018-01-26 LAB — CLOSTRIDIUM DIFFICILE BY PCR: CDIFFPCR: NEGATIVE

## 2018-01-26 MED ORDER — METOPROLOL SUCCINATE ER 50 MG PO TB24: 50.0000 mg | ORAL_TABLET | Freq: Every day | ORAL | 3 refills | Status: DC

## 2018-01-26 MED ORDER — LOSARTAN POTASSIUM 25 MG PO TABS: 25.0000 mg | ORAL_TABLET | Freq: Every day | ORAL | 3 refills | Status: DC

## 2018-01-26 MED ORDER — OMEPRAZOLE 20 MG PO CPDR: DELAYED_RELEASE_CAPSULE | ORAL | 3 refills | Status: DC

## 2018-01-28 ENCOUNTER — Ambulatory Visit (INDEPENDENT_AMBULATORY_CARE_PROVIDER_SITE_OTHER): Payer: Medicare Other | Admitting: Cardiovascular Disease

## 2018-01-28 ENCOUNTER — Encounter: Payer: Self-pay | Admitting: Cardiovascular Disease

## 2018-01-28 VITALS — BP 138/82 | HR 65 | Ht 67.0 in | Wt 191.2 lb

## 2018-01-28 DIAGNOSIS — I2510 Atherosclerotic heart disease of native coronary artery without angina pectoris: Secondary | ICD-10-CM

## 2018-01-28 DIAGNOSIS — R42 Dizziness and giddiness: Secondary | ICD-10-CM | POA: Diagnosis not present

## 2018-01-28 DIAGNOSIS — R079 Chest pain, unspecified: Secondary | ICD-10-CM

## 2018-01-28 DIAGNOSIS — I480 Paroxysmal atrial fibrillation: Secondary | ICD-10-CM

## 2018-01-28 DIAGNOSIS — I10 Essential (primary) hypertension: Secondary | ICD-10-CM | POA: Diagnosis not present

## 2018-01-28 NOTE — Patient Instructions (Addendum)
Medication Instructions:  Your physician recommends that you continue on your current medications as directed. Please refer to the Current Medication list given to you today.  Labwork: NONE  Testing/Procedures: Your physician has requested that you have en exercise stress myoview. For further information please visit [email or web address]. Please follow instruction sheet, as given. DO NOT TAKE YOUR METOPROLOL MORNING OF TEST   Your physician has requested that you have a carotid duplex. This test is an ultrasound of the carotid arteries in your neck. It looks at blood flow through these arteries that supply the brain with blood. Allow one hour for this exam. There are no restrictions or special instructions.  Follow-Up: Your physician recommends that you schedule a follow-up appointment in: IN MAY     Any Other Special Instructions Will Be Listed Below (If Applicable).

## 2018-01-28 NOTE — Progress Notes (Signed)
Cardiology Office Note   Date:  01/28/2018   ID:  Rayfield, Beem Mar 19, 1951, MRN [id number]  PCP:  Wardell Honour, MD  Cardiologist:   Skeet Latch, MD   No chief complaint on file.     History of Present Illness: David Weaver is a 67 y.o. male with paroxysmal atrial fibrillation, hypertension and lupus who presents for follow up.  David Weaver was seen in clinic on 08/07/15 for a presurgical assessment.  At that time he was cleared for shoulder surgery.  During surgery David Weaver went into atrial fibrillation with RVR.  He reportedly went back into sinus rhythm without intervention. He was started on Eliquis 5mg  daily and referred to follow up with cardiology.  He subsequently had a 30 day event monitor to determine whether he needed to continue on anticoagulation. The monitor did reveal recurrent atrial fibrillation so anticoagulation was continued.    David Weaver presents to clinic today because of episodes of left-sided chest discomfort.  For the last several months he has noted a funny feeling in his left chest with exertion.  He last had it this week when carrying the trash to the garbage.  He felt short of breath and feels like there is a machine running in his chest.  It does not feel like pain but is uncomfortable.  There was associated nausea but no diaphoresis.  He has not noted any lower extremity edema, orthopnea, or PND.  He does also note intermittent right arm pain.  However these episodes are separate from his chest discomfort.  Additionally he reports some episodes of dizziness.  These are not particularly exertional and do not change with position.    Past Medical History:  Diagnosis Date  . Alcohol abuse, daily use   . Allergic rhinitis, cause unspecified   . Dysthymic disorder   . Esophageal reflux   . Essential hypertension, benign   . Fibromyalgia    followed by [name] every six months.  . Fibromyositis   . Impotence of organic origin   . Insomnia,  unspecified   . Left knee DJD   . Lupus erythematosus   . Osteoarthrosis, unspecified whether generalized or localized, lower leg   . Other abnormal glucose   . Other specified disorder of male genital organs(608.89)   . Systemic lupus erythematosus ([place])   . Unspecified hearing loss   . Unspecified vitamin D deficiency     Past Surgical History:  Procedure Laterality Date  . CARPAL TUNNEL RELEASE     bilateral  . CHOLECYSTECTOMY    . COLONOSCOPY  03/01/2014   two polyps; Dr. Benson Norway.  Repeat 5 years.  [name] GANGLION CYST EXCISION  1980   Left wrist  . JOINT REPLACEMENT  01/2013   right knee, left knee. Wainer.  [name] KNEE ARTHROPLASTY    . KNEE ARTHROSCOPY  2008   Right  . lumbar spine epidural  2009   [facility]  . ROTATOR CUFF REPAIR  10/2008   right  . TOTAL KNEE ARTHROPLASTY  12/27/2012   Procedure: TOTAL KNEE ARTHROPLASTY;  Surgeon: [name], MD;  Location: [facility];  Service: Orthopedics;  Laterality: Right;  RIGHT ARTHROPLASTY KNEE MEDIAL AND LATERAL COMPARTMENTS WITH PATELLA RESURFACING, RIGHT TOTAL KNEE REPLACEMENT  . TOTAL KNEE ARTHROPLASTY Left 01/31/2013   Procedure: TOTAL KNEE ARTHROPLASTY;  Surgeon: [name], MD;  Location: [place];  Service: Orthopedics;  Laterality: Left;  . TOTAL SHOULDER ARTHROPLASTY  Current Outpatient Medications  Medication Sig Dispense Refill  . amLODipine (NORVASC) 10 MG tablet TAKE 1 TABLET BY MOUTH EVERY DAY 90 tablet 3  . Cyanocobalamin (VITAMIN B12 PO) Take by mouth daily.    . diclofenac sodium (VOLTAREN) 1 % GEL APPLY 2 TO 4 GRAMS TO AFFECTED AREA 4 TIMES DAILY 100 g 4  . DULoxetine (CYMBALTA) 60 MG capsule Take 1 capsule (60 mg total) 2 (two) times daily by mouth. 180 capsule 3  . hydroxychloroquine (PLAQUENIL) 200 MG tablet TAKE 1 TABLET BY MOUTH TWICE A DAY 180 tablet 1  . losartan (COZAAR) 25 MG tablet Take 1 tablet (25 mg total) by mouth daily. 90 tablet 3  . metoprolol succinate (TOPROL-XL) 50 MG 24 hr tablet  Take 1 tablet (50 mg total) by mouth daily. Take with or immediately following a meal. 90 tablet 3  . omeprazole (PRILOSEC) 20 MG capsule TAKE 1 CAPSULE BY MOUTH EVERY DAY 90 capsule 3  . oseltamivir (TAMIFLU) 75 MG capsule Take 1 capsule (75 mg total) by mouth daily. 10 capsule 0  . oxyCODONE-acetaminophen (PERCOCET/ROXICET) 5-325 MG tablet Take 1 tablet by mouth every 4 (four) hours as needed. for pain  0  . rivaroxaban (XARELTO) 20 MG TABS tablet Take 1 tablet (20 mg total) by mouth daily with supper. 30 tablet 5  . traZODone (DESYREL) 50 MG tablet TAKE 1 TO 2 TABLETS BY MOUTH AT BEDTIME AS NEEDED FOR SLEEP 180 tablet 3  . Vitamin D, Ergocalciferol, (DRISDOL) 50000 units CAPS capsule TAKE ONE CAPSULE BY MOUTH EVERY 7 DAYS 4 capsule 4   No current facility-administered medications for this visit.     Allergies:   Patient has no known allergies.    Social History:  The patient  reports that he quit smoking about 9 years ago. His smoking use included cigarettes. He has a 60.00 pack-year smoking history. he has never used smokeless tobacco. He reports that he drinks about 6.0 oz of alcohol per week. He reports that he does not use drugs.   Family History:  The patient's family history includes Alcohol abuse in his father; Alzheimer's disease in his mother; Cancer in his brother; Diabetes in his brother; Heart disease (age of onset: 27) in his father; Kidney failure in his brother; Osteoporosis in his mother.    ROS:  Please see the history of present illness.   Otherwise, review of systems are positive for shoulder pain.   All other systems are reviewed and negative.    PHYSICAL EXAM: VS:  BP 138/82   Pulse 65   Ht 5\' 7"  (1.702 m)   David Weaver 191 lb 3.2 oz (86.7 kg)   BMI 29.95 kg/m  , BMI Body mass index is 29.95 kg/m. GENERAL:  Well appearing HEENT: Pupils equal round and reactive, fundi not visualized, oral mucosa unremarkable NECK:  No jugular venous distention, waveform within normal  limits, carotid upstroke brisk and symmetric, no bruits LUNGS:  Clear to auscultation bilaterally HEART:  RRR.  PMI not displaced or sustained,S1 and S2 within normal limits, no S3, no S4, no clicks, no rubs, no murmurs ABD:  Flat, positive bowel sounds normal in frequency in pitch, no bruits, no rebound, no guarding, no midline pulsatile mass, no hepatomegaly, no splenomegaly EXT:  2 plus pulses throughout, no edema, no cyanosis no clubbing SKIN:  No rashes no nodules NEURO:  Cranial nerves II through XII grossly intact, motor grossly intact throughout PSYCH:  Cognitively intact, oriented to person place and time  EKG:  EKG is not ordered today.  04/22/16: Sinus rhythm rate 64 bpm. Nonspecific T wave changes. 01/28/18: Sinus rhythm.  PACs.   8/15;16: Sinus rhythm 93 bpm.  Non-specific t wave changes. 04/05/14: Ectopic atrial rhythm. Non-specific inferior T wave changes.  Recent Labs: 11/05/2017: Hemoglobin 15.5; Platelets 294 01/18/2018: ALT 32; BUN 10; Creatinine, Ser 0.67; Potassium 4.3; Sodium 141    Lipid Panel    Component Value Date/Time   CHOL 177 01/18/2018 1016   TRIG 255 (H) 01/18/2018 1016   HDL 34 (L) 01/18/2018 1016   CHOLHDL 5.2 (H) 01/18/2018 1016   CHOLHDL 3.9 09/02/2016 1035   VLDL 43 (H) 09/02/2016 1035   LDLCALC 92 01/18/2018 1016      David Weaver Readings from Last 3 Encounters:  01/28/18 191 lb 3.2 oz (86.7 kg)  01/18/18 191 lb (86.6 kg)  11/05/17 199 lb (90.3 kg)      Other studies Reviewed: Additional studies/ records that were reviewed today include:. Review of the above records demonstrates:  Please see elsewhere in the note.     ASSESSMENT AND PLAN:  # Exertional chest pain: David Weaver has new onset exertional chest pain that is concerning for ischemia.  He describes it is a machine running in his left chest and shortness of breath.  I do wonder if this could also be atrial fibrillation.  We will get an exercise Myoview to evaluate for both arrhythmia and  ischemia.  # Paroxysmal atrial fibrillation: David Weaver had an episode of transient atrial fibrillation during surgery and was noted to have recurrent episodes on the 30 day event monitor.  He remains asymptomatic.  Continue metoprolol and Xarelto..  This patients CHA2DS2-VASc Score and unadjusted Ischemic Stroke Rate (% per year) is equal to 2.2 % stroke rate/year from a score of 2  Above score calculated as 1 point each if present [CHF, HTN, DM, Vascular=MI/PAD/Aortic Plaque, Age if 65-74, or Male] Above score calculated as 2 points each if present [Age > 75, or Stroke/TIA/TE]  # Hypertension: BP controlled on amlodipine, losartan and metoprolol.  # Dizziness: Check carotid Dopplers.    Current medicines are reviewed at length with the patient today.  The patient does not have concerns regarding medicines.  The following changes have been made:  no change  Labs/ tests ordered today include:  No orders of the defined types were placed in this encounter.    Disposition:   FU with Dr. Jonelle Sidle C. [place] in 3 months.    Signed, Skeet Latch, MD  01/28/2018 10:44 AM    [place]

## 2018-02-01 ENCOUNTER — Encounter: Payer: Self-pay | Admitting: Family Medicine

## 2018-02-02 ENCOUNTER — Encounter: Payer: Self-pay | Admitting: Family Medicine

## 2018-02-05 ENCOUNTER — Telehealth (HOSPITAL_COMMUNITY): Payer: Self-pay

## 2018-02-05 NOTE — Telephone Encounter (Signed)
Encounter complete. 

## 2018-02-10 ENCOUNTER — Ambulatory Visit (HOSPITAL_COMMUNITY)
Admission: RE | Admit: 2018-02-10 | Discharge: 2018-02-10 | Disposition: A | Discharge status: Home / Self Care | Payer: Medicare Other | Source: Ambulatory Visit | Attending: Cardiovascular Disease | Admitting: Cardiovascular Disease

## 2018-02-10 ENCOUNTER — Other Ambulatory Visit: Payer: Self-pay | Admitting: Cardiovascular Disease

## 2018-02-10 DIAGNOSIS — I6523 Occlusion and stenosis of bilateral carotid arteries: Secondary | ICD-10-CM

## 2018-02-10 DIAGNOSIS — R42 Dizziness and giddiness: Secondary | ICD-10-CM

## 2018-02-10 DIAGNOSIS — I2510 Atherosclerotic heart disease of native coronary artery without angina pectoris: Secondary | ICD-10-CM | POA: Diagnosis not present

## 2018-02-10 DIAGNOSIS — I480 Paroxysmal atrial fibrillation: Secondary | ICD-10-CM | POA: Diagnosis not present

## 2018-02-10 DIAGNOSIS — Z87891 Personal history of nicotine dependence: Secondary | ICD-10-CM | POA: Insufficient documentation

## 2018-02-10 DIAGNOSIS — R079 Chest pain, unspecified: Secondary | ICD-10-CM | POA: Insufficient documentation

## 2018-02-10 DIAGNOSIS — I10 Essential (primary) hypertension: Secondary | ICD-10-CM | POA: Insufficient documentation

## 2018-02-10 LAB — MYOCARDIAL PERFUSION IMAGING
CHL CUP NUCLEAR SRS: 0
CHL CUP NUCLEAR SSS: 1
CSEPEW: 10.5 METS
CSEPPHR: 137 {beats}/min
Exercise duration (min): 9 min
Exercise duration (sec): 32 s
LV dias vol: 99 mL (ref 62–150)
LVSYSVOL: 53 mL
MPHR: 151 {beats}/min
NUC STRESS TID: 1.04
Percent HR: 90 %
RPE: 18
Rest HR: 64 {beats}/min
SDS: 1

## 2018-02-10 MED ORDER — TECHNETIUM TC 99M TETROFOSMIN IV KIT
10.4000 | PACK | Freq: Once | INTRAVENOUS | Status: AC | PRN
  Administered 2018-02-10: 10.4 via INTRAVENOUS
  Filled 2018-02-10: qty 11

## 2018-02-10 MED ORDER — TECHNETIUM TC 99M TETROFOSMIN IV KIT
31.3000 | PACK | Freq: Once | INTRAVENOUS | Status: AC | PRN
  Administered 2018-02-10: 31.3 via INTRAVENOUS
  Filled 2018-02-10: qty 32

## 2018-02-15 ENCOUNTER — Ambulatory Visit
Admission: RE | Admit: 2018-02-15 | Discharge: 2018-02-15 | Disposition: A | Discharge status: Home / Self Care | Payer: Medicare Other | Source: Ambulatory Visit | Attending: Family Medicine | Admitting: Family Medicine

## 2018-02-15 DIAGNOSIS — R1084 Generalized abdominal pain: Secondary | ICD-10-CM

## 2018-02-15 DIAGNOSIS — K5730 Diverticulosis of large intestine without perforation or abscess without bleeding: Secondary | ICD-10-CM | POA: Diagnosis not present

## 2018-02-15 MED ORDER — IOPAMIDOL (ISOVUE-300) INJECTION 61%
100.0000 mL | Freq: Once | INTRAVENOUS | Status: AC | PRN
  Administered 2018-02-15: 100 mL via INTRAVENOUS

## 2018-02-16 ENCOUNTER — Telehealth: Payer: Self-pay | Admitting: *Deleted

## 2018-02-16 ENCOUNTER — Other Ambulatory Visit: Payer: Self-pay | Admitting: Family Medicine

## 2018-02-16 DIAGNOSIS — I480 Paroxysmal atrial fibrillation: Secondary | ICD-10-CM

## 2018-02-16 DIAGNOSIS — R079 Chest pain, unspecified: Secondary | ICD-10-CM

## 2018-02-16 MED ORDER — ATORVASTATIN CALCIUM 10 MG PO TABS: 10.0000 mg | ORAL_TABLET | Freq: Every day | ORAL | 3 refills | Status: DC

## 2018-02-16 NOTE — Telephone Encounter (Signed)
Advised of Stress test. Echo ordered and scheduled. Patient aware of date, time, and location.

## 2018-02-16 NOTE — Telephone Encounter (Signed)
-----   Message from Skeet Latch, MD sent at 02/12/2018  5:37 AM EDT ----- Stress test showed no problems with the blood flow in the heart.  However, it calculated that the heart muscle is weak.  This is sometimes inaccurate.  Please get an echo to better assess.

## 2018-02-18 DIAGNOSIS — M150 Primary generalized (osteo)arthritis: Secondary | ICD-10-CM | POA: Diagnosis not present

## 2018-02-18 DIAGNOSIS — M47812 Spondylosis without myelopathy or radiculopathy, cervical region: Secondary | ICD-10-CM | POA: Diagnosis not present

## 2018-02-18 DIAGNOSIS — Z79891 Long term (current) use of opiate analgesic: Secondary | ICD-10-CM | POA: Diagnosis not present

## 2018-02-18 DIAGNOSIS — M5432 Sciatica, left side: Secondary | ICD-10-CM | POA: Diagnosis not present

## 2018-02-18 DIAGNOSIS — G894 Chronic pain syndrome: Secondary | ICD-10-CM | POA: Diagnosis not present

## 2018-02-18 DIAGNOSIS — M47816 Spondylosis without myelopathy or radiculopathy, lumbar region: Secondary | ICD-10-CM | POA: Diagnosis not present

## 2018-02-23 ENCOUNTER — Ambulatory Visit (HOSPITAL_COMMUNITY): Payer: Medicare Other | Attending: Cardiology

## 2018-02-23 ENCOUNTER — Other Ambulatory Visit: Payer: Self-pay

## 2018-02-23 DIAGNOSIS — I5030 Unspecified diastolic (congestive) heart failure: Secondary | ICD-10-CM | POA: Insufficient documentation

## 2018-02-23 DIAGNOSIS — I480 Paroxysmal atrial fibrillation: Secondary | ICD-10-CM

## 2018-02-23 DIAGNOSIS — R079 Chest pain, unspecified: Secondary | ICD-10-CM

## 2018-02-23 DIAGNOSIS — I420 Dilated cardiomyopathy: Secondary | ICD-10-CM | POA: Diagnosis not present

## 2018-02-28 ENCOUNTER — Other Ambulatory Visit: Payer: Self-pay | Admitting: Family Medicine

## 2018-03-03 DIAGNOSIS — R1032 Left lower quadrant pain: Secondary | ICD-10-CM | POA: Diagnosis not present

## 2018-03-03 DIAGNOSIS — R141 Gas pain: Secondary | ICD-10-CM | POA: Diagnosis not present

## 2018-03-03 DIAGNOSIS — R197 Diarrhea, unspecified: Secondary | ICD-10-CM | POA: Diagnosis not present

## 2018-03-03 DIAGNOSIS — R1031 Right lower quadrant pain: Secondary | ICD-10-CM | POA: Diagnosis not present

## 2018-03-07 ENCOUNTER — Other Ambulatory Visit: Payer: Self-pay | Admitting: Rheumatology

## 2018-03-09 DIAGNOSIS — M19011 Primary osteoarthritis, right shoulder: Secondary | ICD-10-CM | POA: Diagnosis not present

## 2018-03-09 DIAGNOSIS — M19012 Primary osteoarthritis, left shoulder: Secondary | ICD-10-CM | POA: Diagnosis not present

## 2018-03-09 NOTE — Telephone Encounter (Addendum)
Last visit: 11/05/17 Next Visit: 04/13/18 Labs: 11/05/17 Creatinine stable. All other labs WNL. PLQ Eye Exam: 03/04/17 WNL   Okay to refill per Dr. Estanislado Pandy

## 2018-03-10 ENCOUNTER — Encounter: Payer: Self-pay | Admitting: Physician Assistant

## 2018-03-23 ENCOUNTER — Other Ambulatory Visit: Payer: Self-pay | Admitting: Family Medicine

## 2018-03-30 NOTE — Progress Notes (Signed)
Office Visit Note  Patient: David Weaver             Date of Birth: 1950/12/30           MRN: [id number]             PCP: Wardell Honour, MD Referring: Wardell Honour, MD Visit Date: 04/13/2018 Occupation: @GUAROCC @    Subjective:  Medication Management   History of Present Illness: [name] is a 67 y.o. male history of autoimmune disease, osteoarthritis and fibromyalgia.  He states he has been doing quite well without any joint inflammation.  He has some discomfort from osteoarthritis and fibromyalgia but is tolerable.  He has been tolerating Plaquenil well.  He had few episodes of rash on his face which has resolved now.  Activities of Daily Living:  Patient reports morning stiffness for 10 minutes.   Patient Denies nocturnal pain.  Difficulty dressing/grooming: Denies Difficulty climbing stairs: Reports Difficulty getting out of chair: Denies Difficulty using hands for taps, buttons, cutlery, and/or writing: Denies   Review of Systems  Constitutional: Positive for fatigue. Negative for night sweats.  HENT: Negative for mouth sores, mouth dryness and nose dryness.   Eyes: Negative for redness and dryness.  Respiratory: Negative for shortness of breath and difficulty breathing.   Cardiovascular: Negative for chest pain, palpitations, hypertension, irregular heartbeat and swelling in legs/feet.  Gastrointestinal: Negative for constipation and diarrhea.  Endocrine: Negative for increased urination.  Musculoskeletal: Positive for arthralgias, joint pain and morning stiffness. Negative for joint swelling, myalgias, muscle weakness, muscle tenderness and myalgias.  Skin: Positive for rash. Negative for color change, hair loss, nodules/bumps, skin tightness, ulcers and sensitivity to sunlight.  Allergic/Immunologic: Negative for susceptible to infections.  Neurological: Negative for dizziness, fainting, memory loss, night sweats and weakness ( ).  Hematological: Negative  for swollen glands.  Psychiatric/Behavioral: Negative for depressed mood and sleep disturbance. The patient is not nervous/anxious.     PMFS History:  Patient Active Problem List   Diagnosis Date Noted  . Fibromyalgia 04/13/2018  . History of total knee replacement, bilateral 04/13/2018  . DDD (degenerative disc disease), lumbar 04/13/2018  . High risk medication use 03/06/2017  . Paroxysmal atrial fibrillation ([place]) 02/19/2016  . Chronic pain syndrome 02/12/2015  . Allergic rhinitis   . Primary osteoarthritis of both hands   . Esophageal reflux   . Autoimmune disease ([place])   . Hearing loss   . Essential hypertension, benign 11/01/2012  . Other abnormal glucose 11/01/2012  . Depression 11/01/2012  . Insomnia 11/01/2012    Past Medical History:  Diagnosis Date  . Alcohol abuse, daily use   . Allergic rhinitis, cause unspecified   . Dysthymic disorder   . Esophageal reflux   . Essential hypertension, benign   . Fibromyalgia    followed by David Weaver every six months.  . Fibromyositis   . Impotence of organic origin   . Insomnia, unspecified   . Left knee DJD   . Lupus erythematosus   . Osteoarthrosis, unspecified whether generalized or localized, lower leg   . Other abnormal glucose   . Other specified disorder of male genital organs(608.89)   . Systemic lupus erythematosus (David Weaver)   . Unspecified hearing loss   . Unspecified vitamin D deficiency     Family History  Problem Relation Age of Onset  . Alzheimer's disease Mother   . Osteoporosis Mother   . Heart disease Father 1       AMI  .  Alcohol abuse Father   . Cancer Brother        colon cancer  . Diabetes Brother   . Kidney failure Brother    Past Surgical History:  Procedure Laterality Date  . CARPAL TUNNEL RELEASE     bilateral  . CHOLECYSTECTOMY    . COLONOSCOPY  03/01/2014   two polyps; Dr. Benson Norway.  Repeat 5 years.  [name] GANGLION CYST EXCISION  1980   Left wrist  . JOINT REPLACEMENT  01/2013   right knee,  left knee. Wainer.  [name] KNEE ARTHROPLASTY    . KNEE ARTHROSCOPY  2008   Right  . lumbar spine epidural  2009   [facility]  . ROTATOR CUFF REPAIR  10/2008   right  . TOTAL KNEE ARTHROPLASTY  12/27/2012   Procedure: TOTAL KNEE ARTHROPLASTY;  Surgeon: David Junes, MD;  Location: [place];  Service: Orthopedics;  Laterality: Right;  RIGHT ARTHROPLASTY KNEE MEDIAL AND LATERAL COMPARTMENTS WITH PATELLA RESURFACING, RIGHT TOTAL KNEE REPLACEMENT  . TOTAL KNEE ARTHROPLASTY Left 01/31/2013   Procedure: TOTAL KNEE ARTHROPLASTY;  Surgeon: David Junes, MD;  Location: [place];  Service: Orthopedics;  Laterality: Left;  . TOTAL SHOULDER ARTHROPLASTY     Social History   Social History Narrative   Guns in the home stored in locked cabinet.       Caffeine use: 1 serving/ day.     Marital status:  Married x 25 years, third marriage, happily married.      Children: one son (80); one step-daughter; two grandsons      Lives: with wife. Mother-in-law going to move in in 2017.      Employment:  Works at [employer] on [employer]; [employer] work; happy; 25 hours per week.      Tobacco: none currently; quit 2013.  Smoked x 40 years.      Alcohol:  2-6 beers per night in 2018; much heavier use in fifties.      Drugs: none      Exercise:  None in 2017.      Seatbelt: 100%      ADLs: independent with ADLs in 2017; no assistant devices      Advanced Directives: none in 2017; FULL CODE in 2017; no prolonged measures     Objective: Vital Signs: BP 125/79 (BP Location: Left Arm, Patient Position: Sitting, Cuff Size: Normal)   Pulse 71   Resp 14   Ht 5\' 8"  (1.727 m)   Wt 195 lb (88.5 kg)   BMI 29.65 kg/m    Physical Exam  Constitutional: He is oriented to person, place, and time. He appears well-developed and well-nourished.  HENT:  Head: Normocephalic and atraumatic.  Eyes: Pupils are equal, round, and reactive to light. Conjunctivae and EOM are normal.  Neck: Normal range of motion. Neck supple.    Cardiovascular: Normal rate, regular rhythm and normal heart sounds.  Pulmonary/Chest: Effort normal and breath sounds normal.  Abdominal: Soft. Bowel sounds are normal.  Neurological: He is alert and oriented to person, place, and time.  Skin: Skin is warm and dry. Capillary refill takes less than 2 seconds.  Psychiatric: He has a normal mood and affect. His behavior is normal.  Nursing note and vitals reviewed.    Musculoskeletal Exam: C-spine thoracic spine with good range of motion.  He has some limitation of range of motion of lumbar spine.  Shoulder joints elbow joints wrist joints in good range of motion.  He has some DIP  PIP thickening in his hands consistent with osteoarthritis.  Hip joints were good range of motion.  He had bilateral total knee replacement which appears to be doing well.  He does have some generalized hyperalgesia and positive tender points from fibromyalgia.  CDAI Exam: No CDAI exam completed.    Investigation: No additional findings.PLQ eye exam: 03/04/2017 CBC Latest Ref Rng & Units 11/05/2017 07/24/2017 03/03/2017  WBC 3.8 - 10.8 Thousand/uL 5.8 4.9 4.7  Hemoglobin 13.2 - 17.1 g/dL 15.5 15.4 15.5  Hematocrit 38.5 - 50.0 % 44.8 45.3 46.0  Platelets 140 - 400 Thousand/uL 294 236 265   CMP Latest Ref Rng & Units 01/18/2018 11/05/2017 09/07/2017  Glucose 65 - 99 mg/dL 95 77 124(H)  BUN 8 - 27 mg/dL 10 15 14   Creatinine 0.76 - 1.27 mg/dL 0.67(L) 0.60(L) 0.71(L)  Sodium 134 - 144 mmol/L 141 138 139  Potassium 3.5 - 5.2 mmol/L 4.3 4.1 3.9  Chloride 96 - 106 mmol/L 104 103 100  CO2 20 - 29 mmol/L 23 25 18(L)  Calcium 8.6 - 10.2 mg/dL 9.1 9.3 8.9  Total Protein 6.0 - 8.5 g/dL 6.5 6.5 6.5  Total Bilirubin 0.0 - 1.2 mg/dL 0.4 0.5 <0.2  Alkaline Phos 39 - 117 IU/L 73 - 86  AST 0 - 40 IU/L 18 18 21   ALT 0 - 44 IU/L 32 30 48(H)    Imaging: No results found.  Speciality Comments: No specialty comments available.    Procedures:  No procedures  performed Allergies: Patient has no known allergies.   Assessment / Plan:     Visit Diagnoses: Autoimmune disease ([place]) - +ANA, +Ro, +La. history of DLE and sicca -he appears to be doing quite well with some mild sicca symptoms.  He states he had 2 episodes of rash which responded to topical therapy.  He does not have any synovitis on examination.  Use of sunscreen was discussed at length.  Plan: Urinalysis, Routine w reflex microscopic, Anti-DNA antibody, double-stranded, C3 and C4, Sedimentation rate  High risk medication use - PLQ 200 mg p.o. twice daily.  Patient states he has eye exam in the next couple of weeks.  I have given him a form to be faxed to [place] after ophthalmologic evaluation.[name]  He discontinued Methotrexate and folic acid due to a memory loss after starting MTX.eye exam: 03/04/2017 - Plan: CBC with Differential/Platelet, COMPLETE METABOLIC PANEL WITH GFR  Fibromyalgia-he continues to have some generalized pain and discomfort.  Primary osteoarthritis of both hands-joint protection muscle strengthening discussed.  History of total knee replacement, bilateral-doing well he still have some discomfort in his knee and difficulty with stairs.  DDD (degenerative disc disease), lumbar-chronic pain.  History of depression  History of hypertension-his blood pressure is well controlled.  History of gastroesophageal reflux (GERD)    Orders: Orders Placed This Encounter  Procedures  . CBC with Differential/Platelet  . COMPLETE METABOLIC PANEL WITH GFR  . Urinalysis, Routine w reflex microscopic  . Anti-DNA antibody, double-stranded  . C3 and C4  . Sedimentation rate   No orders of the defined types were placed in this encounter.   Face-to-face time spent with patient was 30 minutes. >50% of time was spent in counseling and coordination of care.  Follow-Up Instructions: Return in about 5 months (around 09/13/2018) for Autoimmune disease, Osteoarthritis.   Bo Merino,  MD  Note - This record has been created using Editor, commissioning.  Chart creation errors have been sought, but may not always  have been  located. Such creation errors do not reflect on  the standard of medical care.

## 2018-04-06 ENCOUNTER — Other Ambulatory Visit: Payer: Self-pay | Admitting: Family Medicine

## 2018-04-07 ENCOUNTER — Telehealth: Payer: Self-pay | Admitting: Cardiovascular Disease

## 2018-04-07 DIAGNOSIS — R197 Diarrhea, unspecified: Secondary | ICD-10-CM | POA: Diagnosis not present

## 2018-04-07 NOTE — Telephone Encounter (Signed)
Received progress notes from [facility] on 04/07/18, Appt 04/28/18 @ 9:20am.[place]

## 2018-04-13 ENCOUNTER — Ambulatory Visit (INDEPENDENT_AMBULATORY_CARE_PROVIDER_SITE_OTHER): Payer: Medicare Other | Admitting: Rheumatology

## 2018-04-13 ENCOUNTER — Encounter: Payer: Self-pay | Admitting: Rheumatology

## 2018-04-13 VITALS — BP 125/79 | HR 71 | Resp 14 | Ht 68.0 in | Wt 195.0 lb

## 2018-04-13 DIAGNOSIS — Z96653 Presence of artificial knee joint, bilateral: Secondary | ICD-10-CM | POA: Diagnosis not present

## 2018-04-13 DIAGNOSIS — M19042 Primary osteoarthritis, left hand: Secondary | ICD-10-CM

## 2018-04-13 DIAGNOSIS — Z79899 Other long term (current) drug therapy: Secondary | ICD-10-CM | POA: Diagnosis not present

## 2018-04-13 DIAGNOSIS — M5136 Other intervertebral disc degeneration, lumbar region: Secondary | ICD-10-CM | POA: Diagnosis not present

## 2018-04-13 DIAGNOSIS — D8989 Other specified disorders involving the immune mechanism, not elsewhere classified: Secondary | ICD-10-CM | POA: Diagnosis not present

## 2018-04-13 DIAGNOSIS — M19041 Primary osteoarthritis, right hand: Secondary | ICD-10-CM

## 2018-04-13 DIAGNOSIS — Z8719 Personal history of other diseases of the digestive system: Secondary | ICD-10-CM

## 2018-04-13 DIAGNOSIS — Z8679 Personal history of other diseases of the circulatory system: Secondary | ICD-10-CM

## 2018-04-13 DIAGNOSIS — I6523 Occlusion and stenosis of bilateral carotid arteries: Secondary | ICD-10-CM | POA: Diagnosis not present

## 2018-04-13 DIAGNOSIS — M359 Systemic involvement of connective tissue, unspecified: Secondary | ICD-10-CM

## 2018-04-13 DIAGNOSIS — Z8659 Personal history of other mental and behavioral disorders: Secondary | ICD-10-CM | POA: Diagnosis not present

## 2018-04-13 DIAGNOSIS — M797 Fibromyalgia: Secondary | ICD-10-CM | POA: Diagnosis not present

## 2018-04-13 DIAGNOSIS — M51369 Other intervertebral disc degeneration, lumbar region without mention of lumbar back pain or lower extremity pain: Secondary | ICD-10-CM | POA: Insufficient documentation

## 2018-04-14 DIAGNOSIS — Z79899 Other long term (current) drug therapy: Secondary | ICD-10-CM | POA: Diagnosis not present

## 2018-04-14 DIAGNOSIS — L932 Other local lupus erythematosus: Secondary | ICD-10-CM | POA: Diagnosis not present

## 2018-04-14 LAB — COMPLETE METABOLIC PANEL WITH GFR
AG Ratio: 2 (calc) (ref 1.0–2.5)
ALT: 28 U/L (ref 9–46)
AST: 17 U/L (ref 10–35)
Albumin: 4.3 g/dL (ref 3.6–5.1)
Alkaline phosphatase (APISO): 75 U/L (ref 40–115)
BUN/Creatinine Ratio: 21 (calc) (ref 6–22)
BUN: 14 mg/dL (ref 7–25)
CALCIUM: 9.3 mg/dL (ref 8.6–10.3)
CO2: 27 mmol/L (ref 20–32)
CREATININE: 0.66 mg/dL — AB (ref 0.70–1.25)
Chloride: 103 mmol/L (ref 98–110)
GFR, EST NON AFRICAN AMERICAN: 99 mL/min/{1.73_m2} (ref >=60)
GFR, Est African American: 114 mL/min/{1.73_m2} (ref >=60)
GLOBULIN: 2.1 g/dL (ref 1.9–3.7)
GLUCOSE: 90 mg/dL (ref 65–99)
Potassium: 4.4 mmol/L (ref 3.5–5.3)
SODIUM: 137 mmol/L (ref 135–146)
Total Bilirubin: 0.4 mg/dL (ref 0.2–1.2)
Total Protein: 6.4 g/dL (ref 6.1–8.1)

## 2018-04-14 LAB — CBC WITH DIFFERENTIAL/PLATELET
Basophils Absolute: 62 cells/uL (ref 0–200)
Basophils Relative: 1.1 %
EOS PCT: 4.1 %
Eosinophils Absolute: 230 cells/uL (ref 15–500)
HEMATOCRIT: 43 % (ref 38.5–50.0)
Hemoglobin: 15 g/dL (ref 13.2–17.1)
LYMPHS ABS: 1277 {cells}/uL (ref 850–3900)
MCH: 29.2 pg (ref 27.0–33.0)
MCHC: 34.9 g/dL (ref 32.0–36.0)
MCV: 83.8 fL (ref 80.0–100.0)
MONOS PCT: 10.8 %
MPV: 9.8 fL (ref 7.5–12.5)
NEUTROS ABS: 3427 {cells}/uL (ref 1500–7800)
NEUTROS PCT: 61.2 %
Platelets: 282 10*3/uL (ref 140–400)
RBC: 5.13 10*6/uL (ref 4.20–5.80)
RDW: 13.7 % (ref 11.0–15.0)
Total Lymphocyte: 22.8 %
WBC mixed population: 605 cells/uL (ref 200–950)
WBC: 5.6 10*3/uL (ref 3.8–10.8)

## 2018-04-14 LAB — URINALYSIS, ROUTINE W REFLEX MICROSCOPIC
BILIRUBIN URINE: NEGATIVE
GLUCOSE, UA: NEGATIVE
Hgb urine dipstick: NEGATIVE
Ketones, ur: NEGATIVE
LEUKOCYTES UA: NEGATIVE
Nitrite: NEGATIVE
PH: 6 (ref 5.0–8.0)
Protein, ur: NEGATIVE
Specific Gravity, Urine: 1.019 (ref 1.001–1.03)

## 2018-04-14 LAB — ANTI-DNA ANTIBODY, DOUBLE-STRANDED: ds DNA Ab: 5 IU/mL — ABNORMAL HIGH

## 2018-04-14 LAB — SEDIMENTATION RATE: SED RATE: 2 mm/h (ref 0–20)

## 2018-04-14 LAB — C3 AND C4
C3 Complement: 129 mg/dL (ref 82–185)
C4 COMPLEMENT: 19 mg/dL (ref 15–53)

## 2018-04-14 NOTE — Progress Notes (Signed)
Labs are stable.

## 2018-04-15 DIAGNOSIS — M47812 Spondylosis without myelopathy or radiculopathy, cervical region: Secondary | ICD-10-CM | POA: Diagnosis not present

## 2018-04-15 DIAGNOSIS — Z79891 Long term (current) use of opiate analgesic: Secondary | ICD-10-CM | POA: Diagnosis not present

## 2018-04-15 DIAGNOSIS — M47816 Spondylosis without myelopathy or radiculopathy, lumbar region: Secondary | ICD-10-CM | POA: Diagnosis not present

## 2018-04-15 DIAGNOSIS — M150 Primary generalized (osteo)arthritis: Secondary | ICD-10-CM | POA: Diagnosis not present

## 2018-04-19 ENCOUNTER — Ambulatory Visit: Payer: Medicare Other | Admitting: Family Medicine

## 2018-04-27 ENCOUNTER — Encounter: Payer: Self-pay | Admitting: Family Medicine

## 2018-04-28 ENCOUNTER — Ambulatory Visit (INDEPENDENT_AMBULATORY_CARE_PROVIDER_SITE_OTHER): Payer: Medicare Other | Admitting: Cardiovascular Disease

## 2018-04-28 ENCOUNTER — Encounter: Payer: Self-pay | Admitting: Cardiovascular Disease

## 2018-04-28 VITALS — BP 112/70 | HR 83 | Ht 68.0 in | Wt 193.0 lb

## 2018-04-28 DIAGNOSIS — I480 Paroxysmal atrial fibrillation: Secondary | ICD-10-CM

## 2018-04-28 DIAGNOSIS — I6523 Occlusion and stenosis of bilateral carotid arteries: Secondary | ICD-10-CM | POA: Diagnosis not present

## 2018-04-28 DIAGNOSIS — R42 Dizziness and giddiness: Secondary | ICD-10-CM

## 2018-04-28 DIAGNOSIS — I10 Essential (primary) hypertension: Secondary | ICD-10-CM

## 2018-04-28 NOTE — Patient Instructions (Signed)
Medication Instructions:  Your physician recommends that you continue on your current medications as directed. Please refer to the Current Medication list given to you today.  Labwork: none  Testing/Procedures: Your physician has requested that you have a carotid duplex. This test is an ultrasound of the carotid arteries in your neck. It looks at blood flow through these arteries that supply the brain with blood. Allow one hour for this exam. There are no restrictions or special instructions. 1 year   Follow-Up: Your physician wants you to follow-up in: 1 year after carotid doppler You will receive a reminder letter in the mail two months in advance. If you don't receive a letter, please call our office to schedule the follow-up appointment..  If you need a refill on your cardiac medications before your next appointment, please call your pharmacy.

## 2018-04-28 NOTE — Progress Notes (Signed)
Cardiology Office Note   Date:  04/28/2018   ID:  David Weaver, David Weaver [birth year], MRN [id number]  PCP:  Wardell Honour, MD  Cardiologist:   Skeet Latch, MD   Chief Complaint  Patient presents with  . Follow-up    pt denied chest pain      History of Present Illness: David Weaver is a 67 y.o. male with paroxysmal atrial fibrillation, hypertension, mild carotid stenosis, and lupus who presents for follow up.  [name] was seen in clinic on 08/07/15 for a presurgical assessment.  At that time he was cleared for shoulder surgery.  During surgery [name] went into atrial fibrillation with RVR.  He reportedly went back into sinus rhythm without intervention. He was started on Eliquis 5mg  daily and referred to follow up with cardiology.  He subsequently had a 30 day event monitor to determine whether he needed to continue on anticoagulation. The monitor did reveal recurrent atrial fibrillation so anticoagulation was continued.    At his last appointment [name] reported exertional chest pain.  He was referred for an exercise Myoview 01/2018 that revealed LVEF 46% with global hypokinesis.  There was no ischemia.   he achieved 10.5 METS on a Bruce protocol.  He was then referred for an echo that revealed LVEF 55 to 60% with grade 1 diastolic dysfunction.  At that appointment he also reported dizziness and had carotid Dopplers that revealed 1 to 39% ICA stenosis bilaterally.  Since his last appointment [name] has been feeling well.  He has not had any recurrent chest pain or pressure.  His breathing has been stable.  He denies any lightheadedness or dizziness.  He recently joined the gym and occasionally goes walking.  He also does yard work and works on his boat.  He has no exertional symptoms.  He has mild lower extremity edema that improves with elevation of his legs.  He denies orthopnea or PND.  He does not think he had any recurrent episodes of atrial fibrillation.   Past Medical  History:  Diagnosis Date  . Alcohol abuse, daily use   . Allergic rhinitis, cause unspecified   . Dysthymic disorder   . Esophageal reflux   . Essential hypertension, benign   . Fibromyalgia    followed by [name] every six months.  . Fibromyositis   . Impotence of organic origin   . Insomnia, unspecified   . Left knee DJD   . Lupus erythematosus   . Osteoarthrosis, unspecified whether generalized or localized, lower leg   . Other abnormal glucose   . Other specified disorder of male genital organs(608.89)   . Systemic lupus erythematosus ([name])   . Unspecified hearing loss   . Unspecified vitamin D deficiency     Past Surgical History:  Procedure Laterality Date  . CARPAL TUNNEL RELEASE     bilateral  . CHOLECYSTECTOMY    . COLONOSCOPY  03/01/2014   two polyps; Dr. Benson Norway.  Repeat 5 years.  [name] GANGLION CYST EXCISION  1980   Left wrist  . JOINT REPLACEMENT  01/2013   right knee, left knee. [facility].  [name] KNEE ARTHROPLASTY    . KNEE ARTHROSCOPY  2008   Right  . lumbar spine epidural  2009   [facility]  . ROTATOR CUFF REPAIR  10/2008   right  . TOTAL KNEE ARTHROPLASTY  12/27/2012   Procedure: TOTAL KNEE ARTHROPLASTY;  Surgeon: [name], MD;  Location: [facility];  Service: Orthopedics;  Laterality: Right;  RIGHT ARTHROPLASTY KNEE MEDIAL AND LATERAL COMPARTMENTS WITH PATELLA RESURFACING, RIGHT TOTAL KNEE REPLACEMENT  . TOTAL KNEE ARTHROPLASTY Left 01/31/2013   Procedure: TOTAL KNEE ARTHROPLASTY;  Surgeon: [name], MD;  Location: [facility];  Service: Orthopedics;  Laterality: Left;  . TOTAL SHOULDER ARTHROPLASTY       Current Outpatient Medications  Medication Sig Dispense Refill  . amLODipine (NORVASC) 10 MG tablet TAKE 1 TABLET BY MOUTH EVERY DAY 90 tablet 3  . atorvastatin (LIPITOR) 10 MG tablet Take 1 tablet (10 mg total) by mouth daily. 90 tablet 3  . Cholecalciferol (VITAMIN D PO) Take by mouth daily.    . colestipol (COLESTID) 1 g tablet Take 2 g by mouth  2 (two) times daily.  5  . Cyanocobalamin (VITAMIN B12 PO) Take by mouth daily.    . diclofenac sodium (VOLTAREN) 1 % GEL APPLY 2 TO 4 GRAMS TO AFFECTED AREA 4 TIMES DAILY 100 g 4  . DULoxetine (CYMBALTA) 60 MG capsule Take 1 capsule (60 mg total) 2 (two) times daily by mouth. 180 capsule 3  . hydroxychloroquine (PLAQUENIL) 200 MG tablet TAKE 1 TABLET BY MOUTH TWICE A DAY 180 tablet 0  . losartan (COZAAR) 25 MG tablet Take 1 tablet (25 mg total) by mouth daily. 90 tablet 3  . metoprolol succinate (TOPROL-XL) 50 MG 24 hr tablet Take 1 tablet (50 mg total) by mouth daily. Take with or immediately following a meal. 90 tablet 3  . omeprazole (PRILOSEC) 20 MG capsule TAKE 1 CAPSULE BY MOUTH EVERY DAY 90 capsule 3  . oxyCODONE-acetaminophen (PERCOCET/ROXICET) 5-325 MG tablet Take 1 tablet by mouth every 4 (four) hours as needed. for pain  0  . rivaroxaban (XARELTO) 20 MG TABS tablet Take 1 tablet (20 mg total) by mouth daily with supper. 30 tablet 5  . traZODone (DESYREL) 50 MG tablet TAKE 1 TO 2 TABLETS BY MOUTH AT BEDTIME AS NEEDED FOR SLEEP 180 tablet 3   No current facility-administered medications for this visit.     Allergies:   Patient has no known allergies.    Social History:  The patient  reports that he quit smoking about 9 years ago. His smoking use included cigarettes. He has a 60.00 pack-year smoking history. He has never used smokeless tobacco. He reports that he drinks about 6.0 oz of alcohol per week. He reports that he does not use drugs.   Family History:  The patient's family history includes Alcohol abuse in his father; Alzheimer's disease in his mother; Cancer in his brother; Diabetes in his brother; Heart disease (age of onset: 83) in his father; Kidney failure in his brother; Osteoporosis in his mother.    ROS:  Please see the history of present illness.   Otherwise, review of systems are positive for shoulder pain.   All other systems are reviewed and negative.     PHYSICAL EXAM: VS:  BP 112/70   Pulse 83   Ht 5\' 8"  (1.727 m)   Wt 193 lb (87.5 kg)   SpO2 95%   BMI 29.35 kg/m  , BMI Body mass index is 29.35 kg/m. GENERAL:  Well appearing HEENT: Pupils equal round and reactive, fundi not visualized, oral mucosa unremarkable NECK:  No jugular venous distention, waveform within normal limits, carotid upstroke brisk and symmetric, no bruits LUNGS:  Clear to auscultation bilaterally HEART:  RRR.  PMI not displaced or sustained,S1 and S2 within normal limits, no S3, no S4, no  clicks, no rubs, no murmurs ABD:  Flat, positive bowel sounds normal in frequency in pitch, no bruits, no rebound, no guarding, no midline pulsatile mass, no hepatomegaly, no splenomegaly EXT:  2 plus pulses throughout, no edema, no cyanosis no clubbing SKIN:  No rashes no nodules NEURO:  Cranial nerves II through XII grossly intact, motor grossly intact throughout PSYCH:  Cognitively intact, oriented to person place and time    EKG:  EKG is not ordered today.  04/22/16: Sinus rhythm rate 64 bpm. Nonspecific T wave changes. 01/28/18: Sinus rhythm.  PACs. 8/15;16: Sinus rhythm 93 bpm.  Non-specific t wave changes. 04/05/14: Ectopic atrial rhythm. Non-specific inferior T wave changes.  Lexiscan Myoview 02/10/18:  The left ventricular ejection fraction is mildly decreased (45-54%).  Nuclear stress EF: 46%. Mild generalized hypokinesis  There was no ST segment deviation noted during stress.  The study is normal.  This is a low risk study. No ischemia. Prior ECHO 2016 - normal EF.  Echo 02/23/18: Study Conclusions  - Left ventricle: The cavity size was normal. Systolic function was   normal. The estimated ejection fraction was in the range of 55%   to 60%. Wall motion was normal; there were no regional wall   motion abnormalities. Doppler parameters are consistent with   abnormal left ventricular relaxation (grade 1 diastolic   dysfunction). - Left atrium: The atrium  was mildly dilated.  Carotid Doppler 02/10/18: 1-39% ICA stenosis bilaterally  Recent Labs: 04/13/2018: ALT 28; BUN 14; Creat 0.66; Hemoglobin 15.0; Platelets 282; Potassium 4.4; Sodium 137    Lipid Panel    Component Value Date/Time   CHOL 177 01/18/2018 1016   TRIG 255 (H) 01/18/2018 1016   HDL 34 (L) 01/18/2018 1016   CHOLHDL 5.2 (H) 01/18/2018 1016   CHOLHDL 3.9 09/02/2016 1035   VLDL 43 (H) 09/02/2016 1035   LDLCALC 92 01/18/2018 1016      Wt Readings from Last 3 Encounters:  04/28/18 193 lb (87.5 kg)  04/13/18 195 lb (88.5 kg)  02/10/18 191 lb (86.6 kg)      Other studies Reviewed: Additional studies/ records that were reviewed today include:. Review of the above records demonstrates:  Please see elsewhere in the note.     ASSESSMENT AND PLAN:  # Exertional chest pain: Resolved.  Exercise Myoview was negative for ischemia.  He was encouraged to increase his exercise to 150 minutes per week.  # Carotid stenosis: Mild 01/2018.  Repeat in 1 year.  Continue Xarelto and atorvastatin.  # Paroxysmal atrial fibrillation:  Mr. Watkinson had an episode of transient atrial fibrillation during surgery and was noted to have recurrent episodes on the 30 day event monitor.  He remains asymptomatic and denies any recent episodes.  Continue metoprolol and Xarelto.  This patients CHA2DS2-VASc Score and unadjusted Ischemic Stroke Rate (% per year) is equal to 2.2 % stroke rate/year from a score of 2  Above score calculated as 1 point each if present [CHF, HTN, DM, Vascular=MI/PAD/Aortic Plaque, Age if 65-74, or Male] Above score calculated as 2 points each if present [Age > 75, or Stroke/TIA/TE]  # Hypertension: BP well-controlled on amlodipine, losartan and metoprolol.  # Dizziness:Resolved.  Only mild carotid stenosis on Dopplers.    Current medicines are reviewed at length with the patient today.  The patient does not have concerns regarding medicines.  The following changes have  been made:  no change  Labs/ tests ordered today include:  No orders of the defined types  were placed in this encounter.    Disposition:   FU with Dr. Jonelle Sidle C. Oval Linsey in 1 year.  Signed, Skeet Latch, MD  04/28/2018 9:46 AM    [place]

## 2018-05-05 ENCOUNTER — Ambulatory Visit (INDEPENDENT_AMBULATORY_CARE_PROVIDER_SITE_OTHER): Payer: Medicare Other

## 2018-05-05 ENCOUNTER — Encounter: Payer: Self-pay | Admitting: Family Medicine

## 2018-05-05 ENCOUNTER — Ambulatory Visit (INDEPENDENT_AMBULATORY_CARE_PROVIDER_SITE_OTHER): Payer: Medicare Other | Admitting: Family Medicine

## 2018-05-05 ENCOUNTER — Other Ambulatory Visit: Payer: Self-pay

## 2018-05-05 VITALS — BP 102/72 | HR 88 | Temp 98.7°F | Resp 16 | Ht 66.93 in | Wt 188.0 lb

## 2018-05-05 DIAGNOSIS — R05 Cough: Secondary | ICD-10-CM | POA: Diagnosis not present

## 2018-05-05 DIAGNOSIS — J0110 Acute frontal sinusitis, unspecified: Secondary | ICD-10-CM

## 2018-05-05 DIAGNOSIS — J9801 Acute bronchospasm: Secondary | ICD-10-CM | POA: Diagnosis not present

## 2018-05-05 DIAGNOSIS — I6523 Occlusion and stenosis of bilateral carotid arteries: Secondary | ICD-10-CM

## 2018-05-05 MED ORDER — IPRATROPIUM BROMIDE 0.03 % NA SOLN: 2.0000 | Freq: Two times a day (BID) | NASAL | 0 refills | Status: DC

## 2018-05-05 MED ORDER — BENZONATATE 100 MG PO CAPS: 100.0000 mg | ORAL_CAPSULE | Freq: Three times a day (TID) | ORAL | 0 refills | Status: DC | PRN

## 2018-05-05 MED ORDER — LEVALBUTEROL HCL 0.63 MG/3ML IN NEBU
0.6300 mg | INHALATION_SOLUTION | Freq: Once | RESPIRATORY_TRACT | Status: AC
  Administered 2018-05-05: 0.63 mg via RESPIRATORY_TRACT

## 2018-05-05 MED ORDER — PREDNISONE 20 MG PO TABS: ORAL_TABLET | ORAL | 0 refills | Status: DC

## 2018-05-05 MED ORDER — DOXYCYCLINE HYCLATE 100 MG PO CAPS: 100.0000 mg | ORAL_CAPSULE | Freq: Two times a day (BID) | ORAL | 0 refills | Status: DC

## 2018-05-05 NOTE — Patient Instructions (Addendum)
     IF you received an x-ray today, you will receive an invoice from [facility]. Please contact [facility] at [phone] with questions or concerns regarding your invoice.   IF you received labwork today, you will receive an invoice from [facility]. Please contact [facility] at [phone] with questions or concerns regarding your invoice.   Our billing staff will not be able to assist you with questions regarding bills from these companies.  You will be contacted with the lab results as soon as they are available. The fastest way to get your results is to activate your My Chart account. Instructions are located on the last page of this paperwork. If you have not heard from us regarding the results in 2 weeks, please contact this office.     Sinusitis, Adult Sinusitis is soreness and inflammation of your sinuses. Sinuses are hollow spaces in the bones around your face. They are located:  Around your eyes.  In the middle of your forehead.  Behind your nose.  In your cheekbones.  Your sinuses and nasal passages are lined with a stringy fluid (mucus). Mucus normally drains out of your sinuses. When your nasal tissues get inflamed or swollen, the mucus can get trapped or blocked so air cannot flow through your sinuses. This lets bacteria, viruses, and funguses grow, and that leads to infection. Follow these instructions at home: Medicines  Take, use, or apply over-the-counter and prescription medicines only as told by your doctor. These may include nasal sprays.  If you were prescribed an antibiotic medicine, take it as told by your doctor. Do not stop taking the antibiotic even if you start to feel better. Hydrate and Humidify  Drink enough water to keep your pee (urine) clear or pale yellow.  Use a cool mist humidifier to keep the humidity level in your home above 50%.  Breathe in steam for 10-15 minutes, 3-4 times a day or as told by your doctor. You can do  this in the bathroom while a hot shower is running.  Try not to spend time in cool or dry air. Rest  Rest as much as possible.  Sleep with your head raised (elevated).  Make sure to get enough sleep each night. General instructions  Put a warm, moist washcloth on your face 3-4 times a day or as told by your doctor. This will help with discomfort.  Wash your hands often with soap and water. If there is no soap and water, use hand sanitizer.  Do not smoke. Avoid being around people who are smoking (secondhand smoke).  Keep all follow-up visits as told by your doctor. This is important. Contact a doctor if:  You have a fever.  Your symptoms get worse.  Your symptoms do not get better within 10 days. Get help right away if:  You have a very bad headache.  You cannot stop throwing up (vomiting).  You have pain or swelling around your face or eyes.  You have trouble seeing.  You feel confused.  Your neck is stiff.  You have trouble breathing. This information is not intended to replace advice given to you by your health care provider. Make sure you discuss any questions you have with your health care provider. Document Released: 05/05/2008 Document Revised: 07/13/2016 Document Reviewed: 09/12/2015 Elsevier Interactive Patient Education  2018 [employer]  

## 2018-05-05 NOTE — Progress Notes (Signed)
Subjective:    Patient ID: David Weaver, male    DOB: 1951/02/23, 67 y.o.   MRN: [id number]  05/05/2018  Sinus Problem (pt states he has been having congestiuon and nasal drainage x 5 days ) and Headache (pt states he has been having pressure headaches )    HPI This 68 y.o. male presents for evaluation oF HEAD CONGESTION, NASAL CONGESTION, HEADACHE for five days.  Feels terrible.  Onset three days ago with cough with sputum production.  No fever but +chills; no sweats.  +HA severe.  No ear pain.  +sinus rinse.  No sore throat.  +rhinorrhea excessive; sputum is green.  Mild SOB with ambulation.  No nausea or vomiting; +diarrhea yesterday.  Feels terrible.  Taking Theraflu.   No tick bites.  BP Readings from Last 3 Encounters:  06/16/18 110/68  06/01/18 112/70  05/05/18 102/72   Wt Readings from Last 3 Encounters:  06/16/18 192 lb (87.1 kg)  06/01/18 190 lb 12.8 oz (86.5 kg)  05/05/18 188 lb (85.3 kg)   Immunization History  Administered Date(s) Administered  . Influenza, Seasonal, Injecte, Preservative Fre 11/01/2012  . Influenza,inj,Quad PF,6+ Mos 11/21/2013, 10/11/2014, 10/03/2015, 09/02/2016, 09/07/2017  . Influenza-Unspecified 08/20/2009, 08/19/2010, 08/12/2011  . Pneumococcal Conjugate-13 02/12/2015  . Pneumococcal Polysaccharide-23 02/19/2016  . Pneumococcal-Unspecified 03/03/2008  . Td 07/01/2001  . Tdap 02/03/2012  . Zoster 08/12/2011    Review of Systems  Constitutional: Positive for chills, diaphoresis, fatigue and fever. Negative for activity change, appetite change and unexpected weight change.  HENT: Positive for congestion, postnasal drip, rhinorrhea, sinus pressure, sinus pain and sore throat. Negative for dental problem, drooling, ear discharge, ear pain, facial swelling, hearing loss, mouth sores, nosebleeds, sneezing, tinnitus, trouble swallowing and voice change.   Eyes: Negative for photophobia, pain, discharge, redness, itching and visual disturbance.    Respiratory: Positive for cough and wheezing. Negative for apnea, choking, chest tightness, shortness of breath and stridor.   Cardiovascular: Negative for chest pain, palpitations and leg swelling.  Gastrointestinal: Negative for abdominal pain, blood in stool, constipation, diarrhea, nausea and vomiting.  Endocrine: Negative for cold intolerance, heat intolerance, polydipsia, polyphagia and polyuria.  Genitourinary: Negative for decreased urine volume, difficulty urinating, discharge, dysuria, enuresis, flank pain, frequency, genital sores, hematuria, penile pain, penile swelling, scrotal swelling, testicular pain and urgency.  Musculoskeletal: Negative for arthralgias, back pain, gait problem, joint swelling, myalgias, neck pain and neck stiffness.  Skin: Negative for color change, pallor, rash and wound.  Allergic/Immunologic: Negative for environmental allergies, food allergies and immunocompromised state.  Neurological: Negative for dizziness, tremors, seizures, syncope, facial asymmetry, speech difficulty, weakness, light-headedness, numbness and headaches.  Hematological: Negative for adenopathy. Does not bruise/bleed easily.  Psychiatric/Behavioral: Negative for agitation, behavioral problems, confusion, decreased concentration, dysphoric mood, hallucinations, self-injury, sleep disturbance and suicidal ideas. The patient is not nervous/anxious and is not hyperactive.     Past Medical History:  Diagnosis Date  . Alcohol abuse, daily use   . Allergic rhinitis, cause unspecified   . Dysthymic disorder   . Esophageal reflux   . Essential hypertension, benign   . Fibromyalgia    followed by [name] every six months.  . Fibromyositis   . Impotence of organic origin   . Insomnia, unspecified   . Left knee DJD   . Lupus erythematosus   . Osteoarthrosis, unspecified whether generalized or localized, lower leg   . Other abnormal glucose   . 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 Systemic  lupus erythematosus ([place])   . Unspecified hearing loss   . Unspecified vitamin D deficiency    Past Surgical History:  Procedure Laterality Date  . CARPAL TUNNEL RELEASE     bilateral  . CHOLECYSTECTOMY    . COLONOSCOPY  03/01/2014   two polyps; Dr. Benson Norway.  Repeat 5 years.  [name] GANGLION CYST EXCISION  1980   Left wrist  . JOINT REPLACEMENT  01/2013   right knee, left knee. [facility].  [name] KNEE ARTHROPLASTY    . KNEE ARTHROSCOPY  2008   Right  . lumbar spine epidural  2009   [facility]  . ROTATOR CUFF REPAIR  10/2008   right  . TOTAL KNEE ARTHROPLASTY  12/27/2012   Procedure: TOTAL KNEE ARTHROPLASTY;  Surgeon: [name], MD;  Location: [facility];  Service: Orthopedics;  Laterality: Right;  RIGHT ARTHROPLASTY KNEE MEDIAL AND LATERAL COMPARTMENTS WITH PATELLA RESURFACING, RIGHT TOTAL KNEE REPLACEMENT  . TOTAL KNEE ARTHROPLASTY Left 01/31/2013   Procedure: TOTAL KNEE ARTHROPLASTY;  Surgeon: [name], MD;  Location: [facility];  Service: Orthopedics;  Laterality: Left;  . TOTAL SHOULDER ARTHROPLASTY     No Known Allergies Current Outpatient Medications on File Prior to Visit  Medication Sig Dispense Refill  . atorvastatin (LIPITOR) 10 MG tablet Take 1 tablet (10 mg total) by mouth daily. 90 tablet 3  . Cholecalciferol (VITAMIN D PO) Take by mouth daily.    . colestipol (COLESTID) 1 g tablet Take 2 g by mouth 2 (two) times daily.  5  . Cyanocobalamin (VITAMIN B12 PO) Take by mouth daily.    . diclofenac sodium (VOLTAREN) 1 % GEL APPLY 2 TO 4 GRAMS TO AFFECTED AREA 4 TIMES DAILY 100 g 4  . DULoxetine (CYMBALTA) 60 MG capsule Take 1 capsule (60 mg total) 2 (two) times daily by mouth. 180 capsule 3  . losartan (COZAAR) 25 MG tablet Take 1 tablet (25 mg total) by mouth daily. 90 tablet 3  . metoprolol succinate (TOPROL-XL) 50 MG 24 hr tablet Take 1 tablet (50 mg total) by mouth daily. Take with or immediately following a meal. 90 tablet 3  . omeprazole  (PRILOSEC) 20 MG capsule TAKE 1 CAPSULE BY MOUTH EVERY DAY 90 capsule 3  . rivaroxaban (XARELTO) 20 MG TABS tablet Take 1 tablet (20 mg total) by mouth daily with supper. 30 tablet 5  . traZODone (DESYREL) 50 MG tablet TAKE 1 TO 2 TABLETS BY MOUTH AT BEDTIME AS NEEDED FOR SLEEP 180 tablet 3   No current facility-administered medications on file prior to visit.    Social History   Socioeconomic History  . Marital status: Married    Spouse name: Not on file  . Number of children: 1  . Years of education: Not on file  . Highest education level: Not on file  Occupational History  . Occupation: Armed forces technical officer: [employer]    Comment: Paints cars/trucks  Social Needs  . Financial resource strain: Not on file  . Food insecurity:    Worry: Not on file    Inability: Not on file  . Transportation needs:    Medical: Not on file    Non-medical: Not on file  Tobacco Use  . Smoking status: Former Smoker    Packs/day: 2.00    Years: 30.00    Pack years: 60.00    Types: Cigarettes    Last attempt to quit: 12/28/2008    Years since quitting: 9.5  .  Smokeless tobacco: Never Used  . Tobacco comment: quit 12 /2011  Substance and Sexual Activity  . Alcohol use: Yes    Alcohol/week: 6.0 oz    Types: 6 Cans of beer, 4 Standard drinks or equivalent per week  . Drug use: No  . Sexual activity: Yes    Birth control/protection: Post-menopausal  Lifestyle  . Physical activity:    Days per week: Not on file    Minutes per session: Not on file  . Stress: Not on file  Relationships  . Social connections:    Talks on phone: Not on file    Gets together: Not on file    Attends religious service: Not on file    Active member of club or organization: Not on file    Attends meetings of clubs or organizations: Not on file    Relationship status: Not on file  . Intimate partner violence:    Fear of current or ex partner: Not on file    Emotionally abused: Not on file    Physically  abused: Not on file    Forced sexual activity: Not on file  Other Topics Concern  . Not on file  Social History Narrative   Guns in the home stored in locked cabinet.       Caffeine use: 1 serving/ day.     Marital status:  Married x 25 years, third marriage, happily married.      Children: one son (60); one step-daughter; two grandsons      Lives: with wife. Mother-in-law going to move in in 2017.      Employment:  Works at [employer] on [employer]; [employer] work; happy; 25 hours per week.      Tobacco: none currently; quit 2013.  Smoked x 40 years.      Alcohol:  2-6 beers per night in 2018; much heavier use in fifties.      Drugs: none      Exercise:  None in 2017.      Seatbelt: 100%      ADLs: independent with ADLs in 2017; no assistant devices      Advanced Directives: none in 2017; FULL CODE in 2017; no prolonged measures   Family History  Problem Relation Age of Onset  . Alzheimer's disease Mother   . Osteoporosis Mother   . Heart disease Father 34       AMI  . Alcohol abuse Father   . Cancer Brother        colon cancer  . Diabetes Brother   . Kidney failure Brother        Objective:    BP 102/72   Pulse 88   Temp 98.7 F (37.1 C) (Oral)   Resp 16   Ht 5' 6.93" (1.7 m)   Wt 188 lb (85.3 kg)   SpO2 95%   BMI 29.51 kg/m  Physical Exam  Constitutional: He is oriented to person, place, and time. He appears well-developed and well-nourished. No distress.  HENT:  Head: Normocephalic and atraumatic.  Right Ear: Tympanic membrane, external ear and ear canal normal.  Left Ear: Tympanic membrane, external ear and ear canal normal.  Nose: Mucosal edema and rhinorrhea present. Right sinus exhibits maxillary sinus tenderness. Left sinus exhibits maxillary sinus tenderness.  Mouth/Throat: Uvula is midline, oropharynx is clear and moist and mucous membranes are normal. No tonsillar exudate.  Eyes: Pupils are equal, round, and reactive to light. Conjunctivae and EOM are  normal.  Neck: Normal  range of motion. Neck supple. Carotid bruit is not present. No thyromegaly present.  Cardiovascular: Normal rate, regular rhythm, normal heart sounds and intact distal pulses. Exam reveals no gallop and no friction rub.  No murmur heard. Pulmonary/Chest: Effort normal. No stridor. He has wheezes in the right upper field, the right middle field, the right lower field, the left upper field, the left middle field and the left lower field. He has no rhonchi. He has no rales.  Abdominal: Soft. Bowel sounds are normal. He exhibits no distension and no mass. There is no tenderness. There is no rebound and no guarding.  Musculoskeletal:       Right shoulder: Normal.       Left shoulder: Normal.       Cervical back: Normal.  Lymphadenopathy:    He has no cervical adenopathy.  Neurological: He is alert and oriented to person, place, and time. He has normal reflexes. No cranial nerve deficit. He exhibits normal muscle tone. Coordination normal.  Skin: Skin is warm and dry. No rash noted. He is not diaphoretic.  Psychiatric: He has a normal mood and affect. His behavior is normal. Judgment and thought content normal.   No results found. Depression screen [facility] 2/9 06/16/2018 06/01/2018 05/05/2018 01/18/2018 09/14/2017  Decreased Interest 1 3 0 0 0  Down, Depressed, Hopeless 1 2 0 0 0  PHQ - 2 Score 2 5 0 0 0  Altered sleeping 0 0 - - -  Tired, decreased energy 1 3 - - -  Change in appetite - 3 - - -  Feeling bad or failure about yourself  - 1 - - -  Trouble concentrating - 1 - - -  Moving slowly or fidgety/restless - 3 - - -  Suicidal thoughts - 0 - - -  PHQ-9 Score 3 16 - - -  Difficult doing work/chores - Extremely dIfficult - - -   Fall Risk  06/16/2018 05/05/2018 01/18/2018 09/14/2017 09/07/2017  Falls in the past year? No No No No No  Number falls in past yr: - - - - -  Injury with [place]:   1. Bronchospasm   2. Acute  non-recurrent frontal sinusitis     New onset; s/p Xopenex nebulizer in office. Obtain CXR. Treat with Doxycycline, Prednisone, Tessalon Perles, atrovent nasal spray. RTC for acute worsening.  Orders Placed This Encounter  Procedures  . DG Chest 2 View    Standing Status:   Future    Number of Occurrences:   1    Standing Expiration Date:   05/05/2019    Order Specific Question:   Reason for Exam (SYMPTOM  OR DIAGNOSIS REQUIRED)    Answer:   cough, wheezing, chills; atrial fibrillation    Order Specific Question:   Preferred imaging location?    Answer:   External   Meds ordered this encounter  Medications  . levalbuterol (XOPENEX) nebulizer solution 0.63 mg  . DISCONTD: doxycycline (VIBRAMYCIN) 100 MG capsule    Sig: Take 1 capsule (100 mg total) by mouth 2 (two) times daily.    Dispense:  20 capsule    Refill:  0  . DISCONTD: predniSONE (DELTASONE) 20 MG tablet    Sig: Take 3 PO QAM x 3 days, 2 PO QAM x 3 days, 1 PO QAM x 3 days    Dispense:  18 tablet  Refill:  0  . DISCONTD: benzonatate (TESSALON) 100 MG capsule    Sig: Take 1-2 capsules (100-200 mg total) by mouth 3 (three) times daily as needed for cough.    Dispense:  40 capsule    Refill:  0  . DISCONTD: ipratropium (ATROVENT) 0.03 % nasal spray    Sig: Place 2 sprays into both nostrils 2 (two) times daily.    Dispense:  30 mL    Refill:  0    No follow-ups on file.   Katherina Wimer Elayne Guerin, M.D. Primary Care at [facility] [address] [place], [place]  [address] [phone] phone [phone] fax

## 2018-05-08 ENCOUNTER — Ambulatory Visit: Payer: Medicare Other | Admitting: Family Medicine

## 2018-05-12 ENCOUNTER — Telehealth: Payer: Self-pay | Admitting: Family Medicine

## 2018-05-12 NOTE — Telephone Encounter (Signed)
Copied from [place] [id number]. Topic: Quick Communication - See Telephone Encounter >> May 12, 2018  2:13 PM Robina Ade, Helene Kelp D wrote: CRM for notification. See Telephone encounter for: 05/12/18. Patient called and said that he still isn't feeling well, he still has a bad cough and everything else. He would like a call back from Dr. [name] or her CMA about this. Please call patient back, thanks.

## 2018-05-13 NOTE — Telephone Encounter (Signed)
Spoke with pt - he states his chest feels better but feeling very fatigued.  Has a couple more ABX and Prednisone to take.  Advised/stressed to complete the regimen of both.  Advised to call for appt for follow up if he doesn't feel better by first of next week.  Pt verbalized understanding.

## 2018-05-21 ENCOUNTER — Encounter: Payer: Self-pay | Admitting: Family Medicine

## 2018-05-24 NOTE — Telephone Encounter (Signed)
Please schedule pt a same day appointment with a provider this week for mouth ulcers.

## 2018-06-01 ENCOUNTER — Encounter: Payer: Self-pay | Admitting: Family Medicine

## 2018-06-01 ENCOUNTER — Ambulatory Visit (INDEPENDENT_AMBULATORY_CARE_PROVIDER_SITE_OTHER): Payer: Medicare Other | Admitting: Family Medicine

## 2018-06-01 ENCOUNTER — Other Ambulatory Visit: Payer: Self-pay | Admitting: Family Medicine

## 2018-06-01 VITALS — BP 112/70 | HR 92 | Temp 98.6°F | Resp 16 | Ht 66.93 in | Wt 190.8 lb

## 2018-06-01 DIAGNOSIS — I480 Paroxysmal atrial fibrillation: Secondary | ICD-10-CM | POA: Diagnosis not present

## 2018-06-01 DIAGNOSIS — G933 Postviral fatigue syndrome: Secondary | ICD-10-CM

## 2018-06-01 DIAGNOSIS — M359 Systemic involvement of connective tissue, unspecified: Secondary | ICD-10-CM

## 2018-06-01 DIAGNOSIS — I6523 Occlusion and stenosis of bilateral carotid arteries: Secondary | ICD-10-CM

## 2018-06-01 DIAGNOSIS — G9331 Postviral fatigue syndrome: Secondary | ICD-10-CM

## 2018-06-01 DIAGNOSIS — B370 Candidal stomatitis: Secondary | ICD-10-CM | POA: Diagnosis not present

## 2018-06-01 DIAGNOSIS — J0100 Acute maxillary sinusitis, unspecified: Secondary | ICD-10-CM | POA: Diagnosis not present

## 2018-06-01 MED ORDER — NYSTATIN 100000 UNIT/ML MT SUSP: 5.0000 mL | Freq: Four times a day (QID) | OROMUCOSAL | 0 refills | Status: DC

## 2018-06-01 MED ORDER — AMOXICILLIN-POT CLAVULANATE 875-125 MG PO TABS: 1.0000 | ORAL_TABLET | Freq: Two times a day (BID) | ORAL | 0 refills | Status: DC

## 2018-06-01 NOTE — Patient Instructions (Signed)
     IF you received an x-ray today, you will receive an invoice from [facility]. Please contact [facility] at [phone] with questions or concerns regarding your invoice.   IF you received labwork today, you will receive an invoice from [facility]. Please contact [facility] at [phone] with questions or concerns regarding your invoice.   Our billing staff will not be able to assist you with questions regarding bills from these companies.  You will be contacted with the lab results as soon as they are available. The fastest way to get your results is to activate your My Chart account. Instructions are located on the last page of this paperwork. If you have not heard from us regarding the results in 2 weeks, please contact this office.     

## 2018-06-01 NOTE — Telephone Encounter (Signed)
Atrovent 0.03% nasal spray refill request  Has an appt with Dr. [name] today (7/2) at 4:40.  Refill during OV in case of changes?   This is a f/u visit for this URI symptoms.  [facility] [address] [place], [place]

## 2018-06-01 NOTE — Progress Notes (Signed)
Subjective:    Patient ID: David Weaver, male    DOB: January 13, 1951, 67 y.o.   MRN: [id number]  06/01/2018  Fatigue (for a month now ) and Depression    HPI This 67 y.o. male presents for evaluation of fatigue and depression and mouth ulcers for the past month. Recurrent sputum production and coughing recurred since visit on 05/05/18 and has worsened in the past week.  Some SOB.  Sputum is green.  No fever/chills/sweats.  Fatigue.  +ST developed at same time one week ago.  Still sore. Rhinorrhea and nasal congestion.  Using nasal irrigation.  Using spray.  Using viscous lidocaine witthout improvement.  Mouth ulcer: onset one month ago; thinks is blood pressure medication.  Depressed: worried that depressed; sad because doesn't feel good.  No energy.    BP Readings from Last 3 Encounters:  06/01/18 112/70  05/05/18 102/72  04/28/18 112/70   Wt Readings from Last 3 Encounters:  06/01/18 190 lb 12.8 oz (86.5 kg)  05/05/18 188 lb (85.3 kg)  04/28/18 193 lb (87.5 kg)   Immunization History  Administered Date(s) Administered  . Influenza, Seasonal, Injecte, Preservative Fre 11/01/2012  . Influenza,inj,Quad PF,6+ Mos 11/21/2013, 10/11/2014, 10/03/2015, 09/02/2016, 09/07/2017  . Influenza-Unspecified 08/20/2009, 08/19/2010, 08/12/2011  . Pneumococcal Conjugate-13 02/12/2015  . Pneumococcal Polysaccharide-23 02/19/2016  . Pneumococcal-Unspecified 03/03/2008  . Td 07/01/2001  . Tdap 02/03/2012  . Zoster 08/12/2011    Review of Systems  Constitutional: Positive for fatigue. Negative for activity change, appetite change, chills, diaphoresis and fever.  HENT: Positive for congestion, mouth sores, postnasal drip, rhinorrhea and sore throat. Negative for ear pain, sinus pressure and sinus pain.   Respiratory: Positive for cough. Negative for shortness of breath and wheezing.   Cardiovascular: Negative for chest pain, palpitations and leg swelling.  Gastrointestinal: Negative for abdominal  distention, abdominal pain, anal bleeding, blood in stool, constipation, diarrhea, nausea and vomiting.  Endocrine: Negative for cold intolerance, heat intolerance, polydipsia, polyphagia and polyuria.  Skin: Negative for color change, rash and wound.  Neurological: Negative for dizziness, tremors, seizures, syncope, facial asymmetry, speech difficulty, weakness, light-headedness, numbness and headaches.  Psychiatric/Behavioral: Positive for dysphoric mood. Negative for sleep disturbance. The patient is not nervous/anxious.     Past Medical History:  Diagnosis Date  . Alcohol abuse, daily use   . Allergic rhinitis, cause unspecified   . Dysthymic disorder   . Esophageal reflux   . Essential hypertension, benign   . Fibromyalgia    followed by [name] every six months.  . Fibromyositis   . Impotence of organic origin   . Insomnia, unspecified   . Left knee DJD   . Lupus erythematosus   . Osteoarthrosis, unspecified whether generalized or localized, lower leg   . Other abnormal glucose   . Other specified disorder of male genital organs(608.89)   . Systemic lupus erythematosus ([place])   . Unspecified hearing loss   . Unspecified vitamin D deficiency    Past Surgical History:  Procedure Laterality Date  . CARPAL TUNNEL RELEASE     bilateral  . CHOLECYSTECTOMY    . COLONOSCOPY  03/01/2014   two polyps; Dr. Benson [place].  Repeat 5 years.  [name] GANGLION CYST EXCISION  1980   Left wrist  . JOINT REPLACEMENT  01/2013   right knee, left knee. [facility].  [name] KNEE ARTHROPLASTY    . KNEE ARTHROSCOPY  2008   Right  . lumbar spine epidural  2009   [facility]  . ROTATOR  CUFF REPAIR  10/2008   right  . TOTAL KNEE ARTHROPLASTY  12/27/2012   Procedure: TOTAL KNEE ARTHROPLASTY;  Surgeon: [name], MD;  Location: [facility];  Service: Orthopedics;  Laterality: Right;  RIGHT ARTHROPLASTY KNEE MEDIAL AND LATERAL COMPARTMENTS WITH PATELLA RESURFACING, RIGHT TOTAL KNEE REPLACEMENT  . TOTAL KNEE  ARTHROPLASTY Left 01/31/2013   Procedure: TOTAL KNEE ARTHROPLASTY;  Surgeon: [name], MD;  Location: [facility];  Service: Orthopedics;  Laterality: Left;  . TOTAL SHOULDER ARTHROPLASTY     No Known Allergies Current Outpatient Medications on File Prior to Visit  Medication Sig Dispense Refill  . amLODipine (NORVASC) 10 MG tablet TAKE 1 TABLET BY MOUTH EVERY DAY 90 tablet 3  . atorvastatin (LIPITOR) 10 MG tablet Take 1 tablet (10 mg total) by mouth daily. 90 tablet 3  . benzonatate (TESSALON) 100 MG capsule Take 1-2 capsules (100-200 mg total) by mouth 3 (three) times daily as needed for cough. 40 capsule 0  . Cholecalciferol (VITAMIN D PO) Take by mouth daily.    . colestipol (COLESTID) 1 g tablet Take 2 g by mouth 2 (two) times daily.  5  . Cyanocobalamin (VITAMIN B12 PO) Take by mouth daily.    . diclofenac sodium (VOLTAREN) 1 % GEL APPLY 2 TO 4 GRAMS TO AFFECTED AREA 4 TIMES DAILY 100 g 4  . doxycycline (VIBRAMYCIN) 100 MG capsule Take 1 capsule (100 mg total) by mouth 2 (two) times daily. 20 capsule 0  . DULoxetine (CYMBALTA) 60 MG capsule Take 1 capsule (60 mg total) 2 (two) times daily by mouth. 180 capsule 3  . hydroxychloroquine (PLAQUENIL) 200 MG tablet TAKE 1 TABLET BY MOUTH TWICE A DAY 180 tablet 0  . losartan (COZAAR) 25 MG tablet Take 1 tablet (25 mg total) by mouth daily. 90 tablet 3  . metoprolol succinate (TOPROL-XL) 50 MG 24 hr tablet Take 1 tablet (50 mg total) by mouth daily. Take with or immediately following a meal. 90 tablet 3  . omeprazole (PRILOSEC) 20 MG capsule TAKE 1 CAPSULE BY MOUTH EVERY DAY 90 capsule 3  . oxyCODONE-acetaminophen (PERCOCET/ROXICET) 5-325 MG tablet Take 1 tablet by mouth every 4 (four) hours as needed. for pain  0  . predniSONE (DELTASONE) 20 MG tablet Take 3 PO QAM x 3 days, 2 PO QAM x 3 days, 1 PO QAM x 3 days 18 tablet 0  . rivaroxaban (XARELTO) 20 MG TABS tablet Take 1 tablet (20 mg total) by mouth daily with supper. 30 tablet 5  . traZODone  (DESYREL) 50 MG tablet TAKE 1 TO 2 TABLETS BY MOUTH AT BEDTIME AS NEEDED FOR SLEEP 180 tablet 3   No current facility-administered medications on file prior to visit.    Social History   Socioeconomic History  . Marital status: Married    Spouse name: Not on file  . Number of children: 1  . Years of education: Not on file  . Highest education level: Not on file  Occupational History  . Occupation: Armed forces technical officer: [employer]    Comment: Paints cars/trucks  Social Needs  . Financial resource strain: Not on file  . Food insecurity:    Worry: Not on file    Inability: Not on file  . Transportation needs:    Medical: Not on file    Non-medical: Not on file  Tobacco Use  . Smoking status: Former Smoker    Packs/day: 2.00    Years: 30.00  Pack years: 60.00    Types: Cigarettes    Last attempt to quit: 12/28/2008    Years since quitting: 9.4  . Smokeless tobacco: Never Used  . Tobacco comment: quit 12 /2011  Substance and Sexual Activity  . Alcohol use: Yes    Alcohol/week: 6.0 oz    Types: 6 Cans of beer, 4 Standard drinks or equivalent per week  . Drug use: No  . Sexual activity: Yes    Birth control/protection: Post-menopausal  Lifestyle  . Physical activity:    Days per week: Not on file    Minutes per session: Not on file  . Stress: Not on file  Relationships  . Social connections:    Talks on phone: Not on file    Gets together: Not on file    Attends religious service: Not on file    Active member of club or organization: Not on file    Attends meetings of clubs or organizations: Not on file    Relationship status: Not on file  . Intimate partner violence:    Fear of current or ex partner: Not on file    Emotionally abused: Not on file    Physically abused: Not on file    Forced sexual activity: Not on file  Other Topics Concern  . Not on file  Social History Narrative   Guns in the home stored in locked cabinet.       Caffeine use: 1  serving/ day.     Marital status:  Married x 25 years, third marriage, happily married.      Children: one son (37); one step-daughter; two grandsons      Lives: with wife. Mother-in-law going to move in in 2017.      Employment:  Works at [employer] on [employer]; [employer] work; happy; 25 hours per week.      Tobacco: none currently; quit 2013.  Smoked x 40 years.      Alcohol:  2-6 beers per night in 2018; much heavier use in fifties.      Drugs: none      Exercise:  None in 2017.      Seatbelt: 100%      ADLs: independent with ADLs in 2017; no assistant devices      Advanced Directives: none in 2017; FULL CODE in 2017; no prolonged measures   Family History  Problem Relation Age of Onset  . Alzheimer's disease Mother   . Osteoporosis Mother   . Heart disease Father 64       AMI  . Alcohol abuse Father   . Cancer Brother        colon cancer  . Diabetes Brother   . Kidney failure Brother        Objective:    BP 112/70   Pulse 92   Temp 98.6 F (37 C)   Resp 16   Ht 5' 6.93" (1.7 m)   Wt 190 lb 12.8 oz (86.5 kg)   SpO2 95%   BMI 29.95 kg/m  Physical Exam  Constitutional: He is oriented to person, place, and time. He appears well-developed and well-nourished. No distress.  HENT:  Head: Normocephalic and atraumatic.  Right Ear: External ear normal.  Left Ear: External ear normal.  Nose: Nose normal.  Mouth/Throat: Oropharynx is clear and moist.  Eyes: Pupils are equal, round, and reactive to light. Conjunctivae and EOM are normal.  Neck: Normal range of motion. Neck supple. Carotid bruit is not present. No  thyromegaly present.  Cardiovascular: Normal rate, regular rhythm, normal heart sounds and intact distal pulses. Exam reveals no gallop and no friction rub.  No murmur heard. Pulmonary/Chest: Effort normal. No stridor. No respiratory distress. He has no decreased breath sounds. He has wheezes in the left upper field. He has no rhonchi. He has no rales.  Abdominal: Soft.  Bowel sounds are normal. He exhibits no distension and no mass. There is no tenderness. There is no rebound and no guarding.  Musculoskeletal:       Right shoulder: Normal.       Left shoulder: Normal.       Cervical back: Normal.  Lymphadenopathy:    He has no cervical adenopathy.  Neurological: He is alert and oriented to person, place, and time. He has normal reflexes. No cranial nerve deficit. He exhibits normal muscle tone. Coordination normal.  Skin: Skin is warm and dry. No rash noted. He is not diaphoretic.  Psychiatric: He has a normal mood and affect. His behavior is normal. Judgment and thought content normal.   No results found. Depression screen [facility] 2/9 06/01/2018 05/05/2018 01/18/2018 09/14/2017 09/07/2017  Decreased Interest 3 0 0 0 0  Down, Depressed, Hopeless 2 0 0 0 0  PHQ - 2 Score 5 0 0 0 0  Altered sleeping 0 - - - 0  Tired, decreased energy 3 - - - 3  Change in appetite 3 - - - 0  Feeling bad or failure about yourself  1 - - - 0  Trouble concentrating 1 - - - 2  Moving slowly or fidgety/restless 3 - - - 0  Suicidal thoughts 0 - - - 0  PHQ-9 Score 16 - - - 5  Difficult doing work/chores Extremely dIfficult - - - Not difficult at all   Fall Risk  05/05/2018 01/18/2018 09/14/2017 09/07/2017 05/15/2017  Falls in the past year? No No No No No  Number falls in past yr: - - - - -  Injury with [place]:   1. Acute non-recurrent maxillary sinusitis   2. Thrush   3. Postviral fatigue syndrome   4. Paroxysmal atrial fibrillation ([name])     Acute sinusitis: Treat with Augmentin therapy.  Lung exam much improved from last visit with resolution 95% of bronchospasm.  Continue nasal irrigation at home.  Thrush: New onset.  Feels etiology to recent mouth lesions.  Treat with nystatin swish and swallow for 1 week.  Post viral fatigue syndrome: With recurrent illness.  Obtain labs to rule out secondary etiology.  Anticipate need  to improve his fatigue improves.  Close follow-up in the next 3 weeks for confirmation of improvement.  Orders Placed This Encounter  Procedures  . CBC with Differential/Platelet  . Comprehensive metabolic panel  . TSH   Meds ordered this encounter  Medications  . nystatin (MYCOSTATIN) 100000 UNIT/ML suspension    Sig: Take 5 mLs (500,000 Units total) by mouth 4 (four) times daily.    Dispense:  200 mL    Refill:  0  . amoxicillin-clavulanate (AUGMENTIN) 875-125 MG tablet    Sig: Take 1 tablet by mouth 2 (two) times daily.    Dispense:  14 tablet    Refill:  0    No follow-ups on file.   Laelynn Blizzard Elayne Guerin, M.D. Primary Care at [facility]  [address] [place], [place]  [address] [phone] phone [phone] fax

## 2018-06-02 LAB — CBC WITH DIFFERENTIAL/PLATELET
BASOS ABS: 0 10*3/uL (ref 0.0–0.2)
Basos: 1 %
EOS (ABSOLUTE): 0.4 10*3/uL (ref 0.0–0.4)
Eos: 7 %
Hematocrit: 45 % (ref 37.5–51.0)
Hemoglobin: 15.7 g/dL (ref 13.0–17.7)
Immature Grans (Abs): 0 10*3/uL (ref 0.0–0.1)
Immature Granulocytes: 1 %
LYMPHS ABS: 1.3 10*3/uL (ref 0.7–3.1)
Lymphs: 21 %
MCH: 29.8 pg (ref 26.6–33.0)
MCHC: 34.9 g/dL (ref 31.5–35.7)
MCV: 85 fL (ref 79–97)
Monocytes Absolute: 0.7 10*3/uL (ref 0.1–0.9)
Monocytes: 11 %
NEUTROS ABS: 3.9 10*3/uL (ref 1.4–7.0)
Neutrophils: 59 %
PLATELETS: 210 10*3/uL (ref 150–450)
RBC: 5.27 x10E6/uL (ref 4.14–5.80)
RDW: 14.5 % (ref 12.3–15.4)
WBC: 6.5 10*3/uL (ref 3.4–10.8)

## 2018-06-02 LAB — COMPREHENSIVE METABOLIC PANEL
ALK PHOS: 110 IU/L (ref 39–117)
ALT: 61 IU/L — AB (ref 0–44)
AST: 28 IU/L (ref 0–40)
Albumin/Globulin Ratio: 1.9 (ref 1.2–2.2)
Albumin: 4.6 g/dL (ref 3.6–4.8)
BILIRUBIN TOTAL: 0.3 mg/dL (ref 0.0–1.2)
BUN/Creatinine Ratio: 18 (ref 10–24)
BUN: 12 mg/dL (ref 8–27)
CHLORIDE: 105 mmol/L (ref 96–106)
CO2: 20 mmol/L (ref 20–29)
Calcium: 9.2 mg/dL (ref 8.6–10.2)
Creatinine, Ser: 0.68 mg/dL — ABNORMAL LOW (ref 0.76–1.27)
GFR calc Af Amer: 113 mL/min/{1.73_m2} (ref >59)
GFR calc non Af Amer: 97 mL/min/{1.73_m2} (ref >59)
GLUCOSE: 123 mg/dL — AB (ref 65–99)
Globulin, Total: 2.4 g/dL (ref 1.5–4.5)
Potassium: 4.2 mmol/L (ref 3.5–5.2)
Sodium: 144 mmol/L (ref 134–144)
Total Protein: 7 g/dL (ref 6.0–8.5)

## 2018-06-02 LAB — TSH: TSH: 3.6 u[IU]/mL (ref 0.450–4.500)

## 2018-06-05 NOTE — Telephone Encounter (Signed)
Pt saw Dr. [name] 7/2

## 2018-06-06 DIAGNOSIS — M359 Systemic involvement of connective tissue, unspecified: Secondary | ICD-10-CM | POA: Insufficient documentation

## 2018-06-10 DIAGNOSIS — M47812 Spondylosis without myelopathy or radiculopathy, cervical region: Secondary | ICD-10-CM | POA: Diagnosis not present

## 2018-06-10 DIAGNOSIS — M47816 Spondylosis without myelopathy or radiculopathy, lumbar region: Secondary | ICD-10-CM | POA: Diagnosis not present

## 2018-06-10 DIAGNOSIS — Z79891 Long term (current) use of opiate analgesic: Secondary | ICD-10-CM | POA: Diagnosis not present

## 2018-06-10 DIAGNOSIS — M150 Primary generalized (osteo)arthritis: Secondary | ICD-10-CM | POA: Diagnosis not present

## 2018-06-16 ENCOUNTER — Other Ambulatory Visit: Payer: Self-pay

## 2018-06-16 ENCOUNTER — Ambulatory Visit (INDEPENDENT_AMBULATORY_CARE_PROVIDER_SITE_OTHER): Payer: Medicare Other | Admitting: Family Medicine

## 2018-06-16 ENCOUNTER — Encounter: Payer: Self-pay | Admitting: Family Medicine

## 2018-06-16 VITALS — BP 110/68 | HR 84 | Temp 98.0°F | Resp 16 | Ht 67.32 in | Wt 192.0 lb

## 2018-06-16 DIAGNOSIS — I10 Essential (primary) hypertension: Secondary | ICD-10-CM | POA: Diagnosis not present

## 2018-06-16 DIAGNOSIS — G894 Chronic pain syndrome: Secondary | ICD-10-CM | POA: Diagnosis not present

## 2018-06-16 DIAGNOSIS — M5136 Other intervertebral disc degeneration, lumbar region: Secondary | ICD-10-CM

## 2018-06-16 DIAGNOSIS — I480 Paroxysmal atrial fibrillation: Secondary | ICD-10-CM | POA: Diagnosis not present

## 2018-06-16 DIAGNOSIS — E559 Vitamin D deficiency, unspecified: Secondary | ICD-10-CM | POA: Diagnosis not present

## 2018-06-16 DIAGNOSIS — R7309 Other abnormal glucose: Secondary | ICD-10-CM | POA: Diagnosis not present

## 2018-06-16 DIAGNOSIS — M359 Systemic involvement of connective tissue, unspecified: Secondary | ICD-10-CM

## 2018-06-16 DIAGNOSIS — M19042 Primary osteoarthritis, left hand: Secondary | ICD-10-CM | POA: Diagnosis not present

## 2018-06-16 DIAGNOSIS — F3341 Major depressive disorder, recurrent, in partial remission: Secondary | ICD-10-CM | POA: Diagnosis not present

## 2018-06-16 DIAGNOSIS — D8989 Other specified disorders involving the immune mechanism, not elsewhere classified: Secondary | ICD-10-CM

## 2018-06-16 DIAGNOSIS — R5383 Other fatigue: Secondary | ICD-10-CM | POA: Diagnosis not present

## 2018-06-16 DIAGNOSIS — M797 Fibromyalgia: Secondary | ICD-10-CM

## 2018-06-16 DIAGNOSIS — M19041 Primary osteoarthritis, right hand: Secondary | ICD-10-CM | POA: Diagnosis not present

## 2018-06-16 DIAGNOSIS — I6523 Occlusion and stenosis of bilateral carotid arteries: Secondary | ICD-10-CM | POA: Diagnosis not present

## 2018-06-16 MED ORDER — AMLODIPINE BESYLATE 5 MG PO TABS
5.0000 mg | ORAL_TABLET | Freq: Every day | ORAL | 1 refills | Status: DC
Start: 2018-06-16 — End: 2020-04-06

## 2018-06-16 NOTE — Progress Notes (Signed)
Subjective:    Patient ID: David Weaver, male    DOB: 1951/11/18, 67 y.o.   MRN: [id number]  06/16/2018  Acute non-recurrent maxillary sinusitis (follow-up from OV on 7/2 pt states he feelings better with no symptoms ) and Fatigue (pt states he still feels a little fatigue still but much better from last OV )    HPI This 67 y.o. male presents for six-month follow-up for hypertension, hypercholesterolemia, glucose intolerance, atrial fibrillation, chronic pain syndrome due to osteoarthritis of knees and degenerative disc disease of cervical and lumbar spines.  Management changes made at visit in June 01, 2018 include the following: Acute sinusitis: Treat with Augmentin therapy.  Lung exam much improved from last visit with resolution 95% of bronchospasm.  Continue nasal irrigation at home. Thrush: New onset.  Feels etiology to recent mouth lesions.  Treat with nystatin swish and swallow for 1 week. Post viral fatigue syndrome: With recurrent illness.  Obtain labs to rule out secondary etiology.  Anticipate need to improve his fatigue improves.  Close follow-up in the next 3 weeks for confirmation of improvement  Update since last visit: Sinus congestion better; still lacking motivation.  Lack of interest.  Lacking enjoyment.   Not going to the lake much; too hot.  Might go next week. No sadness.  No excessive worry.  Always worry about kids yet even worry has improved. Sleeping well.  Checking blood pressure at home and running 110s to 115 over 60s to 65.  Denies chest pain, palpitations, shortness of breath, leg swelling.  Will have intermittent dyspnea on exertion and not exercising regularly at this time.  Has not returned to the gym due to lack of motivation and recent illness. Patient expresses concern about testosterone level.  He also questions if Lipitor is causing fatigue.  BP Readings from Last 3 Encounters:  06/16/18 110/68  06/01/18 112/70  05/05/18 102/72   Wt Readings from  Last 3 Encounters:  06/16/18 192 lb (87.1 kg)  06/01/18 190 lb 12.8 oz (86.5 kg)  05/05/18 188 lb (85.3 kg)   Immunization History  Administered Date(s) Administered  . Influenza, Seasonal, Injecte, Preservative Fre 11/01/2012  . Influenza,inj,Quad PF,6+ Mos 11/21/2013, 10/11/2014, 10/03/2015, 09/02/2016, 09/07/2017  . Influenza-Unspecified 08/20/2009, 08/19/2010, 08/12/2011  . Pneumococcal Conjugate-13 02/12/2015  . Pneumococcal Polysaccharide-23 02/19/2016  . Pneumococcal-Unspecified 03/03/2008  . Td 07/01/2001  . Tdap 02/03/2012  . Zoster 08/12/2011    Review of Systems  Constitutional: Positive for fatigue. Negative for activity change, appetite change, chills, diaphoresis, fever and unexpected weight change.  HENT: Negative for congestion, dental problem, drooling, ear discharge, ear pain, facial swelling, hearing loss, mouth sores, nosebleeds, postnasal drip, rhinorrhea, sinus pressure, sneezing, sore throat, tinnitus, trouble swallowing and voice change.   Eyes: Negative for photophobia, pain, discharge, redness, itching and visual disturbance.  Respiratory: Negative for apnea, cough, choking, chest tightness, shortness of breath, wheezing and stridor.   Cardiovascular: Negative for chest pain, palpitations and leg swelling.  Gastrointestinal: Negative for abdominal distention, abdominal pain, anal bleeding, blood in stool, constipation, diarrhea, nausea and vomiting.  Endocrine: Negative for cold intolerance, heat intolerance, polydipsia, polyphagia and polyuria.  Genitourinary: Negative for decreased urine volume, difficulty urinating, discharge, dysuria, enuresis, flank pain, frequency, genital sores, hematuria, penile pain, penile swelling, scrotal swelling, testicular pain and urgency.  Musculoskeletal: Negative for arthralgias, back pain, gait problem, joint swelling, myalgias, neck pain and neck stiffness.  Skin: Negative for color change, pallor, rash and wound.    Allergic/Immunologic: Negative for  environmental allergies, food allergies and immunocompromised state.  Neurological: Negative for dizziness, tremors, seizures, syncope, facial asymmetry, speech difficulty, weakness, light-headedness, numbness and headaches.  Hematological: Negative for adenopathy. Does not bruise/bleed easily.  Psychiatric/Behavioral: Positive for dysphoric mood. Negative for agitation, behavioral problems, confusion, decreased concentration, hallucinations, self-injury, sleep disturbance and suicidal ideas. The patient is not nervous/anxious and is not hyperactive.     Past Medical History:  Diagnosis Date  . Alcohol abuse, daily use   . Allergic rhinitis, cause unspecified   . Dysthymic disorder   . Esophageal reflux   . Essential hypertension, benign   . Fibromyalgia    followed by [name] every six months.  . Fibromyositis   . Impotence of organic origin   . Insomnia, unspecified   . Left knee DJD   . Lupus erythematosus   . Osteoarthrosis, unspecified whether generalized or localized, lower leg   . Other abnormal glucose   . Other specified disorder of male genital organs(608.89)   . Systemic lupus erythematosus ([place])   . Unspecified hearing loss   . Unspecified vitamin D deficiency    Past Surgical History:  Procedure Laterality Date  . CARPAL TUNNEL RELEASE     bilateral  . CHOLECYSTECTOMY    . COLONOSCOPY  03/01/2014   two polyps; Dr. Benson [place].  Repeat 5 years.  [name] GANGLION CYST EXCISION  1980   Left wrist  . JOINT REPLACEMENT  01/2013   right knee, left knee. [facility].  [name] KNEE ARTHROPLASTY    . KNEE ARTHROSCOPY  2008   Right  . lumbar spine epidural  2009   [facility]  . ROTATOR CUFF REPAIR  10/2008   right  . TOTAL KNEE ARTHROPLASTY  12/27/2012   Procedure: TOTAL KNEE ARTHROPLASTY;  Surgeon: [name], MD;  Location: [facility];  Service: Orthopedics;  Laterality: Right;  RIGHT ARTHROPLASTY KNEE MEDIAL AND LATERAL COMPARTMENTS WITH  PATELLA RESURFACING, RIGHT TOTAL KNEE REPLACEMENT  . TOTAL KNEE ARTHROPLASTY Left 01/31/2013   Procedure: TOTAL KNEE ARTHROPLASTY;  Surgeon: [name], MD;  Location: [facility];  Service: Orthopedics;  Laterality: Left;  . TOTAL SHOULDER ARTHROPLASTY     No Known Allergies Current Outpatient Medications on File Prior to Visit  Medication Sig Dispense Refill  . atorvastatin (LIPITOR) 10 MG tablet Take 1 tablet (10 mg total) by mouth daily. 90 tablet 3  . Cholecalciferol (VITAMIN D PO) Take by mouth daily.    . colestipol (COLESTID) 1 g tablet Take 2 g by mouth 2 (two) times daily.  5  . Cyanocobalamin (VITAMIN B12 PO) Take by mouth daily.    . diclofenac sodium (VOLTAREN) 1 % GEL APPLY 2 TO 4 GRAMS TO AFFECTED AREA 4 TIMES DAILY 100 g 4  . DULoxetine (CYMBALTA) 60 MG capsule Take 1 capsule (60 mg total) 2 (two) times daily by mouth. 180 capsule 3  . ipratropium (ATROVENT) 0.03 % nasal spray PLACE 2 SPRAYS INTO BOTH NOSTRILS 2 (TWO) TIMES DAILY. 30 mL 0  . losartan (COZAAR) 25 MG tablet Take 1 tablet (25 mg total) by mouth daily. 90 tablet 3  . metoprolol succinate (TOPROL-XL) 50 MG 24 hr tablet Take 1 tablet (50 mg total) by mouth daily. Take with or immediately following a meal. 90 tablet 3  . nystatin (MYCOSTATIN) 100000 UNIT/ML suspension Take 5 mLs (500,000 Units total) by mouth 4 (four) times daily. 200 mL 0  . omeprazole (PRILOSEC) 20 MG capsule TAKE 1 CAPSULE BY MOUTH EVERY DAY 90 capsule  3  . rivaroxaban (XARELTO) 20 MG TABS tablet Take 1 tablet (20 mg total) by mouth daily with supper. 30 tablet 5  . traZODone (DESYREL) 50 MG tablet TAKE 1 TO 2 TABLETS BY MOUTH AT BEDTIME AS NEEDED FOR SLEEP 180 tablet 3   No current facility-administered medications on file prior to visit.    Social History   Socioeconomic History  . Marital status: Married    Spouse name: Not on file  . Number of children: 1  . Years of education: Not on file  . Highest education level: Not on file    Occupational History  . Occupation: Armed forces technical officer: [employer]    Comment: Paints cars/trucks  Social Needs  . Financial resource strain: Not on file  . Food insecurity:    Worry: Not on file    Inability: Not on file  . Transportation needs:    Medical: Not on file    Non-medical: Not on file  Tobacco Use  . Smoking status: Former Smoker    Packs/day: 2.00    Years: 30.00    Pack years: 60.00    Types: Cigarettes    Last attempt to quit: 12/28/2008    Years since quitting: 9.4  . Smokeless tobacco: Never Used  . Tobacco comment: quit 12 /2011  Substance and Sexual Activity  . Alcohol use: Yes    Alcohol/week: 6.0 oz    Types: 6 Cans of beer, 4 Standard drinks or equivalent per week  . Drug use: No  . Sexual activity: Yes    Birth control/protection: Post-menopausal  Lifestyle  . Physical activity:    Days per week: Not on file    Minutes per session: Not on file  . Stress: Not on file  Relationships  . Social connections:    Talks on phone: Not on file    Gets together: Not on file    Attends religious service: Not on file    Active member of club or organization: Not on file    Attends meetings of clubs or organizations: Not on file    Relationship status: Not on file  . Intimate partner violence:    Fear of current or ex partner: Not on file    Emotionally abused: Not on file    Physically abused: Not on file    Forced sexual activity: Not on file  Other Topics Concern  . Not on file  Social History Narrative   Guns in the home stored in locked cabinet.       Caffeine use: 1 serving/ day.     Marital status:  Married x 25 years, third marriage, happily married.      Children: one son (62); one step-daughter; two grandsons      Lives: with wife. Mother-in-law going to move in in 2017.      Employment:  Works at [employer] on [employer]; [employer] work; happy; 25 hours per week.      Tobacco: none currently; quit 2013.  Smoked x 40 years.      Alcohol:   2-6 beers per night in 2018; much heavier use in fifties.      Drugs: none      Exercise:  None in 2017.      Seatbelt: 100%      ADLs: independent with ADLs in 2017; no assistant devices      Advanced Directives: none in 2017; FULL CODE in 2017; no prolonged measures   Family  History  Problem Relation Age of Onset  . Alzheimer's disease Mother   . Osteoporosis Mother   . Heart disease Father 84       AMI  . Alcohol abuse Father   . Cancer Brother        colon cancer  . Diabetes Brother   . Kidney failure Brother        Objective:    BP 110/68   Pulse 84   Temp 98 F (36.7 C) (Oral)   Resp 16   Ht 5' 7.32" (1.71 m)   Wt 192 lb (87.1 kg)   SpO2 95%   BMI 29.78 kg/m  Physical Exam  Constitutional: He is oriented to person, place, and time. He appears well-developed and well-nourished. No distress.  HENT:  Head: Normocephalic and atraumatic.  Right Ear: External ear normal.  Left Ear: External ear normal.  Nose: Nose normal.  Mouth/Throat: Oropharynx is clear and moist.  Eyes: Pupils are equal, round, and reactive to light. Conjunctivae and EOM are normal.  Neck: Normal range of motion. Neck supple. Carotid bruit is not present. No thyromegaly present.  Cardiovascular: Normal rate, regular rhythm, normal heart sounds and intact distal pulses. Exam reveals no gallop and no friction rub.  No murmur heard. Pulmonary/Chest: Effort normal and breath sounds normal. He has no wheezes. He has no rales.  Abdominal: Soft. Bowel sounds are normal. He exhibits no distension and no mass. There is no tenderness. There is no rebound and no guarding.  Musculoskeletal:       Right shoulder: Normal.       Left shoulder: Normal.       Cervical back: Normal.  Lymphadenopathy:    He has no cervical adenopathy.  Neurological: He is alert and oriented to person, place, and time. He has normal reflexes. No cranial nerve deficit. He exhibits normal muscle tone. Coordination normal.  Skin:  Skin is warm and dry. No rash noted. He is not diaphoretic.  Psychiatric: He has a normal mood and affect. His behavior is normal. Judgment and thought content normal.  Nursing note and vitals reviewed.  No results found. Depression screen [facility] 2/9 06/16/2018 06/01/2018 05/05/2018 01/18/2018 09/14/2017  Decreased Interest 1 3 0 0 0  Down, Depressed, Hopeless 1 2 0 0 0  PHQ - 2 Score 2 5 0 0 0  Altered sleeping 0 0 - - -  Tired, decreased energy 1 3 - - -  Change in appetite - 3 - - -  Feeling bad or failure about yourself  - 1 - - -  Trouble concentrating - 1 - - -  Moving slowly or fidgety/restless - 3 - - -  Suicidal thoughts - 0 - - -  PHQ-9 Score 3 16 - - -  Difficult doing work/chores - Extremely dIfficult - - -   Fall Risk  06/16/2018 05/05/2018 01/18/2018 09/14/2017 09/07/2017  Falls in the past year? No No No No No  Number falls in past yr: - - - - -  Injury with [place]:   1. Other fatigue   2. Vitamin D deficiency   3. Paroxysmal atrial fibrillation ([name])   4. Essential hypertension, benign   5. Primary osteoarthritis of both hands   6. DDD (degenerative disc disease), lumbar   7. Other abnormal glucose   8. Fibromyalgia   9. Recurrent major depressive  disorder, in partial remission ([place])   10. Chronic pain syndrome   11. Autoimmune disease ([place])     Fatigue persistent despite history treatment of acute sinusitis.  Obtain testosterone level and vitamin D levels.  All other recent labs within normal limits.  Patient with borderline low blood pressure readings recently and curious if fatigue is due to low blood pressure readings decrease amlodipine to 5 mg daily.  Encourage increased exercise.  Paroxysmal atrial fibrillation: Stable at this time.  In normal sinus rhythm today.  Continue metoprolol and Eliquis therapy.  Managed by cardiology.  Major depression: Stable at this time.  Lacking motivation and interest in normal  activities suggesting some mild depression contributing to fatigue.  Continue Cymbalta therapy at current dose.  Encouraged regular exercise for stress management and depression management.  Consider adjustments to antidepressants if depressive symptoms increase.  Fibromyalgia and lupus: Followed closely by rheumatology.  Hypercholesterolemia: Controlled on atorvastatin therapy.  If fatigue worsens, consider holding atorvastatin therapy to see effect on energy level.  Orders Placed This Encounter  Procedures  . Testosterone  . VITAMIN D 25 Hydroxy (Vit-D Deficiency, Fractures)   Meds ordered this encounter  Medications  . amLODipine (NORVASC) 5 MG tablet    Sig: Take 1 tablet (5 mg total) by mouth daily.    Dispense:  90 tablet    Refill:  1    Return in about 3 months (around 09/16/2018) for complete physical examiniation [place].   Ulisses Vondrak Elayne Guerin, M.D. Primary Care at [facility] [address] [place], [place]  [address] [phone] phone [phone] fax

## 2018-06-16 NOTE — Patient Instructions (Addendum)
   DECREASE AMLODIPINE TO 5MG  DAILY; (HALF THE 10MG  TABLET).  IF you received an x-ray today, you will receive an invoice from [facility]. Please contact [facility] at [phone] with questions or concerns regarding your invoice.   IF you received labwork today, you will receive an invoice from [facility]. Please contact [facility] at [phone] with questions or concerns regarding your invoice.   Our billing staff will not be able to assist you with questions regarding bills from these companies.  You will be contacted with the lab results as soon as they are available. The fastest way to get your results is to activate your My Chart account. Instructions are located on the last page of this paperwork. If you have not heard from [place] regarding the results in 2 weeks, please contact this office.

## 2018-06-17 ENCOUNTER — Other Ambulatory Visit: Payer: Self-pay | Admitting: Rheumatology

## 2018-06-17 ENCOUNTER — Other Ambulatory Visit: Payer: Self-pay | Admitting: Family Medicine

## 2018-06-17 DIAGNOSIS — E291 Testicular hypofunction: Secondary | ICD-10-CM

## 2018-06-17 LAB — TESTOSTERONE: Testosterone: 235 ng/dL — ABNORMAL LOW (ref 264–916)

## 2018-06-17 LAB — VITAMIN D 25 HYDROXY (VIT D DEFICIENCY, FRACTURES): Vit D, 25-Hydroxy: 28.5 ng/mL — ABNORMAL LOW (ref 30.0–100.0)

## 2018-06-17 NOTE — Telephone Encounter (Signed)
Last Visit: 04/13/18 Next Visit: 09/16/18 Labs: 04/13/18 stable PLQ Eye Exam: 03/04/17 WNL   Patient has update PLQ eye exam and will have the results sent to our office  Okay to refill per Dr. Estanislado Pandy

## 2018-06-23 ENCOUNTER — Ambulatory Visit: Payer: Medicare Other

## 2018-06-23 DIAGNOSIS — E291 Testicular hypofunction: Secondary | ICD-10-CM

## 2018-06-24 LAB — TESTOSTERONE: Testosterone: 246 ng/dL — ABNORMAL LOW (ref 264–916)

## 2018-06-24 LAB — FSH/LH
FSH: 11.2 m[IU]/mL (ref 1.5–12.4)
LH: 4.4 m[IU]/mL (ref 1.7–8.6)

## 2018-06-24 LAB — PROLACTIN: Prolactin: 5.3 ng/mL (ref 4.0–15.2)

## 2018-07-03 ENCOUNTER — Other Ambulatory Visit: Payer: Self-pay | Admitting: Family Medicine

## 2018-07-03 ENCOUNTER — Encounter: Payer: Self-pay | Admitting: Family Medicine

## 2018-07-03 DIAGNOSIS — E291 Testicular hypofunction: Secondary | ICD-10-CM

## 2018-07-03 DIAGNOSIS — Z125 Encounter for screening for malignant neoplasm of prostate: Secondary | ICD-10-CM

## 2018-07-05 ENCOUNTER — Encounter: Payer: Self-pay | Admitting: *Deleted

## 2018-07-09 ENCOUNTER — Ambulatory Visit (INDEPENDENT_AMBULATORY_CARE_PROVIDER_SITE_OTHER): Payer: Medicare Other | Admitting: Family Medicine

## 2018-07-09 DIAGNOSIS — E291 Testicular hypofunction: Secondary | ICD-10-CM

## 2018-07-10 LAB — TRANSFERRIN: TRANSFERRIN: 276 mg/dL (ref 200–370)

## 2018-07-10 LAB — T4, FREE: Free T4: 1.19 ng/dL (ref 0.82–1.77)

## 2018-07-10 LAB — TESTOSTERONE: Testosterone: 241 ng/dL — ABNORMAL LOW (ref 264–916)

## 2018-07-10 LAB — CORTISOL-AM, BLOOD: Cortisol - AM: 17.4 ug/dL (ref 6.2–19.4)

## 2018-07-10 LAB — IRON: Iron: 66 ug/dL (ref 38–169)

## 2018-07-22 DIAGNOSIS — M47812 Spondylosis without myelopathy or radiculopathy, cervical region: Secondary | ICD-10-CM | POA: Diagnosis not present

## 2018-07-22 DIAGNOSIS — M150 Primary generalized (osteo)arthritis: Secondary | ICD-10-CM | POA: Diagnosis not present

## 2018-07-22 DIAGNOSIS — Z23 Encounter for immunization: Secondary | ICD-10-CM | POA: Diagnosis not present

## 2018-07-22 DIAGNOSIS — Z79891 Long term (current) use of opiate analgesic: Secondary | ICD-10-CM | POA: Diagnosis not present

## 2018-07-22 DIAGNOSIS — M47816 Spondylosis without myelopathy or radiculopathy, lumbar region: Secondary | ICD-10-CM | POA: Diagnosis not present

## 2018-08-04 ENCOUNTER — Other Ambulatory Visit: Payer: Self-pay

## 2018-08-04 ENCOUNTER — Ambulatory Visit (INDEPENDENT_AMBULATORY_CARE_PROVIDER_SITE_OTHER): Payer: Medicare Other | Admitting: Emergency Medicine

## 2018-08-04 ENCOUNTER — Encounter: Payer: Self-pay | Admitting: Emergency Medicine

## 2018-08-04 VITALS — BP 143/94 | HR 88 | Temp 98.6°F | Resp 16 | Ht 65.0 in | Wt 192.8 lb

## 2018-08-04 DIAGNOSIS — D229 Melanocytic nevi, unspecified: Secondary | ICD-10-CM | POA: Diagnosis not present

## 2018-08-04 DIAGNOSIS — R61 Generalized hyperhidrosis: Secondary | ICD-10-CM | POA: Insufficient documentation

## 2018-08-04 DIAGNOSIS — R110 Nausea: Secondary | ICD-10-CM | POA: Diagnosis not present

## 2018-08-04 DIAGNOSIS — L309 Dermatitis, unspecified: Secondary | ICD-10-CM | POA: Diagnosis not present

## 2018-08-04 DIAGNOSIS — I6523 Occlusion and stenosis of bilateral carotid arteries: Secondary | ICD-10-CM

## 2018-08-04 DIAGNOSIS — L570 Actinic keratosis: Secondary | ICD-10-CM | POA: Diagnosis not present

## 2018-08-04 MED ORDER — ONDANSETRON HCL 4 MG PO TABS: 4.0000 mg | ORAL_TABLET | Freq: Three times a day (TID) | ORAL | 0 refills | Status: DC | PRN

## 2018-08-04 NOTE — Progress Notes (Signed)
David Weaver 67 y.o.   Chief Complaint  Patient presents with  . Excessive Sweating    WITH NAUSEA x 1 month    HISTORY OF PRESENT ILLNESS: This is a 67 y.o. male with multiple medical problems including autoimmune disease lupus and fibromyalgia.  First visit with me.  Saw her rheumatologist last May 2019, scheduled again for next month.  Has a history of PAF which is stable and asymptomatic.  Has a history of GERD, takes omeprazole daily.  Has chronic diarrhea.  Has history of osteoarthritis.  Today complaining of excessive sweating for the past 1 to 2 months.  Also complaining of nausea but he states "I am always nauseous".  Denies any chest pain or difficulty breathing.  Review of recent blood work shows low testosterone.  Otherwise unremarkable labs.  Drinks beer every day.  Non-smoker.  HPI   Prior to Admission medications   Medication Sig Start Date End Date Taking? Authorizing Provider  amLODipine (NORVASC) 5 MG tablet Take 1 tablet (5 mg total) by mouth daily. 06/16/18  Yes Wardell Honour, MD  atorvastatin (LIPITOR) 10 MG tablet Take 1 tablet (10 mg total) by mouth daily. 02/16/18  Yes Wardell Honour, MD  diclofenac sodium (VOLTAREN) 1 % GEL APPLY 2 TO 4 GRAMS TO AFFECTED AREA 4 TIMES DAILY 05/24/17  Yes Wardell Honour, MD  DULoxetine (CYMBALTA) 60 MG capsule Take 1 capsule (60 mg total) 2 (two) times daily by mouth. 10/13/17  Yes Wardell Honour, MD  hydroxychloroquine (PLAQUENIL) 200 MG tablet TAKE 1 TABLET BY MOUTH TWICE A DAY 06/17/18  Yes Deveshwar, Abel Presto, MD  ipratropium (ATROVENT) 0.03 % nasal spray PLACE 2 SPRAYS INTO BOTH NOSTRILS 2 (TWO) TIMES DAILY. 06/05/18  Yes Wardell Honour, MD  metoprolol succinate (TOPROL-XL) 50 MG 24 hr tablet Take 1 tablet (50 mg total) by mouth daily. Take with or immediately following a meal. 01/26/18  Yes Wardell Honour, MD  omeprazole (PRILOSEC) 20 MG capsule TAKE 1 CAPSULE BY MOUTH EVERY DAY 01/26/18  Yes Wardell Honour, MD    oxyCODONE-acetaminophen (PERCOCET/ROXICET) 5-325 MG tablet Take by mouth every 4 (four) hours as needed for severe pain.   Yes [provider]  rivaroxaban (XARELTO) 20 MG TABS tablet Take 1 tablet (20 mg total) by mouth daily with supper. 10/08/16  Yes Skeet Latch, MD  traZODone (DESYREL) 50 MG tablet TAKE 1 TO 2 TABLETS BY MOUTH AT BEDTIME AS NEEDED FOR SLEEP 09/04/17  Yes Wardell Honour, MD  Cholecalciferol (VITAMIN D PO) Take by mouth daily.    [provider]  colestipol (COLESTID) 1 g tablet Take 2 g by mouth 2 (two) times daily. 03/05/18   [provider]  Cyanocobalamin (VITAMIN B12 PO) Take by mouth daily.    [provider]  losartan (COZAAR) 25 MG tablet Take 1 tablet (25 mg total) by mouth daily. 01/26/18   Wardell Honour, MD  nystatin (MYCOSTATIN) 100000 UNIT/ML suspension Take 5 mLs (500,000 Units total) by mouth 4 (four) times daily. 06/01/18   Wardell Honour, MD    No Known Allergies  Patient Active Problem List   Diagnosis Date Noted  . Systemic involvement of connective tissue ([place]) 06/06/2018  . Fibromyalgia 04/13/2018  . History of total knee replacement, bilateral 04/13/2018  . DDD (degenerative disc disease), lumbar 04/13/2018  . High risk medication use 03/06/2017  . Paroxysmal atrial fibrillation ([place]) 02/19/2016  . Chronic pain syndrome 02/12/2015  . Primary osteoarthritis  of both hands   . Esophageal reflux   . Autoimmune disease ([place])   . Hearing loss   . Essential hypertension, benign 11/01/2012  . Other abnormal glucose 11/01/2012  . Depression 11/01/2012  . Insomnia 11/01/2012    Past Medical History:  Diagnosis Date  . Alcohol abuse, daily use   . Allergic rhinitis, cause unspecified   . Dysthymic disorder   . Esophageal reflux   . Essential hypertension, benign   . Fibromyalgia    followed by [name] every six months.  . Fibromyositis   . Impotence of organic origin   . Insomnia, unspecified   . Left  knee DJD   . Lupus erythematosus   . Osteoarthrosis, unspecified whether generalized or localized, lower leg   . Other abnormal glucose   . Other specified disorder of male genital organs(608.89)   . Systemic lupus erythematosus ([place])   . Unspecified hearing loss   . Unspecified vitamin D deficiency     Past Surgical History:  Procedure Laterality Date  . CARPAL TUNNEL RELEASE     bilateral  . CHOLECYSTECTOMY    . COLONOSCOPY  03/01/2014   two polyps; Dr. Benson [place].  Repeat 5 years.  [name] GANGLION CYST EXCISION  1980   Left wrist  . JOINT REPLACEMENT  01/2013   right knee, left knee. [place].  [name] KNEE ARTHROPLASTY    . KNEE ARTHROSCOPY  2008   Right  . lumbar spine epidural  2009   [facility]  . ROTATOR CUFF REPAIR  10/2008   right  . TOTAL KNEE ARTHROPLASTY  12/27/2012   Procedure: TOTAL KNEE ARTHROPLASTY;  Surgeon: [name], MD;  Location: [facility];  Service: Orthopedics;  Laterality: Right;  RIGHT ARTHROPLASTY KNEE MEDIAL AND LATERAL COMPARTMENTS WITH PATELLA RESURFACING, RIGHT TOTAL KNEE REPLACEMENT  . TOTAL KNEE ARTHROPLASTY Left 01/31/2013   Procedure: TOTAL KNEE ARTHROPLASTY;  Surgeon: [name], MD;  Location: [facility];  Service: Orthopedics;  Laterality: Left;  . TOTAL SHOULDER ARTHROPLASTY      Social History   Socioeconomic History  . Marital status: Married    Spouse name: Not on file  . Number of children: 1  . Years of education: Not on file  . Highest education level: Not on file  Occupational History  . Occupation: Armed forces technical officer: [employer]    Comment: Paints cars/trucks  Social Needs  . Financial resource strain: Not on file  . Food insecurity:    Worry: Not on file    Inability: Not on file  . Transportation needs:    Medical: Not on file    Non-medical: Not on file  Tobacco Use  . Smoking status: Former Smoker    Packs/day: 2.00    Years: 30.00    Pack years: 60.00    Types: Cigarettes    Last attempt to quit: 12/28/2008     Years since quitting: 9.6  . Smokeless tobacco: Never Used  . Tobacco comment: quit 12 /2011  Substance and Sexual Activity  . Alcohol use: Yes    Alcohol/week: 10.0 standard drinks    Types: 6 Cans of beer, 4 Standard drinks or equivalent per week  . Drug use: No  . Sexual activity: Yes    Birth control/protection: Post-menopausal  Lifestyle  . Physical activity:    Days per week: Not on file    Minutes per session: Not on file  . Stress: Not on file  Relationships  . Social  connections:    Talks on phone: Not on file    Gets together: Not on file    Attends religious service: Not on file    Active member of club or organization: Not on file    Attends meetings of clubs or organizations: Not on file    Relationship status: Not on file  . Intimate partner violence:    Fear of current or ex partner: Not on file    Emotionally abused: Not on file    Physically abused: Not on file    Forced sexual activity: Not on file  Other Topics Concern  . Not on file  Social History Narrative   Guns in the home stored in locked cabinet.       Caffeine use: 1 serving/ day.     Marital status:  Married x 25 years, third marriage, happily married.      Children: one son (6); one step-daughter; two grandsons      Lives: with wife. Mother-in-law going to move in in 2017.      Employment:  Works at [employer] on [employer]; [employer] work; happy; 25 hours per week.      Tobacco: none currently; quit 2013.  Smoked x 40 years.      Alcohol:  2-6 beers per night in 2018; much heavier use in fifties.      Drugs: none      Exercise:  None in 2017.      Seatbelt: 100%      ADLs: independent with ADLs in 2017; no assistant devices      Advanced Directives: none in 2017; FULL CODE in 2017; no prolonged measures    Family History  Problem Relation Age of Onset  . Alzheimer's disease Mother   . Osteoporosis Mother   . Heart disease Father 82       AMI  . Alcohol abuse Father   . Cancer Brother         colon cancer  . Diabetes Brother   . Kidney failure Brother      Review of Systems  Constitutional: Negative.  Negative for chills and fever.  HENT: Negative.  Negative for congestion, nosebleeds and sore throat.   Eyes: Negative.  Negative for blurred vision and double vision.  Respiratory: Negative.  Negative for cough and shortness of breath.   Cardiovascular: Negative.  Negative for chest pain and palpitations.  Gastrointestinal: Positive for diarrhea and nausea.  Genitourinary: Negative.  Negative for dysuria and hematuria.  Musculoskeletal: Negative.  Negative for back pain, myalgias and neck pain.  Skin: Negative.  Negative for rash.  Neurological: Negative.  Negative for dizziness and headaches.  Endo/Heme/Allergies: Negative.        Excessive swelling  All other systems reviewed and are negative.   Vitals:   08/04/18 1625  BP: (!) 143/94  Pulse: 88  Resp: 16  Temp: 98.6 F (37 C)  SpO2: 94%   EKG: Normal sinus rhythm with ventricular rate of 82.  No acute ischemic changes.  Normal EKG. Physical Exam  Constitutional: He is oriented to person, place, and time. He appears well-developed and well-nourished.  HENT:  Head: Normocephalic and atraumatic.  Nose: Nose normal.  Mouth/Throat: Oropharynx is clear and moist.  Eyes: Pupils are equal, round, and reactive to light. Conjunctivae and EOM are normal.  Neck: Normal range of motion. Neck supple.  Cardiovascular: Normal rate and regular rhythm.  Pulmonary/Chest: Effort normal and breath sounds normal.  Abdominal: Soft. He exhibits  no distension. There is no tenderness.  Musculoskeletal: Normal range of motion.  Neurological: He is alert and oriented to person, place, and time.  Skin: Skin is warm and dry. Capillary refill takes less than 2 seconds.  Psychiatric: He has a normal mood and affect. His behavior is normal.  Vitals reviewed.    ASSESSMENT & PLAN: Tarez was seen today for excessive  sweating.  Diagnoses and all orders for this visit:  Nausea without vomiting -     CBC with Differential/Platelet -     Comprehensive metabolic panel -     EKG 90-WIOX -     ondansetron (ZOFRAN) 4 MG tablet; Take 1 tablet (4 mg total) by mouth every 8 (eight) hours as needed for nausea or vomiting.  Excessive sweating -     CBC with Differential/Platelet -     Comprehensive metabolic panel -     EKG 73-ZHGD    Patient Instructions       If you have lab work done today you will be contacted with your lab results within the next 2 weeks.  If you have not heard from [place] then please contact us. The fastest way to get your results is to register for My Chart.   IF you received an x-ray today, you will receive an invoice from [facility]. Please contact [facility] at [phone] with questions or concerns regarding your invoice.   IF you received labwork today, you will receive an invoice from [facility]. Please contact [facility] at [phone] with questions or concerns regarding your invoice.   Our billing staff will not be able to assist you with questions regarding bills from these companies.  You will be contacted with the lab results as soon as they are available. The fastest way to get your results is to activate your My Chart account. Instructions are located on the last page of this paperwork. If you have not heard from [place] regarding the results in 2 weeks, please contact this office.     Nausea, Adult Feeling sick to your stomach (nausea) means that your stomach is upset or you feel like you have to throw up (vomit). Feeling sick to your stomach is usually not serious, but it may be an early sign of a more serious medical problem. As you feel sicker to your stomach, it can lead to throwing up (vomiting). If you throw up, or if you are not able to drink enough fluids, there is a risk of dehydration. Dehydration can make you feel tired and thirsty, have a dry  mouth, and pee (urinate) less often. Older adults and people who have other diseases or a weak defense (immune) system have a higher risk of dehydration. The main goal of treating this condition is to:  Limit how often you feel sick to your stomach.  Prevent throwing up and dehydration.  Follow these instructions at home: Follow instructions from your doctor about how to care for yourself at home. Eating and drinking Follow these recommendations as told by your doctor:  Take an oral rehydration solution (ORS). This is a drink that is sold at pharmacies and stores.  Drink clear fluids in small amounts as you are able, such as: ? Water. ? Ice chips. ? Fruit juice that has water added (diluted fruit juice). ? Low-calorie sports drinks.  Eat bland, easy to digest foods in small amounts as you are able, such as: ? Bananas. ? Applesauce. ? Rice. ? Lean meats. ? Toast. ? Crackers.  Avoid drinking fluids that contain a lot of sugar or caffeine.  Avoid alcohol.  Avoid spicy or fatty foods.  General instructions  Drink enough fluid to keep your pee (urine) clear or pale yellow.  Wash your hands often. If you cannot use soap and water, use hand sanitizer.  Make sure that all people in your household wash their hands well and often.  Rest at home while you get better.  Take over-the-counter and prescription medicines only as told by your doctor.  Breathe slowly and deeply when you feel sick to your stomach.  Watch your condition for any changes.  Keep all follow-up visits as told by your doctor. This is important. Contact a doctor if:  You have a headache.  You have new symptoms.  You feel sicker to your stomach.  You have a fever.  You feel light-headed or dizzy.  You throw up.  You are not able to keep fluids down. Get help right away if:  You have pain in your chest, neck, arm, or jaw.  You feel very weak or you pass out (faint).  You have throw up that  is bright red or looks like coffee grounds.  You have bloody or black poop (stools), or poop that looks like tar.  You have a very bad headache, a stiff neck, or both.  You have very bad pain, cramping, or bloating in your belly.  You have a rash.  You have trouble breathing or you are breathing very quickly.  Your heart is beating very quickly.  Your skin feels cold and clammy.  You feel confused.  You have pain while peeing.  You have signs of dehydration, such as: ? Dark pee, or very little or no pee. ? Cracked lips. ? Dry mouth. ? Sunken eyes. ? Sleepiness. ? Weakness. These symptoms may be an emergency. Do not wait to see if the symptoms will go away. Get medical help right away. Call your local emergency services (911 in the [place] Do not drive yourself to the hospital. This information is not intended to replace advice given to you by your health care provider. Make sure you discuss any questions you have with your health care provider. Document Released: 11/06/2011 Document Revised: 04/24/2016 Document Reviewed: 07/24/2015 Elsevier Interactive Patient Education  2018 Elsevier Inc.      Agustina Caroli, MD [facility]

## 2018-08-04 NOTE — Patient Instructions (Addendum)
If you have lab work done today you will be contacted with your lab results within the next 2 weeks.  If you have not heard from [place] then please contact us. The fastest way to get your results is to register for My Chart.   IF you received an x-ray today, you will receive an invoice from [facility]. Please contact [facility] at [phone] with questions or concerns regarding your invoice.   IF you received labwork today, you will receive an invoice from [facility]. Please contact [facility] at [phone] with questions or concerns regarding your invoice.   Our billing staff will not be able to assist you with questions regarding bills from these companies.  You will be contacted with the lab results as soon as they are available. The fastest way to get your results is to activate your My Chart account. Instructions are located on the last page of this paperwork. If you have not heard from [place] regarding the results in 2 weeks, please contact this office.     Nausea, Adult Feeling sick to your stomach (nausea) means that your stomach is upset or you feel like you have to throw up (vomit). Feeling sick to your stomach is usually not serious, but it may be an early sign of a more serious medical problem. As you feel sicker to your stomach, it can lead to throwing up (vomiting). If you throw up, or if you are not able to drink enough fluids, there is a risk of dehydration. Dehydration can make you feel tired and thirsty, have a dry mouth, and pee (urinate) less often. Older adults and people who have other diseases or a weak defense (immune) system have a higher risk of dehydration. The main goal of treating this condition is to:  Limit how often you feel sick to your stomach.  Prevent throwing up and dehydration.  Follow these instructions at home: Follow instructions from your doctor about how to care for yourself at home. Eating and drinking Follow these  recommendations as told by your doctor:  Take an oral rehydration solution (ORS). This is a drink that is sold at pharmacies and stores.  Drink clear fluids in small amounts as you are able, such as: ? Water. ? Ice chips. ? Fruit juice that has water added (diluted fruit juice). ? Low-calorie sports drinks.  Eat bland, easy to digest foods in small amounts as you are able, such as: ? Bananas. ? Applesauce. ? Rice. ? Lean meats. ? Toast. ? Crackers.  Avoid drinking fluids that contain a lot of sugar or caffeine.  Avoid alcohol.  Avoid spicy or fatty foods.  General instructions  Drink enough fluid to keep your pee (urine) clear or pale yellow.  Wash your hands often. If you cannot use soap and water, use hand sanitizer.  Make sure that all people in your household wash their hands well and often.  Rest at home while you get better.  Take over-the-counter and prescription medicines only as told by your doctor.  Breathe slowly and deeply when you feel sick to your stomach.  Watch your condition for any changes.  Keep all follow-up visits as told by your doctor. This is important. Contact a doctor if:  You have a headache.  You have new symptoms.  You feel sicker to your stomach.  You have a fever.  You feel light-headed or dizzy.  You throw up.  You are not able to keep fluids down. Get help  right away if:  You have pain in your chest, neck, arm, or jaw.  You feel very weak or you pass out (faint).  You have throw up that is bright red or looks like coffee grounds.  You have bloody or black poop (stools), or poop that looks like tar.  You have a very bad headache, a stiff neck, or both.  You have very bad pain, cramping, or bloating in your belly.  You have a rash.  You have trouble breathing or you are breathing very quickly.  Your heart is beating very quickly.  Your skin feels cold and clammy.  You feel confused.  You have pain while  peeing.  You have signs of dehydration, such as: ? Dark pee, or very little or no pee. ? Cracked lips. ? Dry mouth. ? Sunken eyes. ? Sleepiness. ? Weakness. These symptoms may be an emergency. Do not wait to see if the symptoms will go away. Get medical help right away. Call your local emergency services (911 in the [place] Do not drive yourself to the hospital. This information is not intended to replace advice given to you by your health care provider. Make sure you discuss any questions you have with your health care provider. Document Released: 11/06/2011 Document Revised: 04/24/2016 Document Reviewed: 07/24/2015 Elsevier Interactive Patient Education  [employer].

## 2018-08-05 LAB — COMPREHENSIVE METABOLIC PANEL
ALBUMIN: 4.3 g/dL (ref 3.6–4.8)
ALT: 58 IU/L — AB (ref 0–44)
AST: 27 IU/L (ref 0–40)
Albumin/Globulin Ratio: 2 (ref 1.2–2.2)
Alkaline Phosphatase: 94 IU/L (ref 39–117)
BUN / CREAT RATIO: 16 (ref 10–24)
BUN: 10 mg/dL (ref 8–27)
Bilirubin Total: 0.4 mg/dL (ref 0.0–1.2)
CO2: 20 mmol/L (ref 20–29)
CREATININE: 0.62 mg/dL — AB (ref 0.76–1.27)
Calcium: 8.7 mg/dL (ref 8.6–10.2)
Chloride: 102 mmol/L (ref 96–106)
GFR calc non Af Amer: 101 mL/min/{1.73_m2} (ref >59)
GFR, EST AFRICAN AMERICAN: 117 mL/min/{1.73_m2} (ref >59)
GLUCOSE: 110 mg/dL — AB (ref 65–99)
Globulin, Total: 2.2 g/dL (ref 1.5–4.5)
Potassium: 3.9 mmol/L (ref 3.5–5.2)
Sodium: 137 mmol/L (ref 134–144)
TOTAL PROTEIN: 6.5 g/dL (ref 6.0–8.5)

## 2018-08-05 LAB — CBC WITH DIFFERENTIAL/PLATELET
Basophils Absolute: 0.1 10*3/uL (ref 0.0–0.2)
Basos: 1 %
EOS (ABSOLUTE): 0.2 10*3/uL (ref 0.0–0.4)
Eos: 3 %
HEMOGLOBIN: 15.5 g/dL (ref 13.0–17.7)
Hematocrit: 47.1 % (ref 37.5–51.0)
IMMATURE GRANS (ABS): 0 10*3/uL (ref 0.0–0.1)
IMMATURE GRANULOCYTES: 1 %
LYMPHS: 21 %
Lymphocytes Absolute: 1.3 10*3/uL (ref 0.7–3.1)
MCH: 28.3 pg (ref 26.6–33.0)
MCHC: 32.9 g/dL (ref 31.5–35.7)
MCV: 86 fL (ref 79–97)
MONOCYTES: 13 %
Monocytes Absolute: 0.8 10*3/uL (ref 0.1–0.9)
NEUTROS PCT: 61 %
Neutrophils Absolute: 3.7 10*3/uL (ref 1.4–7.0)
Platelets: 279 10*3/uL (ref 150–450)
RBC: 5.48 x10E6/uL (ref 4.14–5.80)
RDW: 13.5 % (ref 12.3–15.4)
WBC: 5.9 10*3/uL (ref 3.4–10.8)

## 2018-08-06 ENCOUNTER — Encounter: Payer: Self-pay | Admitting: *Deleted

## 2018-08-10 DIAGNOSIS — M19012 Primary osteoarthritis, left shoulder: Secondary | ICD-10-CM | POA: Diagnosis not present

## 2018-08-10 DIAGNOSIS — M19011 Primary osteoarthritis, right shoulder: Secondary | ICD-10-CM | POA: Diagnosis not present

## 2018-08-16 ENCOUNTER — Other Ambulatory Visit: Payer: Self-pay | Admitting: General Practice

## 2018-08-16 NOTE — Telephone Encounter (Signed)
Copied from [place] [id number]. Topic: Quick Communication - Rx Refill/Question >> Aug 16, 2018  3:40 PM Waldemar Dickens, Sade R wrote: Medication: diclofenac sodium (VOLTAREN) 1 % GEL  Has the patient contacted their pharmacy? Yes, was told it was denied but dont show request  Preferred Pharmacy (with phone number or street name): [id number] [place], [place] - [address] [phone] (Phone) [phone] (Fax)

## 2018-08-16 NOTE — Telephone Encounter (Signed)
Refill of Voltaren Gel 1 %  LRF 05/24/17  100g  4 refills Dr. [name]  LOV 08/04/18  Dr. Mitchel Honour  [facility] [place]

## 2018-09-02 DIAGNOSIS — M47812 Spondylosis without myelopathy or radiculopathy, cervical region: Secondary | ICD-10-CM | POA: Diagnosis not present

## 2018-09-02 DIAGNOSIS — M150 Primary generalized (osteo)arthritis: Secondary | ICD-10-CM | POA: Diagnosis not present

## 2018-09-02 DIAGNOSIS — Z79891 Long term (current) use of opiate analgesic: Secondary | ICD-10-CM | POA: Diagnosis not present

## 2018-09-02 DIAGNOSIS — M47816 Spondylosis without myelopathy or radiculopathy, lumbar region: Secondary | ICD-10-CM | POA: Diagnosis not present

## 2018-09-02 NOTE — Progress Notes (Signed)
Office Visit Note  Patient: David Weaver             Date of Birth: 02/25/1951           MRN: [id number]             PCP: Patient, No Pcp Per Referring: Wardell Honour, MD Visit Date: 09/16/2018 Occupation: @GUAROCC @  Subjective:  Right wrist pain   History of Present Illness: David Weaver is a 67 y.o. male with history of autoimmune disease, fibromyalgia, and osteoarthritis.  He is on PLQ 200 mg 1 tablet BID by mouth daily.  Patient denies any recent flares of autoimmune disease.  He denies any recent rashes or sores in his mouth or nose.  He denies any symptoms of Raynolds.  Denies any swollen lymph nodes or recent fevers.  He continues to have sicca symptoms.  He presents today with right wrist and right CMC joint pain.  He denies any joint swelling.  He states that the pain has been going on for the past couple months.  He states that he is still very active with his hands.  He states that he has been applying a topical cream but is unsure of the name which provides temporary relief.  He continues take oxycodone for pain relief which helps with the wrist pain.  He has not tried icing or wearing a brace.  He continues to have generalized muscle aches and muscle tenderness due to fibromyalgia.  He reports that bilateral knee replacements are doing well but he has occasional discomfort.  He reports he occasionally has discomfort in his left shoulder at night.  He reports his lower back is doing well at this time.   Activities of Daily Living:  Patient reports morning stiffness for 2  hours.   Patient Reports nocturnal pain.  Difficulty dressing/grooming: Denies Difficulty climbing stairs: Denies Difficulty getting out of chair: Denies Difficulty using hands for taps, buttons, cutlery, and/or writing: Denies  Review of Systems  Constitutional: Positive for fatigue. Negative for night sweats.  HENT: Positive for mouth dryness. Negative for mouth sores, trouble swallowing, trouble  swallowing and nose dryness.   Eyes: Positive for dryness. Negative for redness and visual disturbance.  Respiratory: Negative for cough, hemoptysis, shortness of breath and difficulty breathing.   Cardiovascular: Negative for chest pain, palpitations, hypertension, irregular heartbeat and swelling in legs/feet.  Gastrointestinal: Negative for blood in stool, constipation and diarrhea.  Endocrine: Negative for increased urination.  Genitourinary: Negative for painful urination.  Musculoskeletal: Positive for arthralgias, joint pain and morning stiffness. Negative for joint swelling, myalgias, muscle weakness, muscle tenderness and myalgias.  Skin: Positive for sensitivity to sunlight. Negative for color change, rash, hair loss, nodules/bumps, skin tightness and ulcers.  Allergic/Immunologic: Negative for susceptible to infections.  Neurological: Positive for headaches. Negative for dizziness, fainting, memory loss, night sweats and weakness.  Hematological: Negative for swollen glands.  Psychiatric/Behavioral: Negative for depressed mood and sleep disturbance. The patient is not nervous/anxious.     PMFS History:  Patient Active Problem List   Diagnosis Date Noted  . Nausea without vomiting 08/04/2018  . Excessive sweating 08/04/2018  . Systemic involvement of connective tissue ([place]) 06/06/2018  . Fibromyalgia 04/13/2018  . History of total knee replacement, bilateral 04/13/2018  . DDD (degenerative disc disease), lumbar 04/13/2018  . High risk medication use 03/06/2017  . Paroxysmal atrial fibrillation ([place]) 02/19/2016  . Chronic pain syndrome 02/12/2015  . Primary osteoarthritis of both hands   .  Esophageal reflux   . Autoimmune disease ([place])   . Hearing loss   . Essential hypertension, benign 11/01/2012  . Other abnormal glucose 11/01/2012  . Depression 11/01/2012  . Insomnia 11/01/2012    Past Medical History:  Diagnosis Date  . Alcohol abuse, daily use   . Allergic  rhinitis, cause unspecified   . Dysthymic disorder   . Esophageal reflux   . Essential hypertension, benign   . Fibromyalgia    followed by [name] every six months.  . Fibromyositis   . Impotence of organic origin   . Insomnia, unspecified   . Left knee DJD   . Lupus erythematosus   . Osteoarthrosis, unspecified whether generalized or localized, lower leg   . Other abnormal glucose   . Other specified disorder of male genital organs(608.89)   . Systemic lupus erythematosus ([place])   . Unspecified hearing loss   . Unspecified vitamin D deficiency     Family History  Problem Relation Age of Onset  . Alzheimer's disease Mother   . Osteoporosis Mother   . Heart disease Father 59       AMI  . Alcohol abuse Father   . Cancer Brother        colon cancer  . Diabetes Brother   . Kidney failure Brother    Past Surgical History:  Procedure Laterality Date  . CARPAL TUNNEL RELEASE     bilateral  . CHOLECYSTECTOMY    . COLONOSCOPY  03/01/2014   two polyps; Dr. Benson [place].  Repeat 5 years.  [name] GANGLION CYST EXCISION  1980   Left wrist  . JOINT REPLACEMENT  01/2013   right knee, left knee. [facility].  [name] KNEE ARTHROPLASTY    . KNEE ARTHROSCOPY  2008   Right  . lumbar spine epidural  2009   [facility]  . ROTATOR CUFF REPAIR  10/2008   right  . TOTAL KNEE ARTHROPLASTY  12/27/2012   Procedure: TOTAL KNEE ARTHROPLASTY;  Surgeon: [name], MD;  Location: [facility];  Service: Orthopedics;  Laterality: Right;  RIGHT ARTHROPLASTY KNEE MEDIAL AND LATERAL COMPARTMENTS WITH PATELLA RESURFACING, RIGHT TOTAL KNEE REPLACEMENT  . TOTAL KNEE ARTHROPLASTY Left 01/31/2013   Procedure: TOTAL KNEE ARTHROPLASTY;  Surgeon: [name], MD;  Location: [facility];  Service: Orthopedics;  Laterality: Left;  . TOTAL SHOULDER ARTHROPLASTY     Social History   Social History Narrative   Guns in the home stored in locked cabinet.       Caffeine use: 1 serving/ day.     Marital status:  Married x 25  years, third marriage, happily married.      Children: one son (74); one step-daughter; two grandsons      Lives: with wife. Mother-in-law going to move in in 2017.      Employment:  Works at [employer] on [employer]; [employer] work; happy; 25 hours per week.      Tobacco: none currently; quit 2013.  Smoked x 40 years.      Alcohol:  2-6 beers per night in 2018; much heavier use in fifties.      Drugs: none      Exercise:  None in 2017.      Seatbelt: 100%      ADLs: independent with ADLs in 2017; no assistant devices      Advanced Directives: none in 2017; FULL CODE in 2017; no prolonged measures    Objective: Vital Signs: BP (!) 144/88 (BP Location: Left Wrist,  Patient Position: Sitting, Cuff Size: Normal)   Pulse 71   Resp 15   Ht 5\' 8"  (1.727 m)   Wt 192 lb 3.2 oz (87.2 kg)   BMI 29.22 kg/m    Physical Exam  Constitutional: He is oriented to person, place, and time. He appears well-developed and well-nourished.  HENT:  Head: Normocephalic and atraumatic.  Eyes: Pupils are equal, round, and reactive to light. Conjunctivae and EOM are normal.  Neck: Normal range of motion. Neck supple.  Cardiovascular: Normal rate, regular rhythm and normal heart sounds.  Pulmonary/Chest: Effort normal and breath sounds normal.  Abdominal: Soft. Bowel sounds are normal.  Lymphadenopathy:    He has no cervical adenopathy.  Neurological: He is alert and oriented to person, place, and time.  Skin: Skin is warm and dry. Capillary refill takes less than 2 seconds.  Psychiatric: He has a normal mood and affect. His behavior is normal.  Nursing note and vitals reviewed.    Musculoskeletal Exam: C-spine, thoracic spine good ROM.  Lumbar spine limited ROM.  No midline spinal tenderness.  No SI joint tenderness.  Right shoulder full range of motion with no discomfort.  Left shoulder abduction to 120 degrees with some discomfort.  Elbow joints, wrist joints, MCPs, PIPs, DIPs good range of motion no synovitis.   PIP and DIP synovial thickening consistent with osteoarthritis of bilateral hands.  He has right CMC joint tenderness.  Hip joints good range of motion with no discomfort.  No tenderness of trochanter bursa bilaterally.  Bilateral knee replacements are doing well.  No warmth or effusion noted.  Ankle joints good range of motion with no warmth or effusion.  He has positive tender points for fibromyalgia.  CDAI Exam: CDAI Score: Not documented Patient Global Assessment: Not documented; Provider Global Assessment: Not documented Swollen: 0 ; Tender: 1  Joint Exam      Right  Left  CMC   Tender        Investigation: No additional findings.  Imaging: No results found.  Recent Labs: Lab Results  Component Value Date   WBC 5.9 08/04/2018   HGB 15.5 08/04/2018   PLT 279 08/04/2018   NA 137 08/04/2018   K 3.9 08/04/2018   CL 102 08/04/2018   CO2 20 08/04/2018   GLUCOSE 110 (H) 08/04/2018   BUN 10 08/04/2018   CREATININE 0.62 (L) 08/04/2018   BILITOT 0.4 08/04/2018   ALKPHOS 94 08/04/2018   AST 27 08/04/2018   ALT 58 (H) 08/04/2018   PROT 6.5 08/04/2018   ALBUMIN 4.3 08/04/2018   CALCIUM 8.7 08/04/2018   GFRAA 117 08/04/2018    Speciality Comments: PLQ Eye Exam 04/14/18 WNL @ [facility] follow in 6 months  Procedures:  No procedures performed Allergies: Patient has no known allergies.   Assessment / Plan:     Visit Diagnoses: Autoimmune disease ([place]) - +ANA, +Ro, +La. history of DLE and sicca: He has not had any recent autoimmune flares.  He has no synovitis on exam.  He has right CMC joint tenderness.  He was encouraged to wear his [facility] joint brace as well as using topical creams.  If he continues to have discomfort he knows that he can return for an ultrasound-guided cortisone injection.  He has not had any recent rashes.  No malar rash noted.  He continues to have some sensitivity.  He has not had symptoms of Raynaud's and no digital ulcerations were noted.  He continues  to have sicca  symptoms but no parotid swelling was noted.  No oral or nasal ulcerations were noted.  No cervical lymphadenopathy was palpable.  He is on any shortness of breath or palpitations recently.  He is clinically doing well on Plaquenil 200 mg 1 tablet twice daily.  He does not need any refills at this time.  We will check autoimmune labs today.- Plan: Urinalysis, Routine w reflex microscopic, Anti-DNA antibody, double-stranded, C3 and C4, Sedimentation rate, COMPLETE METABOLIC PANEL WITH GFR, CBC with Differential/Platelet  High risk medication use - PLQ Eye Exam 04/14/18 WNL @ [facility].  CBC and CMP will be drawn today to monitor for drug toxicity.- Plan: COMPLETE METABOLIC PANEL WITH GFR, CBC with Differential/Platelet  Fibromyalgia: Positive tender points on exam.  He continues to have generalized muscle aches muscle tenderness due to fibromyalgia.  He has chronic fatigue and insomnia.  He takes trazodone 50 mg at bedtime.  He is also on Cymbalta 120 mg by mouth daily.  He was encouraged to stay active and exercise on a regular basis.  Primary osteoarthritis of both hands: He has PIP and DIP synovial thickening consistent with osteoarthritis of bilateral hands.  He has right CMC joint tenderness.  He has been using a topical cream which has been providing temporary relief.  He is encouraged to wear his right [facility] joint brace.  If his pain continues he can return for a right CMC ultrasound-guided cortisone injection.  History of total knee replacement, bilateral: Doing well.  Good range of motion with no discomfort.  No warmth or effusion noted.  DDD (degenerative disc disease), lumbar: No midline spinal tenderness.  He has no discomfort at this time.  Other medical conditions are listed as well   History of gastroesophageal reflux (GERD)  History of hypertension  History of depression   Orders: Orders Placed This Encounter  Procedures  . Urinalysis, Routine w reflex microscopic   . Anti-DNA antibody, double-stranded  . C3 and C4  . Sedimentation rate  . COMPLETE METABOLIC PANEL WITH GFR  . CBC with Differential/Platelet   No orders of the defined types were placed in this encounter.    Follow-Up Instructions: Return in about 5 months (around 02/15/2019) for Autoimmune Disease, Fibromyalgia, Osteoarthritis.   Ofilia Neas, PA-C  Note - This record has been created using Dragon software.  Chart creation errors have been sought, but may not always  have been located. Such creation errors do not reflect on  the standard of medical care.

## 2018-09-12 ENCOUNTER — Other Ambulatory Visit: Payer: Self-pay | Admitting: Rheumatology

## 2018-09-13 NOTE — Telephone Encounter (Addendum)
Last Visit: 04/13/18 Next Visit: 09/16/18 Labs: 04/13/18 stable PLQ Eye Exam: 04/14/18 WNL  Okay to refill per Dr. Estanislado Pandy

## 2018-09-16 ENCOUNTER — Ambulatory Visit (INDEPENDENT_AMBULATORY_CARE_PROVIDER_SITE_OTHER): Payer: Medicare Other | Admitting: Physician Assistant

## 2018-09-16 ENCOUNTER — Encounter: Payer: Self-pay | Admitting: Physician Assistant

## 2018-09-16 ENCOUNTER — Ambulatory Visit: Payer: Medicare Other | Admitting: Rheumatology

## 2018-09-16 VITALS — BP 144/88 | HR 71 | Resp 15 | Ht 68.0 in | Wt 192.2 lb

## 2018-09-16 DIAGNOSIS — Z8679 Personal history of other diseases of the circulatory system: Secondary | ICD-10-CM

## 2018-09-16 DIAGNOSIS — Z8659 Personal history of other mental and behavioral disorders: Secondary | ICD-10-CM | POA: Diagnosis not present

## 2018-09-16 DIAGNOSIS — Z96653 Presence of artificial knee joint, bilateral: Secondary | ICD-10-CM | POA: Diagnosis not present

## 2018-09-16 DIAGNOSIS — Z8719 Personal history of other diseases of the digestive system: Secondary | ICD-10-CM | POA: Diagnosis not present

## 2018-09-16 DIAGNOSIS — M19042 Primary osteoarthritis, left hand: Secondary | ICD-10-CM

## 2018-09-16 DIAGNOSIS — M797 Fibromyalgia: Secondary | ICD-10-CM | POA: Diagnosis not present

## 2018-09-16 DIAGNOSIS — M19041 Primary osteoarthritis, right hand: Secondary | ICD-10-CM | POA: Diagnosis not present

## 2018-09-16 DIAGNOSIS — M5136 Other intervertebral disc degeneration, lumbar region: Secondary | ICD-10-CM

## 2018-09-16 DIAGNOSIS — Z79899 Other long term (current) drug therapy: Secondary | ICD-10-CM

## 2018-09-16 DIAGNOSIS — I6523 Occlusion and stenosis of bilateral carotid arteries: Secondary | ICD-10-CM

## 2018-09-16 DIAGNOSIS — M359 Systemic involvement of connective tissue, unspecified: Secondary | ICD-10-CM | POA: Diagnosis not present

## 2018-09-16 DIAGNOSIS — M51369 Other intervertebral disc degeneration, lumbar region without mention of lumbar back pain or lower extremity pain: Secondary | ICD-10-CM

## 2018-09-17 LAB — COMPLETE METABOLIC PANEL WITH GFR
AG Ratio: 2.2 (calc) (ref 1.0–2.5)
ALBUMIN MSPROF: 4.3 g/dL (ref 3.6–5.1)
ALT: 26 U/L (ref 9–46)
AST: 17 U/L (ref 10–35)
Alkaline phosphatase (APISO): 67 U/L (ref 40–115)
BUN: 13 mg/dL (ref 7–25)
CHLORIDE: 105 mmol/L (ref 98–110)
CO2: 27 mmol/L (ref 20–32)
Calcium: 8.9 mg/dL (ref 8.6–10.3)
Creat: 0.96 mg/dL (ref 0.70–1.25)
GFR, EST AFRICAN AMERICAN: 93 mL/min/{1.73_m2} (ref >=60)
GFR, EST NON AFRICAN AMERICAN: 80 mL/min/{1.73_m2} (ref >=60)
GLUCOSE: 112 mg/dL — AB (ref 65–99)
Globulin: 2 g/dL (calc) (ref 1.9–3.7)
Potassium: 4.1 mmol/L (ref 3.5–5.3)
Sodium: 139 mmol/L (ref 135–146)
Total Bilirubin: 0.4 mg/dL (ref 0.2–1.2)
Total Protein: 6.3 g/dL (ref 6.1–8.1)

## 2018-09-17 LAB — CBC WITH DIFFERENTIAL/PLATELET
BASOS ABS: 51 {cells}/uL (ref 0–200)
Basophils Relative: 0.8 %
EOS PCT: 2.8 %
Eosinophils Absolute: 179 cells/uL (ref 15–500)
HCT: 45.2 % (ref 38.5–50.0)
HEMOGLOBIN: 15.5 g/dL (ref 13.2–17.1)
LYMPHS ABS: 1370 {cells}/uL (ref 850–3900)
MCH: 28.8 pg (ref 27.0–33.0)
MCHC: 34.3 g/dL (ref 32.0–36.0)
MCV: 83.9 fL (ref 80.0–100.0)
MPV: 10.3 fL (ref 7.5–12.5)
Monocytes Relative: 10.7 %
NEUTROS ABS: 4115 {cells}/uL (ref 1500–7800)
NEUTROS PCT: 64.3 %
Platelets: 285 10*3/uL (ref 140–400)
RBC: 5.39 10*6/uL (ref 4.20–5.80)
RDW: 13.4 % (ref 11.0–15.0)
Total Lymphocyte: 21.4 %
WBC mixed population: 685 cells/uL (ref 200–950)
WBC: 6.4 10*3/uL (ref 3.8–10.8)

## 2018-09-17 LAB — URINALYSIS, ROUTINE W REFLEX MICROSCOPIC
Bilirubin Urine: NEGATIVE
Glucose, UA: NEGATIVE
Hgb urine dipstick: NEGATIVE
Ketones, ur: NEGATIVE
LEUKOCYTES UA: NEGATIVE
Nitrite: NEGATIVE
PROTEIN: NEGATIVE
Specific Gravity, Urine: 1.027 (ref 1.001–1.03)
pH: <=5 (ref 5.0–8.0)

## 2018-09-17 LAB — ANTI-DNA ANTIBODY, DOUBLE-STRANDED: ds DNA Ab: 4 IU/mL

## 2018-09-17 LAB — SEDIMENTATION RATE: SED RATE: 2 mm/h (ref 0–20)

## 2018-09-17 LAB — C3 AND C4
C3 Complement: 132 mg/dL (ref 82–185)
C4 Complement: 19 mg/dL (ref 15–53)

## 2018-09-17 IMAGING — NM NM MISC PROCEDURE
9 series · 54 of 54 positions shown · non-contrast
Comparison: none

[Series 1: wbr_r-proj_st wbr rest · 6.40mm/px · 6 of 64 frames shown]
[frame 6/64]
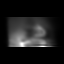
[frame 16/64]
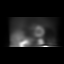
[frame 27/64]
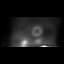
[frame 38/64]
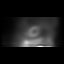
[frame 48/64]
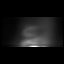
[frame 59/64]
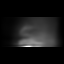

[Series 1: wbr rest · 6.40mm/px · 6 of 64 frames shown]
[frame 6/64]
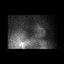
[frame 16/64]
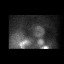
[frame 27/64]
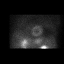
[frame 38/64]
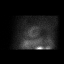
[frame 48/64]
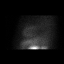
[frame 59/64]
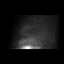

[Series 1: rest sax · 6.4mm · 6.40mm/px · 6 of 21 frames shown]
[frame 2/21]
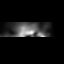
[frame 6/21]
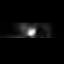
[frame 9/21]
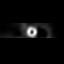
[frame 13/21]
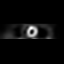
[frame 16/21]
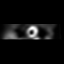
[frame 20/21]
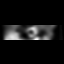

[Series 2: stress sax gs · 6.4mm · 6.40mm/px · 6 of 176 frames shown]
[frame 15/176]
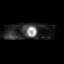
[frame 44/176]
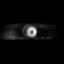
[frame 74/176]
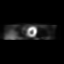
[frame 103/176]
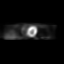
[frame 132/176]
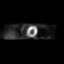
[frame 162/176]
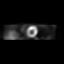

[Series 2: wbr stress-gsp · 6.40mm/px · 6 of 509 frames shown]
[frame 43/509]
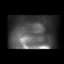
[frame 127/509]
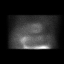
[frame 212/509]
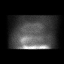
[frame 297/509]
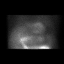
[frame 382/509]
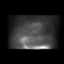
[frame 467/509]
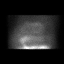

[Series 2: wbr_s-proj_st wbr stress-gsp · 6.40mm/px · 6 of 512 frames shown]
[frame 43/512]
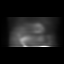
[frame 128/512]
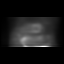
[frame 214/512]
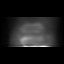
[frame 299/512]
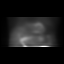
[frame 384/512]
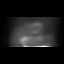
[frame 470/512]
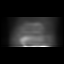

[Series 3: stress sax · 6.4mm · 6.40mm/px · 6 of 22 frames shown]
[frame 2/22]
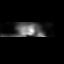
[frame 6/22]
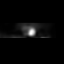
[frame 10/22]
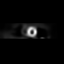
[frame 13/22]
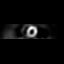
[frame 17/22]
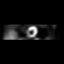
[frame 21/22]
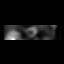

[Series 3: wbr_s-proj_st wbr stress-sum-em · 6.40mm/px · 6 of 64 frames shown]
[frame 6/64]
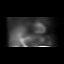
[frame 16/64]
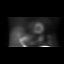
[frame 27/64]
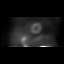
[frame 38/64]
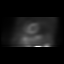
[frame 48/64]
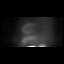
[frame 59/64]
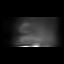

[Series 3: wbr stress-sum-em · 6.40mm/px · 6 of 64 frames shown]
[frame 6/64]
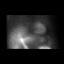
[frame 16/64]
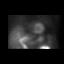
[frame 27/64]
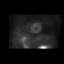
[frame 38/64]
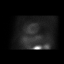
[frame 48/64]
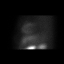
[frame 59/64]
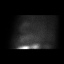

[54 of 54 positions shown; findings below may reference images not displayed]

Canned report from images found in remote index.

Refer to host system for actual result text.

## 2018-09-17 NOTE — Progress Notes (Signed)
Glucose is 112. All other labs are WNL.

## 2018-09-24 DIAGNOSIS — F5104 Psychophysiologic insomnia: Secondary | ICD-10-CM | POA: Diagnosis not present

## 2018-09-24 DIAGNOSIS — I10 Essential (primary) hypertension: Secondary | ICD-10-CM | POA: Diagnosis not present

## 2018-09-24 DIAGNOSIS — J301 Allergic rhinitis due to pollen: Secondary | ICD-10-CM | POA: Diagnosis not present

## 2018-09-24 DIAGNOSIS — K219 Gastro-esophageal reflux disease without esophagitis: Secondary | ICD-10-CM | POA: Diagnosis not present

## 2018-09-24 DIAGNOSIS — F988 Other specified behavioral and emotional disorders with onset usually occurring in childhood and adolescence: Secondary | ICD-10-CM | POA: Diagnosis not present

## 2018-09-24 DIAGNOSIS — E7800 Pure hypercholesterolemia, unspecified: Secondary | ICD-10-CM | POA: Diagnosis not present

## 2018-09-24 DIAGNOSIS — F4323 Adjustment disorder with mixed anxiety and depressed mood: Secondary | ICD-10-CM | POA: Diagnosis not present

## 2018-09-24 DIAGNOSIS — M5136 Other intervertebral disc degeneration, lumbar region: Secondary | ICD-10-CM | POA: Diagnosis not present

## 2018-09-24 DIAGNOSIS — M359 Systemic involvement of connective tissue, unspecified: Secondary | ICD-10-CM | POA: Diagnosis not present

## 2018-09-24 DIAGNOSIS — F3341 Major depressive disorder, recurrent, in partial remission: Secondary | ICD-10-CM | POA: Diagnosis not present

## 2018-09-24 DIAGNOSIS — M797 Fibromyalgia: Secondary | ICD-10-CM | POA: Diagnosis not present

## 2018-09-24 DIAGNOSIS — I480 Paroxysmal atrial fibrillation: Secondary | ICD-10-CM | POA: Diagnosis not present

## 2018-10-07 DIAGNOSIS — Z79891 Long term (current) use of opiate analgesic: Secondary | ICD-10-CM | POA: Diagnosis not present

## 2018-10-07 DIAGNOSIS — M47816 Spondylosis without myelopathy or radiculopathy, lumbar region: Secondary | ICD-10-CM | POA: Diagnosis not present

## 2018-10-07 DIAGNOSIS — M47812 Spondylosis without myelopathy or radiculopathy, cervical region: Secondary | ICD-10-CM | POA: Diagnosis not present

## 2018-10-07 DIAGNOSIS — M150 Primary generalized (osteo)arthritis: Secondary | ICD-10-CM | POA: Diagnosis not present

## 2018-10-13 DIAGNOSIS — H2513 Age-related nuclear cataract, bilateral: Secondary | ICD-10-CM | POA: Diagnosis not present

## 2018-11-11 DIAGNOSIS — M19012 Primary osteoarthritis, left shoulder: Secondary | ICD-10-CM | POA: Diagnosis not present

## 2018-11-11 DIAGNOSIS — M7542 Impingement syndrome of left shoulder: Secondary | ICD-10-CM | POA: Diagnosis not present

## 2018-11-18 DIAGNOSIS — M47816 Spondylosis without myelopathy or radiculopathy, lumbar region: Secondary | ICD-10-CM | POA: Diagnosis not present

## 2018-11-18 DIAGNOSIS — Z79891 Long term (current) use of opiate analgesic: Secondary | ICD-10-CM | POA: Diagnosis not present

## 2018-11-18 DIAGNOSIS — M150 Primary generalized (osteo)arthritis: Secondary | ICD-10-CM | POA: Diagnosis not present

## 2018-11-18 DIAGNOSIS — M47812 Spondylosis without myelopathy or radiculopathy, cervical region: Secondary | ICD-10-CM | POA: Diagnosis not present

## 2018-12-10 IMAGING — DX DG CHEST 2V
2 series · 2 of 2 positions shown · non-contrast
Comparison: 10/30/2016

CLINICAL DATA: Cough, wheezing, chills

EXAM:
CHEST - 2 VIEW

[chest pa]
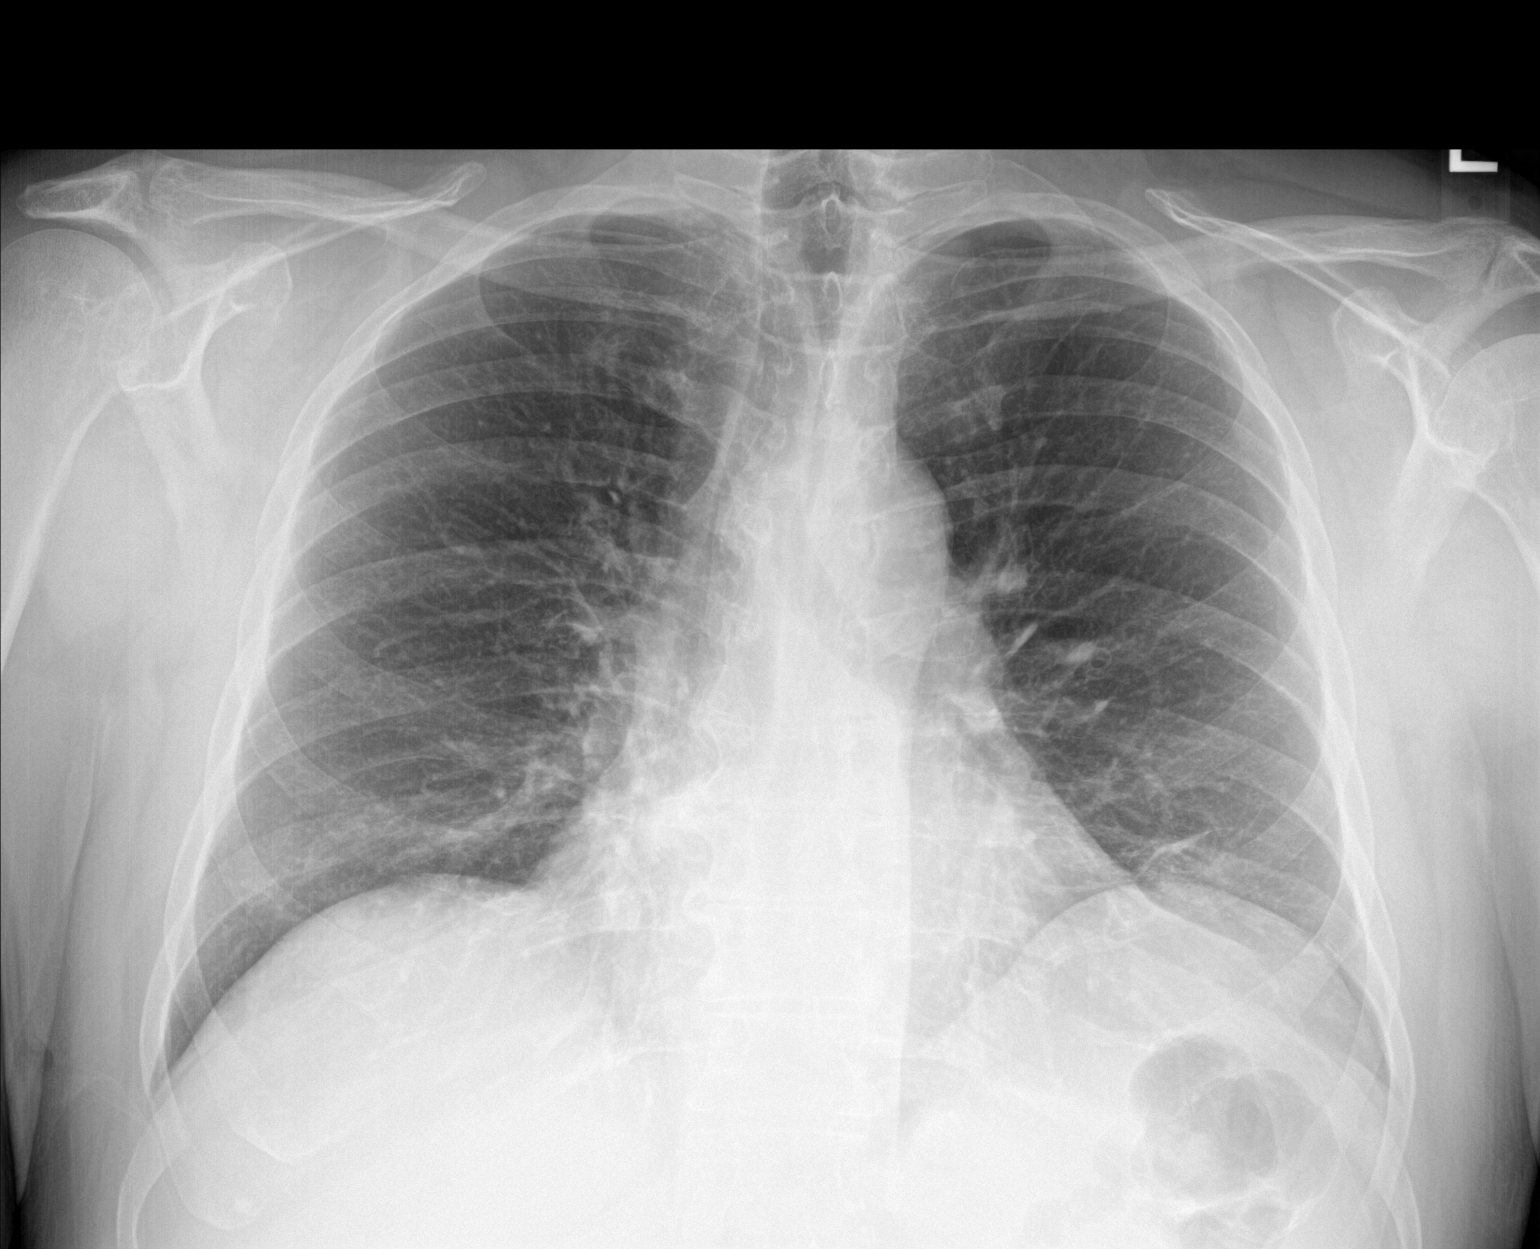

[chest lat]
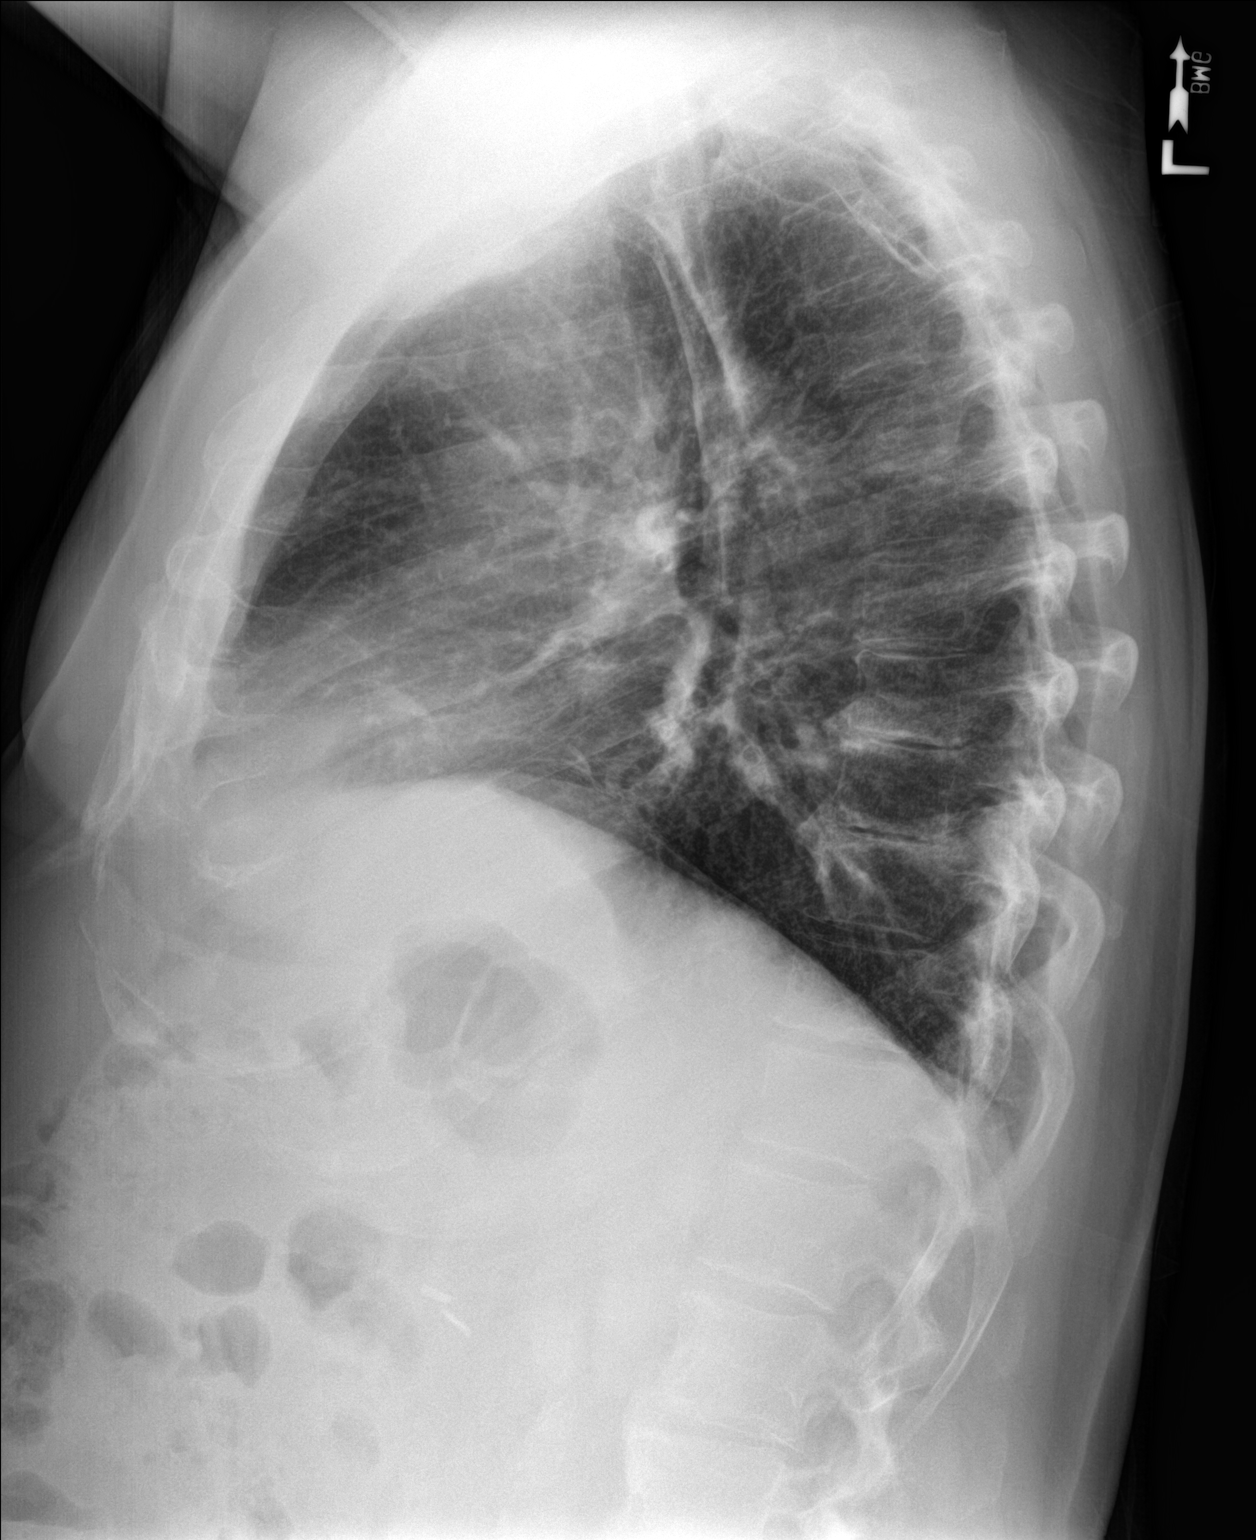

[2 of 2 positions shown; findings below may reference images not displayed]

FINDINGS: Scarring at the left base. Heart is normal size. No acute confluent
airspace opacities or effusions. No acute bony abnormality.
IMPRESSION: No active disease.

## 2019-01-05 ENCOUNTER — Other Ambulatory Visit: Payer: Self-pay | Admitting: Rheumatology

## 2019-01-05 NOTE — Telephone Encounter (Signed)
Last visit: 09/16/18 Next visit: 04/29/19 Labs: 09/16/18 Glucose is 112. All other labs are WNL.  PLQ Eye Exam 04/14/18 WNL  Okay to refill per Dr. Estanislado Pandy

## 2019-02-01 ENCOUNTER — Telehealth: Payer: Self-pay | Admitting: Cardiovascular Disease

## 2019-02-01 NOTE — Telephone Encounter (Signed)
Received a call from patient's wife requesting a appointment for her husband.Stated she has been tired,dizzy,double vision,labored breathing for 2 to 3 weeks.Appointment scheduled with Jory Sims DNP 02/08/19 at 11:30 am.Advised to go to ED if needed.

## 2019-02-08 ENCOUNTER — Ambulatory Visit: Payer: Medicare Other | Admitting: Adult Health

## 2019-02-14 ENCOUNTER — Other Ambulatory Visit: Payer: Self-pay | Admitting: Emergency Medicine

## 2019-02-14 DIAGNOSIS — R110 Nausea: Secondary | ICD-10-CM

## 2019-02-14 NOTE — Telephone Encounter (Signed)
Medication written by Dr. Mitchel Honour.  See OV on 08/04/2018 / Patient is requesting a refill of zofran.  Zofran discontinued on 09-16-2018 as patient not taking. /

## 2019-03-20 ENCOUNTER — Other Ambulatory Visit: Payer: Self-pay | Admitting: Emergency Medicine

## 2019-03-20 DIAGNOSIS — R110 Nausea: Secondary | ICD-10-CM

## 2019-03-31 ENCOUNTER — Other Ambulatory Visit: Payer: Self-pay | Admitting: Rheumatology

## 2019-03-31 DIAGNOSIS — M359 Systemic involvement of connective tissue, unspecified: Secondary | ICD-10-CM

## 2019-03-31 NOTE — Telephone Encounter (Signed)
Please schedule patient for a follow up visit. Patient was due March 2020. Thanks!

## 2019-03-31 NOTE — Telephone Encounter (Signed)
Last visit: 09/16/18 Next visit: was due March 2020. Message sent to the front to schedule Labs: 09/16/18 Glucose is 112. All other labs are WNL.  PLQ Eye Exam 04/14/18 WNL  Okay to refill per Dr. Estanislado Pandy

## 2019-04-04 ENCOUNTER — Telehealth: Payer: Self-pay | Admitting: Cardiovascular Disease

## 2019-04-05 ENCOUNTER — Telehealth (INDEPENDENT_AMBULATORY_CARE_PROVIDER_SITE_OTHER): Payer: Medicare Other | Admitting: Cardiovascular Disease

## 2019-04-05 ENCOUNTER — Encounter: Payer: Self-pay | Admitting: Cardiovascular Disease

## 2019-04-05 DIAGNOSIS — I480 Paroxysmal atrial fibrillation: Secondary | ICD-10-CM | POA: Diagnosis not present

## 2019-04-05 DIAGNOSIS — I6523 Occlusion and stenosis of bilateral carotid arteries: Secondary | ICD-10-CM | POA: Diagnosis not present

## 2019-04-05 DIAGNOSIS — I10 Essential (primary) hypertension: Secondary | ICD-10-CM | POA: Diagnosis not present

## 2019-04-05 MED ORDER — METOPROLOL SUCCINATE ER 100 MG PO TB24: 100.0000 mg | ORAL_TABLET | Freq: Every day | ORAL | 3 refills | Status: DC

## 2019-04-05 NOTE — Patient Instructions (Addendum)
   Medication Instructions:  INCREASE YOUR METOPROLOL TO 100 MG DAILY   If you need a refill on your cardiac medications before your next appointment, please call your pharmacy.   Lab work: NONE  Testing/Procedures: Your physician has requested that you have a carotid duplex. This test is an ultrasound of the carotid arteries in your neck. It looks at blood flow through these arteries that supply the brain with blood. Allow one hour for this exam. There are no restrictions or special instructions. WHEN COVID 19  RESTRICTIONS LIFTED  OFFICE WILL CALL YOU TO [place]  Follow-Up: At [facility], you and your health needs are our priority.  As part of our continuing mission to provide you with exceptional heart care, we have created designated Provider Care Teams.  These Care Teams include your primary Cardiologist (physician) and Advanced Practice Providers (APPs -  Physician Assistants and Nurse Practitioners) who all work together to provide you with the care you need, when you need it. You will need a follow up appointment in 6 months.  Please call our office 2 months in advance to schedule this appointment.  You may see Skeet Latch, MD or one of the following Advanced Practice Providers on your designated Care Team:   Kerin Ransom, PA-C Roby Lofts, [place] . Sande Rives, PA-C  Your physician recommends that you schedule a follow-up appointment in: [place] FOR BLOOD PRESSURE OFFICE WILL CALL YOU TO RESCHEDULE

## 2019-04-05 NOTE — Progress Notes (Signed)
Virtual Visit via Telephone Note   This visit type was conducted due to national recommendations for restrictions regarding the COVID-19 Pandemic (e.g. social distancing) in an effort to limit this patient's exposure and mitigate transmission in our community.  Due to his co-morbid illnesses, this patient is at least at moderate risk for complications without adequate follow up.  This format is felt to be most appropriate for this patient at this time.  The patient did not have access to video technology/had technical difficulties with video requiring transitioning to audio format only (telephone).  All issues noted in this document were discussed and addressed.  No physical exam could be performed with this format.  Please refer to the patient's chart for his  consent to telehealth for [facility].   Date:  04/05/2019   ID:  David Weaver, DOB 06-29-1951, MRN [id number]  Patient Location: Home Provider Location: Office  PCP:  Horald Pollen, MD  Cardiologist:  Skeet Latch, MD  Electrophysiologist:  None   Evaluation Performed:  Follow-Up Visit  Chief Complaint:  Follow up  History of Present Illness:    David Weaver is a 68 y.o.male with paroxysmal atrial fibrillation, hypertension and lupus who presents for follow up.  David Weaver was seen in clinic on 08/07/15 for a presurgical assessment.  At that time he was cleared for shoulder surgery.  During surgery David Weaver went into atrial fibrillation with RVR.  He reportedly went back into sinus rhythm without intervention. He was started on Eliquis 5mg  daily and referred to follow up with cardiology.  He subsequently had a 30 day event monitor to determine whether he needed to continue on anticoagulation. The monitor did reveal recurrent atrial fibrillation so anticoagulation was continued.  David Weaver reported exertional chest pain and was referred for an exercise Myoview 01/2018 that revealed LVEF 46% with global hypokinesis.  There was  no ischemia.   He achieved 10.5 METS on a Bruce protocol.  He was then referred for an echo that revealed LVEF 55 to 60% with grade 1 diastolic dysfunction.  At that appointment he also reported dizziness and had carotid Dopplers that revealed 1-39% ICA stenosis bilaterally.    He checks his blood pressure regularly and it typically is in the 140s.  His heart rate is usually in the 70s-80s.  He can't tell when he is in atrial fibrillation.  He notes some shortness of breath when going up a hill.  He has no shortness of breath when he walks on level ground.  He denies chest pain, lower extremity edema, orthopnea or PND.  He had some teeth pulled last month and still has oozing from his gums at times.  Overall he has been feeling well.  He has been working on his diet and lost 6lb.  He notes that   The patient does not have symptoms concerning for COVID-19 infection (fever, chills, cough, or new shortness of breath).    Past Medical History:  Diagnosis Date   Alcohol abuse, daily use    Allergic rhinitis, cause unspecified    Dysthymic disorder    Esophageal reflux    Essential hypertension, benign    Fibromyalgia    followed by [name] every six months.   Fibromyositis    Impotence of organic origin    Insomnia, unspecified    Left knee DJD    Lupus erythematosus    Osteoarthrosis, unspecified whether generalized or localized, lower leg    Other abnormal glucose  Other specified disorder of male genital organs(608.89)    Systemic lupus erythematosus ([place])    Unspecified hearing loss    Unspecified vitamin D deficiency    Past Surgical History:  Procedure Laterality Date   CARPAL TUNNEL RELEASE     bilateral   CHOLECYSTECTOMY     COLONOSCOPY  03/01/2014   two polyps; Dr. Benson [place].  Repeat 5 years.   GANGLION CYST EXCISION  1980   Left wrist   JOINT REPLACEMENT  01/2013   right knee, left knee. [facility].   KNEE ARTHROPLASTY     KNEE ARTHROSCOPY  2008   Right     lumbar spine epidural  2009   [facility]   ROTATOR CUFF REPAIR  10/2008   right   TOTAL KNEE ARTHROPLASTY  12/27/2012   Procedure: TOTAL KNEE ARTHROPLASTY;  Surgeon: [name], MD;  Location: [place];  Service: Orthopedics;  Laterality: Right;  RIGHT ARTHROPLASTY KNEE MEDIAL AND LATERAL COMPARTMENTS WITH PATELLA RESURFACING, RIGHT TOTAL KNEE REPLACEMENT   TOTAL KNEE ARTHROPLASTY Left 01/31/2013   Procedure: TOTAL KNEE ARTHROPLASTY;  Surgeon: [name], MD;  Location: [place];  Service: Orthopedics;  Laterality: Left;   TOTAL SHOULDER ARTHROPLASTY       Current Meds  Medication Sig   amLODipine (NORVASC) 5 MG tablet Take 1 tablet (5 mg total) by mouth daily.   atorvastatin (LIPITOR) 10 MG tablet Take 1 tablet (10 mg total) by mouth daily.   Cholecalciferol (VITAMIN D PO) Take by mouth daily.   colestipol (COLESTID) 1 g tablet Take 2 g by mouth 2 (two) times daily.   Dextroamphetamine Sulfate 20 MG TABS Take by mouth daily.    DULoxetine (CYMBALTA) 60 MG capsule Take 1 capsule (60 mg total) 2 (two) times daily by mouth.   hydroxychloroquine (PLAQUENIL) 200 MG tablet TAKE 1 TABLET BY MOUTH TWICE A DAY   losartan (COZAAR) 25 MG tablet Take 1 tablet (25 mg total) by mouth daily.   methocarbamol (ROBAXIN) 500 MG tablet Take 500 mg by mouth every 8 (eight) hours as needed for muscle spasms.   metoprolol succinate (TOPROL-XL) 100 MG 24 hr tablet Take 1 tablet (100 mg total) by mouth daily. Take with or immediately following a meal.   omeprazole (PRILOSEC) 40 MG capsule Take 40 mg by mouth daily.   ondansetron (ZOFRAN) 4 MG tablet TAKE 1 TABLET BY MOUTH EVERY 8 HOURS AS NEEDED FOR NAUSEA AND VOMITING   oxyCODONE-acetaminophen (PERCOCET/ROXICET) 5-325 MG tablet Take by mouth every 4 (four) hours as needed for severe pain.   rivaroxaban (XARELTO) 20 MG TABS tablet Take 1 tablet (20 mg total) by mouth daily with supper.   traZODone (DESYREL) 50 MG tablet TAKE 1 TO  2 TABLETS BY MOUTH AT BEDTIME AS NEEDED FOR SLEEP   [DISCONTINUED] metoprolol succinate (TOPROL-XL) 50 MG 24 hr tablet Take 1 tablet (50 mg total) by mouth daily. Take with or immediately following a meal.     Allergies:   Patient has no known allergies.   Social History   Tobacco Use   Smoking status: Former Smoker    Packs/day: 2.00    Years: 30.00    Pack years: 60.00    Types: Cigarettes    Last attempt to quit: 12/28/2008    Years since quitting: 10.2   Smokeless tobacco: Never Used   Tobacco comment: quit 12 /2011  Substance Use Topics   Alcohol use: Not Currently    Comment: occ   Drug use:  No     Family Hx: The patient's family history includes Alcohol abuse in his father; Alzheimer's disease in his mother; Cancer in his brother; Diabetes in his brother; Heart disease (age of onset: 54) in his father; Kidney failure in his brother; Osteoporosis in his mother.  ROS:   Please see the history of present illness.     All other systems reviewed and are negative.   Prior CV studies:   The following studies were reviewed today:  Lexiscan Myoview 02/10/18:  The left ventricular ejection fraction is mildly decreased (45-54%).  Nuclear stress EF: 46%. Mild generalized hypokinesis  There was no ST segment deviation noted during stress.  The study is normal.  This is a low risk study. No ischemia. Prior ECHO 2016 - normal EF.  Echo 02/23/18: Study Conclusions  - Left ventricle: The cavity size was normal. Systolic function was normal. The estimated ejection fraction was in the range of 55% to 60%. Wall motion was normal; there were no regional wall motion abnormalities. Doppler parameters are consistent with abnormal left ventricular relaxation (grade 1 diastolic dysfunction). - Left atrium: The atrium was mildly dilated.  Carotid Doppler 02/10/18: 1-39% ICA stenosis bilaterally  Labs/Other Tests and Data Reviewed:    EKG:  No ECG  reviewed.  Recent Labs: 06/01/2018: TSH 3.600 09/16/2018: ALT 26; BUN 13; Creat 0.96; Hemoglobin 15.5; Platelets 285; Potassium 4.1; Sodium 139   Recent Lipid Panel Lab Results  Component Value Date/Time   CHOL 177 01/18/2018 10:16 AM   TRIG 255 (H) 01/18/2018 10:16 AM   HDL 34 (L) 01/18/2018 10:16 AM   CHOLHDL 5.2 (H) 01/18/2018 10:16 AM   CHOLHDL 3.9 09/02/2016 10:35 AM   LDLCALC 92 01/18/2018 10:16 AM    Wt Readings from Last 3 Encounters:  04/05/19 186 lb (84.4 kg)  09/16/18 192 lb 3.2 oz (87.2 kg)  08/04/18 192 lb 12.8 oz (87.5 kg)     Objective:    BP (!) 165/86    Pulse (!) 108    Ht 5\' 8"  (1.727 m)    Wt 186 lb (84.4 kg)    BMI 28.28 kg/m  GENERAL: Well-appearing.  No acute distress. HEENT: Pupils equal round.  Oral mucosa unremarkable NECK:  No jugular venous distention, no visible thyromegaly EXT:  No edema, no cyanosis no clubbing SKIN:  No rashes no nodules NEURO:  Speech fluent.  Cranial nerves grossly intact.  Moves all 4 extremities freely PSYCH:  Cognitively intact, oriented to person place and time  ASSESSMENT & PLAN:    # Exertional chest pain: Resolved.  Exercise Myoview was negative for ischemia.  He was encouraged to increase his exercise to 150 minutes per week.    # Carotid stenosis: Mild 01/2018.  Repeat after COVID-19 restrictions.  Continue Xarelto and atorvastatin.  # Paroxysmal atrial fibrillation:  David Weaver had an episode of transient atrial fibrillation during surgery and was noted to have recurrent episodes on the 30 day event monitor.  His HR is >100 bpm and I suspect he is back in atrial fibrillation.  He is asymptomatic and his monitor does not tell if his heart rhythm is irregular.  We will increase metoprolol to 100 mg daily.  Continue Xarelto. This patients CHA2DS2-VASc Score and unadjusted Ischemic Stroke Rate (% per year) is equal to 2.2 % stroke rate/year from a score of 2  Above score calculated as 1 point each if present [CHF, HTN,  DM, Vascular=MI/PAD/Aortic Plaque, Age if 4-74, or Male] Above score  calculated as 2 points each if present [Age > 75, or Stroke/TIA/TE]  # Hypertension: BP is elevated today.  Increase metoprolol as above.  Continue amlodipine and losartan.  His goal is less than 130/80.    # Hyperlipidemia: Continue atorvastatin.  LDL goal <70.  Recheck lipids after COVID-19.  COVID-19 Education: The signs and symptoms of COVID-19 were discussed with the patient and how to seek care for testing (follow up with PCP or arrange E-visit).  The importance of social distancing was discussed today.  Time:   Today, I have spent 16 minutes with the patient with telehealth technology discussing the above problems.     Medication Adjustments/Labs and Tests Ordered: Current medicines are reviewed at length with the patient today.  Concerns regarding medicines are outlined above.   Tests Ordered: No orders of the defined types were placed in this encounter.   Medication Changes: Meds ordered this encounter  Medications   metoprolol succinate (TOPROL-XL) 100 MG 24 hr tablet    Sig: Take 1 tablet (100 mg total) by mouth daily. Take with or immediately following a meal.    Dispense:  90 tablet    Refill:  3    NEW DOSE, D/C 50 MG RX PATIENT WILL CALL WHEN NEEDS FILLED    Disposition:  Follow up APP in 1 month.  F/u with me in 6 months.  Signed, Skeet Latch, MD  04/05/2019 10:32 AM    [place]

## 2019-04-11 NOTE — Telephone Encounter (Signed)
I LMOM for patient to call, and schedule a rov with Lovena Le.

## 2019-04-11 NOTE — Progress Notes (Signed)
Office Visit Note  Patient: David Weaver             Date of Birth: 12/22/1950           MRN: [id number]             PCP: Horald Pollen, MD Referring: Horald Pollen, * Visit Date: 04/13/2019 Occupation: @GUAROCC @  Subjective:  Pain in both hands   History of Present Illness: David Weaver is a 68 y.o. male with history of autoimmune disease, fibromyalgia, and osteoarthritis.  He is taking Plaquenil 200 mg 1 tablet by mouth BID.  He has not had any recent flares.  He recently noticed a facial rash and was evaluated by his dermatologist who prescribed a topical agent which has been helping.  He reports that he does need to follow-up with his dermatologist soon.  He denies any other rashes or photosensitivity.  He denies any sores in his mouth or nose.  He continues to have chronic sicca symptoms.  He has no symptoms of Raynaud's and no digital ulcerations.  He continues to have pain and swelling in bilateral hands and bilateral wrist joints.  He works on cars as well as working at [employer] so he attributes most of his discomfort to overuse activities.  He has been experiencing symptoms of carpal tunnel and bilateral wrist joints.  He occasionally wears a left wrist splint for support.  He wears arthritis gloves at night.  He has been having increased muscle aches and muscle tenderness due to fibromyalgia.  He is trapezius muscle spasms and tenderness at the deltoid insertion site.  He has intermittent lower back pain.  He reports his level of fatigue has been stable and he has been sleeping well at night.  He continues to take Cymbalta 60 mg by mouth daily and trazodone 50 mg to 100 mg by mouth at bedtime for insomnia.  He takes Percocet as prescribed for pain relief.     Activities of Daily Living:  Patient reports morning stiffness for 1 hour.   Patient Denies nocturnal pain.  Difficulty dressing/grooming: Denies Difficulty climbing stairs: Denies Difficulty getting out of  chair: Denies Difficulty using hands for taps, buttons, cutlery, and/or writing: Denies  Review of Systems  Constitutional: Positive for fatigue. Negative for night sweats.  HENT: Positive for mouth dryness. Negative for mouth sores and nose dryness.   Eyes: Positive for dryness. Negative for pain, redness and itching.  Respiratory: Negative for cough, hemoptysis, shortness of breath, wheezing and difficulty breathing.   Cardiovascular: Negative for chest pain, palpitations, hypertension, irregular heartbeat and swelling in legs/feet.  Gastrointestinal: Negative for abdominal pain, constipation and diarrhea.  Endocrine: Negative for increased urination.  Genitourinary: Negative for painful urination.  Musculoskeletal: Positive for arthralgias, joint pain and morning stiffness. Negative for joint swelling, myalgias, muscle weakness, muscle tenderness and myalgias.  Skin: Positive for rash. Negative for color change, hair loss, nodules/bumps, skin tightness, ulcers and sensitivity to sunlight.  Allergic/Immunologic: Negative for susceptible to infections.  Neurological: Negative for fainting, headaches, night sweats and weakness.  Hematological: Negative for swollen glands.  Psychiatric/Behavioral: Negative for depressed mood and sleep disturbance. The patient is not nervous/anxious.     PMFS History:  Patient Active Problem List   Diagnosis Date Noted  . Nausea without vomiting 08/04/2018  . Excessive sweating 08/04/2018  . Systemic involvement of connective tissue ([place]) 06/06/2018  . Fibromyalgia 04/13/2018  . History of total knee replacement, bilateral 04/13/2018  . DDD (  degenerative disc disease), lumbar 04/13/2018  . High risk medication use 03/06/2017  . Paroxysmal atrial fibrillation ([place]) 02/19/2016  . Chronic pain syndrome 02/12/2015  . Primary osteoarthritis of both hands   . Esophageal reflux   . Autoimmune disease ([place])   . Hearing loss   . Essential hypertension,  benign 11/01/2012  . Other abnormal glucose 11/01/2012  . Depression 11/01/2012  . Insomnia 11/01/2012    Past Medical History:  Diagnosis Date  . Alcohol abuse, daily use   . Allergic rhinitis, cause unspecified   . Dysthymic disorder   . Esophageal reflux   . Essential hypertension, benign   . Fibromyalgia    followed by [name] every six months.  . Fibromyositis   . Impotence of organic origin   . Insomnia, unspecified   . Left knee DJD   . Lupus erythematosus   . Osteoarthrosis, unspecified whether generalized or localized, lower leg   . Other abnormal glucose   . Other specified disorder of male genital organs(608.89)   . Systemic lupus erythematosus ([place])   . Unspecified hearing loss   . Unspecified vitamin D deficiency     Family History  Problem Relation Age of Onset  . Alzheimer's disease Mother   . Osteoporosis Mother   . Heart disease Father 34       AMI  . Alcohol abuse Father   . Cancer Brother        colon cancer  . Diabetes Brother   . Kidney failure Brother    Past Surgical History:  Procedure Laterality Date  . CARPAL TUNNEL RELEASE     bilateral  . CHOLECYSTECTOMY    . COLONOSCOPY  03/01/2014   two polyps; Dr. Benson Norway.  Repeat 5 years.  [name] GANGLION CYST EXCISION  1980   Left wrist  . JOINT REPLACEMENT  01/2013   right knee, left knee. [facility].  [name] KNEE ARTHROPLASTY    . KNEE ARTHROSCOPY  2008   Right  . lumbar spine epidural  2009   [facility]  . ROTATOR CUFF REPAIR  10/2008   right  . TOTAL KNEE ARTHROPLASTY  12/27/2012   Procedure: TOTAL KNEE ARTHROPLASTY;  Surgeon: [name], MD;  Location: [facility];  Service: Orthopedics;  Laterality: Right;  RIGHT ARTHROPLASTY KNEE MEDIAL AND LATERAL COMPARTMENTS WITH PATELLA RESURFACING, RIGHT TOTAL KNEE REPLACEMENT  . TOTAL KNEE ARTHROPLASTY Left 01/31/2013   Procedure: TOTAL KNEE ARTHROPLASTY;  Surgeon: [name], MD;  Location: [facility];  Service: Orthopedics;  Laterality: Left;  . TOTAL  SHOULDER ARTHROPLASTY     Social History   Social History Narrative   Guns in the home stored in locked cabinet.       Caffeine use: 1 serving/ day.     Marital status:  Married x 25 years, third marriage, happily married.      Children: one son (32); one step-daughter; two grandsons      Lives: with wife. Mother-in-law going to move in in 2017.      Employment:  Works at [employer] on [employer]; [employer] work; happy; 25 hours per week.      Tobacco: none currently; quit 2013.  Smoked x 40 years.      Alcohol:  2-6 beers per night in 2018; much heavier use in fifties.      Drugs: none      Exercise:  None in 2017.      Seatbelt: 100%      ADLs: independent with ADLs  in 2017; no assistant devices      Advanced Directives: none in 2017; FULL CODE in 2017; no prolonged measures   Immunization History  Administered Date(s) Administered  . Influenza, Seasonal, Injecte, Preservative Fre 11/01/2012  . Influenza,inj,Quad PF,6+ Mos 11/21/2013, 10/11/2014, 10/03/2015, 09/02/2016, 09/07/2017  . Influenza-Unspecified 08/20/2009, 08/19/2010, 08/12/2011  . Pneumococcal Conjugate-13 02/12/2015  . Pneumococcal Polysaccharide-23 02/19/2016  . Pneumococcal-Unspecified 03/03/2008  . Td 07/01/2001  . Tdap 02/03/2012  . Zoster 08/12/2011     Objective: Vital Signs: BP 129/77 (BP Location: Right Wrist, Patient Position: Sitting, Cuff Size: Normal)   Pulse 69   Resp 14   Ht 5\' 8"  (1.727 m)   Wt 189 lb 6.4 oz (85.9 kg)   BMI 28.80 kg/m    Physical Exam Vitals signs and nursing note reviewed.  Constitutional:      Appearance: He is well-developed.  HENT:     Head: Normocephalic and atraumatic.  Eyes:     Conjunctiva/sclera: Conjunctivae normal.     Pupils: Pupils are equal, round, and reactive to light.  Neck:     Musculoskeletal: Normal range of motion and neck supple.  Cardiovascular:     Rate and Rhythm: Normal rate and regular rhythm.     Heart sounds: Normal heart sounds.  Pulmonary:      Effort: Pulmonary effort is normal.     Breath sounds: Normal breath sounds.  Abdominal:     General: Bowel sounds are normal.     Palpations: Abdomen is soft.  Lymphadenopathy:     Cervical: No cervical adenopathy.  Skin:    General: Skin is warm and dry.     Capillary Refill: Capillary refill takes less than 2 seconds.  Neurological:     Mental Status: He is alert and oriented to person, place, and time.  Psychiatric:        Behavior: Behavior normal.      Musculoskeletal Exam: C-spine limited range of motion with lateral rotation.  Has mild thoracic kyphosis.  He has slightly limited range of motion lumbar spine.  No midline spinal tenderness.  No SI joint tenderness.  Shoulder joints have good range of motion with some discomfort bilaterally.  Elbow joints have good range of motion with no discomfort.  He has tenderness and mild extensor tenosynovitis of bilateral wrist joints.  He has PIP and DIP synovial thickening consistent with osteoarthritis of bilateral hands.  He has bilateral CMC joint synovial thickening.  Joints have good range of motion with no discomfort.  Knee replacements have good range of motion with mild warmth.  No tenderness or swelling of ankle joints.  CDAI Exam: CDAI Score: Not documented Patient Global Assessment: Not documented; Provider Global Assessment: Not documented Swollen: Not documented; Tender: Not documented Joint Exam   Not documented   There is currently no information documented on the homunculus. Go to the Rheumatology activity and complete the homunculus joint exam.  Investigation: No additional findings.  Imaging: No results found.  Recent Labs: Lab Results  Component Value Date   WBC 6.4 09/16/2018   HGB 15.5 09/16/2018   PLT 285 09/16/2018   NA 139 09/16/2018   K 4.1 09/16/2018   CL 105 09/16/2018   CO2 27 09/16/2018   GLUCOSE 112 (H) 09/16/2018   BUN 13 09/16/2018   CREATININE 0.96 09/16/2018   BILITOT 0.4 09/16/2018    ALKPHOS 94 08/04/2018   AST 17 09/16/2018   ALT 26 09/16/2018   PROT 6.3 09/16/2018   ALBUMIN 4.3  08/04/2018   CALCIUM 8.9 09/16/2018   GFRAA 93 09/16/2018    Speciality Comments: PLQ Eye Exam 04/14/18 WNL @ [facility] follow in 6 months  Procedures:  No procedures performed Allergies: Patient has no known allergies.   Assessment / Plan:     Visit Diagnoses: Autoimmune disease ([place]) - +ANA, +Ro, +La. history of DLE and sicca -He is not experiencing any signs or symptoms of a flare.  He is clinically doing well on Plaquenil 200 mg 1 tablet twice daily.  He has not missed any doses recently.  He continues to have chronic pain and inflammation of bilateral wrist joints.  He has PIP and DIP synovial thickening consistent with osteoarthritis of bilateral hands.  He has intermittent facial rashes but is followed by his dermatologist and uses topical agents.  He has no oral or nasal ulcerations.  He has not had any symptoms of Raynaud's and no digital ulcerations or signs of gangrene were noted.  He continues to have chronic sicca symptoms.  His level of fatigue has been stable and he has not had any recent fevers or swollen lymph nodes.  He will continue taking Plaquenil 200 mg 1 tablet twice daily.  He does not need any refills at this time.  Future orders for autoimmune labs are placed today.  He will return for lab work next week.  He was advised to notify us if he develops any new or worsening symptoms.  He will follow-up in the office in 5 months.  Plan: CBC with Differential/Platelet, COMPLETE METABOLIC PANEL WITH GFR, Urinalysis, Routine w reflex microscopic, Anti-DNA antibody, double-stranded, C3 and C4, Sedimentation rate, VITAMIN D 25 Hydroxy (Vit-D Deficiency, Fractures)  High risk medication use - PLQ 200 mg twice daily.  Eye Exam 04/14/18.  He has a Plaquenil eye exam scheduled on 04/29/2019.  He was given a Plaquenil eye exam form to take with him to his appointment.  Future orders for  CBC and CMP were placed today.- Plan: CBC with Differential/Platelet, COMPLETE METABOLIC PANEL WITH GFR  Fibromyalgia: He continues have generalized muscle aches and muscle tenderness due to fibromyalgia.  He has been experiencing increased trapezius muscle tension and muscle spasms bilaterally.  His level of fatigue has been stable and he has been sleeping well at night.  He takes trazodone 50 mg to 100 mg by mouth at bedtime to help him sleep.  He continues to take Cymbalta 60 mg by mouth daily.  He is prescribed Percocet for pain relief.  He was encouraged to stay active and exercise on a regular basis.  Primary osteoarthritis of both hands: He has PIP and DIP synovial thickening consistent with osteoarthritis of bilateral hands.  Has bilateral CMC joint synovial thickening.  He performs overuse activities on a daily basis at work and at home.  He has been experiencing symptoms of carpal tunnel bilaterally.  He was given a prescription for bilateral carpal tunnel splints.  Joint protection muscle strengthening were discussed.  History of total knee replacement, bilateral: Doing well.  He has good range of motion with no discomfort.  He has mild warmth of bilateral knee replacements.  DDD (degenerative disc disease), lumbar: He has intermittent lower back pain.  Paresthesia of both hands: He has been experiencing paresthesias in bilateral hands.  He performs overuse activities on a daily basis.  He was encouraged to try to avoid these activities.  He was given a prescription for bilateral carpal tunnel splints.  He will notify us if  his symptoms persist or worsen.  Other medical conditions are listed as follows:  History of depression  History of gastroesophageal reflux (GERD)  History of hypertension    Orders: Orders Placed This Encounter  Procedures  . CBC with Differential/Platelet  . COMPLETE METABOLIC PANEL WITH GFR  . Urinalysis, Routine w reflex microscopic  . Anti-DNA antibody,  double-stranded  . C3 and C4  . Sedimentation rate  . VITAMIN D 25 Hydroxy (Vit-D Deficiency, Fractures)   No orders of the defined types were placed in this encounter.   Face-to-face time spent with patient was 30 minutes. Greater than 50% of time was spent in counseling and coordination of care.  Follow-Up Instructions: Return in about 5 months (around 09/13/2019) for Autoimmune Disease.   Ofilia Neas, PA-C  Note - This record has been created using Dragon software.  Chart creation errors have been sought, but may not always  have been located. Such creation errors do not reflect on  the standard of medical care.

## 2019-04-13 ENCOUNTER — Other Ambulatory Visit: Payer: Self-pay

## 2019-04-13 ENCOUNTER — Ambulatory Visit (INDEPENDENT_AMBULATORY_CARE_PROVIDER_SITE_OTHER): Payer: Medicare Other | Admitting: Physician Assistant

## 2019-04-13 ENCOUNTER — Encounter: Payer: Self-pay | Admitting: Physician Assistant

## 2019-04-13 VITALS — BP 129/77 | HR 69 | Resp 14 | Ht 68.0 in | Wt 189.4 lb

## 2019-04-13 DIAGNOSIS — M19042 Primary osteoarthritis, left hand: Secondary | ICD-10-CM

## 2019-04-13 DIAGNOSIS — Z79899 Other long term (current) drug therapy: Secondary | ICD-10-CM

## 2019-04-13 DIAGNOSIS — Z8719 Personal history of other diseases of the digestive system: Secondary | ICD-10-CM

## 2019-04-13 DIAGNOSIS — Z96653 Presence of artificial knee joint, bilateral: Secondary | ICD-10-CM

## 2019-04-13 DIAGNOSIS — Z8659 Personal history of other mental and behavioral disorders: Secondary | ICD-10-CM

## 2019-04-13 DIAGNOSIS — M19041 Primary osteoarthritis, right hand: Secondary | ICD-10-CM

## 2019-04-13 DIAGNOSIS — M359 Systemic involvement of connective tissue, unspecified: Secondary | ICD-10-CM

## 2019-04-13 DIAGNOSIS — Z8679 Personal history of other diseases of the circulatory system: Secondary | ICD-10-CM

## 2019-04-13 DIAGNOSIS — R202 Paresthesia of skin: Secondary | ICD-10-CM

## 2019-04-13 DIAGNOSIS — M797 Fibromyalgia: Secondary | ICD-10-CM | POA: Diagnosis not present

## 2019-04-13 DIAGNOSIS — I6523 Occlusion and stenosis of bilateral carotid arteries: Secondary | ICD-10-CM

## 2019-04-13 DIAGNOSIS — M5136 Other intervertebral disc degeneration, lumbar region: Secondary | ICD-10-CM

## 2019-04-20 ENCOUNTER — Telehealth: Payer: Self-pay | Admitting: Cardiovascular Disease

## 2019-04-20 NOTE — Telephone Encounter (Signed)
   [facility] Pre-operative Risk Assessment    Request for surgical clearance:  1. What type of surgery is being performed? Colonoscopy with Propofol   2. When is this surgery scheduled? 05/10/19   3. What type of clearance is required (medical clearance vs. Pharmacy clearance to hold med vs. Both)? Both  4. Are there any medications that need to be held prior to surgery and how long? Xarelto   5. Practice name and name of physician performing surgery? [facility]/ Dr. Carol Ada   6. What is your office phone number [phone]    7.   What is your office fax number [phone]  8.   Anesthesia type (None, local, MAC, general) ? Propofol   David Weaver David Weaver 04/20/2019, 4:50 PM  _________________________________________________________________   (provider comments below)

## 2019-04-21 NOTE — Telephone Encounter (Signed)
Patient with diagnosis of afib on Xarelto for anticoagulation.    Procedure: colonoscopy Date of procedure: 05/10/19  CHADS2-VASc score of  2 (CHF, HTN, AGE, DM2, stroke/tia x 2, CAD, AGE, male)  CrCl 42ml/min  Per office protocol, patient can hold Xarelto for 1-2 days prior to procedure.

## 2019-04-22 ENCOUNTER — Other Ambulatory Visit: Payer: Self-pay

## 2019-04-22 DIAGNOSIS — Z79899 Other long term (current) drug therapy: Secondary | ICD-10-CM

## 2019-04-22 DIAGNOSIS — M359 Systemic involvement of connective tissue, unspecified: Secondary | ICD-10-CM

## 2019-04-22 NOTE — Telephone Encounter (Signed)
   Primary Cardiologist: Skeet Latch, MD  Chart reviewed as part of pre-operative protocol coverage. Patient was contacted 04/22/2019 in reference to pre-operative risk assessment for pending surgery as outlined below.  David Weaver was last seen on 04/05/2019 by Dr. Oval Linsey.  Since that day, David Weaver has done well from a cardiac standpoint. He denies having any chest pain, SOB, or palpitations. He can go up a flight of stair or walk a couple blocks without anginal complaints  Therefore, based on ACC/AHA guidelines, the patient would be at acceptable risk for the planned procedure without further cardiovascular testing.   Per pharmacy recommendations, patient can stop his xarelto 1-2 days prior to his upcoming colonoscopy and should restart this medication when cleared to do so by Dr. Benson Norway.   I will route this recommendation to the requesting party via Epic fax function and remove from pre-op pool.  Please call with questions.  Abigail Butts, PA-C 04/22/2019, 8:16 AM

## 2019-04-26 LAB — CBC WITH DIFFERENTIAL/PLATELET
Absolute Monocytes: 589 cells/uL (ref 200–950)
Basophils Absolute: 38 cells/uL (ref 0–200)
Basophils Relative: 0.7 %
Eosinophils Absolute: 319 cells/uL (ref 15–500)
Eosinophils Relative: 5.9 %
HCT: 44.5 % (ref 38.5–50.0)
Hemoglobin: 15.4 g/dL (ref 13.2–17.1)
Lymphs Abs: 1328 cells/uL (ref 850–3900)
MCH: 29.6 pg (ref 27.0–33.0)
MCHC: 34.6 g/dL (ref 32.0–36.0)
MCV: 85.6 fL (ref 80.0–100.0)
MPV: 10 fL (ref 7.5–12.5)
Monocytes Relative: 10.9 %
Neutro Abs: 3127 cells/uL (ref 1500–7800)
Neutrophils Relative %: 57.9 %
Platelets: 269 10*3/uL (ref 140–400)
RBC: 5.2 10*6/uL (ref 4.20–5.80)
RDW: 13.2 % (ref 11.0–15.0)
Total Lymphocyte: 24.6 %
WBC: 5.4 10*3/uL (ref 3.8–10.8)

## 2019-04-26 LAB — VITAMIN D 25 HYDROXY (VIT D DEFICIENCY, FRACTURES)

## 2019-04-26 LAB — URINALYSIS, ROUTINE W REFLEX MICROSCOPIC
Bacteria, UA: NONE SEEN /HPF
Bilirubin Urine: NEGATIVE
Glucose, UA: NEGATIVE
Hgb urine dipstick: NEGATIVE
Hyaline Cast: NONE SEEN /LPF
Leukocytes,Ua: NEGATIVE
Nitrite: NEGATIVE
RBC / HPF: NONE SEEN /HPF (ref 0–2)
Specific Gravity, Urine: 1.032 (ref 1.001–1.03)
Squamous Epithelial / LPF: NONE SEEN /HPF (ref <=5)
WBC, UA: NONE SEEN /HPF (ref 0–5)
pH: <=5 (ref 5.0–8.0)

## 2019-04-26 LAB — COMPLETE METABOLIC PANEL WITH GFR
AG Ratio: 2.3 (calc) (ref 1.0–2.5)
ALT: 33 U/L (ref 9–46)
AST: 24 U/L (ref 10–35)
Albumin: 4.3 g/dL (ref 3.6–5.1)
Alkaline phosphatase (APISO): 102 U/L (ref 35–144)
BUN: 14 mg/dL (ref 7–25)
CO2: 28 mmol/L (ref 20–32)
Calcium: 9.1 mg/dL (ref 8.6–10.3)
Chloride: 106 mmol/L (ref 98–110)
Creat: 0.88 mg/dL (ref 0.70–1.18)
GFR, Est African American: 101 mL/min/{1.73_m2} (ref >=60)
GFR, Est Non African American: 87 mL/min/{1.73_m2} (ref >=60)
Globulin: 1.9 g/dL (calc) (ref 1.9–3.7)
Glucose, Bld: 83 mg/dL (ref 65–99)
Potassium: 4.5 mmol/L (ref 3.5–5.3)
Sodium: 140 mmol/L (ref 135–146)
Total Bilirubin: 0.5 mg/dL (ref 0.2–1.2)
Total Protein: 6.2 g/dL (ref 6.1–8.1)

## 2019-04-26 LAB — C3 AND C4
C3 Complement: 123 mg/dL (ref 82–185)
C4 Complement: 20 mg/dL (ref 15–53)

## 2019-04-26 LAB — ANTI-DNA ANTIBODY, DOUBLE-STRANDED: ds DNA Ab: 7 IU/mL — ABNORMAL HIGH

## 2019-04-26 LAB — SEDIMENTATION RATE: Sed Rate: 6 mm/h (ref 0–20)

## 2019-04-26 NOTE — Progress Notes (Signed)
Sed rate and complements WNL.  CBC and CMP are WNL.   UA revealed 1+ protein.  Please order protein creatinine ratio.   DsDNA is positive.  Discussed with Dr. Estanislado Pandy and she would like to continue to monitor at this time.  Please advise patient to notify us if he develops new or worsening symptoms.

## 2019-04-29 ENCOUNTER — Ambulatory Visit (HOSPITAL_COMMUNITY)
Admission: RE | Admit: 2019-04-29 | Discharge: 2019-04-29 | Disposition: A | Discharge status: Home / Self Care | Payer: Medicare Other | Source: Ambulatory Visit | Attending: Internal Medicine | Admitting: Internal Medicine

## 2019-04-29 ENCOUNTER — Encounter (HOSPITAL_COMMUNITY): Payer: Medicare Other

## 2019-04-29 ENCOUNTER — Other Ambulatory Visit: Payer: Self-pay

## 2019-04-29 DIAGNOSIS — I6523 Occlusion and stenosis of bilateral carotid arteries: Secondary | ICD-10-CM

## 2019-05-02 ENCOUNTER — Encounter: Payer: Self-pay | Admitting: *Deleted

## 2019-05-02 ENCOUNTER — Other Ambulatory Visit: Payer: Self-pay

## 2019-05-02 DIAGNOSIS — I10 Essential (primary) hypertension: Secondary | ICD-10-CM

## 2019-05-02 DIAGNOSIS — Z87891 Personal history of nicotine dependence: Secondary | ICD-10-CM

## 2019-05-02 NOTE — Progress Notes (Signed)
Notes recorded by Skeet Latch, MD on 05/02/2019 at 12:10 PM EDT Mild blockage in bilateral carotid arteries unchanged from prior. Continue current medications. Come for lipid testing as planned. Repeat carotid Dopplers in 2 years.

## 2019-05-31 NOTE — Telephone Encounter (Signed)
Open n error °

## 2019-06-13 ENCOUNTER — Telehealth: Payer: Self-pay | Admitting: *Deleted

## 2019-06-13 DIAGNOSIS — I10 Essential (primary) hypertension: Secondary | ICD-10-CM

## 2019-06-13 DIAGNOSIS — I2510 Atherosclerotic heart disease of native coronary artery without angina pectoris: Secondary | ICD-10-CM

## 2019-06-13 NOTE — Telephone Encounter (Signed)
Patient never viewed, advised patient of results and needing Lipid panel

## 2019-06-13 NOTE — Telephone Encounter (Signed)
-----   Message from Skeet Latch, MD sent at 05/02/2019 12:10 PM EDT ----- Mild blockage in bilateral carotid arteries unchanged from prior.  Continue current medications.  Come for lipid testing as planned.  Repeat carotid Dopplers in 2 years.

## 2019-07-13 ENCOUNTER — Telehealth: Payer: Self-pay | Admitting: Rheumatology

## 2019-07-13 NOTE — Telephone Encounter (Signed)
I spoke with Dr. Estanislado Pandy and she would like the patient to schedule an appointment with [place].  Please advise him to schedule an appointment with dermatologist as well.

## 2019-07-13 NOTE — Progress Notes (Signed)
Office Visit Note  Patient: David Weaver             Date of Birth: 1951/05/30           MRN: [id number]             PCP: Horald Pollen, MD Referring: Horald Pollen, * Visit Date: 07/14/2019 Occupation: @GUAROCC @  Subjective:  Other (rash on chest/shoulder, onset 1 week ago. )   History of Present Illness: David Weaver is a 68 y.o. male with history of systemic and discoid lupus.  He states about 1 to 2 weeks ago he developed a rash on his upper body.  He is also noticed few scattered rashes on his forearm and his legs.  He states he does work on the cars and also do some gardening.  He usually wears a shirt.  He has been using some sunscreen.  He states recently he was having diarrhea and some abdominal pain and he was placed on omeprazole at the [facility].  Which helped his GI symptoms.  Activities of Daily Living:  Patient reports morning stiffness for 1 hour.   Patient Denies nocturnal pain.  Difficulty dressing/grooming: Denies Difficulty climbing stairs: Denies Difficulty getting out of chair: Denies Difficulty using hands for taps, buttons, cutlery, and/or writing: Denies  Review of Systems  Constitutional: Negative for fatigue.  HENT: Positive for mouth dryness. Negative for mouth sores and nose dryness.   Eyes: Positive for itching and dryness.  Respiratory: Negative for cough, shortness of breath, wheezing and difficulty breathing.   Cardiovascular: Negative for chest pain, palpitations and swelling in legs/feet.  Gastrointestinal: Negative for abdominal pain, blood in stool, constipation and diarrhea.  Endocrine: Negative for increased urination.  Genitourinary: Negative for painful urination.  Musculoskeletal: Positive for morning stiffness. Negative for arthralgias, joint pain and joint swelling.  Skin: Positive for rash. Negative for color change.  Allergic/Immunologic: Negative for susceptible to infections.  Neurological: Negative for  dizziness, headaches, memory loss and weakness.  Hematological: Positive for bruising/bleeding tendency.  Psychiatric/Behavioral: Negative for confusion and sleep disturbance. The patient is not nervous/anxious.     PMFS History:  Patient Active Problem List   Diagnosis Date Noted  . Nausea without vomiting 08/04/2018  . Excessive sweating 08/04/2018  . Systemic involvement of connective tissue ([place]) 06/06/2018  . Fibromyalgia 04/13/2018  . History of total knee replacement, bilateral 04/13/2018  . DDD (degenerative disc disease), lumbar 04/13/2018  . High risk medication use 03/06/2017  . Paroxysmal atrial fibrillation ([place]) 02/19/2016  . Chronic pain syndrome 02/12/2015  . Primary osteoarthritis of both hands   . Esophageal reflux   . Autoimmune disease ([place])   . Hearing loss   . Essential hypertension, benign 11/01/2012  . Other abnormal glucose 11/01/2012  . Depression 11/01/2012  . Insomnia 11/01/2012    Past Medical History:  Diagnosis Date  . Alcohol abuse, daily use   . Allergic rhinitis, cause unspecified   . Dysthymic disorder   . Esophageal reflux   . Essential hypertension, benign   . Fibromyalgia    followed by [name] every six months.  . Fibromyositis   . Impotence of organic origin   . Insomnia, unspecified   . Left knee DJD   . Lupus erythematosus   . Osteoarthrosis, unspecified whether generalized or localized, lower leg   . Other abnormal glucose   . Other specified disorder of male genital organs(608.89)   . Systemic lupus erythematosus ([place])   . Unspecified  hearing loss   . Unspecified vitamin D deficiency     Family History  Problem Relation Age of Onset  . Alzheimer's disease Mother   . Osteoporosis Mother   . Heart disease Father 57       AMI  . Alcohol abuse Father   . Cancer Brother        colon cancer  . Diabetes Brother   . Kidney failure Brother    Past Surgical History:  Procedure Laterality Date  . CARPAL TUNNEL RELEASE      bilateral  . CHOLECYSTECTOMY    . COLONOSCOPY  03/01/2014   two polyps; Dr. Benson [place].  Repeat 5 years.  [name] GANGLION CYST EXCISION  1980   Left wrist  . JOINT REPLACEMENT  01/2013   right knee, left knee. [facility].  [name] KNEE ARTHROPLASTY    . KNEE ARTHROSCOPY  2008   Right  . lumbar spine epidural  2009   [facility]  . ROTATOR CUFF REPAIR  10/2008   right  . TOTAL KNEE ARTHROPLASTY  12/27/2012   Procedure: TOTAL KNEE ARTHROPLASTY;  Surgeon: [name], MD;  Location: [facility];  Service: Orthopedics;  Laterality: Right;  RIGHT ARTHROPLASTY KNEE MEDIAL AND LATERAL COMPARTMENTS WITH PATELLA RESURFACING, RIGHT TOTAL KNEE REPLACEMENT  . TOTAL KNEE ARTHROPLASTY Left 01/31/2013   Procedure: TOTAL KNEE ARTHROPLASTY;  Surgeon: [name], MD;  Location: [place];  Service: Orthopedics;  Laterality: Left;  . TOTAL SHOULDER ARTHROPLASTY     Social History   Social History Narrative   Guns in the home stored in locked cabinet.       Caffeine use: 1 serving/ day.     Marital status:  Married x 25 years, third marriage, happily married.      Children: one son (56); one step-daughter; two grandsons      Lives: with wife. Mother-in-law going to move in in 2017.      Employment:  Works at [employer] on [employer]; [employer] work; happy; 25 hours per week.      Tobacco: none currently; quit 2013.  Smoked x 40 years.      Alcohol:  2-6 beers per night in 2018; much heavier use in fifties.      Drugs: none      Exercise:  None in 2017.      Seatbelt: 100%      ADLs: independent with ADLs in 2017; no assistant devices      Advanced Directives: none in 2017; FULL CODE in 2017; no prolonged measures   Immunization History  Administered Date(s) Administered  . Influenza, Seasonal, Injecte, Preservative Fre 11/01/2012  . Influenza,inj,Quad PF,6+ Mos 11/21/2013, 10/11/2014, 10/03/2015, 09/02/2016, 09/07/2017  . Influenza-Unspecified 08/20/2009, 08/19/2010, 08/12/2011  . Pneumococcal Conjugate-13  02/12/2015  . Pneumococcal Polysaccharide-23 02/19/2016  . Pneumococcal-Unspecified 03/03/2008  . Td 07/01/2001  . Tdap 02/03/2012  . Zoster 08/12/2011     Objective: Vital Signs: BP 125/67 (BP Location: Left Arm, Patient Position: Sitting, Cuff Size: Normal)   Pulse 80   Resp 14   Ht 5\' 8"  (1.727 m)   Wt 192 lb (87.1 kg)   BMI 29.19 kg/m    Physical Exam Vitals signs and nursing note reviewed.  Constitutional:      Appearance: He is well-developed.  HENT:     Head: Normocephalic and atraumatic.  Eyes:     Conjunctiva/sclera: Conjunctivae normal.     Pupils: Pupils are equal, round, and reactive to light.  Neck:  Musculoskeletal: Normal range of motion and neck supple.  Cardiovascular:     Rate and Rhythm: Normal rate and regular rhythm.     Heart sounds: Normal heart sounds.  Pulmonary:     Effort: Pulmonary effort is normal.     Breath sounds: Normal breath sounds.  Abdominal:     General: Bowel sounds are normal.     Palpations: Abdomen is soft.  Skin:    General: Skin is warm and dry.     Capillary Refill: Capillary refill takes less than 2 seconds.     Findings: Rash present.     Comments: Erythematous discoid rash noted on his chest and is a sun exposed area few scattered lesions on his back and few lesions on his forearm and lower extremity.  Neurological:     Mental Status: He is alert and oriented to person, place, and time.  Psychiatric:        Behavior: Behavior normal.      Musculoskeletal Exam: He has limited range of motion of his cervical and lumbar spine.  Shoulder joints and elbow joints with good range of motion.  He has DIP and PIP thickening in his bilateral hands with no synovitis.  Mild left extensor tenosynovitis was noted in his wrist joint.  Knee joints are replaced without any discomfort on range of motion.  No effusion was noted.  Ankle joints had no synovitis.  CDAI Exam: CDAI Score: - Patient Global: -; Provider Global: - Swollen:  -; Tender: - Joint Exam   No joint exam has been documented for this visit   There is currently no information documented on the homunculus. Go to the Rheumatology activity and complete the homunculus joint exam.  Investigation: No additional findings.  Imaging: No results found.  Recent Labs: Lab Results  Component Value Date   WBC 5.4 04/22/2019   HGB 15.4 04/22/2019   PLT 269 04/22/2019   NA 140 04/22/2019   K 4.5 04/22/2019   CL 106 04/22/2019   CO2 28 04/22/2019   GLUCOSE 83 04/22/2019   BUN 14 04/22/2019   CREATININE 0.88 04/22/2019   BILITOT 0.5 04/22/2019   ALKPHOS 94 08/04/2018   AST 24 04/22/2019   ALT 33 04/22/2019   PROT 6.2 04/22/2019   ALBUMIN 4.3 08/04/2018   CALCIUM 9.1 04/22/2019   GFRAA 101 04/22/2019    Speciality Comments: PLQ Eye Exam 04/29/19  @ [facility] follow in 1 year  Procedures:  No procedures performed Allergies: Patient has no known allergies.   Assessment / Plan:     Visit Diagnoses: Other organ or system involvement in systemic lupus erythematosus ([name]) - +ANA, +Ro, +La. history of DLE and sicca -patient continues to have some sicca symptoms.  He has new onset rash.  He has had problems with discoid lupus in the past.  The most likely cause of the discoid lupus is the addition of omeprazole.  I have advised him to discontinue omeprazole.  Need for applying sunscreen on a regular basis was discussed.  I have advised him to take famotidine instead.  I will also give him a prednisone taper starting at 20 mg and taper by 5 mg every 4 days.  Hopefully that will subside his rash.  I will obtain following labs to make sure that he is not having a systemic flare.  Plan: Anti-DNA antibody, double-stranded, C3 and C4, Sedimentation rate,   High risk medication use -  PLQ 200 mg twice daily.  Eye Exam  04/14/18. - Plan: CBC with Differential/Platelet, COMPLETE METABOLIC PANEL WITH GFR,   Fibromyalgia - trazodone 50 mg to 100 mg by mouth at bedtime  to help him sleep.  He continues to take Cymbalta 60 mg by mouth daily.  He continues to have some generalized discomfort.  Primary osteoarthritis of both hands -he has PIP and DIP thickening with no synovitis.  History of total knee replacement, bilateral -doing well.  DDD (degenerative disc disease), lumbar -he has chronic pain.  History of depression   History of gastroesophageal reflux (GERD)   History of hypertension -his blood pressure is well controlled.  Orders: Orders Placed This Encounter  Procedures  . CBC with Differential/Platelet  . COMPLETE METABOLIC PANEL WITH GFR  . Anti-DNA antibody, double-stranded  . C3 and C4  . Sedimentation rate   Meds ordered this encounter  Medications  . predniSONE (DELTASONE) 5 MG tablet    Sig: Take 4 tablets (20 mg total) by mouth daily for 4 days, THEN 3 tablets (15 mg total) daily for 4 days, THEN 2 tablets (10 mg total) daily for 4 days, THEN 1 tablet (5 mg total) daily for 4 days, THEN 0.5 tablets (2.5 mg total) daily for 4 days.    Dispense:  42 tablet    Refill:  0    Face-to-face time spent with patient was 30 minutes. Greater than 50% of time was spent in counseling and coordination of care.  Follow-Up Instructions: Return in about 5 months (around 12/14/2019) for Systemic lupus.   Bo Merino, MD  Note - This record has been created using Editor, commissioning.  Chart creation errors have been sought, but may not always  have been located. Such creation errors do not reflect on  the standard of medical care.

## 2019-07-13 NOTE — Telephone Encounter (Signed)
Spoke with [name]. She state the patient has places that are breaking out "all over him". They started on his forearms and then to his chest and stomach. [name] states the rash is round, red, flat, yellow in the center and no texture. She states the outside looks inflamed. She would like to know if he should go see the dermatologist or come in for an appointment here as he does have Lupus confirmed by biopsy. Patient is on PLQ 200 mg pos BID. Please advise.

## 2019-07-13 NOTE — Telephone Encounter (Signed)
[name] left a voicemail stating [name]'s Lupus is affecting his skin and is requesting a return call to let her know if they should schedule an appointment with Dr. Estanislado Pandy or with his dermatologist.  Please return call at [phone]

## 2019-07-13 NOTE — Telephone Encounter (Signed)
Spoke with patient's wife and scheduled patient to be seen 07/14/19 at 9:30 am. Advised her that he will also need to be seen at dermatology as well. She states she will call as soon as we get off the phone.

## 2019-07-14 ENCOUNTER — Encounter: Payer: Self-pay | Admitting: Rheumatology

## 2019-07-14 ENCOUNTER — Ambulatory Visit (INDEPENDENT_AMBULATORY_CARE_PROVIDER_SITE_OTHER): Payer: Medicare Other | Admitting: Rheumatology

## 2019-07-14 ENCOUNTER — Other Ambulatory Visit: Payer: Self-pay

## 2019-07-14 VITALS — BP 125/67 | HR 80 | Resp 14 | Ht 68.0 in | Wt 192.0 lb

## 2019-07-14 DIAGNOSIS — M51369 Other intervertebral disc degeneration, lumbar region without mention of lumbar back pain or lower extremity pain: Secondary | ICD-10-CM

## 2019-07-14 DIAGNOSIS — M5136 Other intervertebral disc degeneration, lumbar region: Secondary | ICD-10-CM

## 2019-07-14 DIAGNOSIS — M19041 Primary osteoarthritis, right hand: Secondary | ICD-10-CM | POA: Diagnosis not present

## 2019-07-14 DIAGNOSIS — Z8659 Personal history of other mental and behavioral disorders: Secondary | ICD-10-CM

## 2019-07-14 DIAGNOSIS — I6523 Occlusion and stenosis of bilateral carotid arteries: Secondary | ICD-10-CM | POA: Diagnosis not present

## 2019-07-14 DIAGNOSIS — M3219 Other organ or system involvement in systemic lupus erythematosus: Secondary | ICD-10-CM

## 2019-07-14 DIAGNOSIS — M797 Fibromyalgia: Secondary | ICD-10-CM | POA: Diagnosis not present

## 2019-07-14 DIAGNOSIS — Z8679 Personal history of other diseases of the circulatory system: Secondary | ICD-10-CM

## 2019-07-14 DIAGNOSIS — Z79899 Other long term (current) drug therapy: Secondary | ICD-10-CM | POA: Diagnosis not present

## 2019-07-14 DIAGNOSIS — Z8719 Personal history of other diseases of the digestive system: Secondary | ICD-10-CM

## 2019-07-14 DIAGNOSIS — Z96653 Presence of artificial knee joint, bilateral: Secondary | ICD-10-CM

## 2019-07-14 DIAGNOSIS — M19042 Primary osteoarthritis, left hand: Secondary | ICD-10-CM

## 2019-07-14 MED ORDER — PREDNISONE 5 MG PO TABS: ORAL_TABLET | ORAL | 0 refills | Status: AC

## 2019-07-14 NOTE — Patient Instructions (Addendum)
Start taking Pepcid (famotidine) 20 mg twice daily to replace omeprazole.  Start taking prednisone 20 mg(4 tablets) taper. Taper by 5 mg (1 tablet) every 4 days.

## 2019-07-15 LAB — COMPLETE METABOLIC PANEL WITH GFR
AG Ratio: 1.9 (calc) (ref 1.0–2.5)
ALT: 22 U/L (ref 9–46)
AST: 19 U/L (ref 10–35)
Albumin: 4.1 g/dL (ref 3.6–5.1)
Alkaline phosphatase (APISO): 73 U/L (ref 35–144)
BUN/Creatinine Ratio: 26 (calc) — ABNORMAL HIGH (ref 6–22)
BUN: 18 mg/dL (ref 7–25)
CO2: 27 mmol/L (ref 20–32)
Calcium: 8.8 mg/dL (ref 8.6–10.3)
Chloride: 106 mmol/L (ref 98–110)
Creat: 0.69 mg/dL — ABNORMAL LOW (ref 0.70–1.18)
GFR, Est African American: 111 mL/min/{1.73_m2} (ref >=60)
GFR, Est Non African American: 96 mL/min/{1.73_m2} (ref >=60)
Globulin: 2.2 g/dL (calc) (ref 1.9–3.7)
Glucose, Bld: 136 mg/dL — ABNORMAL HIGH (ref 65–99)
Potassium: 4.1 mmol/L (ref 3.5–5.3)
Sodium: 141 mmol/L (ref 135–146)
Total Bilirubin: 0.6 mg/dL (ref 0.2–1.2)
Total Protein: 6.3 g/dL (ref 6.1–8.1)

## 2019-07-15 LAB — CBC WITH DIFFERENTIAL/PLATELET
Absolute Monocytes: 385 cells/uL (ref 200–950)
Basophils Absolute: 39 cells/uL (ref 0–200)
Basophils Relative: 1.1 %
Eosinophils Absolute: 189 cells/uL (ref 15–500)
Eosinophils Relative: 5.4 %
HCT: 43.9 % (ref 38.5–50.0)
Hemoglobin: 14.7 g/dL (ref 13.2–17.1)
Lymphs Abs: 910 cells/uL (ref 850–3900)
MCH: 28.9 pg (ref 27.0–33.0)
MCHC: 33.5 g/dL (ref 32.0–36.0)
MCV: 86.4 fL (ref 80.0–100.0)
MPV: 9.7 fL (ref 7.5–12.5)
Monocytes Relative: 11 %
Neutro Abs: 1978 cells/uL (ref 1500–7800)
Neutrophils Relative %: 56.5 %
Platelets: 213 10*3/uL (ref 140–400)
RBC: 5.08 10*6/uL (ref 4.20–5.80)
RDW: 13.7 % (ref 11.0–15.0)
Total Lymphocyte: 26 %
WBC: 3.5 10*3/uL — ABNORMAL LOW (ref 3.8–10.8)

## 2019-07-15 LAB — SEDIMENTATION RATE: Sed Rate: 6 mm/h (ref 0–20)

## 2019-07-15 LAB — ANTI-DNA ANTIBODY, DOUBLE-STRANDED: ds DNA Ab: 7 IU/mL — ABNORMAL HIGH

## 2019-07-15 LAB — C3 AND C4
C3 Complement: 124 mg/dL (ref 82–185)
C4 Complement: 20 mg/dL (ref 15–53)

## 2019-07-15 NOTE — Progress Notes (Signed)
Labs are stable.  No change in treatment needed.  He should follow-up with dermatologist as planned.

## 2019-07-29 ENCOUNTER — Other Ambulatory Visit: Payer: Self-pay | Admitting: Rheumatology

## 2019-07-29 DIAGNOSIS — M359 Systemic involvement of connective tissue, unspecified: Secondary | ICD-10-CM

## 2019-07-29 NOTE — Telephone Encounter (Signed)
Last Visit: 07/14/19 Next Visit: 12/16/19 Labs: 07/14/19 stable Eye exam:  04/29/19 WNL  Okay to refill per Dr. Estanislado Pandy

## 2019-08-09 ENCOUNTER — Other Ambulatory Visit: Payer: Self-pay | Admitting: Physician Assistant

## 2019-08-15 DIAGNOSIS — M329 Systemic lupus erythematosus, unspecified: Secondary | ICD-10-CM | POA: Insufficient documentation

## 2019-08-19 ENCOUNTER — Other Ambulatory Visit: Payer: Self-pay | Admitting: Family Medicine

## 2019-08-19 DIAGNOSIS — Z136 Encounter for screening for cardiovascular disorders: Secondary | ICD-10-CM

## 2019-08-30 ENCOUNTER — Ambulatory Visit
Admission: RE | Admit: 2019-08-30 | Discharge: 2019-08-30 | Disposition: A | Discharge status: Home / Self Care | Payer: Medicare Other | Source: Ambulatory Visit | Attending: Family Medicine | Admitting: Family Medicine

## 2019-08-30 DIAGNOSIS — Z136 Encounter for screening for cardiovascular disorders: Secondary | ICD-10-CM

## 2019-09-14 ENCOUNTER — Ambulatory Visit: Payer: Medicare Other | Admitting: Physician Assistant

## 2019-09-28 ENCOUNTER — Encounter: Payer: Self-pay | Admitting: *Deleted

## 2019-10-19 ENCOUNTER — Other Ambulatory Visit: Payer: Self-pay | Admitting: Physician Assistant

## 2019-10-19 DIAGNOSIS — D229 Melanocytic nevi, unspecified: Secondary | ICD-10-CM

## 2019-10-19 HISTORY — DX: Melanocytic nevi, unspecified: D22.9

## 2019-10-22 ENCOUNTER — Other Ambulatory Visit: Payer: Self-pay | Admitting: Rheumatology

## 2019-10-22 DIAGNOSIS — M359 Systemic involvement of connective tissue, unspecified: Secondary | ICD-10-CM

## 2019-10-24 NOTE — Telephone Encounter (Signed)
Last Visit: 07/14/2019 Next Visit: 12/16/2019 Labs: 07/14/2019 Labs are stable.  Eye exam: 04/29/2019   Okay to refill per Dr. Estanislado Pandy.

## 2019-12-08 NOTE — Progress Notes (Deleted)
Virtual Visit via Telephone Note  I connected with David Weaver on 12/08/19 at 10:45 AM EST by telephone and verified that I am speaking with the correct person using two identifiers.  Location: Patient: *** Provider: ***   I discussed the limitations, risks, security and privacy concerns of performing an evaluation and management service by telephone and the availability of in person appointments. I also discussed with the patient that there may be a patient responsible charge related to this service. The patient expressed understanding and agreed to proceed.   History of Present Illness: David Weaver is a 69 y.o. male with history of systemic and discoid lupus.  ROS   Observations/Objective:   Physical Exam Assessment and Plan: Visit Diagnoses: Other organ or system involvement in systemic lupus erythematosus ([name]) - +ANA, +Ro, +La. history of DLE and sicca.   High risk medication use -  PLQ 200 mg twice daily.  PLQ Eye Exam 04/29/19  Fibromyalgia - trazodone 50 mg to 100 mg by mouth at bedtime to help him sleep.  He continues to take Cymbalta 60 mg by mouth daily.   Primary osteoarthritis of both hands   History of total knee replacement, bilateral   DDD (degenerative disc disease), lumbar   History of depression   History of gastroesophageal reflux (GERD)   History of hypertension -his blood pressure is well controlled.   Follow Up Instructions:    I discussed the assessment and treatment plan with the patient. The patient was provided an opportunity to ask questions and all were answered. The patient agreed with the plan and demonstrated an understanding of the instructions.   The patient was advised to call back or seek an in-person evaluation if the symptoms worsen or if the condition fails to improve as anticipated.  I provided *** minutes of non-face-to-face time during this encounter.   Earnestine Mealing, CMA

## 2019-12-15 ENCOUNTER — Encounter: Payer: Self-pay | Admitting: Rheumatology

## 2019-12-16 ENCOUNTER — Telehealth: Payer: Medicare Other | Admitting: Rheumatology

## 2020-01-08 ENCOUNTER — Ambulatory Visit: Payer: Medicare Other

## 2020-01-23 ENCOUNTER — Ambulatory Visit: Payer: Medicare Other

## 2020-01-24 NOTE — Progress Notes (Signed)
Office Visit Note  Patient: David Weaver             Date of Birth: 20-Mar-1951           MRN: [id number]             PCP: Wardell Honour, MD Referring: Horald Pollen, * Visit Date: 01/30/2020 Occupation: @GUAROCC @  Subjective:  Lower back pain    History of Present Illness: David Weaver is a 69 y.o. male with history of systemic lupus erythematosus, osteoarthritis, and fibromyalgia.  He is taking plaquenil 200 mg 1 tablet by mouth twice daily.  He denies any signs or symptoms of a lupus flare recently.  He states has occasional facial rash and uses topical agents which resolved his symptoms.  He continues to have chronic sicca symptoms which have been tolerable.  He denies any sores in his mouth or nose.  He denies any enlarged lymph nodes.  He continues to have trigger fingers of the left fourth and fifth digits.  He has ongoing pain in both hands due to osteoarthritis.  He denies any joint swelling.  He states that he experiences intermittent pain in both knees which are replaced.  He states that his fibromyalgia pain has been tolerable.He is taking cymbalta 60 mg BID and takes robaxin 500 mg every 8 hours as needed for management of fibromyalgia.  He presents today with increased lower back pain which started 1 month ago.  He is experiencing symptoms of radiculopathy down the left lower extremity.  He has an upcoming with appointment with Dr. Hardin Negus and plans on proceeding with an MRI of the lumbar spine.  Activities of Daily Living:  Patient reports morning stiffness for 30 minutes.   Patient Denies nocturnal pain.  Difficulty dressing/grooming: Reports Difficulty climbing stairs: Denies Difficulty getting out of chair: Denies Difficulty using hands for taps, buttons, cutlery, and/or writing: Reports  Review of Systems  Constitutional: Positive for fatigue. Negative for night sweats.  HENT: Positive for mouth dryness. Negative for mouth sores and nose dryness.   Eyes:  Positive for dryness. Negative for redness.  Respiratory: Negative for cough, hemoptysis, shortness of breath and difficulty breathing.   Cardiovascular: Negative for chest pain, palpitations, hypertension, irregular heartbeat and swelling in legs/feet.  Gastrointestinal: Positive for diarrhea. Negative for blood in stool and constipation.  Endocrine: Negative for increased urination.  Genitourinary: Negative for difficulty urinating and painful urination.  Musculoskeletal: Positive for arthralgias, joint pain, joint swelling and morning stiffness. Negative for myalgias, muscle weakness, muscle tenderness and myalgias.  Skin: Positive for rash. Negative for color change, hair loss, nodules/bumps, skin tightness, ulcers and sensitivity to sunlight.  Allergic/Immunologic: Negative for susceptible to infections.  Neurological: Negative for fainting, headaches, memory loss, night sweats and weakness.  Hematological: Negative for swollen glands.  Psychiatric/Behavioral: Negative for depressed mood, confusion and sleep disturbance. The patient is not nervous/anxious.     PMFS History:  Patient Active Problem List   Diagnosis Date Noted  . Nausea without vomiting 08/04/2018  . Excessive sweating 08/04/2018  . Systemic involvement of connective tissue ([place]) 06/06/2018  . Fibromyalgia 04/13/2018  . History of total knee replacement, bilateral 04/13/2018  . DDD (degenerative disc disease), lumbar 04/13/2018  . High risk medication use 03/06/2017  . Paroxysmal atrial fibrillation ([place]) 02/19/2016  . Chronic pain syndrome 02/12/2015  . Primary osteoarthritis of both hands   . Esophageal reflux   . Autoimmune disease ([place])   . Hearing loss   .  Essential hypertension, benign 11/01/2012  . Other abnormal glucose 11/01/2012  . Depression 11/01/2012  . Insomnia 11/01/2012    Past Medical History:  Diagnosis Date  . Alcohol abuse, daily use   . Allergic rhinitis, cause unspecified   . Dysthymic  disorder   . Esophageal reflux   . Essential hypertension, benign   . Fibromyalgia    followed by [name] every six months.  . Fibromyositis   . Impotence of organic origin   . Insomnia, unspecified   . Left knee DJD   . Lupus erythematosus   . Osteoarthrosis, unspecified whether generalized or localized, lower leg   . Other abnormal glucose   . Other specified disorder of male genital organs(608.89)   . Systemic lupus erythematosus ([place])   . Unspecified hearing loss   . Unspecified vitamin D deficiency     Family History  Problem Relation Age of Onset  . Alzheimer's disease Mother   . Osteoporosis Mother   . Heart disease Father 79       AMI  . Alcohol abuse Father   . Cancer Brother        colon cancer  . Diabetes Brother   . Kidney failure Brother    Past Surgical History:  Procedure Laterality Date  . CARPAL TUNNEL RELEASE     bilateral  . CHOLECYSTECTOMY    . COLONOSCOPY  03/01/2014   two polyps; Dr. Benson Norway.  Repeat 5 years.  [name] GANGLION CYST EXCISION  1980   Left wrist  . JOINT REPLACEMENT  01/2013   right knee, left knee. [facility].  [name] KNEE ARTHROPLASTY    . KNEE ARTHROSCOPY  2008   Right  . lumbar spine epidural  2009   [facility]  . ROTATOR CUFF REPAIR  10/2008   right  . TOTAL KNEE ARTHROPLASTY  12/27/2012   Procedure: TOTAL KNEE ARTHROPLASTY;  Surgeon: [name], MD;  Location: [facility];  Service: Orthopedics;  Laterality: Right;  RIGHT ARTHROPLASTY KNEE MEDIAL AND LATERAL COMPARTMENTS WITH PATELLA RESURFACING, RIGHT TOTAL KNEE REPLACEMENT  . TOTAL KNEE ARTHROPLASTY Left 01/31/2013   Procedure: TOTAL KNEE ARTHROPLASTY;  Surgeon: [name], MD;  Location: [facility];  Service: Orthopedics;  Laterality: Left;  . TOTAL SHOULDER ARTHROPLASTY     Social History   Social History Narrative   Guns in the home stored in locked cabinet.       Caffeine use: 1 serving/ day.     Marital status:  Married x 25 years, third marriage, happily married.       Children: one son (81); one step-daughter; two grandsons      Lives: with wife. Mother-in-law going to move in in 2017.      Employment:  Works at [employer] on [employer]; [employer] work; happy; 25 hours per week.      Tobacco: none currently; quit 2013.  Smoked x 40 years.      Alcohol:  2-6 beers per night in 2018; much heavier use in fifties.      Drugs: none      Exercise:  None in 2017.      Seatbelt: 100%      ADLs: independent with ADLs in 2017; no assistant devices      Advanced Directives: none in 2017; FULL CODE in 2017; no prolonged measures   Immunization History  Administered Date(s) Administered  . Influenza, Seasonal, Injecte, Preservative Fre 11/01/2012  . Influenza,inj,Quad PF,6+ Mos 11/21/2013, 10/11/2014, 10/03/2015, 09/02/2016, 09/07/2017  . Influenza-Unspecified  08/20/2009, 08/19/2010, 08/12/2011  . Pneumococcal Conjugate-13 02/12/2015  . Pneumococcal Polysaccharide-23 02/19/2016  . Pneumococcal-Unspecified 03/03/2008  . Td 07/01/2001  . Tdap 02/03/2012  . Zoster 08/12/2011     Objective: Vital Signs: BP 112/74 (BP Location: Left Arm, Patient Position: Sitting, Cuff Size: Normal)   Pulse 61   Resp 15   Ht 5\' 7"  (1.702 m)   Wt 197 lb 12.8 oz (89.7 kg)   BMI 30.98 kg/m    Physical Exam Vitals and nursing note reviewed.  Constitutional:      Appearance: He is well-developed.  HENT:     Head: Normocephalic and atraumatic.  Eyes:     Conjunctiva/sclera: Conjunctivae normal.     Pupils: Pupils are equal, round, and reactive to light.  Pulmonary:     Effort: Pulmonary effort is normal.  Abdominal:     General: Bowel sounds are normal.     Palpations: Abdomen is soft.  Musculoskeletal:     Cervical back: Normal range of motion and neck supple.  Skin:    General: Skin is warm and dry.     Capillary Refill: Capillary refill takes less than 2 seconds.  Neurological:     Mental Status: He is alert and oriented to person, place, and time.  Psychiatric:         Behavior: Behavior normal.      Musculoskeletal Exam: C-spine limited range of motion with discomfort.  He has limited range of motion of the lumbar spine with pain.  He has tenderness over the left SI joint.  Shoulder joints, elbow joints, wrist joints, MCPs, PIPs and DIPs good range of motion with no synovitis.  He has severe PIP and DIP synovial thickening consistent with osteoarthritis of both hands.  Trigger fingers of the left fourth and fifth digits.  Hip joints have good range of motion with no discomfort at this time.  Knee replacements have good flexion extension with mild warmth but no effusion noted.  Ankle joints have good range of motion no tenderness or synovitis.  CDAI Exam: CDAI Score: -- Patient Global: --; Provider Global: -- Swollen: --; Tender: -- Joint Exam 01/30/2020   No joint exam has been documented for this visit   There is currently no information documented on the homunculus. Go to the Rheumatology activity and complete the homunculus joint exam.  Investigation: No additional findings.  Imaging: No results found.  Recent Labs: Lab Results  Component Value Date   WBC 3.5 (L) 07/14/2019   HGB 14.7 07/14/2019   PLT 213 07/14/2019   NA 141 07/14/2019   K 4.1 07/14/2019   CL 106 07/14/2019   CO2 27 07/14/2019   GLUCOSE 136 (H) 07/14/2019   BUN 18 07/14/2019   CREATININE 0.69 (L) 07/14/2019   BILITOT 0.6 07/14/2019   ALKPHOS 94 08/04/2018   AST 19 07/14/2019   ALT 22 07/14/2019   PROT 6.3 07/14/2019   ALBUMIN 4.3 08/04/2018   CALCIUM 8.8 07/14/2019   GFRAA 111 07/14/2019    Speciality Comments: PLQ Eye Exam 04/29/19  @ [facility] follow in 1 year  Procedures:  No procedures performed Allergies: Patient has no known allergies.   Assessment / Plan:     Visit Diagnoses: Other organ or system involvement in systemic lupus erythematosus ([name]) -  +ANA, +Ro, +La. history of DLE and sicca: He has not had any signs or symptoms of a systemic lupus  flare recently.  He is clinically doing well on Plaquenil 200 mg 1  tablet by mouth twice daily.  He has no synovitis on exam today.  He has chronic pain and stiffness in both hands due to underlying osteoarthritic changes.  He experiences occasional rashes on his face which resolved with topical agents.  He has not had any recent oral nasal ulcerations.  He has chronic sicca symptoms which have been tolerable.  No enlarged lymph nodes.  He has not had any symptoms of Raynaud's recently.  He is due to update autoimmune lab work today.  He will continue taking Plaquenil as prescribed.  He was advised to notify us if he develops signs or symptoms of a flare.  Follow-up in the office in 5 months.- Plan: CBC with Differential/Platelet, COMPLETE METABOLIC PANEL WITH GFR, Urinalysis, Routine w reflex microscopic, Anti-DNA antibody, double-stranded, C3 and C4, Sedimentation rate  High risk medication use - PLQ 200 mg 1 tablet by mouth twice daily.  Eye Exam 04/29/19.  He is due to update Plaquenil eye exam.  We discussed the risks of not having a Plaquenil eye exam on a yearly basis as recommended.  He will schedule appointment soon as possible.  CBC and CMP will be drawn today to monitor for drug toxicity.- Plan: CBC with Differential/Platelet, COMPLETE METABOLIC PANEL WITH GFR  Fibromyalgia -he has generalized hyperalgesia and positive tender points on exam.  Overall her fibromyalgia has been tolerable recently.  Presents today with trapezius muscle tension and muscle tenderness bilaterally.  He is also having increased lower back pain and left-sided radiculopathy.  He continues to see Dr. Hardin Negus his pain management specialist.  He is taking Cymbalta 60 mg twice daily and Robaxin 500 mg every 8 hours as needed for muscle spasms.  This has been an effective combination of medications.  We discussed the importance of regular exercise and good sleep hygiene.  Primary osteoarthritis of both hands: He has severe PIP and  DIP synovial thickening consistent with osteoarthritis of both hands.  No tenderness or synovitis was noted today.  Joint protection and muscle strengthening were discussed.  History of total knee replacement, bilateral: Chronic pain.  He has good range of motion of both knees which are replaced.  Mild warmth but no effusion was noted.  DDD (degenerative disc disease), lumbar: Acute on chronic pain.  He has been experiencing increased lower back pain for the past 1 month.  He has not had any recent changes in activities or aggravating factors.  He is experiencing left-sided radiculopathy.  He has tenderness over the left SI joint on exam.  X-rays of the lumbar spine were obtained on 06/10/2017 which revealed multilevel osteoarthritic changes and posterior facet arthropathy, moderate to severe and more prominent to the lower lumbosacral spine.  He is followed by Dr. Hardin Negus at pain management.  He has had injections in the past.  He would like to proceed with scheduling an MRI of the lumbar spine and will discuss further at his upcoming visit with Dr. Hardin Negus.  He plans on continuing to take Percocet as needed for pain relief  Other medical conditions are listed as follows:  History of depression  History of hypertension  History of gastroesophageal reflux (GERD)  Orders: Orders Placed This Encounter  Procedures  . CBC with Differential/Platelet  . COMPLETE METABOLIC PANEL WITH GFR  . Urinalysis, Routine w reflex microscopic  . Anti-DNA antibody, double-stranded  . C3 and C4  . Sedimentation rate   No orders of the defined types were placed in this encounter.    Follow-Up Instructions:  Return in about 5 months (around 07/01/2020) for Systemic lupus erythematosus.   Ofilia Neas, PA-C   I examined and evaluated the patient with Hazel Sams PA.  Patient clinically appears to be doing well.  His lupus seems to be stable.  We will check autoimmune labs today.  He has been having  discomfort in his lower back.  Is been followed by Dr. Hardin Negus.  The plan of care was discussed as noted above.  Bo Merino, MD  Note - This record has been created using Editor, commissioning.  Chart creation errors have been sought, but may not always  have been located. Such creation errors do not reflect on  the standard of medical care.

## 2020-01-27 ENCOUNTER — Other Ambulatory Visit: Payer: Self-pay | Admitting: Rheumatology

## 2020-01-27 DIAGNOSIS — M359 Systemic involvement of connective tissue, unspecified: Secondary | ICD-10-CM

## 2020-01-27 NOTE — Telephone Encounter (Signed)
Last Visit: 07/14/2019 Next Visit: 01/30/2020 Labs: 07/14/2019 Labs are stable.  Eye exam: 04/29/2019   Patient advised he due to update his labs. Patient to update at his upcoming appointment on 01/30/20.  Okay to refill 30 day supply PLQ?

## 2020-01-27 NOTE — Telephone Encounter (Signed)
Ok to refill 30 day supply of PLQ.

## 2020-01-30 ENCOUNTER — Other Ambulatory Visit: Payer: Self-pay

## 2020-01-30 ENCOUNTER — Encounter: Payer: Self-pay | Admitting: Rheumatology

## 2020-01-30 ENCOUNTER — Ambulatory Visit (INDEPENDENT_AMBULATORY_CARE_PROVIDER_SITE_OTHER): Payer: Medicare Other | Admitting: Rheumatology

## 2020-01-30 VITALS — BP 112/74 | HR 61 | Resp 15 | Ht 67.0 in | Wt 197.8 lb

## 2020-01-30 DIAGNOSIS — M797 Fibromyalgia: Secondary | ICD-10-CM

## 2020-01-30 DIAGNOSIS — Z8719 Personal history of other diseases of the digestive system: Secondary | ICD-10-CM

## 2020-01-30 DIAGNOSIS — Z79899 Other long term (current) drug therapy: Secondary | ICD-10-CM | POA: Diagnosis not present

## 2020-01-30 DIAGNOSIS — M3219 Other organ or system involvement in systemic lupus erythematosus: Secondary | ICD-10-CM

## 2020-01-30 DIAGNOSIS — Z8659 Personal history of other mental and behavioral disorders: Secondary | ICD-10-CM

## 2020-01-30 DIAGNOSIS — M19042 Primary osteoarthritis, left hand: Secondary | ICD-10-CM

## 2020-01-30 DIAGNOSIS — M5136 Other intervertebral disc degeneration, lumbar region: Secondary | ICD-10-CM

## 2020-01-30 DIAGNOSIS — Z8679 Personal history of other diseases of the circulatory system: Secondary | ICD-10-CM

## 2020-01-30 DIAGNOSIS — M19041 Primary osteoarthritis, right hand: Secondary | ICD-10-CM

## 2020-01-30 DIAGNOSIS — Z96653 Presence of artificial knee joint, bilateral: Secondary | ICD-10-CM

## 2020-01-31 ENCOUNTER — Other Ambulatory Visit: Payer: Self-pay | Admitting: *Deleted

## 2020-01-31 DIAGNOSIS — R899 Unspecified abnormal finding in specimens from other organs, systems and tissues: Secondary | ICD-10-CM

## 2020-01-31 LAB — CBC WITH DIFFERENTIAL/PLATELET
Absolute Monocytes: 566 cells/uL (ref 200–950)
Basophils Absolute: 30 cells/uL (ref 0–200)
Basophils Relative: 0.8 %
Eosinophils Absolute: 304 cells/uL (ref 15–500)
Eosinophils Relative: 8 %
HCT: 42.2 % (ref 38.5–50.0)
Hemoglobin: 14.5 g/dL (ref 13.2–17.1)
Lymphs Abs: 1037 cells/uL (ref 850–3900)
MCH: 28.9 pg (ref 27.0–33.0)
MCHC: 34.4 g/dL (ref 32.0–36.0)
MCV: 84.1 fL (ref 80.0–100.0)
MPV: 10 fL (ref 7.5–12.5)
Monocytes Relative: 14.9 %
Neutro Abs: 1862 cells/uL (ref 1500–7800)
Neutrophils Relative %: 49 %
Platelets: 207 10*3/uL (ref 140–400)
RBC: 5.02 10*6/uL (ref 4.20–5.80)
RDW: 14.2 % (ref 11.0–15.0)
Total Lymphocyte: 27.3 %
WBC: 3.8 10*3/uL (ref 3.8–10.8)

## 2020-01-31 LAB — COMPLETE METABOLIC PANEL WITH GFR
AG Ratio: 2 (calc) (ref 1.0–2.5)
ALT: 164 U/L — ABNORMAL HIGH (ref 9–46)
AST: 188 U/L — ABNORMAL HIGH (ref 10–35)
Albumin: 4.1 g/dL (ref 3.6–5.1)
Alkaline phosphatase (APISO): 138 U/L (ref 35–144)
BUN: 19 mg/dL (ref 7–25)
CO2: 25 mmol/L (ref 20–32)
Calcium: 8.7 mg/dL (ref 8.6–10.3)
Chloride: 104 mmol/L (ref 98–110)
Creat: 0.72 mg/dL (ref 0.70–1.18)
GFR, Est African American: 109 mL/min/{1.73_m2} (ref >=60)
GFR, Est Non African American: 94 mL/min/{1.73_m2} (ref >=60)
Globulin: 2.1 g/dL (calc) (ref 1.9–3.7)
Glucose, Bld: 99 mg/dL (ref 65–99)
Potassium: 4.3 mmol/L (ref 3.5–5.3)
Sodium: 138 mmol/L (ref 135–146)
Total Bilirubin: 0.9 mg/dL (ref 0.2–1.2)
Total Protein: 6.2 g/dL (ref 6.1–8.1)

## 2020-01-31 LAB — URINALYSIS, ROUTINE W REFLEX MICROSCOPIC
Bilirubin Urine: NEGATIVE
Glucose, UA: NEGATIVE
Hgb urine dipstick: NEGATIVE
Ketones, ur: NEGATIVE
Leukocytes,Ua: NEGATIVE
Nitrite: NEGATIVE
Protein, ur: NEGATIVE
Specific Gravity, Urine: 1.021 (ref 1.001–1.03)
pH: 5.5 (ref 5.0–8.0)

## 2020-01-31 LAB — SEDIMENTATION RATE: Sed Rate: 9 mm/h (ref 0–20)

## 2020-01-31 LAB — C3 AND C4
C3 Complement: 128 mg/dL (ref 82–185)
C4 Complement: 19 mg/dL (ref 15–53)

## 2020-01-31 LAB — ANTI-DNA ANTIBODY, DOUBLE-STRANDED: ds DNA Ab: 6 IU/mL — ABNORMAL HIGH

## 2020-01-31 NOTE — Progress Notes (Signed)
DsDNA is 6-stable.  Labs are not consistent with an autoimmune disease flare.

## 2020-01-31 NOTE — Progress Notes (Signed)
LFTs are elevated.  Please notify patient and ask if the patient has had any recent medication changes.  Has been been taking NSAIDs, tylenol, or alcohol?   If he has not had any changes in medications and is not taking OTC products please refer the patient to Dr. Benson Norway for further evaluation.   Rest of CMP WNL.  CBC WNL.  UA normal. Complements WNL.  ESR WNL.

## 2020-02-09 ENCOUNTER — Other Ambulatory Visit: Payer: Self-pay | Admitting: Gastroenterology

## 2020-02-09 DIAGNOSIS — R7989 Other specified abnormal findings of blood chemistry: Secondary | ICD-10-CM

## 2020-02-13 ENCOUNTER — Other Ambulatory Visit: Payer: Self-pay

## 2020-02-13 ENCOUNTER — Ambulatory Visit
Admission: RE | Admit: 2020-02-13 | Discharge: 2020-02-13 | Disposition: A | Discharge status: Home / Self Care | Payer: Medicare Other | Source: Ambulatory Visit | Attending: Gastroenterology | Admitting: Gastroenterology

## 2020-02-13 DIAGNOSIS — R7989 Other specified abnormal findings of blood chemistry: Secondary | ICD-10-CM

## 2020-02-20 ENCOUNTER — Other Ambulatory Visit: Payer: Self-pay

## 2020-02-20 ENCOUNTER — Other Ambulatory Visit: Payer: Self-pay | Admitting: Rheumatology

## 2020-02-20 ENCOUNTER — Ambulatory Visit (INDEPENDENT_AMBULATORY_CARE_PROVIDER_SITE_OTHER): Payer: Medicare Other | Admitting: Physician Assistant

## 2020-02-20 ENCOUNTER — Encounter: Payer: Self-pay | Admitting: Physician Assistant

## 2020-02-20 DIAGNOSIS — M359 Systemic involvement of connective tissue, unspecified: Secondary | ICD-10-CM

## 2020-02-20 DIAGNOSIS — D485 Neoplasm of uncertain behavior of skin: Secondary | ICD-10-CM | POA: Diagnosis not present

## 2020-02-20 DIAGNOSIS — L820 Inflamed seborrheic keratosis: Secondary | ICD-10-CM | POA: Diagnosis not present

## 2020-02-20 NOTE — Patient Instructions (Signed)

## 2020-02-20 NOTE — Progress Notes (Addendum)
   Follow-Up Visit   Subjective  David Weaver is a 69 y.o. male who presents for the following: Procedure (Patient here today for widershave on right cheek. Per patient his wife wants dark spot on the right side of patients facr looked at, patient denies any bleeding, per patient it is painful at times.).   The following portions of the chart were reviewed this encounter and updated as appropriate: Tobacco  Allergies  Meds  Problems  Med Hx  Surg Hx  Fam Hx      Objective  Well appearing patient in no apparent distress; mood and affect are within normal limits.  All skin waist up examined. No atypical nevi noted at the time of the visit.  Objective  Right Cheek: Here for wider shave  Objective  Left Temple, Left Zygomatic Area, Right Temporal Scalp, Right Zygomatic Area: Erythematous stuck-on, waxy papule or plaque.   Assessment & Plan  Neoplasm of uncertain behavior of sebaceous gland Right Cheek  Epidermal / dermal shaving  Lesion length (cm):  1.2 Lesion width (cm):  0.2 Margin per side (cm):  0.1 Total excision diameter (cm):  1.4 Informed consent: discussed and consent obtained   Timeout: patient name, date of birth, surgical site, and procedure verified   Procedure prep:  Patient was prepped and draped in usual sterile fashion Prep type:  Chlorhexidine Anesthesia: the lesion was anesthetized in a standard fashion   Anesthetic:  1% lidocaine w/ epinephrine 1-100,000 local infiltration Instrument used: DermaBlade   Hemostasis achieved with: aluminum chloride   Outcome: patient tolerated procedure well   Post-procedure details: sterile dressing applied and wound care instructions given   Dressing type: petrolatum gauze, petrolatum and bandage    Specimen 1 - Surgical pathology Differential Diagnosis:sebaceous neoplasm Check Margins: No Previous BX: [id number]    Seborrheic keratosis, inflamed (4) Left Temple; Left Zygomatic Area; Right Zygomatic Area;  Right Temporal Scalp  Destruction of lesion - Left Temple, Left Zygomatic Area, Right Temporal Scalp, Right Zygomatic Area Complexity: simple   Destruction method: cryotherapy   Informed consent: discussed and consent obtained   Timeout:  patient name, date of birth, surgical site, and procedure verified Lesion destroyed using liquid nitrogen: Yes   Region frozen until ice ball extended beyond lesion: Yes   Cryotherapy cycles:  1 Outcome: patient tolerated procedure well with no complications   Post-procedure details: wound care instructions given

## 2020-02-20 NOTE — Telephone Encounter (Signed)
Last Visit: 01/30/20 Next Visit: 07/02/20 Labs: 01/30/20 LFTs are elevated. Rest of CMP WNL. CBC WNL. PLQ Eye Exam 04/29/19  Current Dose per office note on:  Plaquenil 200 mg 1 tablet by mouth twice daily.    Okay to refill per Dr. Estanislado Pandy

## 2020-02-21 ENCOUNTER — Encounter (HOSPITAL_COMMUNITY): Payer: Self-pay

## 2020-02-22 ENCOUNTER — Telehealth: Payer: Self-pay | Admitting: *Deleted

## 2020-02-22 NOTE — Telephone Encounter (Signed)
Left message for patient to call us for pathology results.  

## 2020-02-22 NOTE — Telephone Encounter (Signed)
-----   Message from Warren Danes, [place] sent at 02/22/2020 12:13 PM EDT ----- Skin , right cheek CONSISTENT WITH SEBACEOUS HYPERPLASIA, PROBABLE SURFACE OF LESION RTC PRN- 6 mo Fu

## 2020-02-22 NOTE — Telephone Encounter (Signed)
Patient wife, Jaymar Scarpaci, returning call.  Please call back on Kathy's cell [phone].  [name] authorizes [facility] to leave a detailed message if does not answer. Chart [id number]

## 2020-02-23 ENCOUNTER — Telehealth: Payer: Self-pay | Admitting: *Deleted

## 2020-02-23 NOTE — Telephone Encounter (Signed)
-----   Message from Warren Danes, [place] sent at 02/22/2020 12:13 PM EDT ----- Skin , right cheek CONSISTENT WITH SEBACEOUS HYPERPLASIA, PROBABLE SURFACE OF LESION RTC PRN- 6 mo Fu

## 2020-02-23 NOTE — Telephone Encounter (Signed)
Pathology results to patient wife [name]).

## 2020-03-09 ENCOUNTER — Ambulatory Visit (INDEPENDENT_AMBULATORY_CARE_PROVIDER_SITE_OTHER): Payer: Medicare Other | Admitting: Cardiovascular Disease

## 2020-03-09 ENCOUNTER — Encounter: Payer: Self-pay | Admitting: Cardiovascular Disease

## 2020-03-09 ENCOUNTER — Other Ambulatory Visit: Payer: Self-pay

## 2020-03-09 VITALS — BP 122/74 | HR 73 | Ht 67.0 in | Wt 197.8 lb

## 2020-03-09 DIAGNOSIS — I2510 Atherosclerotic heart disease of native coronary artery without angina pectoris: Secondary | ICD-10-CM | POA: Diagnosis not present

## 2020-03-09 DIAGNOSIS — I209 Angina pectoris, unspecified: Secondary | ICD-10-CM

## 2020-03-09 DIAGNOSIS — I10 Essential (primary) hypertension: Secondary | ICD-10-CM

## 2020-03-09 DIAGNOSIS — Z01812 Encounter for preprocedural laboratory examination: Secondary | ICD-10-CM | POA: Diagnosis not present

## 2020-03-09 DIAGNOSIS — I480 Paroxysmal atrial fibrillation: Secondary | ICD-10-CM

## 2020-03-09 DIAGNOSIS — R072 Precordial pain: Secondary | ICD-10-CM

## 2020-03-09 NOTE — Patient Instructions (Addendum)
Medication Instructions:  TAKE AN EXTRA METOPROLOL 2 HOURS PRIOR TO CT  *If you need a refill on your cardiac medications before your next appointment, please call your pharmacy*   Lab Work: BMET 1 WEEK PRIOR TO CT  If you have labs (blood work) drawn today and your tests are completely normal, you will receive your results only by: [name] MyChart Message (if you have MyChart) OR . A paper copy in the mail If you have any lab test that is abnormal or we need to change your treatment, we will call you to review the results.  Testing/Procedures: Your physician has requested that you have cardiac CT. Cardiac computed tomography (CT) is a painless test that uses an x-ray machine to take clear, detailed pictures of your heart. For further information please visit [email or web address]. Please follow instruction sheet as given.  Follow-Up: At [facility], you and your health needs are our priority.  As part of our continuing mission to provide you with exceptional heart care, we have created designated Provider Care Teams.  These Care Teams include your primary Cardiologist (physician) and Advanced Practice Providers (APPs -  Physician Assistants and Nurse Practitioners) who all work together to provide you with the care you need, when you need it.  We recommend signing up for the patient portal called "MyChart".  Sign up information is provided on this After Visit Summary.  MyChart is used to connect with patients for Virtual Visits (Telemedicine).  Patients are able to view lab/test results, encounter notes, upcoming appointments, etc.  Non-urgent messages can be sent to your provider as well.   To learn more about what you can do with MyChart, go to [email or web address].    Your next appointment:   4 month(s)  You will receive a reminder letter in the mail two months in advance. If you don't receive a letter, please call our office to schedule the follow-up appointment.  The format for your  next appointment:   In Person  Provider:   You may see Skeet Latch, MD or one of the following Advanced Practice Providers on your designated Care Team:    Kerin Ransom, PA-C  [place], [place]  Coletta Memos, [place]  Other Instructions  Your cardiac CT will be scheduled at one of the below locations:   [facility] [address] [place], [place] [address] [phone]  [facility] [address] [place], [place] [address] [phone]  If scheduled at [facility], please arrive at the [facility] main entrance of [facility] 30 minutes prior to test start time. Proceed to the [facility] Radiology Department (first floor) to check-in and test prep.  If scheduled at [facility], please arrive 15 mins early for check-in and test prep.  Please follow these instructions carefully (unless otherwise directed):  Hold all erectile dysfunction medications at least 3 days (72 hrs) prior to test.  On the Night Before the Test: . Be sure to Drink plenty of water. . Do not consume any caffeinated/decaffeinated beverages or chocolate 12 hours prior to your test. . Do not take any antihistamines 12 hours prior to your test. . If you take Metformin do not take 24 hours prior to test. . If the patient has contrast allergy: ? Patient will need a prescription for Prednisone and very clear instructions (as follows): 1. Prednisone 50 mg - take 13 hours prior to test 2. Take another Prednisone 50 mg  7 hours prior to test 3. Take another Prednisone 50 mg 1 hour prior to test 4. Take Benadryl 50 mg 1 hour prior to test . Patient must complete all four doses of above prophylactic medications. . Patient will need a ride after test due to Benadryl.  On the Day of the Test: . Drink plenty of water. Do not drink any water within one hour of the test. . Do not eat any food 4 hours  prior to the test. . You may take your regular medications prior to the test.  . Take metoprolol (Lopressor) two hours prior to test. . HOLD Furosemide/Hydrochlorothiazide morning of the test. . FEMALES- please wear underwire-free bra if available       After the Test: . Drink plenty of water. . After receiving IV contrast, you may experience a mild flushed feeling. This is normal. . On occasion, you may experience a mild rash up to 24 hours after the test. This is not dangerous. If this occurs, you can take Benadryl 25 mg and increase your fluid intake. . If you experience trouble breathing, this can be serious. If it is severe call [phone] IMMEDIATELY. If it is mild, please call our office. . If you take any of these medications: Glipizide/Metformin, Avandament, Glucavance, please do not take 48 hours after completing test unless otherwise instructed.   Once we have confirmed authorization from your insurance company, we will call you to set up a date and time for your test.   For non-scheduling related questions, please contact the cardiac imaging nurse navigator should you have any questions/concerns: Marchia Bond, RN Navigator Cardiac Imaging [facility] [phone] office  For scheduling needs, including cancellations and rescheduling, please call [phone].    Cardiac CT Angiogram A cardiac CT angiogram is a procedure to look at the heart and the area around the heart. It may be done to help find the cause of chest pains or other symptoms of heart disease. During this procedure, a substance called contrast dye is injected into the blood vessels in the area to be checked. A large X-ray machine, called a CT scanner, then takes detailed pictures of the heart and the surrounding area. The procedure is also sometimes called a coronary CT angiogram, coronary artery scanning, or CTA. A cardiac CT angiogram allows the health care provider to see how well blood is  flowing to and from the heart. The health care provider will be able to see if there are any problems, such as:  Blockage or narrowing of the coronary arteries in the heart.  Fluid around the heart.  Signs of weakness or disease in the muscles, valves, and tissues of the heart. Tell a health care provider about:  Any allergies you have. This is especially important if you have had a previous allergic reaction to contrast dye.  All medicines you are taking, including vitamins, herbs, eye drops, creams, and over-the-counter medicines.  Any blood disorders you have.  Any surgeries you have had.  Any medical conditions you have.  Whether you are pregnant or may be pregnant.  Any anxiety disorders, chronic pain, or other conditions you have that may increase your stress or prevent you from lying still. What are the risks? Generally, this is a safe procedure. However, problems may occur, including:  Bleeding.  Infection.  Allergic reactions to medicines or dyes.  Damage to other structures or organs.  Kidney damage from the contrast dye that is used.  Increased risk  of cancer from radiation exposure. This risk is low. Talk with your health care provider about: ? The risks and benefits of testing. ? How you can receive the lowest dose of radiation. What happens before the procedure?  Wear comfortable clothing and remove any jewelry, glasses, dentures, and hearing aids.  Follow instructions from your health care provider about eating and drinking. This may include: ? For 12 hours before the procedure -- avoid caffeine. This includes tea, coffee, soda, energy drinks, and diet pills. Drink plenty of water or other fluids that do not have caffeine in them. Being well hydrated can prevent complications. ? For 4-6 hours before the procedure -- stop eating and drinking. The contrast dye can cause nausea, but this is less likely if your stomach is empty.  Ask your health care provider  about changing or stopping your regular medicines. This is especially important if you are taking diabetes medicines, blood thinners, or medicines to treat problems with erections (erectile dysfunction). What happens during the procedure?   Hair on your chest may need to be removed so that small sticky patches called electrodes can be placed on your chest. These will transmit information that helps to monitor your heart during the procedure.  An IV will be inserted into one of your veins.  You might be given a medicine to control your heart rate during the procedure. This will help to ensure that good images are obtained.  You will be asked to lie on an exam table. This table will slide in and out of the CT machine during the procedure.  Contrast dye will be injected into the IV. You might feel warm, or you may get a metallic taste in your mouth.  You will be given a medicine called nitroglycerin. This will relax or dilate the arteries in your heart.  The table that you are lying on will move into the CT machine tunnel for the scan.  The person running the machine will give you instructions while the scans are being done. You may be asked to: ? Keep your arms above your head. ? Hold your breath. ? Stay very still, even if the table is moving.  When the scanning is complete, you will be moved out of the machine.  The IV will be removed. The procedure may vary among health care providers and hospitals. What can I expect after the procedure? After your procedure, it is common to have:  A metallic taste in your mouth from the contrast dye.  A feeling of warmth.  A headache from the nitroglycerin. Follow these instructions at home:  Take over-the-counter and prescription medicines only as told by your health care provider.  If you are told, drink enough fluid to keep your urine pale yellow. This will help to flush the contrast dye out of your body.  Most people can return to their  normal activities right after the procedure. Ask your health care provider what activities are safe for you.  It is up to you to get the results of your procedure. Ask your health care provider, or the department that is doing the procedure, when your results will be ready.  Keep all follow-up visits as told by your health care provider. This is important. Contact a health care provider if:  You have any symptoms of allergy to the contrast dye. These include: ? Shortness of breath. ? Rash or hives. ? A racing heartbeat. Summary  A cardiac CT angiogram is a procedure to look  at the heart and the area around the heart. It may be done to help find the cause of chest pains or other symptoms of heart disease.  During this procedure, a large X-ray machine, called a CT scanner, takes detailed pictures of the heart and the surrounding area after a contrast dye has been injected into blood vessels in the area.  Ask your health care provider about changing or stopping your regular medicines before the procedure. This is especially important if you are taking diabetes medicines, blood thinners, or medicines to treat erectile dysfunction.  If you are told, drink enough fluid to keep your urine pale yellow. This will help to flush the contrast dye out of your body. This information is not intended to replace advice given to you by your health care provider. Make sure you discuss any questions you have with your health care provider. Document Revised: 07/13/2019 Document Reviewed: 07/13/2019 Elsevier Patient Education  [place].

## 2020-03-09 NOTE — H&P (View-Only) (Signed)
Cardiology Office Note   Date:  03/26/2020   ID:  Weaver, David 01/20/1951, MRN [id number]  PCP:  Wardell Honour, MD  Cardiologist:   Skeet Latch, MD   No chief complaint on file.    History of Present Illness: David Weaver is a 69 y.o. male with paroxysmal atrial fibrillation, hypertension, mild carotid stenosis, and lupus who presents for follow up.  [name] was seen in clinic on 08/07/15 for a presurgical assessment.  At that time he was cleared for shoulder surgery.  During surgery Mr. Wengerd went into atrial fibrillation with RVR.  He reportedly went back into sinus rhythm without intervention. He was started on Eliquis 5mg  daily and referred to follow up with cardiology.  He subsequently had a 30 day event monitor to determine whether he needed to continue on anticoagulation. The monitor did reveal recurrent atrial fibrillation so anticoagulation was continued.    [name] reported exertional chest pain.  He was referred for an exercise Myoview 01/2018 that revealed LVEF 46% with global hypokinesis.  There was no ischemia.   He achieved 10.5 METS on a Bruce protocol.  He was then referred for an echo that revealed LVEF 55 to 60% with grade 1 diastolic dysfunction.  At that appointment he also reported dizziness and had carotid Dopplers that revealed 1 to 39% ICA stenosis bilaterally.  Since his last appointment 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  He also had pain in his left arm at the same time.  The episode lasted for several minutes.  He denies any chest pain or pressure.  He has not been getting much exercise lately.  He tries to walk at times.  At times he feels like he is going in and out of atrial fibrillation.  The most recent episode occurred last month.  He reports that he does snore and does not feel well-rested when he awakens in the morning.  He denies daytime somnolence and does not think he has apneic  episodes.  He had both coronavirus vaccines.  He has been working with his PCP regarding elevated LFTs.  Acetaminophen was removed from his chronic pain medication and his LFTs did improve.  He was also treated with a course of steroids.  He has continued to take a statin.   Past Medical History:  Diagnosis Date  . Alcohol abuse, daily use   . Allergic rhinitis, cause unspecified   . Angina pectoris ([place]) 03/26/2020  . Atypical mole 10/19/2019   Right Cheek ([place])  . Dysthymic disorder   . Esophageal reflux   . Essential hypertension, benign   . Fibromyalgia    followed by [name] every six months.  . Fibromyositis   . Impotence of organic origin   . Insomnia, unspecified   . Left knee DJD   . Lupus erythematosus   . Osteoarthrosis, unspecified whether generalized or localized, lower leg   . Other abnormal glucose   . Other specified disorder of male genital organs(608.89)   . Systemic lupus erythematosus ([place])   . Unspecified hearing loss   . Unspecified vitamin D deficiency     Past Surgical History:  Procedure Laterality Date  . CARPAL TUNNEL RELEASE     bilateral  . CHOLECYSTECTOMY    . COLONOSCOPY  03/01/2014   two polyps; Dr. Benson [place].  Repeat 5 years.  [name] GANGLION CYST EXCISION  1980   Left wrist  .  JOINT REPLACEMENT  01/2013   right knee, left knee. [facility].  [name] KNEE ARTHROPLASTY    . KNEE ARTHROSCOPY  2008   Right  . lumbar spine epidural  2009   [facility]  . ROTATOR CUFF REPAIR  10/2008   right  . TOTAL KNEE ARTHROPLASTY  12/27/2012   Procedure: TOTAL KNEE ARTHROPLASTY;  Surgeon: [name], MD;  Location: [facility];  Service: Orthopedics;  Laterality: Right;  RIGHT ARTHROPLASTY KNEE MEDIAL AND LATERAL COMPARTMENTS WITH PATELLA RESURFACING, RIGHT TOTAL KNEE REPLACEMENT  . TOTAL KNEE ARTHROPLASTY Left 01/31/2013   Procedure: TOTAL KNEE ARTHROPLASTY;  Surgeon: [name], MD;  Location: [facility];  Service: Orthopedics;  Laterality: Left;  . TOTAL  SHOULDER ARTHROPLASTY       Current Outpatient Medications  Medication Sig Dispense Refill  . amLODipine (NORVASC) 5 MG tablet Take 1 tablet (5 mg total) by mouth daily. 90 tablet 1  . amphetamine-dextroamphetamine (ADDERALL) 20 MG tablet Take 20 mg by mouth every morning.    [name] atorvastatin (LIPITOR) 10 MG tablet Take 1 tablet (10 mg total) by mouth daily. 90 tablet 3  . Cholecalciferol (VITAMIN D PO) Take by mouth daily.    . colestipol (COLESTID) 1 g tablet Take 2 g by mouth 2 (two) times daily.  5  . Dextroamphetamine Sulfate 20 MG TABS Take by mouth daily.     . DULoxetine (CYMBALTA) 60 MG capsule Take 1 capsule (60 mg total) 2 (two) times daily by mouth. 180 capsule 3  . hydroxychloroquine (PLAQUENIL) 200 MG tablet TAKE 1 TABLET BY MOUTH TWICE A DAY 180 tablet 0  . losartan (COZAAR) 25 MG tablet Take 1 tablet (25 mg total) by mouth daily. 90 tablet 3  . methocarbamol (ROBAXIN) 500 MG tablet Take 500 mg by mouth every 8 (eight) hours as needed for muscle spasms.    . metoprolol succinate (TOPROL-XL) 100 MG 24 hr tablet Take 1 tablet (100 mg total) by mouth daily. Take with or immediately following a meal. 90 tablet 3  . mirtazapine (REMERON) 15 MG tablet TAKE 1 TABLET BY MOUTH EVERY DAY AT NIGHT    . ondansetron (ZOFRAN) 4 MG tablet TAKE 1 TABLET BY MOUTH EVERY 8 HOURS AS NEEDED FOR NAUSEA AND VOMITING 20 tablet 0  . Oxycodone HCl 10 MG TABS Take 10 mg by mouth every 6 (six) hours as needed.    . predniSONE (DELTASONE) 10 MG tablet Take 10 mg by mouth daily.    . rivaroxaban (XARELTO) 20 MG TABS tablet Take 1 tablet (20 mg total) by mouth daily with supper. 30 tablet 5   No current facility-administered medications for this visit.    Allergies:   Patient has no known allergies.    Social History:  The patient  reports that he quit smoking about 11 years ago. His smoking use included cigarettes. He has a 60.00 pack-year smoking history. He has never used smokeless tobacco. He reports  current alcohol use. He reports that he does not use drugs.   Family History:  The patient's family history includes Alcohol abuse in his father; Alzheimer's disease in his mother; Cancer in his brother; Diabetes in his brother; Heart disease (age of onset: 4) in his father; Kidney failure in his brother; Osteoporosis in his mother.    ROS:  Please see the history of present illness.   Otherwise, review of systems are positive for shoulder pain.   All other systems are reviewed and negative.  PHYSICAL EXAM: VS:  BP 122/74   Pulse 73   Ht 5\' 7"  (1.702 m)   Wt 197 lb 12.8 oz (89.7 kg)   SpO2 94%   BMI 30.98 kg/m  , BMI Body mass index is 30.98 kg/m. GENERAL:  Well appearing HEENT: Pupils equal round and reactive, fundi not visualized, oral mucosa unremarkable NECK:  No jugular venous distention, waveform within normal limits, carotid upstroke brisk and symmetric, no bruits LUNGS:  Clear to auscultation bilaterally HEART:  RRR.  PMI not displaced or sustained,S1 and S2 within normal limits, no S3, no S4, no clicks, no rubs, no murmurs ABD:  Flat, positive bowel sounds normal in frequency in pitch, no bruits, no rebound, no guarding, no midline pulsatile mass, no hepatomegaly, no splenomegaly EXT:  2 plus pulses throughout, no edema, no cyanosis no clubbing SKIN:  No rashes no nodules NEURO:  Cranial nerves II through XII grossly intact, motor grossly intact throughout PSYCH:  Cognitively intact, oriented to person place and time    EKG:  EKG is ordered today.  04/22/16: Sinus rhythm rate 64 bpm. Nonspecific T wave changes. 01/28/18: Sinus rhythm.  PACs. 8/15;16: Sinus rhythm 93 bpm.  Non-specific t wave changes. 04/05/14: Ectopic atrial rhythm. Non-specific inferior T wave changes. 03/09/20: Sinus rhythm.  Rate 73 bpm.  Lexiscan Myoview 02/10/18:  The left ventricular ejection fraction is mildly decreased (45-54%).  Nuclear stress EF: 46%. Mild generalized hypokinesis  There was no  ST segment deviation noted during stress.  The study is normal.  This is a low risk study. No ischemia. Prior ECHO 2016 - normal EF.  Echo 02/23/18: Study Conclusions  - Left ventricle: The cavity size was normal. Systolic function was   normal. The estimated ejection fraction was in the range of 55%   to 60%. Wall motion was normal; there were no regional wall   motion abnormalities. Doppler parameters are consistent with   abnormal left ventricular relaxation (grade 1 diastolic   dysfunction). - Left atrium: The atrium was mildly dilated.  Carotid Doppler 02/10/18: 1-39% ICA stenosis bilaterally  Recent Labs: 01/30/2020: ALT 164; BUN 19; Creat 0.72; Hemoglobin 14.5; Platelets 207; Potassium 4.3; Sodium 138    Lipid Panel    Component Value Date/Time   CHOL 177 01/18/2018 1016   TRIG 255 (H) 01/18/2018 1016   HDL 34 (L) 01/18/2018 1016   CHOLHDL 5.2 (H) 01/18/2018 1016   CHOLHDL 3.9 09/02/2016 1035   VLDL 43 (H) 09/02/2016 1035   LDLCALC 92 01/18/2018 1016      Wt Readings from Last 3 Encounters:  03/09/20 197 lb 12.8 oz (89.7 kg)  01/30/20 197 lb 12.8 oz (89.7 kg)  07/14/19 192 lb (87.1 kg)      Other studies Reviewed: Additional studies/ records that were reviewed today include:. Review of the above records demonstrates:  Please see elsewhere in the note.     ASSESSMENT AND PLAN:  # Exertional chest pain:  Mr. Cipres has not experienced any chest pain and his stress test was negative in 2019.  However he did have his episode of exertional shortness of breath and arm discomfort.  We will get a coronary CT-A to assess for obstructive coronary disease.    # Carotid stenosis: Mild 04/2019.  Repeat 03/2021.  Continue Xarelto and atorvastatin.  # Paroxysmal atrial fibrillation:   It is unclear whether he is having episodes of recurrent atrial fibrillation.  We discussed the idea of getting EKG sleep study given his snoring, awakening  tired, and palpitations that are  worse in the mornings.  However he wants to think about it at this time.  Continue Xarelto and metoprolol. This patients CHA2DS2-VASc Score and unadjusted Ischemic Stroke Rate (% per year) is equal to 2.2 % stroke rate/year from a score of 2  Above score calculated as 1 point each if present [CHF, HTN, DM, Vascular=MI/PAD/Aortic Plaque, Age if 65-74, or Male] Above score calculated as 2 points each if present [Age > 75, or Stroke/TIA/TE]  # Hypertension: BP well-controlled on amlodipine, losartan and metoprolol.  # Dizziness:Resolved.  Only mild carotid stenosis on Dopplers.    Current medicines are reviewed at length with the patient today.  The patient does not have concerns regarding medicines.  The following changes have been made:  no change  Labs/ tests ordered today include:  Orders Placed This Encounter  Procedures  . CT CORONARY MORPH W/CTA COR W/SCORE W/CA W/CM &/OR WO/CM  . CT CORONARY FRACTIONAL FLOW RESERVE DATA PREP  . CT CORONARY FRACTIONAL FLOW RESERVE FLUID ANALYSIS  . Basic metabolic panel  . EKG 12-Lead     Disposition:   FU with Dr. Jonelle Sidle C. [place] in 4 months.  Signed, Skeet Latch, MD  03/26/2020 2:00 PM    [place]

## 2020-03-09 NOTE — Progress Notes (Signed)
Cardiology Office Note   Date:  03/26/2020   ID:  David Weaver, David Weaver 13-Jan-1951, MRN [id number]  PCP:  Wardell Honour, MD  Cardiologist:   Skeet Latch, MD   No chief complaint on file.    History of Present Illness: David Weaver is a 69 y.o. male with paroxysmal atrial fibrillation, hypertension, mild carotid stenosis, and lupus who presents for follow up.  David Weaver was seen in clinic on 08/07/15 for a presurgical assessment.  At that time he was cleared for shoulder surgery.  During surgery David Weaver went into atrial fibrillation with RVR.  He reportedly went back into sinus rhythm without intervention. He was started on Eliquis 5mg  daily and referred to follow up with cardiology.  He subsequently had a 30 day event monitor to determine whether he needed to continue on anticoagulation. The monitor did reveal recurrent atrial fibrillation so anticoagulation was continued.    David Weaver reported exertional chest pain.  He was referred for an exercise Myoview 01/2018 that revealed LVEF 46% with global hypokinesis.  There was no ischemia.   He achieved 10.5 METS on a Bruce protocol.  He was then referred for an echo that revealed LVEF 55 to 60% with grade 1 diastolic dysfunction.  At that appointment he also reported dizziness and had carotid Dopplers that revealed 1 to 39% ICA stenosis bilaterally.  Since his last appointment David Weaver had one episode of nausea that occurred when walking back from his shop to his house.  He also had pain in his left arm at the same time.  The episode lasted for several minutes.  He denies any chest pain or pressure.  He has not been getting much exercise lately.  He tries to walk at times.  At times he feels like he is going in and out of atrial fibrillation.  The most recent episode occurred last month.  He reports that he does snore and does not feel well-rested when he awakens in the morning.  He denies daytime somnolence and does not think he has apneic  episodes.  He had both coronavirus vaccines.  He has been working with his PCP regarding elevated LFTs.  Acetaminophen was removed from his chronic pain medication and his LFTs did improve.  He was also treated with a course of steroids.  He has continued to take a statin.   Past Medical History:  Diagnosis Date  . Alcohol abuse, daily use   . Allergic rhinitis, cause unspecified   . Angina pectoris ([place]) 03/26/2020  . Atypical mole 10/19/2019   Right Cheek ([place])  . Dysthymic disorder   . Esophageal reflux   . Essential hypertension, benign   . Fibromyalgia    followed by [name] every six months.  . Fibromyositis   . Impotence of organic origin   . Insomnia, unspecified   . Left knee DJD   . Lupus erythematosus   . Osteoarthrosis, unspecified whether generalized or localized, lower leg   . Other abnormal glucose   . Other specified disorder of male genital organs(608.89)   . Systemic lupus erythematosus ([place])   . Unspecified hearing loss   . Unspecified vitamin D deficiency     Past Surgical History:  Procedure Laterality Date  . CARPAL TUNNEL RELEASE     bilateral  . CHOLECYSTECTOMY    . COLONOSCOPY  03/01/2014   two polyps; Dr. Benson Norway.  Repeat 5 years.  [name] GANGLION CYST EXCISION  1980   Left wrist  .  JOINT REPLACEMENT  01/2013   right knee, left knee. [place].  [name] KNEE ARTHROPLASTY    . KNEE ARTHROSCOPY  2008   Right  . lumbar spine epidural  2009   [facility]  . ROTATOR CUFF REPAIR  10/2008   right  . TOTAL KNEE ARTHROPLASTY  12/27/2012   Procedure: TOTAL KNEE ARTHROPLASTY;  Surgeon: [name], MD;  Location: [facility];  Service: Orthopedics;  Laterality: Right;  RIGHT ARTHROPLASTY KNEE MEDIAL AND LATERAL COMPARTMENTS WITH PATELLA RESURFACING, RIGHT TOTAL KNEE REPLACEMENT  . TOTAL KNEE ARTHROPLASTY Left 01/31/2013   Procedure: TOTAL KNEE ARTHROPLASTY;  Surgeon: [name], MD;  Location: [facility];  Service: Orthopedics;  Laterality: Left;  . TOTAL  SHOULDER ARTHROPLASTY       Current Outpatient Medications  Medication Sig Dispense Refill  . amLODipine (NORVASC) 5 MG tablet Take 1 tablet (5 mg total) by mouth daily. 90 tablet 1  . amphetamine-dextroamphetamine (ADDERALL) 20 MG tablet Take 20 mg by mouth every morning.    [name] atorvastatin (LIPITOR) 10 MG tablet Take 1 tablet (10 mg total) by mouth daily. 90 tablet 3  . Cholecalciferol (VITAMIN D PO) Take by mouth daily.    . colestipol (COLESTID) 1 g tablet Take 2 g by mouth 2 (two) times daily.  5  . Dextroamphetamine Sulfate 20 MG TABS Take by mouth daily.     . DULoxetine (CYMBALTA) 60 MG capsule Take 1 capsule (60 mg total) 2 (two) times daily by mouth. 180 capsule 3  . hydroxychloroquine (PLAQUENIL) 200 MG tablet TAKE 1 TABLET BY MOUTH TWICE A DAY 180 tablet 0  . losartan (COZAAR) 25 MG tablet Take 1 tablet (25 mg total) by mouth daily. 90 tablet 3  . methocarbamol (ROBAXIN) 500 MG tablet Take 500 mg by mouth every 8 (eight) hours as needed for muscle spasms.    . metoprolol succinate (TOPROL-XL) 100 MG 24 hr tablet Take 1 tablet (100 mg total) by mouth daily. Take with or immediately following a meal. 90 tablet 3  . mirtazapine (REMERON) 15 MG tablet TAKE 1 TABLET BY MOUTH EVERY DAY AT NIGHT    . ondansetron (ZOFRAN) 4 MG tablet TAKE 1 TABLET BY MOUTH EVERY 8 HOURS AS NEEDED FOR NAUSEA AND VOMITING 20 tablet 0  . Oxycodone HCl 10 MG TABS Take 10 mg by mouth every 6 (six) hours as needed.    . predniSONE (DELTASONE) 10 MG tablet Take 10 mg by mouth daily.    . rivaroxaban (XARELTO) 20 MG TABS tablet Take 1 tablet (20 mg total) by mouth daily with supper. 30 tablet 5   No current facility-administered medications for this visit.    Allergies:   Patient has no known allergies.    Social History:  The patient  reports that he quit smoking about 11 years ago. His smoking use included cigarettes. He has a 60.00 pack-year smoking history. He has never used smokeless tobacco. He reports  current alcohol use. He reports that he does not use drugs.   Family History:  The patient's family history includes Alcohol abuse in his father; Alzheimer's disease in his mother; Cancer in his brother; Diabetes in his brother; Heart disease (age of onset: 30) in his father; Kidney failure in his brother; Osteoporosis in his mother.    ROS:  Please see the history of present illness.   Otherwise, review of systems are positive for shoulder pain.   All other systems are reviewed and negative.  PHYSICAL EXAM: VS:  BP 122/74   Pulse 73   Ht 5\' 7"  (1.702 m)   Wt 197 lb 12.8 oz (89.7 kg)   SpO2 94%   BMI 30.98 kg/m  , BMI Body mass index is 30.98 kg/m. GENERAL:  Well appearing HEENT: Pupils equal round and reactive, fundi not visualized, oral mucosa unremarkable NECK:  No jugular venous distention, waveform within normal limits, carotid upstroke brisk and symmetric, no bruits LUNGS:  Clear to auscultation bilaterally HEART:  RRR.  PMI not displaced or sustained,S1 and S2 within normal limits, no S3, no S4, no clicks, no rubs, no murmurs ABD:  Flat, positive bowel sounds normal in frequency in pitch, no bruits, no rebound, no guarding, no midline pulsatile mass, no hepatomegaly, no splenomegaly EXT:  2 plus pulses throughout, no edema, no cyanosis no clubbing SKIN:  No rashes no nodules NEURO:  Cranial nerves II through XII grossly intact, motor grossly intact throughout PSYCH:  Cognitively intact, oriented to person place and time    EKG:  EKG is ordered today.  04/22/16: Sinus rhythm rate 64 bpm. Nonspecific T wave changes. 01/28/18: Sinus rhythm.  PACs. 8/15;16: Sinus rhythm 93 bpm.  Non-specific t wave changes. 04/05/14: Ectopic atrial rhythm. Non-specific inferior T wave changes. 03/09/20: Sinus rhythm.  Rate 73 bpm.  Lexiscan Myoview 02/10/18:  The left ventricular ejection fraction is mildly decreased (45-54%).  Nuclear stress EF: 46%. Mild generalized hypokinesis  There was no  ST segment deviation noted during stress.  The study is normal.  This is a low risk study. No ischemia. Prior ECHO 2016 - normal EF.  Echo 02/23/18: Study Conclusions  - Left ventricle: The cavity size was normal. Systolic function was   normal. The estimated ejection fraction was in the range of 55%   to 60%. Wall motion was normal; there were no regional wall   motion abnormalities. Doppler parameters are consistent with   abnormal left ventricular relaxation (grade 1 diastolic   dysfunction). - Left atrium: The atrium was mildly dilated.  Carotid Doppler 02/10/18: 1-39% ICA stenosis bilaterally  Recent Labs: 01/30/2020: ALT 164; BUN 19; Creat 0.72; Hemoglobin 14.5; Platelets 207; Potassium 4.3; Sodium 138    Lipid Panel    Component Value Date/Time   CHOL 177 01/18/2018 1016   TRIG 255 (H) 01/18/2018 1016   HDL 34 (L) 01/18/2018 1016   CHOLHDL 5.2 (H) 01/18/2018 1016   CHOLHDL 3.9 09/02/2016 1035   VLDL 43 (H) 09/02/2016 1035   LDLCALC 92 01/18/2018 1016      Wt Readings from Last 3 Encounters:  03/09/20 197 lb 12.8 oz (89.7 kg)  01/30/20 197 lb 12.8 oz (89.7 kg)  07/14/19 192 lb (87.1 kg)      Other studies Reviewed: Additional studies/ records that were reviewed today include:. Review of the above records demonstrates:  Please see elsewhere in the note.     ASSESSMENT AND PLAN:  # Exertional chest pain:  Mr. Ahlborn has not experienced any chest pain and his stress test was negative in 2019.  However he did have his episode of exertional shortness of breath and arm discomfort.  We will get a coronary CT-A to assess for obstructive coronary disease.    # Carotid stenosis: Mild 04/2019.  Repeat 03/2021.  Continue Xarelto and atorvastatin.  # Paroxysmal atrial fibrillation:   It is unclear whether he is having episodes of recurrent atrial fibrillation.  We discussed the idea of getting EKG sleep study given his snoring, awakening  tired, and palpitations that are  worse in the mornings.  However he wants to think about it at this time.  Continue Xarelto and metoprolol. This patients CHA2DS2-VASc Score and unadjusted Ischemic Stroke Rate (% per year) is equal to 2.2 % stroke rate/year from a score of 2  Above score calculated as 1 point each if present [CHF, HTN, DM, Vascular=MI/PAD/Aortic Plaque, Age if 65-74, or Male] Above score calculated as 2 points each if present [Age > 75, or Stroke/TIA/TE]  # Hypertension: BP well-controlled on amlodipine, losartan and metoprolol.  # Dizziness:Resolved.  Only mild carotid stenosis on Dopplers.    Current medicines are reviewed at length with the patient today.  The patient does not have concerns regarding medicines.  The following changes have been made:  no change  Labs/ tests ordered today include:  Orders Placed This Encounter  Procedures  . CT CORONARY MORPH W/CTA COR W/SCORE W/CA W/CM &/OR WO/CM  . CT CORONARY FRACTIONAL FLOW RESERVE DATA PREP  . CT CORONARY FRACTIONAL FLOW RESERVE FLUID ANALYSIS  . Basic metabolic panel  . EKG 12-Lead     Disposition:   FU with Dr. Jonelle Sidle C. [place] in 4 months.  Signed, Skeet Latch, MD  03/26/2020 2:00 PM    [place]

## 2020-03-26 ENCOUNTER — Encounter: Payer: Self-pay | Admitting: Cardiovascular Disease

## 2020-03-26 DIAGNOSIS — I208 Other forms of angina pectoris: Secondary | ICD-10-CM | POA: Insufficient documentation

## 2020-03-26 DIAGNOSIS — I209 Angina pectoris, unspecified: Secondary | ICD-10-CM

## 2020-03-26 HISTORY — DX: Angina pectoris, unspecified: I20.9

## 2020-03-28 ENCOUNTER — Telehealth (HOSPITAL_COMMUNITY): Payer: Self-pay | Admitting: Emergency Medicine

## 2020-03-28 ENCOUNTER — Telehealth: Payer: Self-pay | Admitting: *Deleted

## 2020-03-28 NOTE — Telephone Encounter (Signed)
Called patient to verify takes his Metoprolol in evening and to needs to get labs today for CT tomorrow Left message to call back

## 2020-03-28 NOTE — Telephone Encounter (Signed)
After conversation with Oval Linsey, request was made to have patient take additional 50mg  TOPROL XL 2 hr prior to scan for HR control. Pt verbalized understanding.   Phone number provided for further questions if any.  Marchia Bond RN Navigator Cardiac Imaging [facility] [phone] Office  [phone] Cell

## 2020-03-28 NOTE — Telephone Encounter (Signed)
Reaching out to patient to offer assistance regarding upcoming cardiac imaging study; pt verbalizes understanding of appt date/time, parking situation and where to check in, pre-test NPO status and medications ordered, and verified current allergies; name and call back number provided for further questions should they arise Enio Hornback RN Navigator Cardiac Imaging [place] [facility] [phone] office [phone] cell 

## 2020-03-28 NOTE — Telephone Encounter (Signed)
Lorenza Evangelist, RN at 03/28/2020 3:32 PM  Status: Signed    After conversation with Oval Linsey, request was made to have patient take additional 50mg  TOPROL XL 2 hr prior to scan for HR control. Pt verbalized understanding.   Phone number provided for further questions if any.  Marchia Bond RN Navigator Cardiac Imaging [facility] [phone] Office  [phone] Cell

## 2020-03-29 ENCOUNTER — Other Ambulatory Visit: Payer: Self-pay

## 2020-03-29 ENCOUNTER — Ambulatory Visit (HOSPITAL_COMMUNITY)
Admission: RE | Admit: 2020-03-29 | Discharge: 2020-03-29 | Disposition: A | Discharge status: Home / Self Care | Payer: Medicare Other | Source: Ambulatory Visit | Attending: Cardiovascular Disease | Admitting: Cardiovascular Disease

## 2020-03-29 DIAGNOSIS — I2510 Atherosclerotic heart disease of native coronary artery without angina pectoris: Secondary | ICD-10-CM | POA: Insufficient documentation

## 2020-03-29 DIAGNOSIS — R072 Precordial pain: Secondary | ICD-10-CM | POA: Insufficient documentation

## 2020-03-29 DIAGNOSIS — I10 Essential (primary) hypertension: Secondary | ICD-10-CM | POA: Insufficient documentation

## 2020-03-29 LAB — POCT I-STAT CREATININE: Creatinine, Ser: 0.7 mg/dL (ref 0.61–1.24)

## 2020-03-29 MED ORDER — IOHEXOL 350 MG/ML SOLN
80.0000 mL | Freq: Once | INTRAVENOUS | Status: AC | PRN
  Administered 2020-03-29: 80 mL via INTRAVENOUS

## 2020-03-29 MED ORDER — METOPROLOL TARTRATE 5 MG/5ML IV SOLN
INTRAVENOUS | Status: AC
  Filled 2020-03-29: qty 5

## 2020-03-29 MED ORDER — NITROGLYCERIN 0.4 MG SL SUBL
SUBLINGUAL_TABLET | SUBLINGUAL | Status: AC
  Filled 2020-03-29: qty 2

## 2020-03-29 MED ORDER — NITROGLYCERIN 0.4 MG SL SUBL
0.8000 mg | SUBLINGUAL_TABLET | Freq: Once | SUBLINGUAL | Status: AC
  Administered 2020-03-29: 0.8 mg via SUBLINGUAL

## 2020-03-30 ENCOUNTER — Ambulatory Visit (HOSPITAL_COMMUNITY)
Admission: RE | Admit: 2020-03-30 | Discharge: 2020-03-30 | Disposition: A | Discharge status: Home / Self Care | Payer: Medicare Other | Source: Ambulatory Visit | Attending: Cardiovascular Disease | Admitting: Cardiovascular Disease

## 2020-03-30 DIAGNOSIS — R931 Abnormal findings on diagnostic imaging of heart and coronary circulation: Secondary | ICD-10-CM

## 2020-03-30 DIAGNOSIS — I2510 Atherosclerotic heart disease of native coronary artery without angina pectoris: Secondary | ICD-10-CM | POA: Diagnosis not present

## 2020-03-30 DIAGNOSIS — R072 Precordial pain: Secondary | ICD-10-CM

## 2020-03-30 DIAGNOSIS — Z6830 Body mass index (BMI) 30.0-30.9, adult: Secondary | ICD-10-CM

## 2020-03-30 DIAGNOSIS — I10 Essential (primary) hypertension: Secondary | ICD-10-CM

## 2020-04-02 ENCOUNTER — Encounter: Payer: Self-pay | Admitting: *Deleted

## 2020-04-02 ENCOUNTER — Telehealth: Payer: Self-pay | Admitting: *Deleted

## 2020-04-02 ENCOUNTER — Encounter: Payer: Self-pay | Admitting: Cardiovascular Disease

## 2020-04-02 NOTE — Telephone Encounter (Signed)
Patient has been scheduled for cath Friday Instructions reviewed with wife and sent through [facility]

## 2020-04-02 NOTE — Telephone Encounter (Signed)
Wife of the patient was calling to schedule the patient's heart cath. Please call her to schedule the procedure

## 2020-04-02 NOTE — Telephone Encounter (Signed)
This encounter was created in error - please disregard.

## 2020-04-02 NOTE — Telephone Encounter (Signed)
-----   Message from Skeet Latch, MD sent at 04/01/2020  1:16 PM EDT ----- Results reviewed with patient.  He is okay with having a heart catheterization.  He would like to do this on Monday as his wife does not work that day.  He will need to start aspirin 81 mg.  This will be in addition to his Xarelto.  He understands that if he has chest pain that does not go away with rest he will need to be seen in the ED.

## 2020-04-03 ENCOUNTER — Other Ambulatory Visit (HOSPITAL_COMMUNITY)
Admission: RE | Admit: 2020-04-03 | Discharge: 2020-04-03 | Disposition: A | Discharge status: Home / Self Care | Payer: Medicare Other | Source: Ambulatory Visit | Attending: Cardiovascular Disease | Admitting: Cardiovascular Disease

## 2020-04-03 DIAGNOSIS — Z01812 Encounter for preprocedural laboratory examination: Secondary | ICD-10-CM | POA: Diagnosis present

## 2020-04-03 DIAGNOSIS — Z20822 Contact with and (suspected) exposure to covid-19: Secondary | ICD-10-CM | POA: Diagnosis not present

## 2020-04-03 LAB — SARS CORONAVIRUS 2 (TAT 6-24 HRS): SARS Coronavirus 2: NEGATIVE

## 2020-04-05 ENCOUNTER — Telehealth: Payer: Self-pay | Admitting: *Deleted

## 2020-04-05 NOTE — Telephone Encounter (Signed)
03/15/20 BMP/CBC results in [place].

## 2020-04-05 NOTE — Telephone Encounter (Addendum)
Pt contacted pre-catheterization scheduled at [facility] for: Friday Apr 06, 2020 7:30 AM Verified arrival time and place: [facility]) at: 5:30 AM   No solid food after midnight prior to cath, clear liquids until 5 AM day of procedure.  Hold: Xarelto-none 04/04/20 until post procedure.  Except hold medications AM meds can be  taken pre-cath with sip of water including: ASA 81 mg   Confirmed patient has responsible adult to drive home post procedure and observe 24 hours after arriving home: yes  You are allowed ONE visitor in the waiting room during your procedure. Both you and your visitor must wear masks.      COVID-19 Pre-Screening Questions:  . In the past 7 to 10 days have you had a cough,  shortness of breath, headache, congestion, fever (100 or greater) body aches, chills, sore throat, or sudden loss of taste or sense of smell? no . Have you been around anyone with known Covid 19 in the past 7 to 10 days? no . Have you been around anyone who is awaiting Covid 19 test results in the past 7 to 10 days? no . Have you been around anyone who  has mentioned symptoms of Covid 19 within the past 7 to 10 days? no   Reviewed procedure/mask/visitor instructions, COVID-19 screening questions with patient.

## 2020-04-06 ENCOUNTER — Ambulatory Visit (HOSPITAL_COMMUNITY)
Admission: RE | Admit: 2020-04-06 | Discharge: 2020-04-06 | Disposition: A | Discharge status: Home / Self Care | Payer: Medicare Other | Attending: Cardiovascular Disease | Admitting: Cardiovascular Disease

## 2020-04-06 ENCOUNTER — Encounter (HOSPITAL_COMMUNITY)
Admission: RE | Disposition: A | Discharge status: Home / Self Care | Payer: Self-pay | Source: Home / Self Care | Attending: Cardiovascular Disease

## 2020-04-06 ENCOUNTER — Other Ambulatory Visit: Payer: Self-pay

## 2020-04-06 DIAGNOSIS — K219 Gastro-esophageal reflux disease without esophagitis: Secondary | ICD-10-CM | POA: Insufficient documentation

## 2020-04-06 DIAGNOSIS — H9190 Unspecified hearing loss, unspecified ear: Secondary | ICD-10-CM | POA: Diagnosis not present

## 2020-04-06 DIAGNOSIS — G4700 Insomnia, unspecified: Secondary | ICD-10-CM | POA: Diagnosis not present

## 2020-04-06 DIAGNOSIS — I25118 Atherosclerotic heart disease of native coronary artery with other forms of angina pectoris: Secondary | ICD-10-CM | POA: Insufficient documentation

## 2020-04-06 DIAGNOSIS — I10 Essential (primary) hypertension: Secondary | ICD-10-CM | POA: Insufficient documentation

## 2020-04-06 DIAGNOSIS — Z8262 Family history of osteoporosis: Secondary | ICD-10-CM | POA: Diagnosis not present

## 2020-04-06 DIAGNOSIS — Z7901 Long term (current) use of anticoagulants: Secondary | ICD-10-CM | POA: Insufficient documentation

## 2020-04-06 DIAGNOSIS — Z833 Family history of diabetes mellitus: Secondary | ICD-10-CM | POA: Insufficient documentation

## 2020-04-06 DIAGNOSIS — I2089 Other forms of angina pectoris: Secondary | ICD-10-CM

## 2020-04-06 DIAGNOSIS — M329 Systemic lupus erythematosus, unspecified: Secondary | ICD-10-CM | POA: Diagnosis not present

## 2020-04-06 DIAGNOSIS — Z87891 Personal history of nicotine dependence: Secondary | ICD-10-CM | POA: Insufficient documentation

## 2020-04-06 DIAGNOSIS — E119 Type 2 diabetes mellitus without complications: Secondary | ICD-10-CM | POA: Insufficient documentation

## 2020-04-06 DIAGNOSIS — I2584 Coronary atherosclerosis due to calcified coronary lesion: Secondary | ICD-10-CM | POA: Diagnosis not present

## 2020-04-06 DIAGNOSIS — Z8249 Family history of ischemic heart disease and other diseases of the circulatory system: Secondary | ICD-10-CM | POA: Diagnosis not present

## 2020-04-06 DIAGNOSIS — Z7952 Long term (current) use of systemic steroids: Secondary | ICD-10-CM | POA: Diagnosis not present

## 2020-04-06 DIAGNOSIS — I208 Other forms of angina pectoris: Secondary | ICD-10-CM | POA: Diagnosis present

## 2020-04-06 DIAGNOSIS — I480 Paroxysmal atrial fibrillation: Secondary | ICD-10-CM | POA: Insufficient documentation

## 2020-04-06 DIAGNOSIS — Z79899 Other long term (current) drug therapy: Secondary | ICD-10-CM | POA: Insufficient documentation

## 2020-04-06 DIAGNOSIS — Z96653 Presence of artificial knee joint, bilateral: Secondary | ICD-10-CM | POA: Insufficient documentation

## 2020-04-06 DIAGNOSIS — Z955 Presence of coronary angioplasty implant and graft: Secondary | ICD-10-CM

## 2020-04-06 DIAGNOSIS — I6523 Occlusion and stenosis of bilateral carotid arteries: Secondary | ICD-10-CM | POA: Diagnosis not present

## 2020-04-06 DIAGNOSIS — M797 Fibromyalgia: Secondary | ICD-10-CM | POA: Diagnosis not present

## 2020-04-06 HISTORY — PX: LEFT HEART CATH AND CORONARY ANGIOGRAPHY: CATH118249

## 2020-04-06 HISTORY — PX: CORONARY STENT INTERVENTION: CATH118234

## 2020-04-06 LAB — POCT ACTIVATED CLOTTING TIME: Activated Clotting Time: 434 seconds

## 2020-04-06 SURGERY — LEFT HEART CATH AND CORONARY ANGIOGRAPHY
Anesthesia: LOCAL

## 2020-04-06 MED ORDER — SODIUM CHLORIDE 0.9% FLUSH: 3.0000 mL | INTRAVENOUS | Status: DC | PRN

## 2020-04-06 MED ORDER — MIDAZOLAM HCL 2 MG/2ML IJ SOLN
INTRAMUSCULAR | Status: DC | PRN
  Administered 2020-04-06: 1 mg via INTRAVENOUS

## 2020-04-06 MED ORDER — PANTOPRAZOLE SODIUM 40 MG PO TBEC
40.0000 mg | DELAYED_RELEASE_TABLET | Freq: Every day | ORAL | 6 refills | Status: DC
Start: 2020-04-06 — End: 2020-04-17

## 2020-04-06 MED ORDER — ACETAMINOPHEN 325 MG PO TABS: 650.0000 mg | ORAL_TABLET | ORAL | Status: DC | PRN

## 2020-04-06 MED ORDER — LIDOCAINE HCL (PF) 1 % IJ SOLN
INTRAMUSCULAR | Status: AC
  Filled 2020-04-06: qty 30

## 2020-04-06 MED ORDER — LABETALOL HCL 5 MG/ML IV SOLN
10.0000 mg | INTRAVENOUS | Status: AC | PRN
  Administered 2020-04-06: 10 mg via INTRAVENOUS
  Filled 2020-04-06: qty 4

## 2020-04-06 MED ORDER — VERAPAMIL HCL 2.5 MG/ML IV SOLN
INTRAVENOUS | Status: AC
  Filled 2020-04-06: qty 2

## 2020-04-06 MED ORDER — LIDOCAINE HCL (PF) 1 % IJ SOLN
INTRAMUSCULAR | Status: DC | PRN
  Administered 2020-04-06: 2 mL

## 2020-04-06 MED ORDER — SODIUM CHLORIDE 0.9 % IV SOLN: 250.0000 mL | INTRAVENOUS | Status: DC | PRN

## 2020-04-06 MED ORDER — CLOPIDOGREL BISULFATE 75 MG PO TABS: 75.0000 mg | ORAL_TABLET | Freq: Every day | ORAL | 11 refills | Status: DC

## 2020-04-06 MED ORDER — IOHEXOL 350 MG/ML SOLN
INTRAVENOUS | Status: DC | PRN
  Administered 2020-04-06: 135 mL

## 2020-04-06 MED ORDER — FENTANYL CITRATE (PF) 100 MCG/2ML IJ SOLN
INTRAMUSCULAR | Status: AC
  Filled 2020-04-06: qty 2

## 2020-04-06 MED ORDER — SODIUM CHLORIDE 0.9 % IV SOLN: INTRAVENOUS | Status: DC

## 2020-04-06 MED ORDER — ASPIRIN 81 MG PO CHEW: 81.0000 mg | CHEWABLE_TABLET | ORAL | Status: DC

## 2020-04-06 MED ORDER — CLOPIDOGREL BISULFATE 300 MG PO TABS
ORAL_TABLET | ORAL | Status: DC | PRN
  Administered 2020-04-06: 600 mg via ORAL

## 2020-04-06 MED ORDER — HEPARIN SODIUM (PORCINE) 1000 UNIT/ML IJ SOLN
INTRAMUSCULAR | Status: AC
  Filled 2020-04-06: qty 1

## 2020-04-06 MED ORDER — HEPARIN (PORCINE) IN NACL 1000-0.9 UT/500ML-% IV SOLN
INTRAVENOUS | Status: AC
  Filled 2020-04-06: qty 1000

## 2020-04-06 MED ORDER — ONDANSETRON HCL 4 MG/2ML IJ SOLN: 4.0000 mg | Freq: Four times a day (QID) | INTRAMUSCULAR | Status: DC | PRN

## 2020-04-06 MED ORDER — NITROGLYCERIN 0.4 MG SL SUBL
SUBLINGUAL_TABLET | SUBLINGUAL | Status: DC | PRN
  Administered 2020-04-06: .4 mg via SUBLINGUAL

## 2020-04-06 MED ORDER — MIDAZOLAM HCL 2 MG/2ML IJ SOLN
INTRAMUSCULAR | Status: AC
  Filled 2020-04-06: qty 2

## 2020-04-06 MED ORDER — VERAPAMIL HCL 2.5 MG/ML IV SOLN
INTRAVENOUS | Status: DC | PRN
  Administered 2020-04-06: 10 mL via INTRA_ARTERIAL

## 2020-04-06 MED ORDER — SODIUM CHLORIDE 0.9 % WEIGHT BASED INFUSION: 1.0000 mL/kg/h | INTRAVENOUS | Status: DC

## 2020-04-06 MED ORDER — SODIUM CHLORIDE 0.9 % WEIGHT BASED INFUSION
3.0000 mL/kg/h | INTRAVENOUS | Status: AC
  Administered 2020-04-06: 3 mL/kg/h via INTRAVENOUS

## 2020-04-06 MED ORDER — HEPARIN (PORCINE) IN NACL 1000-0.9 UT/500ML-% IV SOLN
INTRAVENOUS | Status: DC | PRN
  Administered 2020-04-06: 500 mL

## 2020-04-06 MED ORDER — CLOPIDOGREL BISULFATE 300 MG PO TABS
ORAL_TABLET | ORAL | Status: AC
  Filled 2020-04-06: qty 2

## 2020-04-06 MED ORDER — NITROGLYCERIN 0.4 MG SL SUBL
SUBLINGUAL_TABLET | SUBLINGUAL | Status: AC
  Filled 2020-04-06: qty 1

## 2020-04-06 MED ORDER — HEPARIN SODIUM (PORCINE) 1000 UNIT/ML IJ SOLN
INTRAMUSCULAR | Status: DC | PRN
  Administered 2020-04-06: 5000 [IU] via INTRAVENOUS
  Administered 2020-04-06: 4500 [IU] via INTRAVENOUS

## 2020-04-06 MED ORDER — FENTANYL CITRATE (PF) 100 MCG/2ML IJ SOLN
INTRAMUSCULAR | Status: DC | PRN
  Administered 2020-04-06: 25 ug via INTRAVENOUS

## 2020-04-06 MED ORDER — SODIUM CHLORIDE 0.9% FLUSH: 3.0000 mL | Freq: Two times a day (BID) | INTRAVENOUS | Status: DC

## 2020-04-06 MED ORDER — CLOPIDOGREL BISULFATE 75 MG PO TABS: 75.0000 mg | ORAL_TABLET | Freq: Every day | ORAL | Status: DC

## 2020-04-06 MED ORDER — ATORVASTATIN CALCIUM 40 MG PO TABS
40.0000 mg | ORAL_TABLET | Freq: Every day | ORAL | 6 refills | Status: DC
Start: 2020-04-06 — End: 2020-04-17

## 2020-04-06 MED FILL — CLOPIDOGREL 75 MG TABLET: 75 | 30 days supply | Qty: 30 | Fill #0

## 2020-04-06 MED FILL — PANTOPRAZOLE SOD DR 40 MG T: 40 | 30 days supply | Qty: 30 | Fill #0

## 2020-04-06 SURGICAL SUPPLY — 18 items
BALLN SAPPHIRE 2.5X15 (BALLOONS) ×2
BALLN SAPPHIRE ~~LOC~~ 3.25X18 (BALLOONS) ×1 IMPLANT
BALLOON SAPPHIRE 2.5X15 (BALLOONS) IMPLANT
CATH INFINITI 5 FR JL3.5 (CATHETERS) ×1 IMPLANT
CATH INFINITI 5FR JK (CATHETERS) ×1 IMPLANT
CATH VISTA GUIDE 6FR XBLAD3.5 (CATHETERS) ×1 IMPLANT
DEVICE RAD COMP TR BAND LRG (VASCULAR PRODUCTS) ×1 IMPLANT
GLIDESHEATH SLEND SS 6F .021 (SHEATH) ×1 IMPLANT
GUIDEWIRE INQWIRE 1.5J.035X260 (WIRE) IMPLANT
INQWIRE 1.5J .035X260CM (WIRE) ×2
KIT ENCORE 26 ADVANTAGE (KITS) ×1 IMPLANT
KIT HEART LEFT (KITS) ×2 IMPLANT
PACK CARDIAC CATHETERIZATION (CUSTOM PROCEDURE TRAY) ×2 IMPLANT
STENT RESOLUTE ONYX 3.0X34 (Permanent Stent) ×1 IMPLANT
TRANSDUCER W/STOPCOCK (MISCELLANEOUS) ×2 IMPLANT
TUBING CIL FLEX 10 FLL-RA (TUBING) ×2 IMPLANT
WIRE HI TORQ VERSACORE-J 145CM (WIRE) ×1 IMPLANT
WIRE RUNTHROUGH .014X180CM (WIRE) ×1 IMPLANT

## 2020-04-06 NOTE — Interval H&P Note (Signed)
Cath Lab Visit (complete for each Cath Lab visit)  Clinical Evaluation Leading to the Procedure:   ACS: No.  Non-ACS:    Anginal Classification: CCS III  Anti-ischemic medical therapy: Maximal Therapy (2 or more classes of medications)  Non-Invasive Test Results: Intermediate-risk stress test findings: cardiac mortality 1-3%/year  Prior CABG: No previous CABG      History and Physical Interval Note:  04/06/2020 7:27 AM  David Weaver  has presented today for surgery, with the diagnosis of abnormal ct.  The various methods of treatment have been discussed with the patient and family. After consideration of risks, benefits and other options for treatment, the patient has consented to  Procedure(s): LEFT HEART CATH AND CORONARY ANGIOGRAPHY (N/A) as a surgical intervention.  The patient's history has been reviewed, patient examined, no change in status, stable for surgery.  I have reviewed the patient's chart and labs.  Questions were answered to the patient's satisfaction.     Kathlyn [place]

## 2020-04-06 NOTE — Discharge Instructions (Signed)
Drink plenty of fluids for 48 hours and keep wrist elevated at heart level for 24 hours  Radial Site Care   This sheet gives you information about how to care for yourself after your procedure. Your health care provider may also give you more specific instructions. If you have problems or questions, contact your health care provider. What can I expect after the procedure? After the procedure, it is common to have:  Bruising and tenderness at the catheter insertion area. Follow these instructions at home: Medicines  Take over-the-counter and prescription medicines only as told by your health care provider. Insertion site care 1. Follow instructions from your health care provider about how to take care of your insertion site. Make sure you: ? Wash your hands with soap and water before you change your bandage (dressing). If soap and water are not available, use hand sanitizer. ? Remove your dressing as told by your health care provider. In 24 hours 2. Check your insertion site every day for signs of infection. Check for: ? Redness, swelling, or pain. ? Fluid or blood. ? Pus or a bad smell. ? Warmth. 3. Do not take baths, swim, or use a hot tub until your health care provider approves. 4. You may shower 24-48 hours after the procedure, or as directed by your health care provider. ? Remove the dressing and gently wash the site with plain soap and water. ? Pat the area dry with a clean towel. ? Do not rub the site. That could cause bleeding. 5. Do not apply powder or lotion to the site. Activity   1. For 24 hours after the procedure, or as directed by your health care provider: ? Do not flex or bend the affected arm. ? Do not push or pull heavy objects with the affected arm. ? Do not drive yourself home from the hospital or clinic. You may drive 24 hours after the procedure unless your health care provider tells you not to. ? Do not operate machinery or power tools. 2. Do not lift  anything that is heavier than 5 lb, or the limit that you are told, until your health care provider says that it is safe.  For 4 days 3. Ask your health care provider when it is okay to: ? Return to work or school. ? Resume usual physical activities or sports. ? Resume sexual activity. General instructions  If the catheter site starts to bleed, raise your arm and put firm pressure on the site. If the bleeding does not stop, get help right away. This is a medical emergency.  If you went home on the same day as your procedure, a responsible adult should be with you for the first 24 hours after you arrive home.  Keep all follow-up visits as told by your health care provider. This is important. Contact a health care provider if:  You have a fever.  You have redness, swelling, or yellow drainage around your insertion site. Get help right away if:  You have unusual pain at the radial site.  The catheter insertion area swells very fast.  The insertion area is bleeding, and the bleeding does not stop when you hold steady pressure on the area.  Your arm or hand becomes pale, cool, tingly, or numb. These symptoms may represent a serious problem that is an emergency. Do not wait to see if the symptoms will go away. Get medical help right away. Call your local emergency services (911 in the [place] Do not drive  yourself to the hospital. Summary  After the procedure, it is common to have bruising and tenderness at the site.  Follow instructions from your health care provider about how to take care of your radial site wound. Check the wound every day for signs of infection.  Do not lift anything that is heavier than 10 lb (4.5 kg), or the limit that you are told, until your health care provider says that it is safe. This information is not intended to replace advice given to you by your health care provider. Make sure you discuss any questions you have with your health care provider. Document  Revised: 12/23/2017 Document Reviewed: 12/23/2017 Elsevier Patient Education  2020 [employer].

## 2020-04-06 NOTE — Progress Notes (Signed)
CARDIAC REHAB PHASE I   Stent education completed with pt. Pt educated on importance of xerelto and Plavix. Pt given stent card and heart healthy diet. Reviewed restrictions, site care and exercise guidelines. Will refer to CRP II [place].  [id number] Rufina Falco, RN BSN 04/06/2020 1:33 PM

## 2020-04-06 NOTE — Discharge Summary (Signed)
Discharge Summary for Same Day PCI   Patient ID: David Weaver MRN: [id number]; DOB: 1951-08-16  Admit date: 04/06/2020 Discharge date: 04/06/2020  Primary Care Provider: Wardell Honour, MD  Primary Cardiologist: Skeet Latch, MD   Discharge Diagnoses    Active Problems:   Stable angina [facility])   CAD  PAF  HTN Carotid artery stenosis    Diagnostic Studies/Procedures    Cardiac Catheterization 04/06/2020:  CORONARY STENT INTERVENTION  LEFT HEART CATH AND CORONARY ANGIOGRAPHY  Conclusion    The left ventricular systolic function is normal.  LV end diastolic pressure is normal.  The left ventricular ejection fraction is 55-65% by visual estimate.  Mid LAD lesion is 80% stenosed.  Post intervention, there is a 0% residual stenosis.  A drug-eluting stent was successfully placed using a STENT RESOLUTE ONYX 3.0X34.  Ost LAD lesion is 30% stenosed.   1.  Severe one-vessel coronary artery disease involving mid LAD with 80% moderately calcified eccentric stenosis.  No other obstructive disease. 2.  Normal LV systolic function and left ventricular end-diastolic pressure. 3.  Successful angioplasty and drug-eluting stent placement to the mid LAD.  PCI indication is class III angina on maximal medical therapy and intermediate risk noninvasive testing.  Recommendations: Xarelto can be resumed tomorrow. Given that the patient is long-term anticoagulation with Xarelto, recommend clopidogrel monotherapy without aspirin to minimize risk of bleeding. I increased atorvastatin to 40 mg daily. The patient is a candidate for same-day discharge.     Diagnostic Dominance: Right  Intervention     History of Present Illness     David Weaver is a 69 y.o. male with with history of paroxysmal atrial fibrillation, mild carotid stenosis, hypertension and lupus presented for outpatient cardiac catheterization.  Exercise Myoview March 2019 showed LV function of 46% with global  hypokinesis.  He was able to achieve 10.5 METS of activity on Bruce protocol.  Follow-up echocardiogram showed LV function of 55 to 60% with grade 1 diastolic dysfunction.  Most recently evaluated by Dr. Oval Linsey February 24, 2020 for follow-up.  Noted to have exertional chest pain concerning for angina.  Follow-up CT coronary showed calcium score of 522.  Moderate plaque in mid to distal RCA. CT FFR analysis showed significant stenosis in the mid LAD at the level of D3. Cardiac catheterization was arranged for further evaluation.  Hospital Course     The patient underwent cardiac cath as noted above with Severe one-vessel coronary artery disease involving mid LAD with 80% moderately calcified eccentric stenosis.  No other obstructive disease.  S/p successful PTCA and DES placement to mid LAD.  Patient on Xarelto for anticoagulation.  Recommended monotherapy with Plavix only to minimize bleeding risk.  No aspirin given.  The patient was seen by cardiac rehab while in short stay. There were no observed complications post cath. Radial cath site was re-evaluated prior to discharge and found to be stable without any complications. Instructions/precautions regarding cath site care were given prior to discharge.  [name] was seen by Dr. Fletcher Anon and determined stable for discharge home. Follow up with our office has been arranged. Medications are listed below. Pertinent changes includes addition of Plavix and Increased atorvastatin to 40 mg daily.   LDL goal less than 70.   Cath/PCI Registry Performance & Quality Measures: 1. Aspirin prescribed? - No - Patient on long-term anticoagulation with Xarelto 2. ADP Receptor Inhibitor (Plavix/Clopidogrel, Brilinta/Ticagrelor or Effient/Prasugrel) prescribed (includes medically managed patients)? - Yes 3. High Intensity Statin (Lipitor  40-80mg  or Crestor 20-40mg ) prescribed? - Yes 4. For EF <40%, was ACEI/ARB prescribed? - Not Applicable (EF >/= [id number]) 5. For EF  <40%, Aldosterone Antagonist (Spironolactone or Eplerenone) prescribed? - Not Applicable (EF >/= [id number]) 6. Cardiac Rehab Phase II ordered (Included Medically managed Patients)? - Yes  _____________   Discharge Vitals Blood pressure (!) 157/85, pulse 90, temperature 98.2 F (36.8 C), temperature source Skin, resp. rate 18, height 5\' 7"  (1.702 m), weight 89.4 kg, SpO2 97 %.  Filed Weights   04/06/20 0610  Weight: 89.4 kg    Last Labs & Radiologic Studies    CBC No results for input(s): WBC, NEUTROABS, HGB, HCT, MCV, PLT in the last 72 hours. Basic Metabolic Panel No results for input(s): NA, K, CL, CO2, GLUCOSE, BUN, CREATININE, CALCIUM, MG, PHOS in the last 72 hours. Liver Function Tests No results for input(s): AST, ALT, ALKPHOS, BILITOT, PROT, ALBUMIN in the last 72 hours. No results for input(s): LIPASE, AMYLASE in the last 72 hours. High Sensitivity Troponin:   No results for input(s): TROPONINIHS in the last 720 hours.  BNP Invalid input(s): POCBNP D-Dimer No results for input(s): DDIMER in the last 72 hours. Hemoglobin A1C No results for input(s): HGBA1C in the last 72 hours. Fasting Lipid Panel No results for input(s): CHOL, HDL, LDLCALC, TRIG, CHOLHDL, LDLDIRECT in the last 72 hours. Thyroid Function Tests No results for input(s): TSH, T4TOTAL, T3FREE, THYROIDAB in the last 72 hours.  Invalid input(s): FREET3 _____________  CARDIAC CATHETERIZATION  Result Date: 04/06/2020  The left ventricular systolic function is normal.  LV end diastolic pressure is normal.  The left ventricular ejection fraction is 55-65% by visual estimate.  Mid LAD lesion is 80% stenosed.  Post intervention, there is a 0% residual stenosis.  A drug-eluting stent was successfully placed using a STENT RESOLUTE ONYX 3.0X34.  Ost LAD lesion is 30% stenosed.  1.  Severe one-vessel coronary artery disease involving mid LAD with 80% moderately calcified eccentric stenosis.  No other obstructive  disease. 2.  Normal LV systolic function and left ventricular end-diastolic pressure. 3.  Successful angioplasty and drug-eluting stent placement to the mid LAD.  PCI indication is class III angina on maximal medical therapy and intermediate risk noninvasive testing. Recommendations: Xarelto can be resumed tomorrow. Given that the patient is long-term anticoagulation with Xarelto, recommend clopidogrel monotherapy without aspirin to minimize risk of bleeding. I increased atorvastatin to 40 mg daily. The patient is a candidate for same-day discharge.   CT CORONARY MORPH W/CTA COR W/SCORE W/CA W/CM &/OR WO/CM  Addendum Date: 03/29/2020   ADDENDUM REPORT: 03/29/2020 22:53 CLINICAL DATA:  10M with exertional shortness of breath and arm discomfort. EXAM: Cardiac/Coronary  CT TECHNIQUE: The patient was scanned on a [employer]. FINDINGS: A 120 kV prospective scan was triggered in the descending thoracic aorta at 111 HU's. Axial non-contrast 3 mm slices were carried out through the heart. The data set was analyzed on a dedicated work station and scored using the [place]. Gantry rotation speed was 250 msecs and collimation was .6 mm. No beta blockade and 0.8 mg of sl NTG was given. The 3D data set was reconstructed in 5% intervals of the 67-82 % of the R-R cycle. Diastolic phases were analyzed on a dedicated work station using MPR, MIP and VRT modes. The patient received 80 cc of contrast. Aorta: Mildly dilated. 3.7 mm. Mild calcification in the aortic root and descending aorta. No dissection. Aortic Valve: Trileaflet.  No calcification. Coronary  Arteries:  Normal coronary origin.  Right dominance. RCA is a large dominant artery that gives rise to PDA and 2 PLV branches. There is minimum (<25%) calcified plaque proximally and moderate (50-69%) mixed plaque in the distal RCA. There . Left main is a large artery that gives rise to LAD and LCX arteries. LAD is a large vessel that has diffuse, scattered  plaque. There is mild (25-49%) mostly calcified plaque proximally. There is a focal area of moderate (50-69%) mixed plaque in the mid LAD at the level of D3. There is no plaque is small D1 or D2. D3 is a small vessel with calcified plaque at the ostium. There are also small D4 and D5 vessels without significant plaque. LCX is a non-dominant artery that gives rise to one large OM1 branch. There is no plaque. Other findings: Normal pulmonary vein drainage into the left atrium. Normal let atrial appendage without a thrombus. Normal size of the pulmonary artery. IMPRESSION: 1. Coronary calcium score of 522. This was 72nd percentile for age and sex matched control. 2. Normal coronary origin with right dominance. 3. There is moderate (CAD RADS3) plaque in the mid LAD and distal RCA. 4. There is concern that the mid LAD lesion could be obstructive. Will send study for FFRct. Skeet Latch, MD Electronically Signed   By: Skeet Latch   On: 03/29/2020 22:53   Result Date: 03/29/2020 EXAM: OVER-READ INTERPRETATION  CT CHEST The following report is an over-read performed by radiologist Dr. Vinnie Langton of [facility], [place] on 03/29/2020. This over-read does not include interpretation of cardiac or coronary anatomy or pathology. The coronary calcium score/coronary CTA interpretation by the cardiologist is attached. COMPARISON:  None. FINDINGS: Aortic atherosclerosis. Areas of cylindrical bronchiectasis in the medial aspect of the right lower lobe and inferior segment of the lingula. Within the visualized portions of the thorax there are no suspicious appearing pulmonary nodules or masses, there is no acute consolidative airspace disease, no pleural effusions, no pneumothorax and no lymphadenopathy. Visualized portions of the upper abdomen are unremarkable. There are no aggressive appearing lytic or blastic lesions noted in the visualized portions of the skeleton. IMPRESSION: 1.  Aortic Atherosclerosis  (ICD10-I70.0). 2. Areas of cylindrical bronchiectasis in the medial aspect of the right lower lobe and inferior segment of the lingula, likely sequela of prior infections. Electronically Signed: By: Vinnie Langton M.D. On: 03/29/2020 14:59   CT CORONARY FRACTIONAL FLOW RESERVE DATA PREP  Result Date: 03/31/2020 EXAM: FFRCT ANALYSIS CLINICAL DATA:  49M with abnormal coronary CT-A. FINDINGS: FFRct analysis was performed on the original cardiac CT angiogram dataset. Diagrammatic representation of the FFRct analysis is provided in a separate PDF document in PACS. This dictation was created using the PDF document and an interactive 3D model of the results. 3D model is not available in the EMR/PACS. Normal FFR range is >0.80. 1. Left Main:  No significant stenosis.  FFRct 0.98 2. LAD: Significant stenosis at the level of D3. FFRct 0.96 proximal, 0.79 mid, 0.62 after D3. 3. LCX: No significant stenosis.  FFRct 0.92.  OM2 FFRct 0.96 4. RCA: No significant stenosis. FFRct 0.95 proximal, 0.93 mid, 0.85 distal. IMPRESSION: 1. CT FFR analysis showed significant stenosis in the mid LAD at the level of D3. 2.  No other significant stenoses were identified. Note: These examples are not recommendations of [employer] and only provided as examples of what other customers are doing. Electronically Signed   By: Skeet Latch   On: 03/31/2020 11:54  Disposition   Pt is being discharged home today in good condition.  Follow-up Plans & Appointments     Discharge Instructions    AMB Referral to Cardiac Rehabilitation - Phase II   Complete by: As directed    Diagnosis: Coronary Stents   After initial evaluation and assessments completed: Virtual Based Care may be provided alone or in conjunction with Phase 2 Cardiac Rehab based on patient barriers.: Yes       Discharge Medications   Allergies as of 04/06/2020   No Known Allergies     Medication List    STOP taking these medications   aspirin EC 81 MG  tablet   metoprolol succinate 100 MG 24 hr tablet Commonly known as: TOPROL-XL   omeprazole 40 MG capsule Commonly known as: PRILOSEC     TAKE these medications   amLODipine 10 MG tablet Commonly known as: NORVASC Take 10 mg by mouth daily. What changed: Another medication with the same name was removed. Continue taking this medication, and follow the directions you see here.   amphetamine-dextroamphetamine 20 MG tablet Commonly known as: ADDERALL Take 20 mg by mouth every morning.   atorvastatin 40 MG tablet Commonly known as: LIPITOR Take 1 tablet (40 mg total) by mouth daily. What changed:   medication strength  how much to take   chlorpheniramine 4 MG tablet Commonly known as: CHLOR-TRIMETON Take 4 mg by mouth 2 (two) times daily as needed for allergies.   clopidogrel 75 MG tablet Commonly known as: PLAVIX Take 1 tablet (75 mg total) by mouth daily with breakfast.   colestipol 1 g tablet Commonly known as: COLESTID Take 2 g by mouth 2 (two) times daily.   Dialyvite Vitamin D 5000 125 MCG (5000 UT) capsule Generic drug: Cholecalciferol Take 5,000 Units by mouth daily.   DULoxetine 60 MG capsule Commonly known as: CYMBALTA Take 1 capsule (60 mg total) 2 (two) times daily by mouth.   hydrocortisone 2.5 % cream Apply 1 application topically 2 (two) times daily as needed (itching/rash).   hydroxychloroquine 200 MG tablet Commonly known as: PLAQUENIL TAKE 1 TABLET BY MOUTH TWICE A DAY   ketoconazole 2 % cream Commonly known as: NIZORAL Apply 1 application topically 2 (two) times daily as needed (lupus flare).   losartan 25 MG tablet Commonly known as: COZAAR Take 1 tablet (25 mg total) by mouth daily.   methocarbamol 500 MG tablet Commonly known as: ROBAXIN Take 500 mg by mouth 3 (three) times daily.   metoprolol tartrate 50 MG tablet Commonly known as: LOPRESSOR Take 50 mg by mouth 2 (two) times daily.   mirtazapine 15 MG tablet Commonly known  as: REMERON Take 15 mg by mouth at bedtime.   ondansetron 4 MG tablet Commonly known as: ZOFRAN TAKE 1 TABLET BY MOUTH EVERY 8 HOURS AS NEEDED FOR NAUSEA AND VOMITING What changed: See the new instructions.   Oxycodone HCl 10 MG Tabs Take 10 mg by mouth every 6 (six) hours as needed (pain).   pantoprazole 40 MG tablet Commonly known as: Protonix Take 1 tablet (40 mg total) by mouth daily.   predniSONE 10 MG tablet Commonly known as: DELTASONE Take 10-50 mg by mouth See admin instructions. Take 50 mg daily for 2 days, then 40 mg daily for 2 days, then 30 mg daily for 2 days, 20 mg daily for 2 days, then take 10 mg daily   rivaroxaban 20 MG Tabs tablet Commonly known as: Xarelto Take 1 tablet (20 mg  total) by mouth daily with supper.          Allergies No Known Allergies  Outstanding Labs/Studies   None  Duration of Discharge Encounter   Greater than 30 minutes including physician time.  Jarrett Soho, PA 04/06/2020, 12:57 PM

## 2020-04-12 ENCOUNTER — Telehealth: Payer: Medicare Other | Admitting: Cardiovascular Disease

## 2020-04-17 MED ORDER — CLOPIDOGREL BISULFATE 75 MG PO TABS: 75.0000 mg | ORAL_TABLET | Freq: Every day | ORAL | 3 refills | Status: DC

## 2020-04-17 MED ORDER — PANTOPRAZOLE SODIUM 40 MG PO TBEC: 40.0000 mg | DELAYED_RELEASE_TABLET | Freq: Every day | ORAL | 3 refills | Status: DC

## 2020-04-17 MED ORDER — ATORVASTATIN CALCIUM 40 MG PO TABS: 40.0000 mg | ORAL_TABLET | Freq: Every day | ORAL | 3 refills | Status: DC

## 2020-04-17 NOTE — Addendum Note (Signed)
Addended by: Robyne Askew R on: 04/17/2020 04:45 PM   Modules accepted: Level of Service

## 2020-04-17 NOTE — Progress Notes (Signed)
Cardiology Clinic Note   Patient Name: David Weaver Date of Encounter: 04/18/2020  Primary Care Provider:  Wardell Honour, MD Primary Cardiologist:  Skeet Latch, MD  Patient Profile    David Weaver 69 year old male presents today for a follow-up status post PCI 04/06/2020 (DES x1 to mid LAD)  Past Medical History    Past Medical History:  Diagnosis Date  . Alcohol abuse, daily use   . Allergic rhinitis, cause unspecified   . Angina pectoris ([place]) 03/26/2020  . Atypical mole 10/19/2019   Right Cheek ([place])  . Dysthymic disorder   . Esophageal reflux   . Essential hypertension, benign   . Fibromyalgia    followed by [name] every six months.  . Fibromyositis   . Impotence of organic origin   . Insomnia, unspecified   . Left knee DJD   . Lupus erythematosus   . Osteoarthrosis, unspecified whether generalized or localized, lower leg   . Other abnormal glucose   . Other specified disorder of male genital organs(608.89)   . Systemic lupus erythematosus ([place])   . Unspecified hearing loss   . Unspecified vitamin D deficiency    Past Surgical History:  Procedure Laterality Date  . CARPAL TUNNEL RELEASE     bilateral  . CHOLECYSTECTOMY    . COLONOSCOPY  03/01/2014   two polyps; Dr. Benson Norway.  Repeat 5 years.  . CORONARY STENT INTERVENTION N/A 04/06/2020   Procedure: CORONARY STENT INTERVENTION;  Surgeon: Wellington Hampshire, MD;  Location: [facility];  Service: Cardiovascular;  Laterality: N/A;  . GANGLION CYST EXCISION  1980   Left wrist  . JOINT REPLACEMENT  01/2013   right knee, left knee. [place].  [name] KNEE ARTHROPLASTY    . KNEE ARTHROSCOPY  2008   Right  . LEFT HEART CATH AND CORONARY ANGIOGRAPHY N/A 04/06/2020   Procedure: LEFT HEART CATH AND CORONARY ANGIOGRAPHY;  Surgeon: Wellington Hampshire, MD;  Location: [facility];  Service: Cardiovascular;  Laterality: N/A;  . lumbar spine epidural  2009   [facility]  . ROTATOR CUFF REPAIR   10/2008   right  . TOTAL KNEE ARTHROPLASTY  12/27/2012   Procedure: TOTAL KNEE ARTHROPLASTY;  Surgeon: [name], MD;  Location: [facility];  Service: Orthopedics;  Laterality: Right;  RIGHT ARTHROPLASTY KNEE MEDIAL AND LATERAL COMPARTMENTS WITH PATELLA RESURFACING, RIGHT TOTAL KNEE REPLACEMENT  . TOTAL KNEE ARTHROPLASTY Left 01/31/2013   Procedure: TOTAL KNEE ARTHROPLASTY;  Surgeon: [name], MD;  Location: [facility];  Service: Orthopedics;  Laterality: Left;  . TOTAL SHOULDER ARTHROPLASTY      Allergies  No Known Allergies  History of Present Illness    Mr. Beardmore has a PMH of essential hypertension, paroxysmal atrial fibrillation, stable angina, hearing loss, fibromyalgia, lupus, mild carotid stenosis and coronary artery disease status post PCI with DES x1 on 04/06/2020.  He was seen in clinic on 9/16 for presurgical evaluation.  He was cleared for his shoulder surgery at that time.  During surgery Mr. Cockburn went into atrial fibrillation with RVR.  He then went back into NSR without intervention.  He was started on Eliquis 5 mg and referred to cardiology for follow-up.  He was prescribed a 30-day event monitor for further evaluation and to determine the need to continue anticoagulation.  His monitor did reveal recurrent atrial fibrillation so his anticoagulation was continued.  He reported exertional chest pain.  He was referred for an exercise stress test  3/19.  It showed an LVEF of 46% and global hypokinesis.  No ischemia was noted.  He was able to achieve 10.5 METS on a Bruce protocol.  He underwent echocardiogram that showed an LVEF of 55-60%, G1 DD.  He also reported dizziness.  Carotid Dopplers were ordered and showed 1-39% ICA stenosis bilaterally.  He was last seen by Dr. Oval Linsey on 03/09/2020.  During that time he reported that he had an episode of nausea while he was walking back from his shop to his house.  He indicated that he also had pain in his left arm at the same time.  The episode  lasted for several minutes.  He denied any chest pain or pressure.  During that time he was not exercising regularly.  He would occasionally walk for exercise.  He also indicated that at times he felt like he was going in and out of atrial fibrillation.  He indicated that he does snore and does not feel well rested when he wakes in the morning.  He denied daytime somnolence but did think he has apneic episodes.  He received both coronavirus vaccines.  He was also working with his PCP regarding his elevated LFTs.  Acetaminophen was removed from his medication list which he was using for chronic pain at his LFTs improved.  He has continued his statin.  A coronary CTA was ordered to further evaluate his coronary ischemia and it showed a calcium score of 522, normal coronary origin with right dominance, moderate plaque in his mid LAD and distal RCA.  He underwent cardiac catheterization on 04/06/2020 which showed an 80% mid LAD occlusion.  He received DES x1 and was placed on Plavix and his Xarelto was resumed.  He was not prescribed aspirin due to his Xarelto use.  He presents the clinic today for follow-up evaluation and states he is feeling much better since his cardiac catheterization.  He states he is breathing much better as well.  He has been increasing his physical activity slowly and is walking for 10 minutes 2 times a day.  He states he is not adding any sodium to his foods.  He has not had any further episodes of nauseousness.  I will give him the salty 6 dietary sheet, order lipid and LFTs for 8 weeks, and have him follow-up with Dr. Chipper Herb for 3 months.  Today he denies chest pain, shortness of breath, lower extremity edema, fatigue, palpitations, melena, hematuria, hemoptysis, diaphoresis, weakness, presyncope, syncope, orthopnea, and PND.   Home Medications    Prior to Admission medications   Medication Sig Start Date End Date Taking? Authorizing Provider  amLODipine (NORVASC) 10 MG tablet Take  10 mg by mouth daily.    [provider]  amphetamine-dextroamphetamine (ADDERALL) 20 MG tablet Take 20 mg by mouth every morning. 01/26/20   [provider]  atorvastatin (LIPITOR) 40 MG tablet Take 1 tablet (40 mg total) by mouth daily. 04/17/20   Skeet Latch, MD  chlorpheniramine (CHLOR-TRIMETON) 4 MG tablet Take 4 mg by mouth 2 (two) times daily as needed for allergies.    [provider]  Cholecalciferol (DIALYVITE VITAMIN D 5000) 125 MCG (5000 UT) capsule Take 5,000 Units by mouth daily.    [provider]  clopidogrel (PLAVIX) 75 MG tablet Take 1 tablet (75 mg total) by mouth daily with breakfast. 04/17/20   Skeet Latch, MD  colestipol (COLESTID) 1 g tablet Take 2 g by mouth 2 (two) times daily. 03/05/18   [provider]  DULoxetine (CYMBALTA) 60 MG capsule Take 1 capsule (60 mg total) 2 (two) times daily by mouth. 10/13/17   Wardell Honour, MD  hydrocortisone 2.5 % cream Apply 1 application topically 2 (two) times daily as needed (itching/rash).    [provider]  hydroxychloroquine (PLAQUENIL) 200 MG tablet TAKE 1 TABLET BY MOUTH TWICE A DAY Patient taking differently: Take 200 mg by mouth 2 (two) times daily.  02/20/20   Bo Merino, MD  ketoconazole (NIZORAL) 2 % cream Apply 1 application topically 2 (two) times daily as needed (lupus flare).    [provider]  losartan (COZAAR) 25 MG tablet Take 1 tablet (25 mg total) by mouth daily. 01/26/18   Wardell Honour, MD  methocarbamol (ROBAXIN) 500 MG tablet Take 500 mg by mouth 3 (three) times daily.     [provider]  metoprolol tartrate (LOPRESSOR) 50 MG tablet Take 50 mg by mouth 2 (two) times daily.    [provider]  mirtazapine (REMERON) 15 MG tablet Take 15 mg by mouth at bedtime.  04/14/19   [provider]  ondansetron (ZOFRAN) 4 MG tablet TAKE 1 TABLET BY MOUTH EVERY 8 HOURS AS NEEDED FOR NAUSEA AND VOMITING Patient taking  differently: Take 4 mg by mouth every 8 (eight) hours as needed for nausea or vomiting.  03/21/19   Horald Pollen, MD  Oxycodone HCl 10 MG TABS Take 10 mg by mouth every 6 (six) hours as needed (pain).  02/02/20   [provider]  pantoprazole (PROTONIX) 40 MG tablet Take 1 tablet (40 mg total) by mouth daily. 04/17/20 04/17/21  Skeet Latch, MD  predniSONE (DELTASONE) 10 MG tablet Take 10-50 mg by mouth See admin instructions. Take 50 mg daily for 2 days, then 40 mg daily for 2 days, then 30 mg daily for 2 days, 20 mg daily for 2 days, then take 10 mg daily 02/09/20   [provider]  rivaroxaban (XARELTO) 20 MG TABS tablet Take 1 tablet (20 mg total) by mouth daily with supper. 10/08/16   Skeet Latch, MD    Family History    Family History  Problem Relation Age of Onset  . Alzheimer's disease Mother   . Osteoporosis Mother   . Heart disease Father 66       AMI  . Alcohol abuse Father   . Cancer Brother        colon cancer  . Diabetes Brother   . Kidney failure Brother    He indicated that his mother is deceased. He indicated that his father is deceased. He indicated that his sister is alive. He indicated that only one of his three brothers is alive.  Social History    Social History   Socioeconomic History  . Marital status: Married    Spouse name: Not on file  . Number of children: 1  . Years of education: Not on file  . Highest education level: Not on file  Occupational History  . Occupation: Armed forces technical officer: [employer]    Comment: Paints cars/trucks  Tobacco Use  . Smoking status: Former Smoker    Packs/day: 2.00    Years: 30.00    Pack years: 60.00    Types: Cigarettes    Quit date: 12/28/2008    Years since quitting: 11.3  . Smokeless tobacco: Never Used  . Tobacco comment: quit 12 /2011  Substance and Sexual Activity  . Alcohol use: Yes  Comment: occ  . Drug use: No  . Sexual activity: Yes    Birth  control/protection: Post-menopausal  Other Topics Concern  . Not on file  Social History Narrative   Guns in the home stored in locked cabinet.       Caffeine use: 1 serving/ day.     Marital status:  Married x 25 years, third marriage, happily married.      Children: one son (92); one step-daughter; two grandsons      Lives: with wife. Mother-in-law going to move in in 2017.      Employment:  Works at [employer] on [employer]; [employer] work; happy; 25 hours per week.      Tobacco: none currently; quit 2013.  Smoked x 40 years.      Alcohol:  2-6 beers per night in 2018; much heavier use in fifties.      Drugs: none      Exercise:  None in 2017.      Seatbelt: 100%      ADLs: independent with ADLs in 2017; no assistant devices      Advanced Directives: none in 2017; FULL CODE in 2017; no prolonged measures   Social Determinants of Health   Financial Resource Strain:   . Difficulty of Paying Living Expenses:   Food Insecurity:   . Worried About Charity fundraiser in the Last Year:   . Arboriculturist in the Last Year:   Transportation Needs:   . Film/video editor (Medical):   [name] Lack of Transportation (Non-Medical):   Physical Activity:   . Days of Exercise per Week:   . Minutes of Exercise per Session:   Stress:   . Feeling of Stress :   Social Connections:   . Frequency of Communication with Friends and Family:   . Frequency of Social Gatherings with Friends and Family:   . Attends Religious Services:   . Active Member of Clubs or Organizations:   . Attends Archivist Meetings:   [name] Marital Status:   Intimate Partner Violence:   . Fear of Current or Ex-Partner:   . Emotionally Abused:   [name] Physically Abused:   . Sexually Abused:      Review of Systems    General:  No chills, fever, night sweats or weight changes.  Cardiovascular:  No chest pain, dyspnea on exertion, edema, orthopnea, palpitations, paroxysmal nocturnal dyspnea. Dermatological: No rash,  lesions/masses Respiratory: No cough, dyspnea Urologic: No hematuria, dysuria Abdominal:   No nausea, vomiting, diarrhea, bright red blood per rectum, melena, or hematemesis Neurologic:  No visual changes, wkns, changes in mental status. All other systems reviewed and are otherwise negative except as noted above.  Physical Exam    VS:  BP 122/82   Pulse 74   Temp 98.3 F (36.8 C)   Ht 5\' 8"  (1.727 m)   Wt 197 lb (89.4 kg)   SpO2 96%   BMI 29.95 kg/m  , BMI Body mass index is 29.95 kg/m. GEN: Well nourished, well developed, in no acute distress. HEENT: normal. Neck: Supple, no JVD, carotid bruits, or masses. Cardiac: RRR, no murmurs, rubs, or gallops. No clubbing, cyanosis, edema.  Radials/DP/PT 2+ and equal bilaterally.  Respiratory:  Respirations regular and unlabored, clear to auscultation bilaterally. GI: Soft, nontender, nondistended, BS + x 4. MS: no deformity or atrophy. Skin: warm and dry, no rash. Neuro:  Strength and sensation are intact. Psych: Normal affect.  Accessory Clinical Findings  ECG personally reviewed by me today-normal sinus rhythm possible left atrial enlargement nonspecific ST and T wave abnormality; BPM- No acute changes  EKG 04/09/2020 Normal sinus rhythm nonspecific ST abnormality 76 bpm   Echocardiogram 02/23/2018 Study Conclusions   - Left ventricle: The cavity size was normal. Systolic function was  normal. The estimated ejection fraction was in the range of 55%  to 60%. Wall motion was normal; there were no regional wall  motion abnormalities. Doppler parameters are consistent with  abnormal left ventricular relaxation (grade 1 diastolic  dysfunction).  - Left atrium: The atrium was mildly dilated.  Coronary CTA 03/29/2020 FINDINGS: A 120 kV prospective scan was triggered in the descending thoracic aorta at 111 HU's. Axial non-contrast 3 mm slices were carried out through the heart. The data set was analyzed on a dedicated  work station and scored using the [place]. Gantry rotation speed was 250 msecs and collimation was .6 mm. No beta blockade and 0.8 mg of sl NTG was given. The 3D data set was reconstructed in 5% intervals of the 67-82 % of the R-R cycle. Diastolic phases were analyzed on a dedicated work station using MPR, MIP and VRT modes. The patient received 80 cc of contrast.  Aorta: Mildly dilated. 3.7 mm. Mild calcification in the aortic root and descending aorta. No dissection.  Aortic Valve: Trileaflet.  No calcification.  Coronary Arteries:  Normal coronary origin.  Right dominance.  RCA is a large dominant artery that gives rise to PDA and 2 PLV branches. There is minimum (<25%) calcified plaque proximally and moderate (50-69%) mixed plaque in the distal RCA. There .  Left main is a large artery that gives rise to LAD and LCX arteries.  LAD is a large vessel that has diffuse, scattered plaque. There is mild (25-49%) mostly calcified plaque proximally. There is a focal area of moderate (50-69%) mixed plaque in the mid LAD at the level of D3. There is no plaque is small D1 or D2. D3 is a small vessel with calcified plaque at the ostium. There are also small D4 and D5 vessels without significant plaque.  LCX is a non-dominant artery that gives rise to one large OM1 branch. There is no plaque.  Other findings:  Normal pulmonary vein drainage into the left atrium.  Normal let atrial appendage without a thrombus.  Normal size of the pulmonary artery.  IMPRESSION: 1. Coronary calcium score of 522. This was 72nd percentile for age and sex matched control.  2. Normal coronary origin with right dominance.  3. There is moderate (CAD RADS3) plaque in the mid LAD and distal RCA.  4. There is concern that the mid LAD lesion could be obstructive. Will send study for FFRct.  Cardiac catheterization 04/06/2020  The left ventricular systolic function is normal.  LV  end diastolic pressure is normal.  The left ventricular ejection fraction is 55-65% by visual estimate.  Mid LAD lesion is 80% stenosed.  Post intervention, there is a 0% residual stenosis.  A drug-eluting stent was successfully placed using a STENT RESOLUTE ONYX 3.0X34.  Ost LAD lesion is 30% stenosed.   1.  Severe one-vessel coronary artery disease involving mid LAD with 80% moderately calcified eccentric stenosis.  No other obstructive disease. 2.  Normal LV systolic function and left ventricular end-diastolic pressure. 3.  Successful angioplasty and drug-eluting stent placement to the mid LAD.  PCI indication is class III angina on maximal medical therapy and intermediate risk noninvasive testing.  Recommendations: Xarelto  can be resumed tomorrow. Given that the patient is long-term anticoagulation with Xarelto, recommend clopidogrel monotherapy without aspirin to minimize risk of bleeding. I increased atorvastatin to 40 mg daily. The patient is a candidate for same-day discharge.  Diagnostic Dominance: Right  Intervention     Assessment & Plan   1.  Exertional chest pain/coronary artery disease-no chest pain today.  Underwent cardiac catheterization 04/06/2020 after coronary CT showed a calcium score of 522 and mid LAD/distal RCA lesions.  Cardiac catheterization showed an 80% mid LAD lesion which received PCI and DES x1.  RCA showed minimal plaque. Continue clopidogrel, Xarelto, atorvastatin, amlodipine, losartan, metoprolol Heart healthy low-sodium diet-salty 6 given Increase physical activity as tolerated   Hyperlipidemia-LDL 92 on 01/18/2018.  LDL goal less than 70 Continue atorvastatin 40 Start ezetimibe Heart healthy low-sodium high-fiber diet Increase physical activity as tolerated Repeat lipid panel and LFTs in 8 weeks.  Carotid stenosis-carotid Doppler showed mild bilateral ICA 5/20.  Planned repeat carotid Dopplers 5/22 Continue Xarelto, atorvastatin Start  ezetimibe Heart healthy low-sodium high-fiber diet Increase physical activity as tolerated  Paroxysmal atrial fibrillation-unaware of recurrent episodes of atrial fibrillation.  In the process of getting sleep study.  Mali vas score 2 Continue Xarelto Heart healthy low-sodium diet Avoid triggers caffeine, chocolate, EtOH etc. Increase physical activity as tolerated  Essential hypertension-BP today 122/82.  Well-controlled at home Continue amlodipine, losartan, and metoprolol Heart healthy low-sodium high-fiber diet-salty 6 given Increase physical activity as tolerated  Disposition: Follow-up with Dr. Oval Linsey in 3 months.  Jossie Ng. Pistol Kessenich NP-C    04/18/2020, 2:28 PM [facility] [place] [address] Office [phone] Fax [phone]

## 2020-04-18 ENCOUNTER — Other Ambulatory Visit: Payer: Self-pay

## 2020-04-18 ENCOUNTER — Encounter: Payer: Self-pay | Admitting: General Practice

## 2020-04-18 ENCOUNTER — Ambulatory Visit (INDEPENDENT_AMBULATORY_CARE_PROVIDER_SITE_OTHER): Payer: Medicare Other | Admitting: General Practice

## 2020-04-18 VITALS — BP 122/82 | HR 74 | Temp 98.3°F | Ht 68.0 in | Wt 197.0 lb

## 2020-04-18 DIAGNOSIS — Z955 Presence of coronary angioplasty implant and graft: Secondary | ICD-10-CM

## 2020-04-18 DIAGNOSIS — I480 Paroxysmal atrial fibrillation: Secondary | ICD-10-CM

## 2020-04-18 DIAGNOSIS — I6523 Occlusion and stenosis of bilateral carotid arteries: Secondary | ICD-10-CM

## 2020-04-18 DIAGNOSIS — E7800 Pure hypercholesterolemia, unspecified: Secondary | ICD-10-CM

## 2020-04-18 DIAGNOSIS — I10 Essential (primary) hypertension: Secondary | ICD-10-CM

## 2020-04-18 DIAGNOSIS — Z79899 Other long term (current) drug therapy: Secondary | ICD-10-CM

## 2020-04-18 NOTE — Patient Instructions (Signed)
Medication Instructions:  The current medical regimen is effective;  continue present plan and medications as directed. Please refer to the Current Medication list given to you today. *If you need a refill on your cardiac medications before your next appointment, please call your pharmacy*  Lab Work: FASTING LIPID AND LFT IN 8 WEEKS (06-13-2020) If you have labs (blood work) drawn today and your tests are completely normal, you will receive your results only by:  [place] (if you have MyChart) OR A paper copy in the mail.  If you have any lab test that is abnormal or we need to change your treatment, we will call you to review the results. You may go to any [facility] that is convenient for you however, we do have a lab in our office that is able to assist you. You DO NOT need an appointment for our lab. The lab is open 8:00am and closes at 4:00pm. Lunch 12:45 - 1:45pm.  Special Instructions PLEASE INCREASE PHYSICAL ACTIVITY AS TOLERATED. GOAL IS 150 MIN A WEEK; 30 MIN DAILY/5 DAYS A WEEK  PLEASE READ AND FOLLOW SALTY 6-ATTACHED  Follow-Up: Your next appointment:  3 month(s)  In Person with Skeet Latch, MD  At [facility], you and your health needs are our priority.  As part of our continuing mission to provide you with exceptional heart care, we have created designated Provider Care Teams.  These Care Teams include your primary Cardiologist (physician) and Advanced Practice Providers (APPs -  Physician Assistants and Nurse Practitioners) who all work together to provide you with the care you need, when you need it.

## 2020-04-23 ENCOUNTER — Telehealth (HOSPITAL_COMMUNITY): Payer: Self-pay

## 2020-04-23 NOTE — Telephone Encounter (Signed)
Called and spoke with pt in regards to CR, pt stated he is not interested at this time. States he is doing pretty good.  Closed referral

## 2020-04-23 NOTE — Telephone Encounter (Signed)
Pt insurance is active and benefits verified through Medicare A/B. Co-pay $0.00, DED $203.00/$203.00 met, out of pocket $0.00/$0.00 met, co-insurance 20%. No pre-authorization required. Passport, 04/23/20 @ 9:55AM, [phone]  2ndary insurance is active and benefits verified through [place]. Co-pay $0.00, DED $0.00/$0.00 met, out of pocket $0.00/$0.00 met, co-insurance 0%. No pre-authorization required. Passport, 04/23/20 @ 9:58AM, AFB#[id number]  Will contact patient to see if he is interested in the Cardiac Rehab Program.

## 2020-04-27 ENCOUNTER — Telehealth: Payer: Self-pay | Admitting: Cardiovascular Disease

## 2020-04-27 MED ORDER — CLOPIDOGREL BISULFATE 75 MG PO TABS: 75.0000 mg | ORAL_TABLET | Freq: Every day | ORAL | 3 refills | Status: DC

## 2020-04-27 MED ORDER — PANTOPRAZOLE SODIUM 40 MG PO TBEC: 40.0000 mg | DELAYED_RELEASE_TABLET | Freq: Every day | ORAL | 3 refills | Status: DC

## 2020-04-27 MED ORDER — ATORVASTATIN CALCIUM 40 MG PO TABS: 40.0000 mg | ORAL_TABLET | Freq: Every day | ORAL | 3 refills | Status: DC

## 2020-04-27 MED ORDER — ATORVASTATIN CALCIUM 40 MG PO TABS: 40.0000 mg | ORAL_TABLET | Freq: Every day | ORAL | 3 refills | Status: AC

## 2020-04-27 NOTE — Telephone Encounter (Signed)
New Message   *STAT* If patient is at the pharmacy, call can be transferred to refill team.   1. Which medications need to be refilled? (please list name of each medication and dose if known)  clopidogrel (PLAVIX) 75 MG tablet, pantoprazole (PROTONIX) 40 MG tablet  2. Which pharmacy/location (including street and city if local pharmacy) is medication to be sent to? [id number] [place], [place] - [address]  3. Do they need a 30 day or 90 day supply?  30 day for both  Prescription [facility] [place] [facility] [facility], attn. Domenica Fail. Needs to know why medication was changed and it was due to him having a stint put in

## 2020-04-27 NOTE — Telephone Encounter (Signed)
Left message to call back  

## 2020-04-27 NOTE — Telephone Encounter (Signed)
Advised [facility] and printed to [facility]  Number given to [place], will call Tuesday to get fax number

## 2020-05-22 NOTE — Telephone Encounter (Signed)
Faxed to [phone] on 6-11, confirmation received  Left message for patient to call back and make sure received medication/call from [place]

## 2020-05-30 NOTE — Telephone Encounter (Signed)
Spoke with wife, she is not home. She stated husband did just receive 2 medications but not sure what they are. She will call back if anything further needed

## 2020-06-08 ENCOUNTER — Other Ambulatory Visit: Payer: Self-pay | Admitting: Pharmacist Clinician (PhC)/ Clinical Pharmacy Specialist

## 2020-06-08 MED ORDER — LOSARTAN POTASSIUM 25 MG PO TABS: 25.0000 mg | ORAL_TABLET | Freq: Every day | ORAL | 3 refills | Status: DC

## 2020-06-08 MED ORDER — RIVAROXABAN 20 MG PO TABS: 20.0000 mg | ORAL_TABLET | Freq: Every day | ORAL | 1 refills | Status: AC

## 2020-06-12 ENCOUNTER — Other Ambulatory Visit: Payer: Self-pay | Admitting: Family Medicine

## 2020-06-12 DIAGNOSIS — Z87891 Personal history of nicotine dependence: Secondary | ICD-10-CM

## 2020-06-18 NOTE — Progress Notes (Signed)
Office Visit Note  Patient: David Weaver             Date of Birth: 07-12-1951           MRN: [id number]             PCP: Wardell Honour, MD Referring: Wardell Honour, MD Visit Date: 07/02/2020 Occupation: @GUAROCC @  Subjective:  Medication Management   History of Present Illness: [place] David Weaver is a 69 y.o. male with history of systemic lupus erythematosus.  He states he has been doing well.  He continues to use sunscreen.  He has not had any rash this year.  He has off-and-on discomfort in his joints but he has not noticed any joint swelling.  He continues to have sicca symptoms.  Activities of Daily Living:  Patient reports morning stiffness for 1 hour.   Patient Denies nocturnal pain.  Difficulty dressing/grooming: Denies Difficulty climbing stairs: Denies Difficulty getting out of chair: Denies Difficulty using hands for taps, buttons, cutlery, and/or writing: Denies  Review of Systems  Constitutional: Positive for fatigue. Negative for night sweats.  HENT: Positive for mouth dryness. Negative for mouth sores and nose dryness.   Eyes: Positive for dryness. Negative for redness and itching.  Respiratory: Negative for shortness of breath and difficulty breathing.   Cardiovascular: Negative for chest pain, palpitations, hypertension, irregular heartbeat and swelling in legs/feet.  Gastrointestinal: Negative for blood in stool, constipation and diarrhea.  Endocrine: Negative for increased urination.  Genitourinary: Negative for difficulty urinating.  Musculoskeletal: Positive for arthralgias, joint pain and morning stiffness. Negative for joint swelling, myalgias, muscle weakness, muscle tenderness and myalgias.  Skin: Negative for color change, rash, hair loss, nodules/bumps, redness, skin tightness, ulcers and sensitivity to sunlight.  Allergic/Immunologic: Negative for susceptible to infections.  Neurological: Positive for numbness. Negative for dizziness, fainting,  headaches, memory loss, night sweats and weakness.  Hematological: Positive for bruising/bleeding tendency. Negative for swollen glands.  Psychiatric/Behavioral: Negative for depressed mood, confusion and sleep disturbance. The patient is not nervous/anxious.     PMFS History:  Patient Active Problem List   Diagnosis Date Noted  . Stable angina ([place]) 03/26/2020  . Nausea without vomiting 08/04/2018  . Excessive sweating 08/04/2018  . Systemic involvement of connective tissue ([place]) 06/06/2018  . Fibromyalgia 04/13/2018  . History of total knee replacement, bilateral 04/13/2018  . DDD (degenerative disc disease), lumbar 04/13/2018  . High risk medication use 03/06/2017  . Paroxysmal atrial fibrillation ([place]) 02/19/2016  . Chronic pain syndrome 02/12/2015  . Primary osteoarthritis of both hands   . Esophageal reflux   . Autoimmune disease ([place])   . Hearing loss   . Essential hypertension, benign 11/01/2012  . Other abnormal glucose 11/01/2012  . Depression 11/01/2012  . Insomnia 11/01/2012    Past Medical History:  Diagnosis Date  . Alcohol abuse, daily use   . Allergic rhinitis, cause unspecified   . Angina pectoris ([place]) 03/26/2020  . Atypical mole 10/19/2019   Right Cheek ([place])  . Dysthymic disorder   . Esophageal reflux   . Essential hypertension, benign   . Fibromyalgia    followed by David Weaver every six months.  . Fibromyositis   . Impotence of organic origin   . Insomnia, unspecified   . Left knee DJD   . Lupus erythematosus   . Osteoarthrosis, unspecified whether generalized or localized, lower leg   . Other abnormal glucose   . Other specified disorder of male genital organs(608.89)   .  Systemic lupus erythematosus (David Weaver)   . Unspecified hearing loss   . Unspecified vitamin D deficiency     Family History  Problem Relation Age of Onset  . Alzheimer's disease Mother   . Osteoporosis Mother   . Heart disease Father 59       AMI  . Alcohol abuse  Father   . Cancer Brother        colon cancer  . Diabetes Brother   . Kidney failure Brother    Past Surgical History:  Procedure Laterality Date  . CARPAL TUNNEL RELEASE     bilateral  . CHOLECYSTECTOMY    . COLONOSCOPY  03/01/2014   two polyps; Dr. Benson Norway.  Repeat 5 years.  . CORONARY STENT INTERVENTION N/A 04/06/2020   Procedure: CORONARY STENT INTERVENTION;  Surgeon: Wellington Hampshire, MD;  Location: [facility];  Service: Cardiovascular;  Laterality: N/A;  . GANGLION CYST EXCISION  1980   Left wrist  . JOINT REPLACEMENT  01/2013   right knee, left knee. [place].  [name] KNEE ARTHROPLASTY    . KNEE ARTHROSCOPY  2008   Right  . LEFT HEART CATH AND CORONARY ANGIOGRAPHY N/A 04/06/2020   Procedure: LEFT HEART CATH AND CORONARY ANGIOGRAPHY;  Surgeon: Wellington Hampshire, MD;  Location: [facility];  Service: Cardiovascular;  Laterality: N/A;  . lumbar spine epidural  2009   [facility]  . ROTATOR CUFF REPAIR  10/2008   right  . TOTAL KNEE ARTHROPLASTY  12/27/2012   Procedure: TOTAL KNEE ARTHROPLASTY;  Surgeon: [name], MD;  Location: [facility];  Service: Orthopedics;  Laterality: Right;  RIGHT ARTHROPLASTY KNEE MEDIAL AND LATERAL COMPARTMENTS WITH PATELLA RESURFACING, RIGHT TOTAL KNEE REPLACEMENT  . TOTAL KNEE ARTHROPLASTY Left 01/31/2013   Procedure: TOTAL KNEE ARTHROPLASTY;  Surgeon: [name], MD;  Location: [facility];  Service: Orthopedics;  Laterality: Left;  . TOTAL SHOULDER ARTHROPLASTY     Social History   Social History Narrative   Guns in the home stored in locked cabinet.       Caffeine use: 1 serving/ day.     Marital status:  Married x 25 years, third marriage, happily married.      Children: one son (16); one step-daughter; two grandsons      Lives: with wife. Mother-in-law going to move in in 2017.      Employment:  Works at [employer] on [employer]; [employer] work; happy; 25 hours per week.      Tobacco: none currently; quit 2013.  Smoked x 40 years.       Alcohol:  2-6 beers per night in 2018; much heavier use in fifties.      Drugs: none      Exercise:  None in 2017.      Seatbelt: 100%      ADLs: independent with ADLs in 2017; no assistant devices      Advanced Directives: none in 2017; FULL CODE in 2017; no prolonged measures   Immunization History  Administered Date(s) Administered  . Influenza, Seasonal, Injecte, Preservative Fre 11/01/2012  . Influenza,inj,Quad PF,6+ Mos 11/21/2013, 10/11/2014, 10/03/2015, 09/02/2016, 09/07/2017  . Influenza-Unspecified 08/20/2009, 08/19/2010, 08/12/2011  . Pneumococcal Conjugate-13 02/12/2015  . Pneumococcal Polysaccharide-23 02/19/2016  . Pneumococcal-Unspecified 03/03/2008  . Td 07/01/2001  . Tdap 02/03/2012  . Zoster 08/12/2011     Objective: Vital Signs: BP (!) 133/83 (BP Location: Left Arm, Patient Position: Sitting, Cuff Size: Normal)   Pulse 85   Resp 16  Ht 5\' 7"  (1.702 m)   Wt 202 lb (91.6 kg)   BMI 31.64 kg/m    Physical Exam Vitals and nursing note reviewed.  Constitutional:      Appearance: He is well-developed.  HENT:     Head: Normocephalic and atraumatic.  Eyes:     Conjunctiva/sclera: Conjunctivae normal.     Pupils: Pupils are equal, round, and reactive to light.  Cardiovascular:     Rate and Rhythm: Normal rate and regular rhythm.     Heart sounds: Normal heart sounds.  Pulmonary:     Effort: Pulmonary effort is normal.     Breath sounds: Normal breath sounds.  Abdominal:     General: Bowel sounds are normal.     Palpations: Abdomen is soft.  Musculoskeletal:     Cervical back: Normal range of motion and neck supple.  Skin:    General: Skin is warm and dry.     Capillary Refill: Capillary refill takes less than 2 seconds.  Neurological:     Mental Status: He is alert and oriented to person, place, and time.  Psychiatric:        Behavior: Behavior normal.      Musculoskeletal Exam: C-spine was in good range of motion.  Shoulder joints, elbow joints  were in good range of motion.  He has limited range of motion of bilateral wrist joint with tenosynovitis in his left wrist joint.  He has bilateral MCP thickening and ulnar deviation.  PIP and DIP thickening was noted.  He has good range of motion of his hip joints.  Bilateral knee joints are replaced.  He had no tenderness over ankles or MTPs.  CDAI Exam: CDAI Score: -- Patient Global: --; Provider Global: -- Swollen: --; Tender: -- Joint Exam 07/02/2020   No joint exam has been documented for this visit   There is currently no information documented on the homunculus. Go to the Rheumatology activity and complete the homunculus joint exam.  Investigation: No additional findings.  Imaging: No results found.  Recent Labs: Lab Results  Component Value Date   WBC 3.8 01/30/2020   HGB 14.5 01/30/2020   PLT 207 01/30/2020   NA 138 01/30/2020   K 4.3 01/30/2020   CL 104 01/30/2020   CO2 25 01/30/2020   GLUCOSE 99 01/30/2020   BUN 19 01/30/2020   CREATININE 0.70 03/29/2020   BILITOT 0.9 01/30/2020   ALKPHOS 94 08/04/2018   AST 188 (H) 01/30/2020   ALT 164 (H) 01/30/2020   PROT 6.2 01/30/2020   ALBUMIN 4.3 08/04/2018   CALCIUM 8.7 01/30/2020   GFRAA 109 01/30/2020    Speciality Comments: PLQ Eye Exam 05/10/2020 WNL @ [facility] follow in 1 year  Plaquenil started in 2009.  Procedures:  No procedures performed Allergies: Patient has no known allergies.   Assessment / Plan:     Visit Diagnoses: Other organ or system involvement in systemic lupus erythematosus ([name]) - +ANA, +Ro, +La. history of DLE and sicca -patient continues to have sicca symptoms.  He also has some tenosynovitis in his wrist joints.  He has ulnar deviation.  He has not had any rash.  He has been using sunscreen on a regular basis.  Plaquenil has helped his with the discoid lupus.  Plan: Urinalysis, Routine w reflex microscopic, Anti-DNA antibody, double-stranded, C3 and C4, Sedimentation rate.  A refill  for Plaquenil was called in.  High risk medication use -he is on Plaquenil 200 mg p.o. twice daily.  Plaquenil was a started in two thousand nine.  Need for regular eye exam was emphasized.  He states that the last examination the machine was broken.  Have advised him to contact his ophthalmologist and confirm if he needs repeat eye examination.  He needs OCT evaluation.  Plan: CBC with Differential/Platelet, COMPLETE METABOLIC PANEL WITH GFR today.  Fibromyalgia-he continues to have some generalized pain.  Primary osteoarthritis of both hands-joint protection was discussed.  History of total knee replacement, bilateral-doing well.  DDD (degenerative disc disease), lumbar-he continues to have some lower back pain.  Weight loss diet and exercise was emphasized.  History of depression  History of hypertension  History of gastroesophageal reflux (GERD)  Patient states he is fully vaccinated against COVID-19.   Orders: Orders Placed This Encounter  Procedures  . CBC with Differential/Platelet  . COMPLETE METABOLIC PANEL WITH GFR  . Urinalysis, Routine w reflex microscopic  . Anti-DNA antibody, double-stranded  . C3 and C4  . Sedimentation rate   Meds ordered this encounter  Medications  . hydroxychloroquine (PLAQUENIL) 200 MG tablet    Sig: Take 1 tablet (200 mg total) by mouth 2 (two) times daily.    Dispense:  180 tablet    Refill:  0     Follow-Up Instructions: Return in about 5 months (around 12/02/2020) for Systemic lupus.   Bo Merino, MD  Note - This record has been created using Editor, commissioning.  Chart creation errors have been sought, but may not always  have been located. Such creation errors do not reflect on  the standard of medical care.

## 2020-07-02 ENCOUNTER — Other Ambulatory Visit: Payer: Self-pay

## 2020-07-02 ENCOUNTER — Ambulatory Visit (INDEPENDENT_AMBULATORY_CARE_PROVIDER_SITE_OTHER): Payer: Medicare Other | Admitting: Rheumatology

## 2020-07-02 ENCOUNTER — Encounter: Payer: Self-pay | Admitting: Rheumatology

## 2020-07-02 VITALS — BP 133/83 | HR 85 | Resp 16 | Ht 67.0 in | Wt 202.0 lb

## 2020-07-02 DIAGNOSIS — M19042 Primary osteoarthritis, left hand: Secondary | ICD-10-CM

## 2020-07-02 DIAGNOSIS — I6523 Occlusion and stenosis of bilateral carotid arteries: Secondary | ICD-10-CM

## 2020-07-02 DIAGNOSIS — Z8679 Personal history of other diseases of the circulatory system: Secondary | ICD-10-CM

## 2020-07-02 DIAGNOSIS — Z79899 Other long term (current) drug therapy: Secondary | ICD-10-CM | POA: Diagnosis not present

## 2020-07-02 DIAGNOSIS — M19041 Primary osteoarthritis, right hand: Secondary | ICD-10-CM | POA: Diagnosis not present

## 2020-07-02 DIAGNOSIS — M3219 Other organ or system involvement in systemic lupus erythematosus: Secondary | ICD-10-CM

## 2020-07-02 DIAGNOSIS — M797 Fibromyalgia: Secondary | ICD-10-CM

## 2020-07-02 DIAGNOSIS — Z96653 Presence of artificial knee joint, bilateral: Secondary | ICD-10-CM

## 2020-07-02 DIAGNOSIS — Z8659 Personal history of other mental and behavioral disorders: Secondary | ICD-10-CM

## 2020-07-02 DIAGNOSIS — Z8719 Personal history of other diseases of the digestive system: Secondary | ICD-10-CM

## 2020-07-02 DIAGNOSIS — M5136 Other intervertebral disc degeneration, lumbar region: Secondary | ICD-10-CM

## 2020-07-02 DIAGNOSIS — M359 Systemic involvement of connective tissue, unspecified: Secondary | ICD-10-CM

## 2020-07-02 MED ORDER — HYDROXYCHLOROQUINE SULFATE 200 MG PO TABS: 200.0000 mg | ORAL_TABLET | Freq: Two times a day (BID) | ORAL | 0 refills | Status: DC

## 2020-07-02 NOTE — Patient Instructions (Signed)
   COVID-19 vaccine recommendations:   COVID-19 vaccine is recommended for everyone (unless you are allergic to a vaccine component), even if you are on a medication that suppresses your immune system.   If you are on Methotrexate, Cellcept (mycophenolate), Rinvoq, Xeljanz, and Olumiant- hold the medication for 1 week after each vaccine. Hold Methotrexate for 2 weeks after the single dose COVID-19 vaccine.   If you are on Orencia subcutaneous injection - hold medication one week prior to and one week after the first COVID-19 vaccine dose (only).   If you are on Orencia IV infusions- time vaccination administration so that the first COVID-19 vaccination will occur four weeks after the infusion and postpone the subsequent infusion by one week.   If you are on Cyclophosphamide or Rituxan infusions please contact your doctor prior to receiving the COVID-19 vaccine.   Do not take Tylenol or ant anti-inflammatory medications (NSAIDs) 24 hours prior to the COVID-19 vaccination.   There is no direct evidence about the efficacy of the COVID-19 vaccine in individuals who are on medications that suppress the immune system.   Even if you are fully vaccinated, and you are on any medications that suppress your immune system, please continue to wear a mask, maintain at least six feet social distance and practice hand hygiene.   If you develop a COVID-19 infection, please contact your PCP or our office to determine if you need antibody infusion.  We anticipate that a booster vaccine will be available soon for immunosuppressed individuals. Please cal our office before receiving your booster dose to make adjustments to your medication regimen.  

## 2020-07-03 LAB — CBC WITH DIFFERENTIAL/PLATELET
Absolute Monocytes: 410 cells/uL (ref 200–950)
Basophils Absolute: 38 cells/uL (ref 0–200)
Basophils Relative: 0.5 %
Eosinophils Absolute: 91 cells/uL (ref 15–500)
Eosinophils Relative: 1.2 %
HCT: 44.4 % (ref 38.5–50.0)
Hemoglobin: 14.9 g/dL (ref 13.2–17.1)
Lymphs Abs: 760 cells/uL — ABNORMAL LOW (ref 850–3900)
MCH: 29.4 pg (ref 27.0–33.0)
MCHC: 33.6 g/dL (ref 32.0–36.0)
MCV: 87.6 fL (ref 80.0–100.0)
MPV: 10.1 fL (ref 7.5–12.5)
Monocytes Relative: 5.4 %
Neutro Abs: 6300 cells/uL (ref 1500–7800)
Neutrophils Relative %: 82.9 %
Platelets: 240 10*3/uL (ref 140–400)
RBC: 5.07 10*6/uL (ref 4.20–5.80)
RDW: 13.6 % (ref 11.0–15.0)
Total Lymphocyte: 10 %
WBC: 7.6 10*3/uL (ref 3.8–10.8)

## 2020-07-03 LAB — COMPLETE METABOLIC PANEL WITH GFR
AG Ratio: 2.2 (calc) (ref 1.0–2.5)
ALT: 39 U/L (ref 9–46)
AST: 30 U/L (ref 10–35)
Albumin: 4.3 g/dL (ref 3.6–5.1)
Alkaline phosphatase (APISO): 71 U/L (ref 35–144)
BUN: 19 mg/dL (ref 7–25)
CO2: 23 mmol/L (ref 20–32)
Calcium: 8.7 mg/dL (ref 8.6–10.3)
Chloride: 105 mmol/L (ref 98–110)
Creat: 0.75 mg/dL (ref 0.70–1.18)
GFR, Est African American: 107 mL/min/{1.73_m2} (ref >=60)
GFR, Est Non African American: 92 mL/min/{1.73_m2} (ref >=60)
Globulin: 2 g/dL (calc) (ref 1.9–3.7)
Glucose, Bld: 185 mg/dL — ABNORMAL HIGH (ref 65–139)
Potassium: 4.1 mmol/L (ref 3.5–5.3)
Sodium: 138 mmol/L (ref 135–146)
Total Bilirubin: 0.6 mg/dL (ref 0.2–1.2)
Total Protein: 6.3 g/dL (ref 6.1–8.1)

## 2020-07-03 LAB — URINALYSIS, ROUTINE W REFLEX MICROSCOPIC
Bilirubin Urine: NEGATIVE
Hgb urine dipstick: NEGATIVE
Ketones, ur: NEGATIVE
Leukocytes,Ua: NEGATIVE
Nitrite: NEGATIVE
Protein, ur: NEGATIVE
Specific Gravity, Urine: 1.02 (ref 1.001–1.03)
pH: 5.5 (ref 5.0–8.0)

## 2020-07-03 LAB — SEDIMENTATION RATE: Sed Rate: 2 mm/h (ref 0–20)

## 2020-07-03 LAB — C3 AND C4
C3 Complement: 109 mg/dL (ref 82–185)
C4 Complement: 14 mg/dL — ABNORMAL LOW (ref 15–53)

## 2020-07-03 LAB — ANTI-DNA ANTIBODY, DOUBLE-STRANDED: ds DNA Ab: 5 IU/mL — ABNORMAL HIGH

## 2020-07-03 NOTE — Progress Notes (Signed)
Labs are stable.  Complements are slightly decreased.  We will continue to monitor.    No change in treatment advised.

## 2020-07-09 ENCOUNTER — Ambulatory Visit
Admission: RE | Admit: 2020-07-09 | Discharge: 2020-07-09 | Disposition: A | Discharge status: Home / Self Care | Payer: Medicare Other | Source: Ambulatory Visit | Attending: Family Medicine | Admitting: Family Medicine

## 2020-07-09 ENCOUNTER — Ambulatory Visit: Payer: Medicare Other

## 2020-07-09 DIAGNOSIS — Z87891 Personal history of nicotine dependence: Secondary | ICD-10-CM

## 2020-07-19 ENCOUNTER — Ambulatory Visit: Payer: Medicare Other

## 2020-07-20 ENCOUNTER — Encounter: Payer: Self-pay | Admitting: Cardiovascular Disease

## 2020-07-20 ENCOUNTER — Other Ambulatory Visit: Payer: Self-pay

## 2020-07-20 ENCOUNTER — Ambulatory Visit (INDEPENDENT_AMBULATORY_CARE_PROVIDER_SITE_OTHER): Payer: Medicare Other | Admitting: Cardiovascular Disease

## 2020-07-20 VITALS — BP 122/60 | HR 73 | Temp 97.2°F | Ht 67.0 in | Wt 201.0 lb

## 2020-07-20 DIAGNOSIS — I10 Essential (primary) hypertension: Secondary | ICD-10-CM

## 2020-07-20 DIAGNOSIS — E781 Pure hyperglyceridemia: Secondary | ICD-10-CM

## 2020-07-20 DIAGNOSIS — I6523 Occlusion and stenosis of bilateral carotid arteries: Secondary | ICD-10-CM

## 2020-07-20 DIAGNOSIS — I480 Paroxysmal atrial fibrillation: Secondary | ICD-10-CM | POA: Diagnosis not present

## 2020-07-20 DIAGNOSIS — Z79899 Other long term (current) drug therapy: Secondary | ICD-10-CM

## 2020-07-20 DIAGNOSIS — Z955 Presence of coronary angioplasty implant and graft: Secondary | ICD-10-CM | POA: Insufficient documentation

## 2020-07-20 DIAGNOSIS — E7800 Pure hypercholesterolemia, unspecified: Secondary | ICD-10-CM

## 2020-07-20 HISTORY — DX: Presence of coronary angioplasty implant and graft: Z95.5

## 2020-07-20 HISTORY — DX: Pure hyperglyceridemia: E78.1

## 2020-07-20 HISTORY — DX: Pure hypercholesterolemia, unspecified: E78.00

## 2020-07-20 NOTE — Patient Instructions (Addendum)
Medication Instructions:  Your physician recommends that you continue on your current medications as directed. Please refer to the Current Medication list given to you today.  *If you need a refill on your cardiac medications before your next appointment, please call your pharmacy*  Lab Work: FASTING LP/CMET SOON   If you have labs (blood work) drawn today and your tests are completely normal, you will receive your results only by: [name] MyChart Message (if you have MyChart) OR . A paper copy in the mail If you have any lab test that is abnormal or we need to change your treatment, we will call you to review the results.  Testing/Procedures: Your physician has requested that you have a carotid duplex. This test is an ultrasound of the carotid arteries in your neck. It looks at blood flow through these arteries that supply the brain with blood. Allow one hour for this exam. There are no restrictions or special instructions. PRIOR TO VISIT IN MAY   Follow-Up: At [facility], you and your health needs are our priority.  As part of our continuing mission to provide you with exceptional heart care, we have created designated Provider Care Teams.  These Care Teams include your primary Cardiologist (physician) and Advanced Practice Providers (APPs -  Physician Assistants and Nurse Practitioners) who all work together to provide you with the care you need, when you need it.  We recommend signing up for the patient portal called "MyChart".  Sign up information is provided on this After Visit Summary.  MyChart is used to connect with patients for Virtual Visits (Telemedicine).  Patients are able to view lab/test results, encounter notes, upcoming appointments, etc.  Non-urgent messages can be sent to your provider as well.   To learn more about what you can do with MyChart, go to [email or web address].    Your next appointment:   9 month(s) You will receive a reminder letter in the mail two months in  advance. If you don't receive a letter, please call our office to schedule the follow-up appointment.  The format for your next appointment:   In Person  Provider:   You may see Skeet Latch, MD or one of the following Advanced Practice Providers on your designated Care Team:    Kerin Ransom, PA-C  [place], [place]  Coletta Memos, [place]

## 2020-07-20 NOTE — Progress Notes (Signed)
Cardiology Office Note   Date:  07/20/2020   ID:  David, Weaver [birth year], MRN [id number]  PCP:  Wardell Honour, MD  Cardiologist:   Skeet Latch, MD   Chief Complaint  Patient presents with  . Follow-up     History of Present Illness: David Weaver is a 69 y.o. male with CAD s/p LAD PCI, paroxysmal atrial fibrillation, hypertension, mild carotid stenosis, and lupus who presents for follow up.  David Weaver was seen in clinic on 08/07/15 for a presurgical assessment.  At that time he was cleared for shoulder surgery.  During surgery David Weaver went into atrial fibrillation with RVR.  He reportedly went back into sinus rhythm without intervention. He was started on Eliquis 5mg  daily and referred to follow up with cardiology.  He subsequently had a 30 day event monitor to determine whether he needed to continue on anticoagulation. The monitor did reveal recurrent atrial fibrillation so anticoagulation was continued.  David Weaver reported exertional chest pain.  He was referred for an exercise Myoview 01/2018 that revealed LVEF 46% with global hypokinesis.  There was no ischemia.   He achieved 10.5 METS on a Bruce protocol.  He was then referred for an echo that revealed LVEF 55 to 60% with grade 1 diastolic dysfunction.  At that appointment he also reported dizziness and had carotid Dopplers that revealed 1 to 39% ICA stenosis bilaterally.  At his last appointment David Weaver reported exertional chest pain.  He was referred for a coronary CT-a that revealed significant obstruction in the mid LAD.  He had a left heart cath that revealed 30% ostial LAD and 80% mid LAD that was successfully stented.  He has no more chest pain.  He walks every morning.  He denies chest pain or shortness of breath.  He has no LE edema, orthopnea or PND.  He doesn't check his BP at home.  He struggles with diet. He cooks 3/week and eats out on Fridays.     Past Medical History:  Diagnosis Date  . Alcohol abuse, daily use    . Allergic rhinitis, cause unspecified   . Angina pectoris ([place]) 03/26/2020  . Atypical mole 10/19/2019   Right Cheek (widershave)  . Dysthymic disorder   . Esophageal reflux   . Essential hypertension, benign   . Fibromyalgia    followed by [name] every six months.  . Fibromyositis   . Hypertriglyceridemia 07/20/2020  . Impotence of organic origin   . Insomnia, unspecified   . Left knee DJD   . Lupus erythematosus   . Osteoarthrosis, unspecified whether generalized or localized, lower leg   . Other abnormal glucose   . Other specified disorder of male genital organs(608.89)   . Pure hypercholesterolemia 07/20/2020  . S/P coronary artery stent placement 07/20/2020   LAD PCI 03/2020  . Systemic lupus erythematosus ([place])   . Unspecified hearing loss   . Unspecified vitamin D deficiency     Past Surgical History:  Procedure Laterality Date  . CARPAL TUNNEL RELEASE     bilateral  . CHOLECYSTECTOMY    . COLONOSCOPY  03/01/2014   two polyps; Dr. Benson Norway.  Repeat 5 years.  . CORONARY STENT INTERVENTION N/A 04/06/2020   Procedure: CORONARY STENT INTERVENTION;  Surgeon: Wellington Hampshire, MD;  Location: [facility];  Service: Cardiovascular;  Laterality: N/A;  . GANGLION CYST EXCISION  1980   Left wrist  . JOINT REPLACEMENT  01/2013   right knee,  left knee. [place].  [name] KNEE ARTHROPLASTY    . KNEE ARTHROSCOPY  2008   Right  . LEFT HEART CATH AND CORONARY ANGIOGRAPHY N/A 04/06/2020   Procedure: LEFT HEART CATH AND CORONARY ANGIOGRAPHY;  Surgeon: Wellington Hampshire, MD;  Location: [facility];  Service: Cardiovascular;  Laterality: N/A;  . lumbar spine epidural  2009   [facility]  . ROTATOR CUFF REPAIR  10/2008   right  . TOTAL KNEE ARTHROPLASTY  12/27/2012   Procedure: TOTAL KNEE ARTHROPLASTY;  Surgeon: [name], MD;  Location: [facility];  Service: Orthopedics;  Laterality: Right;  RIGHT ARTHROPLASTY KNEE MEDIAL AND LATERAL COMPARTMENTS WITH PATELLA RESURFACING,  RIGHT TOTAL KNEE REPLACEMENT  . TOTAL KNEE ARTHROPLASTY Left 01/31/2013   Procedure: TOTAL KNEE ARTHROPLASTY;  Surgeon: [name], MD;  Location: [facility];  Service: Orthopedics;  Laterality: Left;  . TOTAL SHOULDER ARTHROPLASTY       Current Outpatient Medications  Medication Sig Dispense Refill  . amLODipine (NORVASC) 10 MG tablet Take 10 mg by mouth daily.    [name] amphetamine-dextroamphetamine (ADDERALL) 20 MG tablet Take 20 mg by mouth every morning.    [name] atorvastatin (LIPITOR) 40 MG tablet Take 1 tablet (40 mg total) by mouth daily. 90 tablet 3  . chlorpheniramine (CHLOR-TRIMETON) 4 MG tablet Take 4 mg by mouth 2 (two) times daily as needed for allergies.    . Cholecalciferol (DIALYVITE VITAMIN D 5000) 125 MCG (5000 UT) capsule Take 5,000 Units by mouth daily.    . clopidogrel (PLAVIX) 75 MG tablet Take 1 tablet (75 mg total) by mouth daily with breakfast. 90 tablet 3  . colestipol (COLESTID) 1 g tablet Take 2 g by mouth 2 (two) times daily.  5  . DULoxetine (CYMBALTA) 60 MG capsule Take 1 capsule (60 mg total) 2 (two) times daily by mouth. 180 capsule 3  . hydrocortisone 2.5 % cream Apply 1 application topically 2 (two) times daily as needed (itching/rash).    . hydroxychloroquine (PLAQUENIL) 200 MG tablet Take 1 tablet (200 mg total) by mouth 2 (two) times daily. 180 tablet 0  . ketoconazole (NIZORAL) 2 % cream Apply 1 application topically 2 (two) times daily as needed (lupus flare).    [name] losartan (COZAAR) 25 MG tablet Take 1 tablet (25 mg total) by mouth daily. 90 tablet 3  . methocarbamol (ROBAXIN) 500 MG tablet Take 500 mg by mouth 3 (three) times daily.     . metoprolol tartrate (LOPRESSOR) 50 MG tablet Take 50 mg by mouth 2 (two) times daily.    . mirtazapine (REMERON) 15 MG tablet Take 15 mg by mouth at bedtime.     . ondansetron (ZOFRAN) 4 MG tablet TAKE 1 TABLET BY MOUTH EVERY 8 HOURS AS NEEDED FOR NAUSEA AND VOMITING 20 tablet 0  . Oxycodone HCl 10 MG TABS Take 10 mg by mouth  every 6 (six) hours as needed (pain).     . pantoprazole (PROTONIX) 40 MG tablet Take 1 tablet (40 mg total) by mouth daily. 90 tablet 3  . rivaroxaban (XARELTO) 20 MG TABS tablet Take 1 tablet (20 mg total) by mouth daily with supper. 90 tablet 1   No current facility-administered medications for this visit.    Allergies:   Patient has no known allergies.    Social History:  The patient  reports that he quit smoking about 11 years ago. His smoking use included cigarettes. He has a 60.00 pack-year smoking history. He has never  used smokeless tobacco. He reports current alcohol use. He reports that he does not use drugs.   Family History:  The patient's family history includes Alcohol abuse in his father; Alzheimer's disease in his mother; Cancer in his brother; Diabetes in his brother; Heart disease (age of onset: 63) in his father; Kidney failure in his brother; Osteoporosis in his mother.    ROS:  Please see the history of present illness.   Otherwise, review of systems are positive for shoulder pain.   All other systems are reviewed and negative.    PHYSICAL EXAM: VS:  BP 122/60   Pulse 73   Temp (!) 97.2 F (36.2 C)   Ht 5\' 7"  (1.702 m)   Wt 201 lb (91.2 kg)   SpO2 93%   BMI 31.48 kg/m  , BMI Body mass index is 31.48 kg/m. GENERAL:  Well appearing HEENT: Pupils equal round and reactive, fundi not visualized, oral mucosa unremarkable NECK:  No jugular venous distention, waveform within normal limits, carotid upstroke brisk and symmetric, no bruits LUNGS:  Clear to auscultation bilaterally HEART:  RRR.  PMI not displaced or sustained,S1 and S2 within normal limits, no S3, no S4, no clicks, no rubs, no murmurs ABD:  Flat, positive bowel sounds normal in frequency in pitch, no bruits, no rebound, no guarding, no midline pulsatile mass, no hepatomegaly, no splenomegaly EXT:  2 plus pulses throughout, no edema, no cyanosis no clubbing SKIN:  No rashes no nodules NEURO:  Cranial  nerves II through XII grossly intact, motor grossly intact throughout PSYCH:  Cognitively intact, oriented to person place and time    EKG:  EKG is ordered today.  04/22/16: Sinus rhythm rate 64 bpm. Nonspecific T wave changes. 01/28/18: Sinus rhythm.  PACs. 8/15;16: Sinus rhythm 93 bpm.  Non-specific t wave changes. 04/05/14: Ectopic atrial rhythm. Non-specific inferior T wave changes. 03/09/20: Sinus rhythm.  Rate 73 bpm.  Lexiscan Myoview 02/10/18:  The left ventricular ejection fraction is mildly decreased (45-54%).  Nuclear stress EF: 46%. Mild generalized hypokinesis  There was no ST segment deviation noted during stress.  The study is normal.  This is a low risk study. No ischemia. Prior ECHO 2016 - normal EF.  Echo 02/23/18: Study Conclusions  - Left ventricle: The cavity size was normal. Systolic function was   normal. The estimated ejection fraction was in the range of 55%   to 60%. Wall motion was normal; there were no regional wall   motion abnormalities. Doppler parameters are consistent with   abnormal left ventricular relaxation (grade 1 diastolic   dysfunction). - Left atrium: The atrium was mildly dilated.  Carotid Doppler 02/10/18: 1-39% ICA stenosis bilaterally   Coronary CT-A 03/2020: IMPRESSION: 1. Coronary calcium score of 522. This was 72nd percentile for age and sex matched control.  2. Normal coronary origin with right dominance.  3. There is moderate (CAD RADS3) plaque in the mid LAD and distal RCA.  4. There is concern that the mid LAD lesion could be obstructive. Will send study for FFRct.  FFR CT 03/30/20: IMPRESSION: 1. CT FFR analysis showed significant stenosis in the mid LAD at the level of D3. 2.  No other significant stenoses were identified.  LHC 04/06/20: Diagnostic Dominance: Right  Intervention     Recent Labs: 07/02/2020: ALT 39; BUN 19; Creat 0.75; Hemoglobin 14.9; Platelets 240; Potassium 4.1; Sodium 138    Lipid  Panel    Component Value Date/Time   CHOL 177 01/18/2018 1016  TRIG 255 (H) 01/18/2018 1016   HDL 34 (L) 01/18/2018 1016   CHOLHDL 5.2 (H) 01/18/2018 1016   CHOLHDL 3.9 09/02/2016 1035   VLDL 43 (H) 09/02/2016 1035   LDLCALC 92 01/18/2018 1016   Total cholesterol 190, triglycerides 440, HDL 50, LDL 71    Wt Readings from Last 3 Encounters:  07/20/20 201 lb (91.2 kg)  07/02/20 202 lb (91.6 kg)  04/18/20 197 lb (89.4 kg)      Other studies Reviewed: Additional studies/ records that were reviewed today include:. Review of the above records demonstrates:  Please see elsewhere in the note.     ASSESSMENT AND PLAN:  # CAD s/p PCI:  David Weaver has underwent LAD PCI for unstable angina on 03/2020.  He will continue aspirin and clopidogrel through 03/2021.  Continue atorvastatin and metoprolol.  When he had lipids checked with his PCP his triglycerides were over 400.  This was several months ago.  I have asked him to work on limiting his carbohydrate intake.  We will repeat lipids and a CMP.  If triglycerides remain significantly elevated we will need to start medication.  # Carotid stenosis: Mild 04/2019.  Repeat 03/2021.  Continue Xarelto and atorvastatin.  Repeat lipids as above.  # Paroxysmal atrial fibrillation:   Initially this was postoperative but he had recurrent atrial fibrillation on an ambulatory monitor.  Continue lifelong anticoagulation with Xarelto.  Continue metoprolol.This patients CHA2DS2-VASc Score and unadjusted Ischemic Stroke Rate (% per year) is equal to 2.2 % stroke rate/year from a score of 2  Above score calculated as 1 point each if present [CHF, HTN, DM, Vascular=MI/PAD/Aortic Plaque, Age if 65-74, or Male] Above score calculated as 2 points each if present [Age > 75, or Stroke/TIA/TE]  # Hypertension: BP well-controlled on amlodipine, losartan and metoprolol.    Current medicines are reviewed at length with the patient today.  The patient does not have  concerns regarding medicines.  The following changes have been made:  no change  Labs/ tests ordered today include:  Orders Placed This Encounter  Procedures  . Lipid panel  . Comprehensive metabolic panel     Disposition:   FU with Dr. Jonelle Sidle C. Oval Linsey 03/2021.  Signed, Skeet Latch, MD  07/20/2020 1:40 PM    [facility]

## 2020-07-24 ENCOUNTER — Other Ambulatory Visit: Payer: Self-pay | Admitting: Cardiovascular Disease

## 2020-07-25 ENCOUNTER — Telehealth: Payer: Self-pay | Admitting: Cardiovascular Disease

## 2020-07-25 NOTE — Telephone Encounter (Signed)
Patient's pharmacy called in regards to medication nitrostat. I informed him I did not see it listed in his current medications.

## 2020-07-30 LAB — COMPREHENSIVE METABOLIC PANEL
ALT: 69 IU/L — ABNORMAL HIGH (ref 0–44)
AST: 56 IU/L — ABNORMAL HIGH (ref 0–40)
Albumin/Globulin Ratio: 1.9 (ref 1.2–2.2)
Albumin: 4.2 g/dL (ref 3.7–4.7)
Alkaline Phosphatase: 109 IU/L (ref 48–121)
BUN/Creatinine Ratio: 23 (ref 10–24)
BUN: 12 mg/dL (ref 8–27)
Bilirubin Total: 0.5 mg/dL (ref 0.0–1.2)
CO2: 24 mmol/L (ref 20–29)
Calcium: 8.8 mg/dL (ref 8.6–10.2)
Chloride: 102 mmol/L (ref 96–106)
Creatinine, Ser: 0.53 mg/dL — ABNORMAL LOW (ref 0.76–1.27)
GFR calc Af Amer: 123 mL/min/{1.73_m2} (ref >59)
GFR calc non Af Amer: 106 mL/min/{1.73_m2} (ref >59)
Globulin, Total: 2.2 g/dL (ref 1.5–4.5)
Glucose: 111 mg/dL — ABNORMAL HIGH (ref 65–99)
Potassium: 4.3 mmol/L (ref 3.5–5.2)
Sodium: 141 mmol/L (ref 134–144)
Total Protein: 6.4 g/dL (ref 6.0–8.5)

## 2020-07-30 LAB — LIPID PANEL
Chol/HDL Ratio: 2.8 ratio (ref 0.0–5.0)
Cholesterol, Total: 161 mg/dL (ref 100–199)
HDL: 57 mg/dL (ref >39)
LDL Chol Calc (NIH): 53 mg/dL (ref 0–99)
Triglycerides: 335 mg/dL — ABNORMAL HIGH (ref 0–149)
VLDL Cholesterol Cal: 51 mg/dL — ABNORMAL HIGH (ref 5–40)

## 2020-09-30 ENCOUNTER — Other Ambulatory Visit: Payer: Self-pay | Admitting: Rheumatology

## 2020-09-30 DIAGNOSIS — M359 Systemic involvement of connective tissue, unspecified: Secondary | ICD-10-CM

## 2020-10-01 NOTE — Telephone Encounter (Signed)
Last Visit: 07/02/2020 Next Visit: 12/03/2020 Labs: 07/02/2020 stable Eye exam: 05/10/2020 WNL   Current Dose per office note 07/02/2020: Plaquenil 200 mg p.o. twice daily DX: Other organ or system involvement in systemic lupus erythematosus   Okay to refill per Dr. Estanislado Pandy

## 2020-10-11 ENCOUNTER — Ambulatory Visit (INDEPENDENT_AMBULATORY_CARE_PROVIDER_SITE_OTHER): Payer: Medicare Other | Admitting: Internal Medicine

## 2020-10-11 ENCOUNTER — Other Ambulatory Visit: Payer: Self-pay

## 2020-10-11 ENCOUNTER — Encounter: Payer: Self-pay | Admitting: Internal Medicine

## 2020-10-11 DIAGNOSIS — R0600 Dyspnea, unspecified: Secondary | ICD-10-CM

## 2020-10-11 DIAGNOSIS — I6523 Occlusion and stenosis of bilateral carotid arteries: Secondary | ICD-10-CM | POA: Diagnosis not present

## 2020-10-11 DIAGNOSIS — J449 Chronic obstructive pulmonary disease, unspecified: Secondary | ICD-10-CM

## 2020-10-11 DIAGNOSIS — R0609 Other forms of dyspnea: Secondary | ICD-10-CM

## 2020-10-11 NOTE — Assessment & Plan Note (Addendum)
Onset late spring 2021 - Spirometry 10/11/2014  FEV1 2.47 (84%)  Ratio 0.75 with nl f/v   - LHC 04/06/20 with nl pressures,  LAD stenosis 80% to 0  p stent  - See ct chest 07/09/20 c/w scarring both bases  -  10/11/2020   Walked RA  2 laps @ approx 237ft each @ moderate to fast  pace  stopped due to end of study, sats low 90s     Advised : Make sure you check your oxygen saturations at highest level of activity to be sure it stays over 90% and keep track of it at least once a week, more often if breathing getting worse, and let me know if losing ground.    Strongly doubt he has copd based on the fletcher principle (spirometry nl p stopped smoking not likely to show much copd now)  but will do pfts and if not making progress with regular sub aerobic ex and no explanation by pfts will rec CPST next          Each maintenance medication was reviewed in detail including emphasizing most importantly the difference between maintenance and prns and under what circumstances the prns are to be triggered using an action plan format where appropriate.  Total time for H and P, chart review, counseling,  directly observing portions of ambulatory 02 saturation study/  and generating customized AVS unique to this office visit / charting =45 min

## 2020-10-11 NOTE — Patient Instructions (Signed)
We will have you sign a release so we can get Dr Ulyses Amor note explaining why you are on prednisone    We will do full pfts to prove you do not have any significant copd and call you with the results   In meantime,  Make sure you check your oxygen saturations at highest level of activity (right before you get to the shop) to be sure it stays over 90% and keep track of it at least once a week, more often if breathing getting worse, and let me know if losing ground.  Pulmonary follow up can be as needed once we get your lung functions done (PFTs)

## 2020-10-11 NOTE — Assessment & Plan Note (Addendum)
Quit smoking 2010 - Spirometry 10/11/2014  FEV1 2.47 (84%)  Ratio 0.75   -  Mild changes of centrilobular emphysema  on CT   07/09/20   >>> rec PFTs to be complete

## 2020-10-11 NOTE — Progress Notes (Signed)
David Weaver, male    DOB: [birth year],  MRN: [id number]   Brief patient profile:  35 yowm quit smoking 12/2008 due to "peer pressure", no resp symptoms but  Late spring 2021 new onset doe  from shop to house x 150 ft with incline back to house s cp  and stent done 04/06/20 for one episode CP with nl LV and pressures which helped doe  but still not back to nl so referred to pulmonary clinic 10/11/2020 by Dr   Loyola Mast with wife apparently concerned he may have copd     History of Present Illness  10/11/2020  Pulmonary/ 1st office eval/Shaniah Baltes  Chief Complaint  Patient presents with  . Consult    SHOB with activity---walking on flat land is ok, but he struggles when he has to walk on an incline--from his shop to the house.    Dyspnea:   MMRC1 = can walk nl pace, flat grade, can't hurry or go uphills or steps s sob   Cough: cough Sleep: bed is flat/ one pillow  SABA use: none 02 none On prednisone 10 mg daily ? For how long by [facility] for abn LFTs   No ex cp   No obvious day to day or daytime variability or assoc excess/ purulent sputum or mucus plugs or hemoptysis or  chest tightness, subjective wheeze or overt sinus or hb symptoms.   Sleeping  without nocturnal  or early am exacerbation  of respiratory  c/o's or need for noct saba. Also denies any obvious fluctuation of symptoms with weather or environmental changes or other aggravating or alleviating factors except as outlined above   No unusual exposure hx or h/o childhood pna/ asthma or knowledge of premature birth.  Current Allergies, Complete Past Medical History, Past Surgical History, Family History, and Social History were reviewed in [employer] record.  ROS  The following are not active complaints unless bolded Hoarseness, sore throat, dysphagia, dental problems, itching, sneezing,  nasal congestion or discharge of excess mucus or purulent secretions, ear ache,   fever, chills, sweats, unintended wt loss or  wt gain, classically pleuritic or exertional cp,  orthopnea pnd or arm/hand swelling  or leg swelling, presyncope, palpitations, abdominal pain, anorexia, nausea, vomiting, diarrhea  or change in bowel habits or change in bladder habits, change in stools or change in urine, dysuria, hematuria,  rash, arthralgias, visual complaints, headache, numbness, weakness or ataxia or problems with walking or coordination,  change in mood or  memory.          Past Medical History:  Diagnosis Date  . Alcohol abuse, daily use   . Allergic rhinitis, cause unspecified   . Angina pectoris ([place]) 03/26/2020  . Atypical mole 10/19/2019   Right Cheek (widershave)  . Dysthymic disorder   . Esophageal reflux   . Essential hypertension, benign   . Fibromyalgia    followed by [name] every six months.  . Fibromyositis   . Hypertriglyceridemia 07/20/2020  . Impotence of organic origin   . Insomnia, unspecified   . Left knee DJD   . Lupus erythematosus   . Osteoarthrosis, unspecified whether generalized or localized, lower leg   . Other abnormal glucose   . Other specified disorder of male genital organs(608.89)   . Pure hypercholesterolemia 07/20/2020  . S/P coronary artery stent placement 07/20/2020   LAD PCI 03/2020  . Systemic lupus erythematosus ([place])   . Unspecified hearing loss   . Unspecified vitamin D deficiency  Outpatient Medications Prior to Visit  Medication Sig Dispense Refill  . amLODipine (NORVASC) 10 MG tablet Take 10 mg by mouth daily.    [name] amphetamine-dextroamphetamine (ADDERALL) 20 MG tablet Take 20 mg by mouth every morning.    [name] atorvastatin (LIPITOR) 40 MG tablet Take 1 tablet (40 mg total) by mouth daily. 90 tablet 3  . chlorpheniramine (CHLOR-TRIMETON) 4 MG tablet Take 4 mg by mouth 2 (two) times daily as needed for allergies.    . Cholecalciferol (DIALYVITE VITAMIN D 5000) 125 MCG (5000 UT) capsule Take 5,000 Units by mouth daily.    . clopidogrel (PLAVIX) 75 MG tablet TAKE 1  TABLET BY MOUTH DAILY 90 tablet 3  . colestipol (COLESTID) 1 g tablet Take 2 g by mouth 2 (two) times daily.  5  . DULoxetine (CYMBALTA) 60 MG capsule Take 1 capsule (60 mg total) 2 (two) times daily by mouth. 180 capsule 3  . glucose blood (PRECISION QID TEST) test strip 1 each (1 strip total) by XX route once daily Dx: E11.9    . hydrocortisone 2.5 % cream Apply 1 application topically 2 (two) times daily as needed (itching/rash).    . hydroxychloroquine (PLAQUENIL) 200 MG tablet TAKE 1 TABLET BY MOUTH TWICE A DAY 180 tablet 0  . ketoconazole (NIZORAL) 2 % cream Apply 1 application topically 2 (two) times daily as needed (lupus flare).    [name] losartan (COZAAR) 25 MG tablet Take 1 tablet (25 mg total) by mouth daily. 90 tablet 3  . metFORMIN (GLUCOPHAGE) 500 MG tablet Take by mouth.    . methocarbamol (ROBAXIN) 500 MG tablet Take 500 mg by mouth 3 (three) times daily.     . metoprolol tartrate (LOPRESSOR) 50 MG tablet Take 50 mg by mouth 2 (two) times daily.    . mirtazapine (REMERON) 15 MG tablet Take 15 mg by mouth at bedtime.     . ondansetron (ZOFRAN) 4 MG tablet TAKE 1 TABLET BY MOUTH EVERY 8 HOURS AS NEEDED FOR NAUSEA AND VOMITING 20 tablet 0  . ONETOUCH ULTRA test strip 1 each daily.    . Oxycodone HCl 10 MG TABS Take 10 mg by mouth every 6 (six) hours as needed (pain).     . pantoprazole (PROTONIX) 40 MG tablet TAKE 1 TABLET BY MOUTH DAILY 90 tablet 1  . predniSONE (DELTASONE) 10 MG tablet Take 10 mg by mouth daily.    . pregabalin (LYRICA) 50 MG capsule Take 50 mg by mouth 2 (two) times daily.    . rivaroxaban (XARELTO) 20 MG TABS tablet Take 1 tablet (20 mg total) by mouth daily with supper. 90 tablet 1   No facility-administered medications prior to visit.     Objective:     BP 118/60   Pulse 73   Temp (!) 97 F (36.1 C) (Oral)   Ht 5\' 7"  (1.702 m)   Wt 195 lb 8 oz (88.7 kg)   SpO2 97%   BMI 30.62 kg/m   SpO2: 97 %   Wt Readings from Last 3 Encounters:  10/11/20 195  lb 8 oz (88.7 kg)  07/20/20 201 lb (91.2 kg)  07/02/20 202 lb (91.6 kg)  03/15/20           192        Very figety amb mod obese  wm can't seem to keep still   HEENT : pt wearing mask not removed for exam due to covid - 19 concerns.   NECK :  without  JVD/Nodes/TM/ nl carotid upstrokes bilaterally   LUNGS: no acc muscle use,  Min barrel  contour chest wall with bilateral  slightly decreased bs s audible wheeze and  without cough on insp or exp maneuvers and min  Hyperresonant  to  percussion bilaterally     CV:  RRR  no s3 or murmur or increase in P2, and no edema   ABD: mod obese  soft and nontender with pos end  insp Hoover's  in the supine position. No bruits or organomegaly appreciated, bowel sounds nl  MS:   Nl gait/  ext warm without deformities, calf tenderness, cyanosis or clubbing No obvious joint restrictions   SKIN: warm and dry without lesions    NEURO:  alert, approp, nl sensorium with  no motor or cerebellar deficits apparent.        I personally reviewed images and agree with radiology impression as follows:   Chest CT = LDSCT  07/09/20  Mild changes of centrilobular emphysema. Subpleural scarring, parenchymal banding and volume loss is noted within the paravertebral lower lobes, right greater than left. Linear areas of scarring are also noted within the right middle lobe and lingula. There are 2 nodules identified within the left lung. The largest is in the subpleural aspect of the medial left lower lobe with an equivalent diameter of 5.3 mm.   Labs reviewed by Dr [name] 08/2020 including nl cbc and tsh     Assessment   DOE (dyspnea on exertion) Onset late spring 2021 - Spirometry 10/11/2014  FEV1 2.47 (84%)  Ratio 0.75 with nl f/v   - LHC 04/06/20 with nl pressures,  LAD stenosis 80% to 0  p stent  - See ct chest 07/09/20 c/w scarring both bases  -  10/11/2020   Walked RA  2 laps @ approx 28ft each @ moderate to fast  pace  stopped due to end of study, sats  low 90s     Advised : Make sure you check your oxygen saturations at highest level of activity to be sure it stays over 90% and keep track of it at least once a week, more often if breathing getting worse, and let me know if losing ground.    Strongly doubt he has copd based on the fletcher principle (spirometry nl p stopped smoking not likely to show much copd now)  but will do pfts and if not making progress with regular sub aerobic ex and no explanation by pfts will rec CPST next           COPD GOLD 0  Quit smoking 2010 - Spirometry 10/11/2014  FEV1 2.47 (84%)  Ratio 0.75   -  Mild changes of centrilobular emphysema  on CT   07/09/20  >>> rec PFTs to be complete   Each maintenance medication was reviewed in detail including emphasizing most importantly the difference between maintenance and prns and under what circumstances the prns are to be triggered using an action plan format where appropriate.  Total time for H and P, chart review, counseling,  directly observing portions of ambulatory 02 saturation study/  and generating customized AVS unique to this office visit / charting =45 min             Christinia Gully, MD 10/11/2020

## 2020-10-12 ENCOUNTER — Telehealth: Payer: Self-pay | Admitting: *Deleted

## 2020-10-12 DIAGNOSIS — I10 Essential (primary) hypertension: Secondary | ICD-10-CM

## 2020-10-12 DIAGNOSIS — E7800 Pure hypercholesterolemia, unspecified: Secondary | ICD-10-CM

## 2020-10-12 DIAGNOSIS — Z5181 Encounter for therapeutic drug level monitoring: Secondary | ICD-10-CM

## 2020-10-12 NOTE — Telephone Encounter (Addendum)
Patient never called back  Spoke with patient and he stated he has time remember things and ask that I call his wife at [phone] Per wife patient has been put on Metformin, watching diet better, and has lost 9 lbs Will forward to Dr Oval Linsey for review to confirm still wants to continue with plan without repeat labs

## 2020-10-12 NOTE — Telephone Encounter (Signed)
-----   Message from Skeet Latch, MD sent at 08/03/2020  4:43 PM EDT ----- Cholesterol is pretty good but triglycerides remain high and LFTs are a little elevated.  Recommend reducing atorvastatin to 20mg .  Add Vascepa for triglycerides.  2g bid.  Repeat lipids/CMP in 2 months.

## 2020-10-14 NOTE — Telephone Encounter (Signed)
OK.  We can wait on the Vascepa and just recheck his labs in a couple months.

## 2020-10-15 NOTE — Telephone Encounter (Signed)
Advised wife, verbalized understanding. Lab orders placed and mailed to patient

## 2020-10-20 ENCOUNTER — Other Ambulatory Visit: Payer: Self-pay | Admitting: Rheumatology

## 2020-10-20 DIAGNOSIS — M359 Systemic involvement of connective tissue, unspecified: Secondary | ICD-10-CM

## 2020-10-22 ENCOUNTER — Ambulatory Visit (INDEPENDENT_AMBULATORY_CARE_PROVIDER_SITE_OTHER): Payer: Medicare Other | Admitting: Podiatry

## 2020-10-22 ENCOUNTER — Other Ambulatory Visit: Payer: Self-pay

## 2020-10-22 ENCOUNTER — Ambulatory Visit (INDEPENDENT_AMBULATORY_CARE_PROVIDER_SITE_OTHER): Payer: Medicare Other

## 2020-10-22 ENCOUNTER — Encounter: Payer: Self-pay | Admitting: Podiatry

## 2020-10-22 DIAGNOSIS — R52 Pain, unspecified: Secondary | ICD-10-CM

## 2020-10-22 DIAGNOSIS — M778 Other enthesopathies, not elsewhere classified: Secondary | ICD-10-CM

## 2020-10-22 DIAGNOSIS — I6523 Occlusion and stenosis of bilateral carotid arteries: Secondary | ICD-10-CM | POA: Diagnosis not present

## 2020-10-22 MED ORDER — TRIAMCINOLONE ACETONIDE 10 MG/ML IJ SUSP
5.0000 mg | Freq: Once | INTRAMUSCULAR | Status: AC
  Administered 2020-10-22: 5 mg

## 2020-10-28 NOTE — Progress Notes (Signed)
Subjective:   Patient ID: David Weaver, male   DOB: 69 y.o.   MRN: [id number]   HPI Patient presents stating he has been getting quite a bit of pain on the top of his left foot and is not sure if it may be a fracture or other injury and states that he does not remember specific injury.  Patient does not smoke at the current time and likes to be active   Review of Systems  All other systems reviewed and are negative.       Objective:  Physical Exam Vitals and nursing note reviewed.  Constitutional:      Appearance: He is well-developed.  Pulmonary:     Effort: Pulmonary effort is normal.  Musculoskeletal:        General: Normal range of motion.  Skin:    General: Skin is warm.  Neurological:     Mental Status: He is alert.     Neurovascular status intact muscle strength adequate range of motion within normal limits with patient found to have discomfort with pain inflammation fluid buildup on the dorsum of the left foot that is painful when pressed and makes walking difficult.  Patient has mild edema associated with it but it is spread over a fairly large geographical pattern around the extensor tendon complex and patient has good digital perfusion well oriented x3     Assessment:  Inflammatory extensor tendinitis left     Plan:  H&P reviewed condition and recommended careful conservative treatment and did sterile prep injected the extensor complex 3 mg dexamethasone Kenalog 5 mg Xylocaine advised ice therapy anti-inflammatories and rigid bottom shoes.  Reappoint to recheck  X-rays are negative for signs of fracture or bone pathology

## 2020-11-05 ENCOUNTER — Ambulatory Visit (INDEPENDENT_AMBULATORY_CARE_PROVIDER_SITE_OTHER): Payer: Medicare Other | Admitting: Podiatry

## 2020-11-05 ENCOUNTER — Other Ambulatory Visit: Payer: Self-pay

## 2020-11-05 ENCOUNTER — Encounter: Payer: Self-pay | Admitting: Podiatry

## 2020-11-05 DIAGNOSIS — M779 Enthesopathy, unspecified: Secondary | ICD-10-CM

## 2020-11-05 MED ORDER — TRIAMCINOLONE ACETONIDE 10 MG/ML IJ SUSP
10.0000 mg | Freq: Once | INTRAMUSCULAR | Status: AC
  Administered 2020-11-05: 10 mg

## 2020-11-07 NOTE — Progress Notes (Signed)
Subjective:   Patient ID: David Weaver, male   DOB: 69 y.o.   MRN: [id number]   HPI Patient states doing pretty well with improvement but does have discomfort into his left ankle that is hard for him to walk on   ROS      Objective:  Physical Exam  Neurovascular status intact with patient's left sinus tarsi found to be currently inflamed     Assessment:  Sinus tarsitis left inflamed     Plan:  Went ahead today did sterile prep and injected the sinus tarsi left 3 mg Kenalog 5 mg Xylocaine advised on support therapy and reappoint as needed and will probably need routine care at times in the future

## 2020-11-19 ENCOUNTER — Other Ambulatory Visit: Payer: Self-pay | Admitting: Internal Medicine

## 2020-11-19 DIAGNOSIS — J449 Chronic obstructive pulmonary disease, unspecified: Secondary | ICD-10-CM

## 2020-11-19 NOTE — Progress Notes (Deleted)
Office Visit Note  Patient: David Weaver             Date of Birth: 12-10-1950           MRN: [id number]             PCP: Wardell Honour, MD Referring: Wardell Honour, MD Visit Date: 12/03/2020 Occupation: @GUAROCC @  Subjective:  No chief complaint on file.   History of Present Illness: David Weaver is a 69 y.o. male ***   Activities of Daily Living:  Patient reports morning stiffness for *** {minute/hour:19697}.   Patient {ACTIONS;DENIES/REPORTS:21021675::"Denies"} nocturnal pain.  Difficulty dressing/grooming: {ACTIONS;DENIES/REPORTS:21021675::"Denies"} Difficulty climbing stairs: {ACTIONS;DENIES/REPORTS:21021675::"Denies"} Difficulty getting out of chair: {ACTIONS;DENIES/REPORTS:21021675::"Denies"} Difficulty using hands for taps, buttons, cutlery, and/or writing: {ACTIONS;DENIES/REPORTS:21021675::"Denies"}  No Rheumatology ROS completed.   PMFS History:  Patient Active Problem List   Diagnosis Date Noted  . DOE (dyspnea on exertion) 10/11/2020  . COPD GOLD 0  10/11/2020  . S/P coronary artery stent placement 07/20/2020  . Hypertriglyceridemia 07/20/2020  . Pure hypercholesterolemia 07/20/2020  . Nausea without vomiting 08/04/2018  . Excessive sweating 08/04/2018  . Systemic involvement of connective tissue ([place]) 06/06/2018  . Fibromyalgia 04/13/2018  . History of total knee replacement, bilateral 04/13/2018  . DDD (degenerative disc disease), lumbar 04/13/2018  . High risk medication use 03/06/2017  . Paroxysmal atrial fibrillation ([place]) 02/19/2016  . Chronic pain syndrome 02/12/2015  . Primary osteoarthritis of both hands   . Esophageal reflux   . Autoimmune disease ([place])   . Hearing loss   . Essential hypertension, benign 11/01/2012  . Other abnormal glucose 11/01/2012  . Depression 11/01/2012  . Insomnia 11/01/2012    Past Medical History:  Diagnosis Date  . Alcohol abuse, daily use   . Allergic rhinitis, cause unspecified   . Angina pectoris ([place])  03/26/2020  . Atypical mole 10/19/2019   Right Cheek (widershave)  . Dysthymic disorder   . Esophageal reflux   . Essential hypertension, benign   . Fibromyalgia    followed by [name] every six months.  . Fibromyositis   . Hypertriglyceridemia 07/20/2020  . Impotence of organic origin   . Insomnia, unspecified   . Left knee DJD   . Lupus erythematosus   . Osteoarthrosis, unspecified whether generalized or localized, lower leg   . Other abnormal glucose   . Other specified disorder of male genital organs(608.89)   . Pure hypercholesterolemia 07/20/2020  . S/P coronary artery stent placement 07/20/2020   LAD PCI 03/2020  . Systemic lupus erythematosus ([place])   . Unspecified hearing loss   . Unspecified vitamin D deficiency     Family History  Problem Relation Age of Onset  . Alzheimer's disease Mother   . Osteoporosis Mother   . Heart disease Father 58       AMI  . Alcohol abuse Father   . Cancer Brother        colon cancer  . Diabetes Brother   . Kidney failure Brother    Past Surgical History:  Procedure Laterality Date  . CARPAL TUNNEL RELEASE     bilateral  . CHOLECYSTECTOMY    . COLONOSCOPY  03/01/2014   two polyps; Dr. Benson Norway.  Repeat 5 years.  . CORONARY STENT INTERVENTION N/A 04/06/2020   Procedure: CORONARY STENT INTERVENTION;  Surgeon: Wellington Hampshire, MD;  Location: [facility];  Service: Cardiovascular;  Laterality: N/A;  . GANGLION CYST EXCISION  1980   Left wrist  . JOINT REPLACEMENT  01/2013   right knee, left knee. Wainer.  [name] KNEE ARTHROPLASTY    . KNEE ARTHROSCOPY  2008   Right  . LEFT HEART CATH AND CORONARY ANGIOGRAPHY N/A 04/06/2020   Procedure: LEFT HEART CATH AND CORONARY ANGIOGRAPHY;  Surgeon: Wellington Hampshire, MD;  Location: [facility];  Service: Cardiovascular;  Laterality: N/A;  . lumbar spine epidural  2009   [facility]  . ROTATOR CUFF REPAIR  10/2008   right  . TOTAL KNEE ARTHROPLASTY  12/27/2012   Procedure: TOTAL  KNEE ARTHROPLASTY;  Surgeon: [name], MD;  Location: [facility];  Service: Orthopedics;  Laterality: Right;  RIGHT ARTHROPLASTY KNEE MEDIAL AND LATERAL COMPARTMENTS WITH PATELLA RESURFACING, RIGHT TOTAL KNEE REPLACEMENT  . TOTAL KNEE ARTHROPLASTY Left 01/31/2013   Procedure: TOTAL KNEE ARTHROPLASTY;  Surgeon: [name], MD;  Location: [facility];  Service: Orthopedics;  Laterality: Left;  . TOTAL SHOULDER ARTHROPLASTY     Social History   Social History Narrative   Guns in the home stored in locked cabinet.       Caffeine use: 1 serving/ day.     Marital status:  Married x 25 years, third marriage, happily married.      Children: one son (26); one step-daughter; two grandsons      Lives: with wife. Mother-in-law going to move in in 2017.      Employment:  Works at [employer] on [employer]; [employer] work; happy; 25 hours per week.      Tobacco: none currently; quit 2013.  Smoked x 40 years.      Alcohol:  2-6 beers per night in 2018; much heavier use in fifties.      Drugs: none      Exercise:  None in 2017.      Seatbelt: 100%      ADLs: independent with ADLs in 2017; no assistant devices      Advanced Directives: none in 2017; FULL CODE in 2017; no prolonged measures   Immunization History  Administered Date(s) Administered  . Influenza, High Dose Seasonal PF 09/17/2020  . Influenza, Seasonal, Injecte, Preservative Fre 11/01/2012  . Influenza,inj,Quad PF,6+ Mos 11/21/2013, 10/11/2014, 10/03/2015, 09/02/2016, 09/07/2017  . Influenza-Unspecified 08/20/2009, 08/19/2010, 08/12/2011, 11/21/2013, 10/11/2014, 10/03/2015, 09/02/2016, 09/07/2017  . PFIZER SARS-COV-2 Vaccination 01/06/2020, 01/27/2020, 07/25/2020  . Pneumococcal Conjugate-13 02/12/2015  . Pneumococcal Polysaccharide-23 02/19/2016  . Pneumococcal-Unspecified 03/03/2008  . Td 03/22/2001, 07/01/2001  . Tdap 02/03/2012  . Zoster 08/12/2011  . Zoster Recombinat (Shingrix) 11/18/2018     Objective: Vital Signs: There were no  vitals taken for this visit.   Physical Exam   Musculoskeletal Exam: ***  CDAI Exam: CDAI Score: -- Patient Global: --; Provider Global: -- Swollen: --; Tender: -- Joint Exam 12/03/2020   No joint exam has been documented for this visit   There is currently no information documented on the homunculus. Go to the Rheumatology activity and complete the homunculus joint exam.  Investigation: No additional findings.  Imaging: No results found.  Recent Labs: Lab Results  Component Value Date   WBC 7.6 07/02/2020   HGB 14.9 07/02/2020   PLT 240 07/02/2020   NA 141 07/30/2020   K 4.3 07/30/2020   CL 102 07/30/2020   CO2 24 07/30/2020   GLUCOSE 111 (H) 07/30/2020   BUN 12 07/30/2020   CREATININE 0.53 (L) 07/30/2020   BILITOT 0.5 07/30/2020   ALKPHOS 109 07/30/2020   AST 56 (H) 07/30/2020   ALT 69 (H) 07/30/2020  PROT 6.4 07/30/2020   ALBUMIN 4.2 07/30/2020   CALCIUM 8.8 07/30/2020   GFRAA 123 07/30/2020    Speciality Comments: PLQ Eye Exam 05/10/2020 WNL @ [facility] follow in 1 year 06/11/2020 WNL @ [facility], [facility]  Plaquenil started in 2009.  Procedures:  No procedures performed Allergies: Patient has no known allergies.   Assessment / Plan:     Visit Diagnoses: No diagnosis found.  Orders: No orders of the defined types were placed in this encounter.  No orders of the defined types were placed in this encounter.   Face-to-face time spent with patient was *** minutes. Greater than 50% of time was spent in counseling and coordination of care.  Follow-Up Instructions: No follow-ups on file.   Earnestine Mealing, CMA  Note - This record has been created using Editor, commissioning.  Chart creation errors have been sought, but may not always  have been located. Such creation errors do not reflect on  the standard of medical care.

## 2020-11-20 ENCOUNTER — Other Ambulatory Visit: Payer: Self-pay

## 2020-11-20 ENCOUNTER — Ambulatory Visit (INDEPENDENT_AMBULATORY_CARE_PROVIDER_SITE_OTHER): Payer: Medicare Other | Admitting: Internal Medicine

## 2020-11-20 DIAGNOSIS — J449 Chronic obstructive pulmonary disease, unspecified: Secondary | ICD-10-CM | POA: Diagnosis not present

## 2020-11-20 LAB — PULMONARY FUNCTION TEST
DL/VA % pred: 105 %
DL/VA: 4.34 ml/min/mmHg/L
DLCO cor % pred: 86 %
DLCO cor: 19.74 ml/min/mmHg
DLCO unc % pred: 86 %
DLCO unc: 19.74 ml/min/mmHg
FEF 25-75 Post: 1.8 L/sec
FEF 25-75 Pre: 2.61 L/sec
FEF2575-%Change-Post: -31 %
FEF2575-%Pred-Post: 87 %
FEF2575-%Pred-Pre: 126 %
FEV1-%Change-Post: -6 %
FEV1-%Pred-Post: 82 %
FEV1-%Pred-Pre: 87 %
FEV1-Post: 2.24 L
FEV1-Pre: 2.38 L
FEV1FVC-%Change-Post: 0 %
FEV1FVC-%Pred-Pre: 111 %
FEV6-%Change-Post: -6 %
FEV6-%Pred-Post: 77 %
FEV6-%Pred-Pre: 82 %
FEV6-Post: 2.72 L
FEV6-Pre: 2.89 L
FEV6FVC-%Change-Post: 0 %
FEV6FVC-%Pred-Post: 106 %
FEV6FVC-%Pred-Pre: 106 %
FVC-%Change-Post: -6 %
FVC-%Pred-Post: 73 %
FVC-%Pred-Pre: 77 %
FVC-Post: 2.73 L
FVC-Pre: 2.9 L
Post FEV1/FVC ratio: 82 %
Post FEV6/FVC ratio: 100 %
Pre FEV1/FVC ratio: 82 %
Pre FEV6/FVC Ratio: 100 %
RV % pred: 83 %
RV: 1.87 L
TLC % pred: 79 %
TLC: 4.96 L

## 2020-11-20 NOTE — Progress Notes (Signed)
PFT done today. 

## 2020-11-22 NOTE — Progress Notes (Signed)
Called and went over PFT results per Dr Melvyn Novas with patient. All questions answered and patient expressed full understanding. Patient stated "I'm feeling better and have been walking more." Patient aware to follow up with [place] after the 1st of the year if not satisfied that he's making progress and we will order the CPST. Nothing further needed at this time.

## 2020-12-03 ENCOUNTER — Ambulatory Visit: Payer: Medicare Other | Admitting: Rheumatology

## 2020-12-14 ENCOUNTER — Other Ambulatory Visit: Payer: Self-pay | Admitting: Podiatry

## 2020-12-14 DIAGNOSIS — M778 Other enthesopathies, not elsewhere classified: Secondary | ICD-10-CM

## 2020-12-25 DIAGNOSIS — Z860100 Personal history of colon polyps, unspecified: Secondary | ICD-10-CM | POA: Insufficient documentation

## 2020-12-25 NOTE — Progress Notes (Signed)
Office Visit Note  Patient: David Weaver             Date of Birth: [birth year]           MRN: [id number]             PCP: Wardell Honour, MD Referring: Wardell Honour, MD Visit Date: 01/08/2021 Occupation: @GUAROCC @  Subjective:  Medication management.   History of Present Illness: David Weaver is a 70 y.o. male with history of systemic lupus dermatosis, osteoarthritis, fibromyalgia syndrome.  He denies any joint swelling.  He states he has been having some discomfort in his left foot for which he has been seeing a podiatrist.  He had a cortisone injection in his left foot.  He denies any history of oral ulcers or nasal ulcers.  He continues to have some sicca symptoms.  He denies Raynaud's phenomenon, photosensitivity, lymphadenopathy or malar rash.  Activities of Daily Living:  Patient reports morning stiffness for 20-30 minutes.   Patient Denies nocturnal pain.  Difficulty dressing/grooming: Denies Difficulty climbing stairs: Denies Difficulty getting out of chair: Denies Difficulty using hands for taps, buttons, cutlery, and/or writing: Denies  Review of Systems  Constitutional: Negative for fatigue and night sweats.  HENT: Positive for mouth dryness and nose dryness. Negative for mouth sores.   Eyes: Positive for dryness. Negative for pain, redness and itching.  Respiratory: Negative for shortness of breath and difficulty breathing.   Cardiovascular: Negative for chest pain, palpitations, hypertension, irregular heartbeat and swelling in legs/feet.  Gastrointestinal: Negative for blood in stool, constipation and diarrhea.  Endocrine: Negative for increased urination.  Genitourinary: Negative for difficulty urinating.  Musculoskeletal: Positive for morning stiffness. Negative for arthralgias, joint pain, joint swelling, myalgias, muscle weakness, muscle tenderness and myalgias.  Skin: Negative for color change, rash, hair loss, nodules/bumps, redness, skin tightness,  ulcers and sensitivity to sunlight.  Allergic/Immunologic: Negative for susceptible to infections.  Neurological: Negative for dizziness, fainting, numbness, headaches, memory loss, night sweats and weakness.  Hematological: Positive for bruising/bleeding tendency. Negative for swollen glands.  Psychiatric/Behavioral: Negative for depressed mood, confusion and sleep disturbance. The patient is not nervous/anxious.     PMFS History:  Patient Active Problem List   Diagnosis Date Noted  . DOE (dyspnea on exertion) 10/11/2020  . COPD GOLD 0  10/11/2020  . S/P coronary artery stent placement 07/20/2020  . Hypertriglyceridemia 07/20/2020  . Pure hypercholesterolemia 07/20/2020  . Nausea without vomiting 08/04/2018  . Excessive sweating 08/04/2018  . Systemic involvement of connective tissue ([place]) 06/06/2018  . Fibromyalgia 04/13/2018  . History of total knee replacement, bilateral 04/13/2018  . DDD (degenerative disc disease), lumbar 04/13/2018  . High risk medication use 03/06/2017  . Paroxysmal atrial fibrillation ([place]) 02/19/2016  . Chronic pain syndrome 02/12/2015  . Primary osteoarthritis of both hands   . Esophageal reflux   . Autoimmune disease ([place])   . Hearing loss   . Essential hypertension, benign 11/01/2012  . Other abnormal glucose 11/01/2012  . Depression 11/01/2012  . Insomnia 11/01/2012    Past Medical History:  Diagnosis Date  . Alcohol abuse, daily use   . Allergic rhinitis, cause unspecified   . Angina pectoris ([place]) 03/26/2020  . Atypical mole 10/19/2019   Right Cheek (widershave)  . Dysthymic disorder   . Esophageal reflux   . Essential hypertension, benign   . Fibromyalgia    followed by [name] every six months.  . Fibromyositis   . Hypertriglyceridemia 07/20/2020  . Impotence  of organic origin   . Insomnia, unspecified   . Left knee DJD   . Lupus erythematosus   . Osteoarthrosis, unspecified whether generalized or localized, lower leg   . Other  abnormal glucose   . Other specified disorder of male genital organs(608.89)   . Pure hypercholesterolemia 07/20/2020  . S/P coronary artery stent placement 07/20/2020   LAD PCI 03/2020  . Systemic lupus erythematosus ([place])   . Unspecified hearing loss   . Unspecified vitamin D deficiency     Family History  Problem Relation Age of Onset  . Alzheimer's disease Mother   . Osteoporosis Mother   . Heart disease Father 75       AMI  . Alcohol abuse Father   . Cancer Brother        colon cancer  . Diabetes Brother   . Kidney failure Brother    Past Surgical History:  Procedure Laterality Date  . CARPAL TUNNEL RELEASE     bilateral  . CHOLECYSTECTOMY    . COLONOSCOPY  03/01/2014   two polyps; Dr. Benson Norway.  Repeat 5 years.  . CORONARY STENT INTERVENTION N/A 04/06/2020   Procedure: CORONARY STENT INTERVENTION;  Surgeon: Wellington Hampshire, MD;  Location: [facility];  Service: Cardiovascular;  Laterality: N/A;  . GANGLION CYST EXCISION  1980   Left wrist  . JOINT REPLACEMENT  01/2013   right knee, left knee. [facility].  [name] KNEE ARTHROPLASTY    . KNEE ARTHROSCOPY  2008   Right  . LEFT HEART CATH AND CORONARY ANGIOGRAPHY N/A 04/06/2020   Procedure: LEFT HEART CATH AND CORONARY ANGIOGRAPHY;  Surgeon: Wellington Hampshire, MD;  Location: [facility];  Service: Cardiovascular;  Laterality: N/A;  . lumbar spine epidural  2009   [facility]  . ROTATOR CUFF REPAIR  10/2008   right  . TOTAL KNEE ARTHROPLASTY  12/27/2012   Procedure: TOTAL KNEE ARTHROPLASTY;  Surgeon: [name], MD;  Location: [facility];  Service: Orthopedics;  Laterality: Right;  RIGHT ARTHROPLASTY KNEE MEDIAL AND LATERAL COMPARTMENTS WITH PATELLA RESURFACING, RIGHT TOTAL KNEE REPLACEMENT  . TOTAL KNEE ARTHROPLASTY Left 01/31/2013   Procedure: TOTAL KNEE ARTHROPLASTY;  Surgeon: [name], MD;  Location: [facility];  Service: Orthopedics;  Laterality: Left;  . TOTAL SHOULDER ARTHROPLASTY     Social History   Social  History Narrative   Guns in the home stored in locked cabinet.       Caffeine use: 1 serving/ day.     Marital status:  Married x 25 years, third marriage, happily married.      Children: one son (42); one step-daughter; two grandsons      Lives: with wife. Mother-in-law going to move in in 2017.      Employment:  Works at [employer] on [employer]; [employer] work; happy; 25 hours per week.      Tobacco: none currently; quit 2013.  Smoked x 40 years.      Alcohol:  2-6 beers per night in 2018; much heavier use in fifties.      Drugs: none      Exercise:  None in 2017.      Seatbelt: 100%      ADLs: independent with ADLs in 2017; no assistant devices      Advanced Directives: none in 2017; FULL CODE in 2017; no prolonged measures   Immunization History  Administered Date(s) Administered  . Influenza, High Dose Seasonal PF 09/17/2020  . Influenza,  Seasonal, Injecte, Preservative Fre 11/01/2012  . Influenza,inj,Quad PF,6+ Mos 11/21/2013, 10/11/2014, 10/03/2015, 09/02/2016, 09/07/2017  . Influenza-Unspecified 08/20/2009, 08/19/2010, 08/12/2011, 11/21/2013, 10/11/2014, 10/03/2015, 09/02/2016, 09/07/2017  . PFIZER([place])SARS-COV-2 Vaccination 01/06/2020, 01/27/2020, 07/25/2020  . Pneumococcal Conjugate-13 02/12/2015  . Pneumococcal Polysaccharide-23 02/19/2016  . Pneumococcal-Unspecified 03/03/2008  . Td 03/22/2001, 07/01/2001  . Tdap 02/03/2012  . Zoster 08/12/2011  . Zoster Recombinat (Shingrix) 11/18/2018     Objective: Vital Signs: BP 134/74 (BP Location: Right Arm, Patient Position: Sitting, Cuff Size: Normal)   Pulse 79   Resp 15   Ht 5\' 7"  (1.702 m)   Wt 195 lb (88.5 kg)   BMI 30.54 kg/m    Physical Exam Vitals and nursing note reviewed.  Constitutional:      Appearance: He is well-developed and well-nourished.  HENT:     Head: Normocephalic and atraumatic.  Eyes:     Extraocular Movements: EOM normal.     Conjunctiva/sclera: Conjunctivae normal.     Pupils: Pupils are  equal, round, and reactive to light.  Cardiovascular:     Rate and Rhythm: Normal rate and regular rhythm.     Heart sounds: Normal heart sounds.  Pulmonary:     Effort: Pulmonary effort is normal.     Breath sounds: Normal breath sounds.  Abdominal:     General: Bowel sounds are normal.     Palpations: Abdomen is soft.  Musculoskeletal:     Cervical back: Normal range of motion and neck supple.  Skin:    General: Skin is warm and dry.     Capillary Refill: Capillary refill takes less than 2 seconds.  Neurological:     Mental Status: He is alert and oriented to person, place, and time.  Psychiatric:        Mood and Affect: Mood and affect normal.        Behavior: Behavior normal.      Musculoskeletal Exam: C-spine was in good range of motion.  Shoulders and elbow joints with good range of motion.  He had no synovitis of wrist joints or MCPs.  He has bilateral PIP and DIP thickening.  Hip joints and knee joints with good range of motion.  He had bilateral  midfoot arthritis.  CDAI Exam: CDAI Score: - Patient Global: -; Provider Global: - Swollen: -; Tender: - Joint Exam 01/08/2021   No joint exam has been documented for this visit   There is currently no information documented on the homunculus. Go to the Rheumatology activity and complete the homunculus joint exam.  Investigation: No additional findings.  Imaging: No results found.  Recent Labs: Lab Results  Component Value Date   WBC 7.6 07/02/2020   HGB 14.9 07/02/2020   PLT 240 07/02/2020   NA 141 07/30/2020   K 4.3 07/30/2020   CL 102 07/30/2020   CO2 24 07/30/2020   GLUCOSE 111 (H) 07/30/2020   BUN 12 07/30/2020   CREATININE 0.53 (L) 07/30/2020   BILITOT 0.5 07/30/2020   ALKPHOS 109 07/30/2020   AST 56 (H) 07/30/2020   ALT 69 (H) 07/30/2020   PROT 6.4 07/30/2020   ALBUMIN 4.2 07/30/2020   CALCIUM 8.8 07/30/2020   GFRAA 123 07/30/2020    Speciality Comments: PLQ Eye Exam 05/10/2020 WNL @ [facility] follow in 1 year 06/11/2020 WNL @ [name]  Plaquenil started in 2009.  Procedures:  No procedures performed Allergies: Patient has no known allergies.   Assessment / Plan:  Visit Diagnoses: Other organ or system involvement in systemic lupus erythematosus ([name]) - +ANA, +Ro, +La. history of DLE and sicca  -he continues to have sicca symptoms.  He denies having any new rash.  Has been tolerating Plaquenil well..  Plan: Urinalysis, Routine w reflex microscopic, Anti-DNA antibody, double-stranded, C3 and C4, Sedimentation rate  High risk medication use - Plaquenil 200 mg p.o. twice daily. Plaquenil was a started in 2009.  Increased risk of ocular toxicity after 20 years of Plaquenil use was discussed.  He has been advised to get eye examination closely.  He states that he gets eye examination at here locally and also at the [place]: CBC with Differential/Platelet, COMPLETE METABOLIC PANEL WITH GFR today and every 5 months.  Primary osteoarthritis of both hands-he has arthritis in bilateral hands which causes discomfort.  History of total knee replacement, bilateral-he continues to have chronic discomfort in his knee joints.  Pain in left foot-he has been experiencing left midfoot pain.  He has been seeing a podiatrist.  Have advised him to get proper orthotics which may relieve pressure on the medial aspect of his foot.  DDD (degenerative disc disease), lumbar-chronic pain.  Fibromyalgia-managed by his PCP.  History of hypertension-his blood pressure was normal today.  Paroxysmal atrial fibrillation ([name])-he is on chronic anticoagulant therapy.  S/P coronary artery stent placement  History of depression  History of gastroesophageal reflux (GERD)  Educated about COVID-19 virus infection-has been fully vaccinated against COVID-19 and also received a third dose.  He has been advised to get a booster (fourth dose) 6 months after  the third dose.  Use of mask, social distancing and hand hygiene was discussed.  BMI 30.54 dietary modifications were discussed.  A handout was placed in the AVS.  Orders: Orders Placed This Encounter  Procedures  . CBC with Differential/Platelet  . COMPLETE METABOLIC PANEL WITH GFR  . Urinalysis, Routine w reflex microscopic  . Anti-DNA antibody, double-stranded  . C3 and C4  . Sedimentation rate  . CBC with Differential/Platelet  . COMPLETE METABOLIC PANEL WITH GFR   No orders of the defined types were placed in this encounter.  Follow-Up Instructions: Return in about 5 months (around 06/07/2021) for Systemic lupus.   Bo Merino, MD  Note - This record has been created using Editor, commissioning.  Chart creation errors have been sought, but may not always  have been located. Such creation errors do not reflect on  the standard of medical care.

## 2021-01-05 ENCOUNTER — Other Ambulatory Visit: Payer: Self-pay | Admitting: Rheumatology

## 2021-01-05 DIAGNOSIS — M359 Systemic involvement of connective tissue, unspecified: Secondary | ICD-10-CM

## 2021-01-06 DIAGNOSIS — I700 Atherosclerosis of aorta: Secondary | ICD-10-CM | POA: Insufficient documentation

## 2021-01-06 HISTORY — DX: Atherosclerosis of aorta: I70.0

## 2021-01-07 NOTE — Telephone Encounter (Signed)
Last Visit: 07/02/2020 Next Visit: 01/08/2021 Labs: CMP 07/30/2020, Glucose 111, Creatinine 0.53, AST 56, ALT 69, CBC 07/02/2020, Labs are stable. Complements are slightly decreased. We will continue to monitor.  No change in treatment advised. Labs ordered for visit 01/07/2021 Eye exam: 06/11/2020  Current Dose per office note 07/02/2020, Plaquenil 200 mg p.o. twice daily. DX: Other organ or system involvement in systemic lupus erythematosus   Okay to refill Plaquenil?

## 2021-01-08 ENCOUNTER — Encounter: Payer: Self-pay | Admitting: Rheumatology

## 2021-01-08 ENCOUNTER — Ambulatory Visit (INDEPENDENT_AMBULATORY_CARE_PROVIDER_SITE_OTHER): Payer: Medicare Other | Admitting: Rheumatology

## 2021-01-08 ENCOUNTER — Other Ambulatory Visit: Payer: Self-pay

## 2021-01-08 VITALS — BP 134/74 | HR 79 | Resp 15 | Ht 67.0 in | Wt 195.0 lb

## 2021-01-08 DIAGNOSIS — Z955 Presence of coronary angioplasty implant and graft: Secondary | ICD-10-CM | POA: Diagnosis not present

## 2021-01-08 DIAGNOSIS — Z8659 Personal history of other mental and behavioral disorders: Secondary | ICD-10-CM

## 2021-01-08 DIAGNOSIS — Z6830 Body mass index (BMI) 30.0-30.9, adult: Secondary | ICD-10-CM

## 2021-01-08 DIAGNOSIS — Z96653 Presence of artificial knee joint, bilateral: Secondary | ICD-10-CM

## 2021-01-08 DIAGNOSIS — Z7189 Other specified counseling: Secondary | ICD-10-CM

## 2021-01-08 DIAGNOSIS — I480 Paroxysmal atrial fibrillation: Secondary | ICD-10-CM

## 2021-01-08 DIAGNOSIS — M19042 Primary osteoarthritis, left hand: Secondary | ICD-10-CM

## 2021-01-08 DIAGNOSIS — Z8679 Personal history of other diseases of the circulatory system: Secondary | ICD-10-CM

## 2021-01-08 DIAGNOSIS — M797 Fibromyalgia: Secondary | ICD-10-CM

## 2021-01-08 DIAGNOSIS — Z79899 Other long term (current) drug therapy: Secondary | ICD-10-CM | POA: Diagnosis not present

## 2021-01-08 DIAGNOSIS — Z8719 Personal history of other diseases of the digestive system: Secondary | ICD-10-CM

## 2021-01-08 DIAGNOSIS — M79672 Pain in left foot: Secondary | ICD-10-CM

## 2021-01-08 DIAGNOSIS — M19041 Primary osteoarthritis, right hand: Secondary | ICD-10-CM

## 2021-01-08 DIAGNOSIS — M5136 Other intervertebral disc degeneration, lumbar region: Secondary | ICD-10-CM

## 2021-01-08 DIAGNOSIS — M3219 Other organ or system involvement in systemic lupus erythematosus: Secondary | ICD-10-CM

## 2021-01-08 NOTE — Patient Instructions (Addendum)
COVID-19 vaccine recommendations:   COVID-19 vaccine is recommended for everyone (unless you are allergic to a vaccine component), even if you are on a medication that suppresses your immune system.   Immunocompromised individuals are advised to get 3 COVID 19 vaccine doses 1 month apart and then a fourth dose (booster) 6 months later.  Do not take Tylenol or any anti-inflammatory medications (NSAIDs) 24 hours prior to the COVID-19 vaccination.   There is no direct evidence about the efficacy of the COVID-19 vaccine in individuals who are on medications that suppress the immune system.   Even if you are fully vaccinated, and you are on any medications that suppress your immune system, please continue to wear a mask, maintain at least six feet social distance and practice hand hygiene.   If you develop a COVID-19 infection, please contact your PCP or our office to determine if you need monoclonal antibody infusion.  The booster vaccine is now available for immunocompromised patients.   Please see the following web sites for updated information.   [email or web address]://[email or web address]/[email or web address]/[email or web address]-Vaccination-Patient-Resources.pdf  Heart Disease Prevention   Your inflammatory disease increases your risk of heart disease which includes heart attack, stroke, atrial fibrillation (irregular heartbeats), high blood pressure, heart failure and atherosclerosis (plaque in the arteries).  It is important to reduce your risk by:   . Keep blood pressure, cholesterol, and blood sugar at healthy levels   . Smoking Cessation   . Maintain a healthy weight  o BMI 20-25   . Eat a healthy diet  o Plenty of fresh fruit, vegetables, and whole grains  o Limit saturated fats, foods high in sodium, and added sugars  o DASH and Mediterranean diet   . Increase physical activity  o Recommend moderate physically activity for 150 minutes per week/ 30 minutes a day for five days a week These can be broken  up into three separate ten-minute sessions during the day.   . Reduce Stress  . Meditation, slow breathing exercises, yoga, coloring books  . Dental visits twice a year

## 2021-01-09 LAB — COMPLETE METABOLIC PANEL WITH GFR
AG Ratio: 2.1 (calc) (ref 1.0–2.5)
ALT: 21 U/L (ref 9–46)
AST: 16 U/L (ref 10–35)
Albumin: 4.1 g/dL (ref 3.6–5.1)
Alkaline phosphatase (APISO): 64 U/L (ref 35–144)
BUN/Creatinine Ratio: 23 (calc) — ABNORMAL HIGH (ref 6–22)
BUN: 14 mg/dL (ref 7–25)
CO2: 28 mmol/L (ref 20–32)
Calcium: 9.3 mg/dL (ref 8.6–10.3)
Chloride: 104 mmol/L (ref 98–110)
Creat: 0.62 mg/dL — ABNORMAL LOW (ref 0.70–1.18)
GFR, Est African American: 116 mL/min/{1.73_m2} (ref >=60)
GFR, Est Non African American: 100 mL/min/{1.73_m2} (ref >=60)
Globulin: 2 g/dL (calc) (ref 1.9–3.7)
Glucose, Bld: 175 mg/dL — ABNORMAL HIGH (ref 65–99)
Potassium: 4.3 mmol/L (ref 3.5–5.3)
Sodium: 141 mmol/L (ref 135–146)
Total Bilirubin: 0.5 mg/dL (ref 0.2–1.2)
Total Protein: 6.1 g/dL (ref 6.1–8.1)

## 2021-01-09 LAB — C3 AND C4
C3 Complement: 116 mg/dL (ref 82–185)
C4 Complement: 16 mg/dL (ref 15–53)

## 2021-01-09 LAB — CBC WITH DIFFERENTIAL/PLATELET
Absolute Monocytes: 620 cells/uL (ref 200–950)
Basophils Absolute: 37 cells/uL (ref 0–200)
Basophils Relative: 0.6 %
Eosinophils Absolute: 62 cells/uL (ref 15–500)
Eosinophils Relative: 1 %
HCT: 46.2 % (ref 38.5–50.0)
Hemoglobin: 15.5 g/dL (ref 13.2–17.1)
Lymphs Abs: 880 cells/uL (ref 850–3900)
MCH: 29.1 pg (ref 27.0–33.0)
MCHC: 33.5 g/dL (ref 32.0–36.0)
MCV: 86.8 fL (ref 80.0–100.0)
MPV: 10.7 fL (ref 7.5–12.5)
Monocytes Relative: 10 %
Neutro Abs: 4600 cells/uL (ref 1500–7800)
Neutrophils Relative %: 74.2 %
Platelets: 277 10*3/uL (ref 140–400)
RBC: 5.32 10*6/uL (ref 4.20–5.80)
RDW: 13.8 % (ref 11.0–15.0)
Total Lymphocyte: 14.2 %
WBC: 6.2 10*3/uL (ref 3.8–10.8)

## 2021-01-09 LAB — SEDIMENTATION RATE: Sed Rate: 2 mm/h (ref 0–20)

## 2021-01-09 LAB — URINALYSIS, ROUTINE W REFLEX MICROSCOPIC
Bacteria, UA: NONE SEEN /HPF
Bilirubin Urine: NEGATIVE
Glucose, UA: NEGATIVE
Hgb urine dipstick: NEGATIVE
Hyaline Cast: NONE SEEN /LPF
Ketones, ur: NEGATIVE
Leukocytes,Ua: NEGATIVE
Nitrite: NEGATIVE
RBC / HPF: NONE SEEN /HPF (ref 0–2)
Specific Gravity, Urine: 1.023 (ref 1.001–1.03)
Squamous Epithelial / HPF: NONE SEEN /HPF (ref <=5)
WBC, UA: NONE SEEN /HPF (ref 0–5)
pH: 6.5 (ref 5.0–8.0)

## 2021-01-09 LAB — ANTI-DNA ANTIBODY, DOUBLE-STRANDED: ds DNA Ab: 4 IU/mL

## 2021-01-09 NOTE — Progress Notes (Signed)
Trace protein noted in the urine, glucose is elevated, CBC is normal, complements are normal.  Sed rate is normal.  Please notify patient and forward labs to his PCP.

## 2021-02-06 ENCOUNTER — Ambulatory Visit: Payer: Medicare Other

## 2021-02-06 ENCOUNTER — Other Ambulatory Visit: Payer: Self-pay

## 2021-02-06 ENCOUNTER — Encounter: Payer: Self-pay | Admitting: Podiatry

## 2021-02-06 ENCOUNTER — Ambulatory Visit (INDEPENDENT_AMBULATORY_CARE_PROVIDER_SITE_OTHER): Payer: Medicare Other | Admitting: Podiatry

## 2021-02-06 DIAGNOSIS — M778 Other enthesopathies, not elsewhere classified: Secondary | ICD-10-CM | POA: Diagnosis not present

## 2021-02-06 DIAGNOSIS — M779 Enthesopathy, unspecified: Secondary | ICD-10-CM

## 2021-02-06 MED ORDER — TRIAMCINOLONE ACETONIDE 10 MG/ML IJ SUSP
10.0000 mg | Freq: Once | INTRAMUSCULAR | Status: AC
  Administered 2021-02-06: 10 mg

## 2021-02-06 NOTE — Progress Notes (Signed)
Subjective:   Patient ID: David Weaver, male   DOB: 70 y.o.   MRN: [id number]   HPI Patient presents stating the symptoms seem to move around it seems to be more on the inside of my foot now and I am still having pain and concerned about the long-term   ROS      Objective:  Physical Exam  Neurovascular status was found to be intact with patient's left medial first MPJ cuneiform joint and next center tendon found to be inflamed with the lateral side doing better     Assessment:  Satisfied that this is in a different position we may be able to work with it conservatively and if not will need to consider more surgical intervention but does appear to be continued tendinitis     Plan:  H&P sterile prep and injected the medial side 3 mg Kenalog 5 mg Xylocaine around the extensor tendon complex anterior to and will see back again in.  The time when it hurts and will decide on MRI at that time

## 2021-02-13 ENCOUNTER — Telehealth: Payer: Self-pay | Admitting: Podiatry

## 2021-02-13 NOTE — Telephone Encounter (Signed)
Patients wife calling on behalf of patient to inform Regal that the injection given to patient last week has not helped. Would like to know if patient needs an MRI. If so, does he need to be seen prior to imaging? Please advise.

## 2021-02-14 ENCOUNTER — Telehealth: Payer: Self-pay | Admitting: Podiatry

## 2021-02-14 ENCOUNTER — Other Ambulatory Visit: Payer: Self-pay | Admitting: Podiatry

## 2021-02-14 DIAGNOSIS — M778 Other enthesopathies, not elsewhere classified: Secondary | ICD-10-CM

## 2021-02-14 DIAGNOSIS — M069 Rheumatoid arthritis, unspecified: Secondary | ICD-10-CM

## 2021-02-14 NOTE — Telephone Encounter (Signed)
I ordered mri for him

## 2021-02-14 NOTE — Telephone Encounter (Signed)
error 

## 2021-02-14 NOTE — Telephone Encounter (Signed)
Patient expressed some concerns regarding pain, stated he wasn't sure if he should be scheduled for follow up appointment or MRI and also requested return call, Please Advise

## 2021-02-15 NOTE — Telephone Encounter (Signed)
Scheduled mri

## 2021-02-19 ENCOUNTER — Ambulatory Visit
Admission: RE | Admit: 2021-02-19 | Discharge: 2021-02-19 | Disposition: A | Discharge status: Home / Self Care | Payer: Medicare Other | Source: Ambulatory Visit | Attending: Podiatry | Admitting: Podiatry

## 2021-02-19 DIAGNOSIS — M778 Other enthesopathies, not elsewhere classified: Secondary | ICD-10-CM

## 2021-02-19 DIAGNOSIS — M069 Rheumatoid arthritis, unspecified: Secondary | ICD-10-CM

## 2021-02-19 NOTE — Telephone Encounter (Signed)
Spoke with patient, will call back to schedule appointment after MRI

## 2021-03-06 ENCOUNTER — Ambulatory Visit (INDEPENDENT_AMBULATORY_CARE_PROVIDER_SITE_OTHER): Payer: Medicare Other | Admitting: Podiatry

## 2021-03-06 ENCOUNTER — Other Ambulatory Visit: Payer: Self-pay

## 2021-03-06 ENCOUNTER — Encounter: Payer: Self-pay | Admitting: Podiatry

## 2021-03-06 DIAGNOSIS — M778 Other enthesopathies, not elsewhere classified: Secondary | ICD-10-CM | POA: Diagnosis not present

## 2021-03-06 DIAGNOSIS — R52 Pain, unspecified: Secondary | ICD-10-CM

## 2021-03-07 NOTE — Progress Notes (Signed)
Subjective:   Patient ID: David Weaver, male   DOB: 70 y.o.   MRN: [id number]   HPI Patient states his foot is bothering him quite a bit and it seems to be getting worse on the inside at this time.  He is here to discuss MRI   ROS      Objective:  Physical Exam  Neurovascular status found to be intact with exquisite discomfort around the first metatarsal cuneiform base and dorsal into the second metatarsocuneiform third metatarsal cuneiform base.  The whole area is moderately swollen and painful when palpated and seems to have gotten worse over the last few weeks     Assessment:  Probability that there may be a subtle Lisfranc dislocation or possible tear of the Lisfranc's ligament that is creating the symptoms     Plan:  H&P reviewed his previous treatments and whether or not any injury occurred but he does not remember currently.  At this point we will get a continue with conservative care consisting of ice oral anti-inflammatories topical medicine and I did dispense air fracture walker to completely immobilize and try to see if rest and reduced stress will help the condition.  Ultimately this may require a fusion or other procedure but we will give it a chance first to see how he responds conservatively.  Reappoint 4 weeks and again educated him on the results of MRI and the treatments we are undergoing

## 2021-04-01 ENCOUNTER — Ambulatory Visit (HOSPITAL_COMMUNITY)
Admission: RE | Admit: 2021-04-01 | Discharge: 2021-04-01 | Disposition: A | Discharge status: Home / Self Care | Payer: Medicare Other | Source: Ambulatory Visit | Attending: Cardiology | Admitting: Cardiology

## 2021-04-01 ENCOUNTER — Other Ambulatory Visit: Payer: Self-pay

## 2021-04-01 DIAGNOSIS — Z87891 Personal history of nicotine dependence: Secondary | ICD-10-CM

## 2021-04-01 DIAGNOSIS — I6523 Occlusion and stenosis of bilateral carotid arteries: Secondary | ICD-10-CM

## 2021-04-01 DIAGNOSIS — I10 Essential (primary) hypertension: Secondary | ICD-10-CM | POA: Diagnosis present

## 2021-04-03 ENCOUNTER — Ambulatory Visit (INDEPENDENT_AMBULATORY_CARE_PROVIDER_SITE_OTHER): Payer: Medicare Other | Admitting: Podiatry

## 2021-04-03 ENCOUNTER — Encounter: Payer: Self-pay | Admitting: Podiatry

## 2021-04-03 ENCOUNTER — Ambulatory Visit (INDEPENDENT_AMBULATORY_CARE_PROVIDER_SITE_OTHER): Payer: Medicare Other

## 2021-04-03 ENCOUNTER — Other Ambulatory Visit: Payer: Self-pay

## 2021-04-03 DIAGNOSIS — M779 Enthesopathy, unspecified: Secondary | ICD-10-CM | POA: Diagnosis not present

## 2021-04-03 DIAGNOSIS — M069 Rheumatoid arthritis, unspecified: Secondary | ICD-10-CM | POA: Diagnosis not present

## 2021-04-03 DIAGNOSIS — M778 Other enthesopathies, not elsewhere classified: Secondary | ICD-10-CM | POA: Diagnosis not present

## 2021-04-03 NOTE — Progress Notes (Signed)
Subjective:   Patient ID: David Weaver, male   DOB: 70 y.o.   MRN: [id number]   HPI Patient states I am improving some but I am still having quite a bit of pain if I am not my boot but the boot seems to be helping neuro   ROS      Objective:  Physical Exam  Vascular status intact with patient's left medial first metatarsocuneiform and dorsal into the second metatarsal cuneiform still mildly to moderately inflamed and painful      Assessment:  Continued possibility of a Lisfranc injury or inflammation process     Plan:  H&P rex-ray discussed a great deal of time going over Lisfranc's injuries and the possibility that long-term this may require surgery.  We are going to continue immobilization for several more weeks to try to get him off the immobilizer and then if he is still having significant problems we will need to go ahead and get MRI of this.  Ice and topical anti-inflammatories along with supportive shoes discussed and will be utilized  X-rays indicate there is suspicion of injury around the second metatarsal cuneiform with possibility for Lisfranc's subtle dislocation but patient had no specific acute injury so this may be an old injury

## 2021-05-01 ENCOUNTER — Ambulatory Visit (INDEPENDENT_AMBULATORY_CARE_PROVIDER_SITE_OTHER): Payer: Medicare Other

## 2021-05-01 ENCOUNTER — Ambulatory Visit (INDEPENDENT_AMBULATORY_CARE_PROVIDER_SITE_OTHER): Payer: Medicare Other | Admitting: Podiatry

## 2021-05-01 ENCOUNTER — Encounter: Payer: Self-pay | Admitting: Podiatry

## 2021-05-01 ENCOUNTER — Other Ambulatory Visit: Payer: Self-pay | Admitting: Podiatry

## 2021-05-01 ENCOUNTER — Other Ambulatory Visit: Payer: Self-pay

## 2021-05-01 DIAGNOSIS — M25511 Pain in right shoulder: Secondary | ICD-10-CM | POA: Insufficient documentation

## 2021-05-01 DIAGNOSIS — I119 Hypertensive heart disease without heart failure: Secondary | ICD-10-CM | POA: Insufficient documentation

## 2021-05-01 DIAGNOSIS — Z461 Encounter for fitting and adjustment of hearing aid: Secondary | ICD-10-CM | POA: Insufficient documentation

## 2021-05-01 DIAGNOSIS — H40003 Preglaucoma, unspecified, bilateral: Secondary | ICD-10-CM | POA: Insufficient documentation

## 2021-05-01 DIAGNOSIS — M778 Other enthesopathies, not elsewhere classified: Secondary | ICD-10-CM

## 2021-05-01 DIAGNOSIS — R1013 Epigastric pain: Secondary | ICD-10-CM | POA: Insufficient documentation

## 2021-05-01 DIAGNOSIS — Z79899 Other long term (current) drug therapy: Secondary | ICD-10-CM | POA: Insufficient documentation

## 2021-05-01 DIAGNOSIS — M069 Rheumatoid arthritis, unspecified: Secondary | ICD-10-CM

## 2021-05-01 DIAGNOSIS — R1031 Right lower quadrant pain: Secondary | ICD-10-CM | POA: Insufficient documentation

## 2021-05-01 DIAGNOSIS — R7401 Elevation of levels of liver transaminase levels: Secondary | ICD-10-CM | POA: Insufficient documentation

## 2021-05-01 DIAGNOSIS — H903 Sensorineural hearing loss, bilateral: Secondary | ICD-10-CM | POA: Insufficient documentation

## 2021-05-01 DIAGNOSIS — R142 Eructation: Secondary | ICD-10-CM | POA: Insufficient documentation

## 2021-05-01 DIAGNOSIS — Z800 Family history of malignant neoplasm of digestive organs: Secondary | ICD-10-CM | POA: Insufficient documentation

## 2021-05-01 DIAGNOSIS — H47393 Other disorders of optic disc, bilateral: Secondary | ICD-10-CM | POA: Insufficient documentation

## 2021-05-01 DIAGNOSIS — F419 Anxiety disorder, unspecified: Secondary | ICD-10-CM | POA: Insufficient documentation

## 2021-05-01 DIAGNOSIS — R141 Gas pain: Secondary | ICD-10-CM | POA: Insufficient documentation

## 2021-05-01 DIAGNOSIS — M329 Systemic lupus erythematosus, unspecified: Secondary | ICD-10-CM | POA: Insufficient documentation

## 2021-05-01 DIAGNOSIS — H47233 Glaucomatous optic atrophy, bilateral: Secondary | ICD-10-CM | POA: Insufficient documentation

## 2021-05-01 DIAGNOSIS — K625 Hemorrhage of anus and rectum: Secondary | ICD-10-CM | POA: Insufficient documentation

## 2021-05-01 DIAGNOSIS — R197 Diarrhea, unspecified: Secondary | ICD-10-CM | POA: Insufficient documentation

## 2021-05-01 DIAGNOSIS — R5383 Other fatigue: Secondary | ICD-10-CM | POA: Insufficient documentation

## 2021-05-01 NOTE — Progress Notes (Signed)
Subjective:   Patient ID: David Weaver, male   DOB: 70 y.o.   MRN: [id number]   HPI Patient presents stating he has developed collapse of the medial arch of his left foot and states that it seems the pain is improving with the boot but he feels like his foot is collapsing   ROS      Objective:  Physical Exam  Neurovascular status intact with still some swelling around the medial aspect first metatarsal cuneiform and second metatarsal cuneiform with discomfort but much improved from previous visit     Assessment:  Moderate collapse medial longitudinal arch left with possibility still for arthritis or possible Lisfranc's injury     Plan:  We are can slowly reduce the boot continue ice therapy and I casted for functional orthotics to lift up the medial arch left over right.  Patient will be seen back when returned and spent a great deal of time going over this with him and the possibility in the future for MRI at this

## 2021-05-22 NOTE — Progress Notes (Deleted)
Office Visit Note  Patient: David Weaver             Date of Birth: 10-30-1951           MRN: [id number]             PCP: Wardell Honour, MD Referring: Wardell Honour, MD Visit Date: 06/05/2021 Occupation: @GUAROCC @  Subjective:    History of Present Illness: David Weaver is a 70 y.o. male with history systemic lupus erythematosus, osteoarthritis, and DDD.  She is taking plaquenil 200 mg 1 tablet by mouth twice daily.  Activities of Daily Living:  Patient reports morning stiffness for *** {minute/hour:19697}.   Patient {ACTIONS;DENIES/REPORTS:21021675::"Denies"} nocturnal pain.  Difficulty dressing/grooming: {ACTIONS;DENIES/REPORTS:21021675::"Denies"} Difficulty climbing stairs: {ACTIONS;DENIES/REPORTS:21021675::"Denies"} Difficulty getting out of chair: {ACTIONS;DENIES/REPORTS:21021675::"Denies"} Difficulty using hands for taps, buttons, cutlery, and/or writing: {ACTIONS;DENIES/REPORTS:21021675::"Denies"}  No Rheumatology ROS completed.   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   Hypertensive heart disease without heart failure 05/01/2021   Other disorders of optic disc, 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   Other long term (current) drug therapy 05/01/2021   Pain in right shoulder 05/01/2021   Preglaucoma, unspecified, 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   Rectal bleeding 05/01/2021   Right lower quadrant pain 05/01/2021   Sensorineural hearing loss, 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   Systemic lupus erythematosus ([place]) 05/01/2021   Hardening of the aorta (main artery  of the heart) ([place]) 01/06/2021   Personal history of colonic polyps 12/25/2020   DOE (dyspnea on exertion) 10/11/2020   COPD GOLD 0  10/11/2020   S/P coronary artery stent placement 07/20/2020   Hypertriglyceridemia 07/20/2020   Pure hypercholesterolemia 07/20/2020   SLE (systemic lupus erythematosus related syndrome) ([place]) 08/15/2019   Nausea without vomiting 08/04/2018   Excessive sweating 08/04/2018   Systemic involvement of connective tissue ([place]) 06/06/2018   Fibromyalgia 04/13/2018   History of total knee replacement, bilateral 04/13/2018   DDD (degenerative disc disease), lumbar 04/13/2018   High risk medication use 03/06/2017   Paroxysmal atrial fibrillation ([place]) 02/19/2016   Chronic pain syndrome 02/12/2015   Primary osteoarthritis of both hands    Esophageal reflux    Autoimmune disease ([place])    Hearing loss    Essential hypertension, benign 11/01/2012   Other abnormal glucose 11/01/2012   Depression 11/01/2012   Insomnia 11/01/2012    Past Medical History:  Diagnosis Date   Alcohol abuse, daily use    Allergic rhinitis, cause unspecified    Angina pectoris ([place]) 03/26/2020   Atypical mole 10/19/2019   Right Cheek ([place])   Dysthymic disorder    Esophageal reflux    Essential hypertension, benign    Fibromyalgia    followed by [name] every six months.   Fibromyositis    Hypertriglyceridemia 07/20/2020   Impotence of organic origin    Insomnia, unspecified    Left knee DJD    Lupus erythematosus    Osteoarthrosis, unspecified whether generalized or localized, lower leg    Other abnormal glucose    Other specified disorder of male genital organs(608.89)    Pure hypercholesterolemia 07/20/2020   S/P coronary artery stent  placement 07/20/2020   LAD PCI 03/2020   Systemic lupus erythematosus ([place])    Unspecified hearing loss    Unspecified vitamin D deficiency     Family History  Problem Relation Age of Onset   Alzheimer's disease Mother     Osteoporosis Mother    Heart disease Father 78       AMI   Alcohol abuse Father    Cancer Brother        colon cancer   Diabetes Brother    Kidney failure Brother    Past Surgical History:  Procedure Laterality Date   CARPAL TUNNEL RELEASE     bilateral   CHOLECYSTECTOMY     COLONOSCOPY  03/01/2014   two polyps; Dr. Benson [place].  Repeat 5 years.   CORONARY STENT INTERVENTION N/A 04/06/2020   Procedure: CORONARY STENT INTERVENTION;  Surgeon: Wellington Hampshire, MD;  Location: [facility];  Service: Cardiovascular;  Laterality: N/A;   GANGLION CYST EXCISION  1980   Left wrist   JOINT REPLACEMENT  01/2013   right knee, left knee. [facility].   KNEE ARTHROPLASTY     KNEE ARTHROSCOPY  2008   Right   LEFT HEART CATH AND CORONARY ANGIOGRAPHY N/A 04/06/2020   Procedure: LEFT HEART CATH AND CORONARY ANGIOGRAPHY;  Surgeon: Wellington Hampshire, MD;  Location: [facility];  Service: Cardiovascular;  Laterality: N/A;   lumbar spine epidural  2009   [facility]   ROTATOR CUFF REPAIR  10/2008   right   TOTAL KNEE ARTHROPLASTY  12/27/2012   Procedure: TOTAL KNEE ARTHROPLASTY;  Surgeon: [name], MD;  Location: [facility];  Service: Orthopedics;  Laterality: Right;  RIGHT ARTHROPLASTY KNEE MEDIAL AND LATERAL COMPARTMENTS WITH PATELLA RESURFACING, RIGHT TOTAL KNEE REPLACEMENT   TOTAL KNEE ARTHROPLASTY Left 01/31/2013   Procedure: TOTAL KNEE ARTHROPLASTY;  Surgeon: [name], MD;  Location: [facility];  Service: Orthopedics;  Laterality: Left;   TOTAL SHOULDER ARTHROPLASTY     Social History   Social History Narrative   Guns in the home stored in locked cabinet.       Caffeine use: 1 serving/ day.     Marital status:  Married x 25 years, third marriage, happily married.      Children: one son (42); one step-daughter; two grandsons      Lives: with wife. Mother-in-law going to move in in 2017.      Employment:  Works at [employer] on [employer]; [employer] work; happy; 25 hours per week.       Tobacco: none currently; quit 2013.  Smoked x 40 years.      Alcohol:  2-6 beers per night in 2018; much heavier use in fifties.      Drugs: none      Exercise:  None in 2017.      Seatbelt: 100%      ADLs: independent with ADLs in 2017; no assistant devices      Advanced Directives: none in 2017; FULL CODE in 2017; no prolonged measures   Immunization History  Administered Date(s) Administered   Influenza Split 09/02/2016   Influenza, High Dose Seasonal PF 09/17/2020   Influenza, Seasonal, Injecte, Preservative Fre 11/01/2012   Influenza,inj,Quad PF,6+ Mos 11/21/2013, 10/11/2014, 10/03/2015, 09/02/2016, 09/07/2017   Influenza-Unspecified 08/20/2009, 08/19/2010, 08/12/2011, 11/21/2013, 10/11/2014, 10/03/2015, 09/02/2016, 09/07/2017, 10/22/2020   PFIZER(Purple Top)SARS-COV-2 Vaccination 01/06/2020, 01/27/2020, 07/25/2020   Pneumococcal Conjugate-13 02/12/2015, 04/01/2015   Pneumococcal Polysaccharide-23 02/19/2016   Pneumococcal-Unspecified 03/03/2008   Td  03/22/2001, 07/01/2001   Tdap 12/01/2010, 02/03/2012   Zoster Recombinat (Shingrix) 11/18/2018   Zoster, Live 08/12/2011     Objective: Vital Signs: There were no vitals taken for this visit.   Physical Exam   Musculoskeletal Exam: ***  CDAI Exam: CDAI Score: -- Patient Global: --; Provider Global: -- Swollen: --; Tender: -- Joint Exam 06/05/2021   No joint exam has been documented for this visit   There is currently no information documented on the homunculus. Go to the Rheumatology activity and complete the homunculus joint exam.  Investigation: No additional findings.  Imaging: DG Foot Complete Left  Result Date: 05/01/2021 Please see detailed radiograph report in office note.   Recent Labs: Lab Results  Component Value Date   WBC 6.2 01/08/2021   HGB 15.5 01/08/2021   PLT 277 01/08/2021   NA 141 01/08/2021   K 4.3 01/08/2021   CL 104 01/08/2021   CO2 28 01/08/2021   GLUCOSE 175 (H) 01/08/2021   BUN 14  01/08/2021   CREATININE 0.62 (L) 01/08/2021   BILITOT 0.5 01/08/2021   ALKPHOS 109 07/30/2020   AST 16 01/08/2021   ALT 21 01/08/2021   PROT 6.1 01/08/2021   ALBUMIN 4.2 07/30/2020   CALCIUM 9.3 01/08/2021   GFRAA 116 01/08/2021    Speciality Comments: PLQ Eye Exam 05/10/2020 WNL @ [facility] follow in 1 year 06/11/2020 WNL @ [facility]  Plaquenil started in 2009.  Procedures:  No procedures performed Allergies: Patient has no known allergies.   Assessment / Plan:     Visit Diagnoses: No diagnosis found.  Orders: No orders of the defined types were placed in this encounter.  No orders of the defined types were placed in this encounter.   Face-to-face time spent with patient was *** minutes. Greater than 50% of time was spent in counseling and coordination of care.  Follow-Up Instructions: No follow-ups on file.   Earnestine Mealing, CMA  Note - This record has been created using Editor, commissioning.  Chart creation errors have been sought, but may not always  have been located. Such creation errors do not reflect on  the standard of medical care.

## 2021-05-24 ENCOUNTER — Telehealth: Payer: Self-pay | Admitting: Podiatry

## 2021-05-24 NOTE — Telephone Encounter (Signed)
Orthotics in.. lvm for pt to call to schedule an appt to pick them up. °

## 2021-06-05 ENCOUNTER — Ambulatory Visit: Payer: Medicare Other | Admitting: Physician Assistant

## 2021-06-18 ENCOUNTER — Ambulatory Visit (INDEPENDENT_AMBULATORY_CARE_PROVIDER_SITE_OTHER): Payer: Medicare Other

## 2021-06-18 ENCOUNTER — Other Ambulatory Visit: Payer: Self-pay

## 2021-06-18 DIAGNOSIS — M778 Other enthesopathies, not elsewhere classified: Secondary | ICD-10-CM

## 2021-06-18 NOTE — Progress Notes (Signed)
Patient in office today to pick-up custom orthotics. Patient tried on the orthotics and was satisfied with the fit. Patient was educated on the break-in process and was advised to call the office with any questions, comments, or concerns. Patient verbalized understanding.

## 2021-06-25 ENCOUNTER — Ambulatory Visit (HOSPITAL_BASED_OUTPATIENT_CLINIC_OR_DEPARTMENT_OTHER): Payer: Medicare Other | Admitting: Cardiovascular Disease

## 2021-08-22 ENCOUNTER — Ambulatory Visit (HOSPITAL_BASED_OUTPATIENT_CLINIC_OR_DEPARTMENT_OTHER): Payer: Medicare Other | Admitting: Cardiovascular Disease

## 2021-09-10 ENCOUNTER — Ambulatory Visit: Payer: Medicare Other | Admitting: Physician Assistant

## 2021-09-12 ENCOUNTER — Ambulatory Visit (INDEPENDENT_AMBULATORY_CARE_PROVIDER_SITE_OTHER): Payer: Medicare Other | Admitting: Cardiovascular Disease

## 2021-09-12 ENCOUNTER — Other Ambulatory Visit: Payer: Self-pay

## 2021-09-12 VITALS — BP 158/86 | Ht 67.0 in | Wt 192.6 lb

## 2021-09-12 DIAGNOSIS — E7800 Pure hypercholesterolemia, unspecified: Secondary | ICD-10-CM | POA: Diagnosis not present

## 2021-09-12 DIAGNOSIS — Z955 Presence of coronary angioplasty implant and graft: Secondary | ICD-10-CM

## 2021-09-12 DIAGNOSIS — R0609 Other forms of dyspnea: Secondary | ICD-10-CM

## 2021-09-12 DIAGNOSIS — R0602 Shortness of breath: Secondary | ICD-10-CM

## 2021-09-12 DIAGNOSIS — I10 Essential (primary) hypertension: Secondary | ICD-10-CM | POA: Diagnosis not present

## 2021-09-12 DIAGNOSIS — I480 Paroxysmal atrial fibrillation: Secondary | ICD-10-CM | POA: Diagnosis not present

## 2021-09-12 DIAGNOSIS — E781 Pure hyperglyceridemia: Secondary | ICD-10-CM

## 2021-09-12 MED ORDER — LOSARTAN POTASSIUM 100 MG PO TABS: 100.0000 mg | ORAL_TABLET | Freq: Every day | ORAL | 3 refills | Status: AC

## 2021-09-12 NOTE — Patient Instructions (Signed)
Medication Instructions:  INCREASE YOUR LOSARTAN TO 100 MG DAILY   STOP CLOPIDOGREL   *If you need a refill on your cardiac medications before your next appointment, please call your pharmacy*  Lab Work: NONE  Testing/Procedures: Your physician has requested that you have an echocardiogram. Echocardiography is a painless test that uses sound waves to create images of your heart. It provides your doctor with information about the size and shape of your heart and how well your heart's chambers and valves are working. This procedure takes approximately one hour. There are no restrictions for this procedure.   Follow-Up: At [facility], you and your health needs are our priority.  As part of our continuing mission to provide you with exceptional heart care, we have created designated Provider Care Teams.  These Care Teams include your primary Cardiologist (physician) and Advanced Practice Providers (APPs -  Physician Assistants and Nurse Practitioners) who all work together to provide you with the care you need, when you need it.  We recommend signing up for the patient portal called "MyChart".  Sign up information is provided on this After Visit Summary.  MyChart is used to connect with patients for Virtual Visits (Telemedicine).  Patients are able to view lab/test results, encounter notes, upcoming appointments, etc.  Non-urgent messages can be sent to your provider as well.   To learn more about what you can do with MyChart, go to [email or web address].    Your next appointment:   6 month(s)  The format for your next appointment:   In Person  Provider:   Skeet Latch, MD  Your physician recommends that you schedule a follow-up appointment in: 1 MONTH WITH APP/PHARM D   Other Instructions  MONITOR YOUR BLOOD PRESSURE DAILY BRING READINGS AND MACHINE TO FOLLOW UP IN 1 MONTH

## 2021-09-12 NOTE — Progress Notes (Signed)
Cardiology Office Note   Date:  09/12/2021   ID:  Kofi, Murrell 09-05-1951, MRN [id number]  PCP:  Wardell Honour, MD  Cardiologist:   Skeet Latch, MD   No chief complaint on file.    History of Present Illness: HISAO DOO is a 70 y.o. male with CAD s/p LAD PCI, paroxysmal atrial fibrillation, hypertension, mild carotid stenosis, and lupus who presents for follow up.  Mr. Orlich was seen in clinic on 08/07/15 for a presurgical assessment.  At that time he was cleared for shoulder surgery.  During surgery Mr. Kisling went into atrial fibrillation with RVR.  He reportedly went back into sinus rhythm without intervention. He was started on Eliquis 5mg  daily and referred to follow up with cardiology.  He subsequently had a 30 day event monitor to determine whether he needed to continue on anticoagulation. The monitor did reveal recurrent atrial fibrillation so anticoagulation was continued.  Mr. Ellena reported exertional chest pain.  He was referred for an exercise Myoview 01/2018 that revealed LVEF 46% with global hypokinesis.  There was no ischemia.   He achieved 10.5 METS on a Bruce protocol.  He was then referred for an echo that revealed LVEF 55 to 60% with grade 1 diastolic dysfunction.  At that appointment he also reported dizziness and had carotid Dopplers that revealed 1 to 39% ICA stenosis bilaterally.  Mr. Formby reported exertional chest pain.  He was referred for a coronary CT-a that revealed significant obstruction in the mid LAD.  He had a left heart cath that revealed 30% ostial LAD and 80% mid LAD that was successfully stented.  He has been active with walking but not getting any formal exercise.    He has been feeling short of breath with walking.  He noticed it about one month ago when he walks uphill from his house to his shop.  He denies LE edema, orthopnea or PND.  He has no exertional chest pain.  He thinks he snores lightly but feels rested in the morning and his wife denies  apnea episodes.  He does not fall asleep easily during the day.  His BP at home has been running in the 150s/80s.  He fell two weeks ago.  He was in the junk yard and tripped.  There was no preceding CP, SOB or palpitations.  He had an episode of atrial fibrillation about six months ago that lasted for few minutes.   Past Medical History:  Diagnosis Date   Alcohol abuse, daily use    Allergic rhinitis, cause unspecified    Angina pectoris ([place]) 03/26/2020   Atypical mole 10/19/2019   Right Cheek Mindi Slicker)   Dysthymic disorder    Esophageal reflux    Essential hypertension, benign    Fibromyalgia    followed by [name] every six months.   Fibromyositis    Hypertriglyceridemia 07/20/2020   Impotence of organic origin    Insomnia, unspecified    Left knee DJD    Lupus erythematosus    Osteoarthrosis, unspecified whether generalized or localized, lower leg    Other abnormal glucose    Other specified disorder of male genital organs(608.89)    Pure hypercholesterolemia 07/20/2020   S/P coronary artery stent placement 07/20/2020   LAD PCI 03/2020   Systemic lupus erythematosus ([place])    Unspecified hearing loss    Unspecified vitamin D deficiency     Past Surgical History:  Procedure Laterality Date   CARPAL TUNNEL RELEASE  bilateral   CHOLECYSTECTOMY     COLONOSCOPY  03/01/2014   two polyps; Dr. Benson Norway.  Repeat 5 years.   CORONARY STENT INTERVENTION N/A 04/06/2020   Procedure: CORONARY STENT INTERVENTION;  Surgeon: Wellington Hampshire, MD;  Location: [facility];  Service: Cardiovascular;  Laterality: N/A;   GANGLION CYST EXCISION  1980   Left wrist   JOINT REPLACEMENT  01/2013   right knee, left knee. [place].   KNEE ARTHROPLASTY     KNEE ARTHROSCOPY  2008   Right   LEFT HEART CATH AND CORONARY ANGIOGRAPHY N/A 04/06/2020   Procedure: LEFT HEART CATH AND CORONARY ANGIOGRAPHY;  Surgeon: Wellington Hampshire, MD;  Location: [facility];  Service: Cardiovascular;   Laterality: N/A;   lumbar spine epidural  2009   [facility]   ROTATOR CUFF REPAIR  10/2008   right   TOTAL KNEE ARTHROPLASTY  12/27/2012   Procedure: TOTAL KNEE ARTHROPLASTY;  Surgeon: [name], MD;  Location: [facility];  Service: Orthopedics;  Laterality: Right;  RIGHT ARTHROPLASTY KNEE MEDIAL AND LATERAL COMPARTMENTS WITH PATELLA RESURFACING, RIGHT TOTAL KNEE REPLACEMENT   TOTAL KNEE ARTHROPLASTY Left 01/31/2013   Procedure: TOTAL KNEE ARTHROPLASTY;  Surgeon: [name], MD;  Location: [facility];  Service: Orthopedics;  Laterality: Left;   TOTAL SHOULDER ARTHROPLASTY       Current Outpatient Medications  Medication Sig Dispense Refill   amLODipine (NORVASC) 10 MG tablet Take 10 mg by mouth daily.     amphetamine-dextroamphetamine (ADDERALL) 20 MG tablet Take 20 mg by mouth every morning.     atorvastatin (LIPITOR) 40 MG tablet Take 1 tablet (40 mg total) by mouth daily. 90 tablet 3   chlorpheniramine (CHLOR-TRIMETON) 4 MG tablet Take 4 mg by mouth 2 (two) times daily as needed for allergies.     Cholecalciferol (DIALYVITE VITAMIN D 5000) 125 MCG (5000 UT) capsule Take 5,000 Units by mouth daily.     colestipol (COLESTID) 1 g tablet Take 2 g by mouth 2 (two) times daily.  5   DULoxetine (CYMBALTA) 60 MG capsule Take 1 capsule (60 mg total) 2 (two) times daily by mouth. 161 capsule 3   folic acid (FOLVITE) 1 MG tablet Take 1 tablet by mouth daily.     hydrocortisone 2.5 % cream Apply 1 application topically 2 (two) times daily as needed (itching/rash).     hydroxychloroquine (PLAQUENIL) 200 MG tablet TAKE 1 TABLET BY MOUTH TWICE A DAY 180 tablet 0   ketoconazole (NIZORAL) 2 % cream Apply 1 application topically 2 (two) times daily as needed (lupus flare).     metFORMIN (GLUCOPHAGE) 500 MG tablet Take by mouth.     methocarbamol (ROBAXIN) 500 MG tablet Take 500 mg by mouth 3 (three) times daily.     metoprolol tartrate (LOPRESSOR) 50 MG tablet Take 50 mg by mouth 2 (two) times  daily.     mirtazapine (REMERON) 15 MG tablet Take 15 mg by mouth at bedtime.      ondansetron (ZOFRAN) 4 MG tablet TAKE 1 TABLET BY MOUTH EVERY 8 HOURS AS NEEDED FOR NAUSEA AND VOMITING 20 tablet 0   ONETOUCH ULTRA test strip 1 each daily.     Oxycodone HCl 10 MG TABS Take 10 mg by mouth every 6 (six) hours as needed (pain).      pantoprazole (PROTONIX) 40 MG tablet TAKE 1 TABLET BY MOUTH DAILY 90 tablet 1   predniSONE (DELTASONE) 10 MG tablet Take 10 mg by  mouth daily.     pregabalin (LYRICA) 50 MG capsule Take 50 mg by mouth 2 (two) times daily.     rivaroxaban (XARELTO) 20 MG TABS tablet Take 1 tablet (20 mg total) by mouth daily with supper. 90 tablet 1   losartan (COZAAR) 100 MG tablet Take 1 tablet (100 mg total) by mouth daily. 90 tablet 3   No current facility-administered medications for this visit.    Allergies:   Patient has no known allergies.    Social History:  The patient  reports that he quit smoking about 12 years ago. His smoking use included cigarettes. He has a 60.00 pack-year smoking history. He has never used smokeless tobacco. He reports current alcohol use. He reports that he does not use drugs.   Family History:  The patient's family history includes Alcohol abuse in his father; Alzheimer's disease in his mother; Cancer in his brother; Diabetes in his brother; Heart disease (age of onset: 20) in his father; Kidney failure in his brother; Osteoporosis in his mother.    ROS:  Please see the history of present illness.   Otherwise, review of systems are positive for shoulder pain.   All other systems are reviewed and negative.    PHYSICAL EXAM: VS:  BP (!) 158/86 (BP Location: Left Arm, Patient Position: Sitting, Cuff Size: Normal)   Ht 5\' 7"  (1.702 m)   Wt 192 lb 9.6 oz (87.4 kg)   BMI 30.17 kg/m  , BMI Body mass index is 30.17 kg/m. GENERAL:  Well appearing HEENT: Pupils equal round and reactive, fundi not visualized, oral mucosa unremarkable NECK:  No  jugular venous distention, waveform within normal limits, carotid upstroke brisk and symmetric, no bruits, no thyromegaly LUNGS:  Clear to auscultation bilaterally HEART:  RRR.  PMI not displaced or sustained,S1 and S2 within normal limits, no S3, no S4, no clicks, no rubs, no murmurs ABD:  Flat, positive bowel sounds normal in frequency in pitch, no bruits, no rebound, no guarding, no midline pulsatile mass, no hepatomegaly, no splenomegaly EXT:  2 plus pulses throughout, no edema, no cyanosis no clubbing SKIN:  No rashes no nodules NEURO:  Cranial nerves II through XII grossly intact, motor grossly intact throughout PSYCH:  Cognitively intact, oriented to person place and time    EKG:  EKG is ordered today.  04/22/16: Sinus rhythm rate 64 bpm. Nonspecific T wave changes. 01/28/18: Sinus rhythm.  PACs. 8/15;16: Sinus rhythm 93 bpm.  Non-specific t wave changes. 04/05/14: Ectopic atrial rhythm. Non-specific inferior T wave changes. 03/09/20: Sinus rhythm.  Rate 73 bpm. 09/12/2021: Sinus rhythm.  Rate 63 bpm.  Lexiscan Myoview 02/10/18: The left ventricular ejection fraction is mildly decreased (45-54%). Nuclear stress EF: 46%. Mild generalized hypokinesis There was no ST segment deviation noted during stress. The study is normal. This is a low risk study. No ischemia. Prior ECHO 2016 - normal EF.  Echo 02/23/18: Study Conclusions   - Left ventricle: The cavity size was normal. Systolic function was   normal. The estimated ejection fraction was in the range of 55%   to 60%. Wall motion was normal; there were no regional wall   motion abnormalities. Doppler parameters are consistent with   abnormal left ventricular relaxation (grade 1 diastolic   dysfunction). - Left atrium: The atrium was mildly dilated.  Carotid Doppler 02/10/18: 1-39% ICA stenosis bilaterally   Coronary CT-A 03/2020: IMPRESSION: 1. Coronary calcium score of 522. This was 72nd percentile for age and sex matched  control.   2. Normal coronary origin with right dominance.   3. There is moderate (CAD RADS3) plaque in the mid LAD and distal RCA.   4. There is concern that the mid LAD lesion could be obstructive. Will send study for FFRct.  FFR CT 03/30/20: IMPRESSION: 1. CT FFR analysis showed significant stenosis in the mid LAD at the level of D3. 2.  No other significant stenoses were identified.  LHC 04/06/20: Diagnostic Dominance: Right  Intervention     Recent Labs: 01/08/2021: ALT 21; BUN 14; Creat 0.62; Hemoglobin 15.5; Platelets 277; Potassium 4.3; Sodium 141    Lipid Panel    Component Value Date/Time   CHOL 161 07/30/2020 0824   TRIG 335 (H) 07/30/2020 0824   HDL 57 07/30/2020 0824   CHOLHDL 2.8 07/30/2020 0824   CHOLHDL 3.9 09/02/2016 1035   VLDL 43 (H) 09/02/2016 1035   LDLCALC 53 07/30/2020 0824   06/27/21:  Total cholesterol 129, triglycerides 102, HDL 48, LDL 61    Wt Readings from Last 3 Encounters:  09/12/21 192 lb 9.6 oz (87.4 kg)  01/08/21 195 lb (88.5 kg)  10/11/20 195 lb 8 oz (88.7 kg)      Other studies Reviewed: Additional studies/ records that were reviewed today include:. Review of the above records demonstrates:  Please see elsewhere in the note.     ASSESSMENT AND PLAN:  # CAD s/p PCI:  # Hyperlipidemia:  # Hypertriglyceridemia: Mr. Crisman underwent LAD PCI for unstable angina on 03/2020.  He will continue aspirin.   and clopidogrel through 03/2021.  Continue atorvastatin and metoprolol.  Lipids are much better controlled and triglycerides are <150.    # Carotid stenosis: Mild 04/2019.  Stable 03/2021.  Continue Xarelto and atorvastatin.    # Shortness of breath:  Check an echo.  If negative, increase physical activity to at least 150 minutes weekly.  # Paroxysmal atrial fibrillation:   Initially this was postoperative but he had recurrent atrial fibrillation on an ambulatory monitor.  Continue lifelong anticoagulation with Xarelto.  Continue  metoprolol.This patients CHA2DS2-VASc Score and unadjusted Ischemic Stroke Rate (% per year) is equal to 2.2 % stroke rate/year from a score of 2  Above score calculated as 1 point each if present [CHF, HTN, DM, Vascular=MI/PAD/Aortic Plaque, Age if 65-74, or Male] Above score calculated as 2 points each if present [Age > 75, or Stroke/TIA/TE]  # Hypertension: BP is elevated here and at home.  Increase losartan to 100mg  and check BMP ina  week.  Continue amlodipine and metoprolol.     Current medicines are reviewed at length with the patient today.  The patient does not have concerns regarding medicines.  The following changes have been made:  no change  Labs/ tests ordered today include:  Orders Placed This Encounter  Procedures   EKG 12-Lead   ECHOCARDIOGRAM COMPLETE      Disposition:   FU with Dr. Jonelle Sidle C. Oval Linsey 03/2021.  Signed, Skeet Latch, MD  09/12/2021 11:04 AM    [place]

## 2021-09-19 ENCOUNTER — Other Ambulatory Visit: Payer: Self-pay

## 2021-09-19 ENCOUNTER — Ambulatory Visit (INDEPENDENT_AMBULATORY_CARE_PROVIDER_SITE_OTHER): Payer: Medicare Other

## 2021-09-19 DIAGNOSIS — R0602 Shortness of breath: Secondary | ICD-10-CM

## 2021-09-19 LAB — ECHOCARDIOGRAM COMPLETE
AR max vel: 2.79 cm2
AV Area VTI: 3.06 cm2
AV Area mean vel: 2.86 cm2
AV Mean grad: 4 mmHg
AV Peak grad: 8.6 mmHg
AV Vena cont: 0.29 cm
Ao pk vel: 1.47 m/s
Area-P 1/2: 4.24 cm2
Calc EF: 59.4 %
S' Lateral: 3.07 cm
Single Plane A2C EF: 68 %
Single Plane A4C EF: 53 %

## 2021-09-26 ENCOUNTER — Ambulatory Visit (INDEPENDENT_AMBULATORY_CARE_PROVIDER_SITE_OTHER): Payer: Medicare Other

## 2021-09-26 ENCOUNTER — Encounter: Payer: Self-pay | Admitting: Podiatry

## 2021-09-26 ENCOUNTER — Ambulatory Visit (INDEPENDENT_AMBULATORY_CARE_PROVIDER_SITE_OTHER): Payer: Medicare Other | Admitting: Podiatry

## 2021-09-26 ENCOUNTER — Other Ambulatory Visit: Payer: Self-pay

## 2021-09-26 DIAGNOSIS — M19172 Post-traumatic osteoarthritis, left ankle and foot: Secondary | ICD-10-CM

## 2021-09-26 DIAGNOSIS — M778 Other enthesopathies, not elsewhere classified: Secondary | ICD-10-CM | POA: Diagnosis not present

## 2021-09-26 DIAGNOSIS — M722 Plantar fascial fibromatosis: Secondary | ICD-10-CM

## 2021-09-26 MED ORDER — TRIAMCINOLONE ACETONIDE 10 MG/ML IJ SUSP
10.0000 mg | Freq: Once | INTRAMUSCULAR | Status: AC
  Administered 2021-09-26: 10 mg

## 2021-09-26 NOTE — Progress Notes (Signed)
Subjective:   Patient ID: David Weaver, male   DOB: 70 y.o.   MRN: [id number]   HPI Patient states orthotics have bothered his left arch and while he overall does pretty good and works full-time he does still have swelling and discomfort and it started to hurt more on the bottom of the arch   ROS      Objective:  Physical Exam  Neurovascular status intact with patient having arthritis of the first metatarsocuneiform and probability for a Lisfranc's injury with lateral deviation of the second metatarsal with probability of arthritis with inflammation of the plantar arch with keratotic tissue     Assessment:  Probability for significant osteoarthritis of the first metatarsocuneiform second metatarsal cuneiform secondary to probable previous injury even though he cannot identify along with acute Planter fasciitis left     Plan:  H&P reviewed all conditions today I injected the mid arch area left 3 mg Kenalog 5 mg Xylocaine debrided some area discussed continued orthotic usage discussed MRI to consider surgical care here at work toward future.  Patient denies wanting this currently.  Patient may have to get this done understands this but wants to hold off currently  X-rays indicate that there is depression of the arch there is multiple signs of arthritis of the first metatarsal cuneiform and second metatarsocuneiform joint

## 2021-10-21 NOTE — Progress Notes (Signed)
Office Visit    Patient Name: David Weaver Date of Encounter: 10/22/2021  PCP:  Wardell Honour, MD   [facility]  Cardiologist:  Skeet Latch, MD  Advanced Practice Provider:  No care team member to display Electrophysiologist:  None     Chief Complaint    David Weaver is a 70 y.o. male with a hx of CAD s/p LAD PCI, PAF, HTN, mild carotid stenosis, lupus presents today for follow up after echo.    Past Medical History    Past Medical History:  Diagnosis Date   Alcohol abuse, daily use    Allergic rhinitis, cause unspecified    Angina pectoris ([place]) 03/26/2020   Atypical mole 10/19/2019   Right Cheek Mindi Slicker)   Dysthymic disorder    Esophageal reflux    Essential hypertension, benign    Fibromyalgia    followed by [name] every six months.   Fibromyositis    Hypertriglyceridemia 07/20/2020   Impotence of organic origin    Insomnia, unspecified    Left knee DJD    Lupus erythematosus    Osteoarthrosis, unspecified whether generalized or localized, lower leg    Other abnormal glucose    Other specified disorder of male genital organs(608.89)    Pure hypercholesterolemia 07/20/2020   S/P coronary artery stent placement 07/20/2020   LAD PCI 03/2020   Systemic lupus erythematosus ([place])    Unspecified hearing loss    Unspecified vitamin D deficiency    Past Surgical History:  Procedure Laterality Date   CARPAL TUNNEL RELEASE     bilateral   CHOLECYSTECTOMY     COLONOSCOPY  03/01/2014   two polyps; Dr. Benson Norway.  Repeat 5 years.   CORONARY STENT INTERVENTION N/A 04/06/2020   Procedure: CORONARY STENT INTERVENTION;  Surgeon: Wellington Hampshire, MD;  Location: [facility];  Service: Cardiovascular;  Laterality: N/A;   GANGLION CYST EXCISION  1980   Left wrist   JOINT REPLACEMENT  01/2013   right knee, left knee. [place].   KNEE ARTHROPLASTY     KNEE ARTHROSCOPY  2008   Right   LEFT HEART CATH AND CORONARY ANGIOGRAPHY N/A 04/06/2020    Procedure: LEFT HEART CATH AND CORONARY ANGIOGRAPHY;  Surgeon: Wellington Hampshire, MD;  Location: [facility];  Service: Cardiovascular;  Laterality: N/A;   lumbar spine epidural  2009   [facility]   ROTATOR CUFF REPAIR  10/2008   right   TOTAL KNEE ARTHROPLASTY  12/27/2012   Procedure: TOTAL KNEE ARTHROPLASTY;  Surgeon: [name], MD;  Location: [facility];  Service: Orthopedics;  Laterality: Right;  RIGHT ARTHROPLASTY KNEE MEDIAL AND LATERAL COMPARTMENTS WITH PATELLA RESURFACING, RIGHT TOTAL KNEE REPLACEMENT   TOTAL KNEE ARTHROPLASTY Left 01/31/2013   Procedure: TOTAL KNEE ARTHROPLASTY;  Surgeon: [name], MD;  Location: [facility];  Service: Orthopedics;  Laterality: Left;   TOTAL SHOULDER ARTHROPLASTY      Allergies  No Known Allergies  History of Present Illness    David Weaver is a 70 y.o. male with a hx of CAD s/p LAD PCI, PAF, HTN, mild carotid stenosis, lupus last seen 09/12/21 by Dr. Oval Linsey.  He was seen in clinic September 2016 for presurgical assessment and granted clearance.  During shoulder surgery he wanted atrial fibrillation with RVR.  He went back into sinus rhythm without intervention.  He was started on Eliquis and referred to cardiology.  Subsequent 30-day monitor with recurrent atrial fibrillation  and anticoagulation was continued.  Exercise Myoview March 2019 LVEF 46% with global hypokinesis and no ischemia.  He achieved 10.5 METS on Bruce protocol.  He was referred for echo with LVEF 55 to 71%, grade 1 diastolic dysfunction.  He had carotid Dopplers 2020 revealing 1-39% ICA stenosis bilaterally.  He had coronary CTA with significant obstruction in the mid LAD with left heart cath revealing 30% ostial LAD and 80% mid LAD stenosis successfully stented 03/2020.  Most recent carotid duplex 04/01/2021 with bilateral 1 to 39% stenosis.  Last seen 09/12/21 by Dr. Oval Linsey. Recommended for echo which was performed 09/19/21 and showed LVEF 55%, grade 1  diastolic dysfunction, normal RVSF, trivial AI. His shortness of breath was attributed to deconditioning and exercise recommended.   Presents today for follow up.  Echo reviewed in detail. Brings BP log from home. Often checks prior to medications. BP ranging 118/79-174/93. Average SBP in the 140s prior to medications.  Notes occasional episodes of lightheadedness once per week at which time he has to hold onto the counter for stability.  No near-syncope nor syncope.  He eats breakfast and dinner and often needs a snack in between.  Drinks mostly [place] throughout the day with occasional water.  Encouraged to increase water intake and decrease Mountain Dew intake. Reports no shortness of breath and only mild dyspnea on exertion which is improved compared to previous. Reports no chest pain, pressure, or tightness. No edema, orthopnea, PND. Reports no palpitations.  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  EKGs/Labs/Other Studies Reviewed:   The following studies were reviewed today:   Lexiscan Myoview 02/10/18: The left ventricular ejection fraction is mildly decreased (45-54%). Nuclear stress EF: 46%. Mild generalized hypokinesis There was no ST segment deviation noted during stress. The study is normal. This is a low risk study. No ischemia. Prior ECHO 2016 - normal EF.   Echo 02/23/18: Study Conclusions   - Left ventricle: The cavity size was normal. Systolic function was   normal. The estimated ejection fraction was in the range of 55%   to 60%. Wall motion was normal; there were no regional wall   motion abnormalities. Doppler parameters are consistent with   abnormal left ventricular relaxation (grade 1 diastolic   dysfunction). - Left atrium: The atrium was mildly dilated.   Carotid Doppler 02/10/18: 1-39% ICA stenosis bilaterally     Coronary CT-A 03/2020: IMPRESSION: 1. Coronary calcium score of 522. This was 72nd  percentile for age and sex matched control.   2. Normal coronary origin with right dominance.   3. There is moderate (CAD RADS3) plaque in the mid LAD and distal RCA.   4. There is concern that the mid LAD lesion could be obstructive. Will send study for FFRct.   FFR CT 03/30/20: IMPRESSION: 1. CT FFR analysis showed significant stenosis in the mid LAD at the level of D3. 2.  No other significant stenoses were identified.   LHC 04/06/20: Diagnostic Dominance: Right Intervention      Echo 09/19/21  1. Left ventricular ejection fraction, by estimation, is 55%. Left  ventricular ejection fraction by 2D MOD biplane is 59.4 %. The left  ventricle has normal function. The left ventricle has no regional wall  motion abnormalities. Left ventricular  diastolic parameters are consistent with Grade I diastolic dysfunction  (impaired relaxation).   2. Right ventricular systolic function is normal. The right ventricular  size  is normal.   3. Left atrial size was moderately dilated.   4. The mitral valve is grossly normal. No evidence of mitral valve  regurgitation. No evidence of mitral stenosis. Moderate mitral annular  calcification.   5. The aortic valve is tricuspid. Aortic valve regurgitation is trivial.  Mild aortic valve sclerosis is present, with no evidence of aortic valve  stenosis.   EKG: No EKG today  Recent Labs: 01/08/2021: ALT 21; BUN 14; Creat 0.62; Hemoglobin 15.5; Platelets 277; Potassium 4.3; Sodium 141  Recent Lipid Panel    Component Value Date/Time   CHOL 161 07/30/2020 0824   TRIG 335 (H) 07/30/2020 0824   HDL 57 07/30/2020 0824   CHOLHDL 2.8 07/30/2020 0824   CHOLHDL 3.9 09/02/2016 1035   VLDL 43 (H) 09/02/2016 1035   LDLCALC 53 07/30/2020 0824    Risk Assessment/Calculations:   CHA2DS2-VASc Score = 4  This indicates a 4.8% annual risk of stroke. The patient's score is based upon: CHF History: 0 HTN History: 1 Diabetes History: 1 Stroke History:  0 Vascular Disease History: 1 Age Score: 1 Gender Score: 0    Home Medications   Current Meds  Medication Sig   amLODipine (NORVASC) 10 MG tablet Take 10 mg by mouth daily.   amphetamine-dextroamphetamine (ADDERALL) 20 MG tablet Take 20 mg by mouth every morning.   atorvastatin (LIPITOR) 40 MG tablet Take 1 tablet (40 mg total) by mouth daily.   chlorpheniramine (CHLOR-TRIMETON) 4 MG tablet Take 4 mg by mouth 2 (two) times daily as needed for allergies.   Cholecalciferol (DIALYVITE VITAMIN D 5000) 125 MCG (5000 UT) capsule Take 5,000 Units by mouth daily.   colestipol (COLESTID) 1 g tablet Take 2 g by mouth 2 (two) times daily.   DULoxetine (CYMBALTA) 60 MG capsule Take 1 capsule (60 mg total) 2 (two) times daily by mouth.   folic acid (FOLVITE) 1 MG tablet Take 1 tablet by mouth daily.   hydrocortisone 2.5 % cream Apply 1 application topically 2 (two) times daily as needed (itching/rash).   hydroxychloroquine (PLAQUENIL) 200 MG tablet TAKE 1 TABLET BY MOUTH TWICE A DAY   ketoconazole (NIZORAL) 2 % cream Apply 1 application topically 2 (two) times daily as needed (lupus flare).   losartan (COZAAR) 100 MG tablet Take 1 tablet (100 mg total) by mouth daily.   methocarbamol (ROBAXIN) 500 MG tablet Take 500 mg by mouth 3 (three) times daily.   metoprolol tartrate (LOPRESSOR) 50 MG tablet Take 50 mg by mouth 2 (two) times daily.   mirtazapine (REMERON) 15 MG tablet Take 15 mg by mouth at bedtime.    ondansetron (ZOFRAN) 4 MG tablet TAKE 1 TABLET BY MOUTH EVERY 8 HOURS AS NEEDED FOR NAUSEA AND VOMITING   ONETOUCH ULTRA test strip 1 each daily.   Oxycodone HCl 10 MG TABS Take 10 mg by mouth every 6 (six) hours as needed (pain).    pantoprazole (PROTONIX) 40 MG tablet TAKE 1 TABLET BY MOUTH DAILY   predniSONE (DELTASONE) 10 MG tablet Take 10 mg by mouth daily.   pregabalin (LYRICA) 50 MG capsule Take 50 mg by mouth 2 (two) times daily.   rivaroxaban (XARELTO) 20 MG TABS tablet Take 1 tablet  (20 mg total) by mouth daily with supper.     Review of Systems      All other systems reviewed and are otherwise negative except as noted above.  Physical Exam    VS:  BP 130/82 (BP Location: Right Arm, Patient  Position: Sitting, Cuff Size: Normal)   Pulse 76   Ht 5\' 7"  (1.702 m)   Wt 190 lb (86.2 kg)   BMI 29.76 kg/m  , BMI Body mass index is 29.76 kg/m.  Wt Readings from Last 3 Encounters:  10/22/21 190 lb (86.2 kg)  09/12/21 192 lb 9.6 oz (87.4 kg)  01/08/21 195 lb (88.5 kg)    GEN: Well nourished, overweight, well developed, in no acute distress. HEENT: normal. Neck: Supple, no JVD, carotid bruits, or masses. Cardiac: RRR, no murmurs, rubs, or gallops. No clubbing, cyanosis, edema.  Radials/PT 2+ and equal bilaterally.  Respiratory:  Respirations regular and unlabored, clear to auscultation bilaterally. GI: Soft, nontender, nondistended. MS: No deformity or atrophy. Skin: Warm and dry, no rash. Neuro:  Strength and sensation are intact. Psych: Normal affect.  Assessment & Plan    CAD s/p PCI to LAD 03/2020 - Stable with no anginal symptoms. No indication for ischemic evaluation.  Recent echo 10/2021 with no wall motion abnormalities, normal LVEF, grade 1 diastolic dysfunction.  Continue GDMT including atorvastatin, metoprolol.  No aspirin due to chronic anticoagulation. Heart healthy diet and regular cardiovascular exercise encouraged.    HLD, LDL goal <70 -continue atorvastatin 40 mg daily.  Denies myalgias.  Carotid stenosis - Mild 04/2019. Stable by duplex 03/2021. Continue Xarelto and Atorvastatin.   HTN -home readings labile by arm cuff.  Encouraged to bring blood pressure cuff to next clinic visit to assess accuracy.  He does check prior to medications and encouraged to check at least 2 hours after his medications.  Systolic BP on average at home 140s.  Blood pressure well controlled today in clinic.  We will continue his current medications including losartan 100 mg  daily and amlodipine 10 mg daily.  We will repeat BMP today as he was transitioned to losartan 100 mg at last clinic visit and did not yet have his repeat labs collected.   Shortness of breath - Echo 08/2021 normal LVEF, gr1DD. Etiology likely deconditioning. Encouraged to gradually increase activity to AHA goal is 550 minutes/week.  He tells me his dyspnea is overall improving.  PAF / Chronic anticoagulation - CHA2DS2-VASc Score = 4 [CHF History: 0, HTN History: 1, Diabetes History: 1, Stroke History: 0, Vascular Disease History: 1, Age Score: 1, Gender Score: 0].  Therefore, the patient's annual risk of stroke is 4.8 %.    Denies palpitations.  Continue metoprolol tartrate 50 mg twice daily, Xarelto 20 mg daily.  Denies bleeding complications.  Disposition: Follow up in 5 month(s) with Skeet Latch, MD or APP.  Signed, Loel Dubonnet, NP 10/22/2021, 10:45 AM [place]

## 2021-10-22 ENCOUNTER — Encounter (HOSPITAL_BASED_OUTPATIENT_CLINIC_OR_DEPARTMENT_OTHER): Payer: Self-pay | Admitting: Family

## 2021-10-22 ENCOUNTER — Ambulatory Visit (INDEPENDENT_AMBULATORY_CARE_PROVIDER_SITE_OTHER): Payer: Medicare Other | Admitting: Family

## 2021-10-22 ENCOUNTER — Other Ambulatory Visit: Payer: Self-pay

## 2021-10-22 VITALS — BP 130/82 | HR 76 | Ht 67.0 in | Wt 190.0 lb

## 2021-10-22 DIAGNOSIS — E781 Pure hyperglyceridemia: Secondary | ICD-10-CM | POA: Diagnosis not present

## 2021-10-22 DIAGNOSIS — I4891 Unspecified atrial fibrillation: Secondary | ICD-10-CM

## 2021-10-22 DIAGNOSIS — I25118 Atherosclerotic heart disease of native coronary artery with other forms of angina pectoris: Secondary | ICD-10-CM

## 2021-10-22 DIAGNOSIS — E785 Hyperlipidemia, unspecified: Secondary | ICD-10-CM

## 2021-10-22 DIAGNOSIS — I10 Essential (primary) hypertension: Secondary | ICD-10-CM

## 2021-10-22 DIAGNOSIS — I480 Paroxysmal atrial fibrillation: Secondary | ICD-10-CM

## 2021-10-22 DIAGNOSIS — D6869 Other thrombophilia: Secondary | ICD-10-CM

## 2021-10-22 LAB — BASIC METABOLIC PANEL
BUN/Creatinine Ratio: 23 (ref 10–24)
BUN: 16 mg/dL (ref 8–27)
CO2: 25 mmol/L (ref 20–29)
Calcium: 9 mg/dL (ref 8.6–10.2)
Chloride: 101 mmol/L (ref 96–106)
Creatinine, Ser: 0.7 mg/dL — ABNORMAL LOW (ref 0.76–1.27)
Glucose: 89 mg/dL (ref 70–99)
Potassium: 4.4 mmol/L (ref 3.5–5.2)
Sodium: 140 mmol/L (ref 134–144)
eGFR: 98 mL/min/{1.73_m2} (ref >59)

## 2021-10-22 NOTE — Patient Instructions (Signed)
Medication Instructions:  Your Physician recommend you continue on your current medication as directed.    *If you need a refill on your cardiac medications before your next appointment, please call your pharmacy*   Lab Work: Your physician recommends that you return for lab work in: BMP-today  If you have labs (blood work) drawn today and your tests are completely normal, you will receive your results only by: MyChart Message (if you have MyChart) OR A paper copy in the mail If you have any lab test that is abnormal or we need to change your treatment, we will call you to review the results.   Testing/Procedures: Your Echo looked great!    Follow-Up: At [facility], you and your health needs are our priority.  As part of our continuing mission to provide you with exceptional heart care, we have created designated Provider Care Teams.  These Care Teams include your primary Cardiologist (physician) and Advanced Practice Providers (APPs -  Physician Assistants and Nurse Practitioners) who all work together to provide you with the care you need, when you need it.  We recommend signing up for the patient portal called "MyChart".  Sign up information is provided on this After Visit Summary.  MyChart is used to connect with patients for Virtual Visits (Telemedicine).  Patients are able to view lab/test results, encounter notes, upcoming appointments, etc.  Non-urgent messages can be sent to your provider as well.   To learn more about what you can do with MyChart, go to [email or web address].    Your next appointment:   April 2023  The format for your next appointment:   In Person  Provider:   Skeet Latch, MD    Other Instructions None today

## 2021-10-28 DIAGNOSIS — E119 Type 2 diabetes mellitus without complications: Secondary | ICD-10-CM | POA: Insufficient documentation

## 2021-11-27 ENCOUNTER — Other Ambulatory Visit: Payer: Self-pay

## 2021-11-27 ENCOUNTER — Ambulatory Visit: Payer: Medicare Other

## 2021-11-27 ENCOUNTER — Ambulatory Visit (INDEPENDENT_AMBULATORY_CARE_PROVIDER_SITE_OTHER): Payer: Medicare Other | Admitting: Podiatry

## 2021-11-27 ENCOUNTER — Encounter: Payer: Self-pay | Admitting: Podiatry

## 2021-11-27 DIAGNOSIS — M778 Other enthesopathies, not elsewhere classified: Secondary | ICD-10-CM

## 2021-11-27 DIAGNOSIS — I25118 Atherosclerotic heart disease of native coronary artery with other forms of angina pectoris: Secondary | ICD-10-CM | POA: Diagnosis not present

## 2021-11-27 DIAGNOSIS — M19172 Post-traumatic osteoarthritis, left ankle and foot: Secondary | ICD-10-CM

## 2021-11-27 DIAGNOSIS — R52 Pain, unspecified: Secondary | ICD-10-CM

## 2021-11-27 DIAGNOSIS — M069 Rheumatoid arthritis, unspecified: Secondary | ICD-10-CM

## 2021-11-27 DIAGNOSIS — M722 Plantar fascial fibromatosis: Secondary | ICD-10-CM

## 2021-11-27 DIAGNOSIS — M779 Enthesopathy, unspecified: Secondary | ICD-10-CM

## 2021-11-27 NOTE — Progress Notes (Signed)
Subjective:   Patient ID: David Weaver, male   DOB: 70 y.o.   MRN: [id number]   HPI Patient presents stating he is still having some tenderness foot but it is improving somewhat and he needs to have the orthotics adjusted and ultimately knows he may need surgery   ROS      Objective:  Physical Exam  Neurovascular status intact with patient's left foot showing swelling on the inside around the first metatarsocuneiform joint and lesser cuneiform metatarsal joints     Assessment:  Overall doing well does have inflammatory condition     Plan:  Reviewed condition with patient and I am having him meet with ped orthotist to try to redo his orthotics and we may get an MRI of this but we will continue conservative prior to considering that for the future.  He agrees with this plan and hopefully could have medication 3-4 times a year and it would keep it under control

## 2021-11-27 NOTE — Progress Notes (Signed)
SITUATION Reason for Consult: Follow-up with existing functional foot orthotics Patient / Caregiver Report: Patient reports the left foot orthosis is bothering his arch  OBJECTIVE DATA History / Diagnosis:    ICD-10-CM   1. Rheumatoid arthritis of left foot, unspecified whether rheumatoid factor present ([place])  M06.9     2. Plantar fasciitis of left foot  M72.2     3. Post-traumatic osteoarthritis of left foot  M19.172     4. Capsulitis  M77.9     5. Extensor tendinitis of foot  M77.8     6. Pain  R52       Change in Pathology: Left arch is collapsing  ACTIONS PERFORMED Patient's equipment was checked for structural stability and fit. Casted patient for re-do, as patient reports he is diabetic and is developing callousing at the left midfoot. Devices delivered in June will be redone to diabetic specification.  PLAN Patient to return for fitting in four weeks. Plan of care discussed with and agreed upon by patient / caregiver.

## 2021-11-28 ENCOUNTER — Encounter (HOSPITAL_BASED_OUTPATIENT_CLINIC_OR_DEPARTMENT_OTHER): Payer: Self-pay | Admitting: Cardiovascular Disease

## 2021-11-29 NOTE — Telephone Encounter (Signed)
Please advise 

## 2021-12-25 ENCOUNTER — Ambulatory Visit: Payer: Medicare Other

## 2021-12-25 ENCOUNTER — Other Ambulatory Visit: Payer: Self-pay

## 2021-12-25 DIAGNOSIS — M722 Plantar fascial fibromatosis: Secondary | ICD-10-CM

## 2021-12-25 DIAGNOSIS — M779 Enthesopathy, unspecified: Secondary | ICD-10-CM

## 2021-12-25 NOTE — Progress Notes (Signed)
SITUATION: Reason for Visit: Fitting and Delivery of Custom Fabricated Foot Orthoses Patient Report: Patient reports comfort and is satisfied with device.  OBJECTIVE DATA: Patient History / Diagnosis:     ICD-10-CM   1. Plantar fasciitis of left foot  M72.2     2. Capsulitis  M77.9       Provided Device:  Custom Functional Foot Orthotics     [facility] Labs: [id number]  GOAL OF ORTHOSIS - Improve gait - Decrease energy expenditure - Improve Balance - Provide Triplanar stability of foot complex - Facilitate motion  ACTIONS PERFORMED Patient was fit with foot orthotics trimmed to shoe last. Patient tolerated fittign procedure.   Patient was provided with verbal and written instruction and demonstration regarding donning, doffing, wear, care, proper fit, function, purpose, cleaning, and use of the orthosis and in all related precautions and risks and benefits regarding the orthosis.  Patient was also provided with verbal instruction regarding how to report any failures or malfunctions of the orthosis and necessary follow up care. Patient was also instructed to contact our office regarding any change in status that may affect the function of the orthosis.  Patient demonstrated independence with proper donning, doffing, and fit and verbalized understanding of all instructions.  PLAN: Patient is to follow up in one week or as necessary (PRN). All questions were answered and concerns addressed. Plan of care was discussed with and agreed upon by the patient.

## 2022-01-02 ENCOUNTER — Encounter: Payer: Self-pay | Admitting: Physician Assistant

## 2022-01-02 ENCOUNTER — Ambulatory Visit (INDEPENDENT_AMBULATORY_CARE_PROVIDER_SITE_OTHER): Payer: Medicare Other | Admitting: Physician Assistant

## 2022-01-02 ENCOUNTER — Other Ambulatory Visit: Payer: Self-pay

## 2022-01-02 DIAGNOSIS — Z86018 Personal history of other benign neoplasm: Secondary | ICD-10-CM

## 2022-01-02 DIAGNOSIS — Z1283 Encounter for screening for malignant neoplasm of skin: Secondary | ICD-10-CM

## 2022-01-02 DIAGNOSIS — L219 Seborrheic dermatitis, unspecified: Secondary | ICD-10-CM | POA: Diagnosis not present

## 2022-01-02 MED ORDER — KETOCONAZOLE 2 % EX CREA: 1.0000 "application " | TOPICAL_CREAM | Freq: Two times a day (BID) | CUTANEOUS | 8 refills | Status: DC | PRN

## 2022-01-22 ENCOUNTER — Ambulatory Visit (INDEPENDENT_AMBULATORY_CARE_PROVIDER_SITE_OTHER): Payer: Medicare Other | Admitting: Podiatry

## 2022-01-22 ENCOUNTER — Other Ambulatory Visit: Payer: Self-pay

## 2022-01-22 ENCOUNTER — Encounter: Payer: Self-pay | Admitting: Podiatry

## 2022-01-22 DIAGNOSIS — M722 Plantar fascial fibromatosis: Secondary | ICD-10-CM | POA: Diagnosis not present

## 2022-01-22 DIAGNOSIS — M779 Enthesopathy, unspecified: Secondary | ICD-10-CM | POA: Diagnosis not present

## 2022-01-22 NOTE — Progress Notes (Signed)
Subjective:   Patient ID: David Weaver, male   DOB: 71 y.o.   MRN: [id number]   HPI Patient states doing much better and the new orthotics have been beneficial   ROS      Objective:  Physical Exam  Neurovascular status intact reduction of edema pain around the first metatarsal cuneiform joint slight keratotic plantar tissue but minimally discomfort with no other pathology occurring currently     Assessment:  Seems to be improving with arthritis of the first metatarsal cuneiform joint left with orthotics     Plan:  Reviewed condition for orthotic continuation ice therapy and support and discussed at 1 point in future may require further injection treatment other treatment and ultimate fusion.  Reappoint as symptoms indicate

## 2022-01-26 ENCOUNTER — Encounter: Payer: Self-pay | Admitting: Physician Assistant

## 2022-01-26 NOTE — Progress Notes (Signed)
° °  Follow-Up Visit   Subjective  David Weaver is a 71 y.o. male who presents for the following: Annual Exam (Waist up General skin check patient stated he has some crusty lesions in the scalp. Personal h/o of atypia on the right cheek 2020).   The following portions of the chart were reviewed this encounter and updated as appropriate:  Tobacco   Allergies   Meds   Problems   Med Hx   Surg Hx   Fam Hx       Objective  Well appearing patient in no apparent distress; mood and affect are within normal limits.  All skin waist up examined.  waist up No atypical nevi No signs of non-mole skin cancer. Dyspigmented scar on his face was clear today.   Scalp Thick scale   Assessment & Plan  Encounter for screening for malignant neoplasm of skin waist up  Yearly skin check  Seborrheic dermatitis Scalp  ketoconazole (NIZORAL) 2 % cream - Scalp Apply 1 application topically 2 (two) times daily as needed (lupus flare).    I, Deneshia Zucker, PA-C, have reviewed all documentation's for this visit.  The documentation on 01/26/22 for the exam, diagnosis, procedures and orders are all accurate and complete.

## 2022-02-07 ENCOUNTER — Other Ambulatory Visit: Payer: Self-pay | Admitting: Rheumatology

## 2022-02-07 DIAGNOSIS — M359 Systemic involvement of connective tissue, unspecified: Secondary | ICD-10-CM

## 2022-04-17 ENCOUNTER — Encounter (HOSPITAL_BASED_OUTPATIENT_CLINIC_OR_DEPARTMENT_OTHER): Payer: Self-pay

## 2022-05-16 ENCOUNTER — Telehealth: Payer: Self-pay

## 2022-05-16 NOTE — Telephone Encounter (Signed)
   Pre-operative Risk Assessment    Patient Name: WILFRED DAYRIT  DOB: 07/06/1951 MRN: [id number]      Request for Surgical Clearance    Procedure:   left Total Shoulder Replacement  Date of Surgery:  Clearance TBD                                 Surgeon:  Ophelia Charter, MD Surgeon's Group or Practice Name:  [facility] Phone number:  [phone] Fax number:  [phone]   Type of Clearance Requested:   - Pharmacy:  Hold Rivaroxaban (Xarelto) 3   Type of Anesthesia:   Choice   Additional requests/questions:    Signed, Wonda Horner   05/16/2022, 10:03 AM

## 2022-05-19 ENCOUNTER — Telehealth: Payer: Self-pay | Admitting: *Deleted

## 2022-05-19 ENCOUNTER — Other Ambulatory Visit: Payer: Self-pay | Admitting: *Deleted

## 2022-05-19 DIAGNOSIS — Z122 Encounter for screening for malignant neoplasm of respiratory organs: Secondary | ICD-10-CM

## 2022-05-19 DIAGNOSIS — Z87891 Personal history of nicotine dependence: Secondary | ICD-10-CM

## 2022-05-19 NOTE — Telephone Encounter (Signed)
Patient with diagnosis of A fib on Xarelto for anticoagulation.    Procedure: left Total Shoulder Replacement Date of procedure: TBD   CHA2DS2-VASc Score = 4  This indicates a 4.8% annual risk of stroke. The patient's score is based upon: CHF History: 0 HTN History: 1 Diabetes History: 1 Stroke History: 0 Vascular Disease History: 1 Age Score: 1 Gender Score: 0   CrCl 100 mL/min Platelet count 279K   Per office protocol, patient can hold Xarelto for 3 days prior to procedure.

## 2022-05-19 NOTE — Telephone Encounter (Signed)
Pt agreeable to plan of care for tele pre op appt 05/20/22 @ 1 pm. Med rec and consent are done.     Patient Consent for Virtual Visit        David Weaver has provided verbal consent on 05/19/2022 for a virtual visit (video or telephone).   CONSENT FOR VIRTUAL VISIT FOR:  David Weaver  By participating in this virtual visit I agree to the following:  I hereby voluntarily request, consent and authorize [facility] and its employed or contracted physicians, physician assistants, nurse practitioners or other licensed health care professionals (the Practitioner), to provide me with telemedicine health care services (the "Services") as deemed necessary by the treating Practitioner. I acknowledge and consent to receive the Services by the Practitioner via telemedicine. I understand that the telemedicine visit will involve communicating with the Practitioner through live audiovisual communication technology and the disclosure of certain medical information by electronic transmission. I acknowledge that I have been given the opportunity to request an in-person assessment or other available alternative prior to the telemedicine visit and am voluntarily participating in the telemedicine visit.  I understand that I have the right to withhold or withdraw my consent to the use of telemedicine in the course of my care at any time, without affecting my right to future care or treatment, and that the Practitioner or I may terminate the telemedicine visit at any time. I understand that I have the right to inspect all information obtained and/or recorded in the course of the telemedicine visit and may receive copies of available information for a reasonable fee.  I understand that some of the potential risks of receiving the Services via telemedicine include:  Delay or interruption in medical evaluation due to technological equipment failure or disruption; Information transmitted may not be sufficient (e.g. poor  resolution of images) to allow for appropriate medical decision making by the Practitioner; and/or  In rare instances, security protocols could fail, causing a breach of personal health information.  Furthermore, I acknowledge that it is my responsibility to provide information about my medical history, conditions and care that is complete and accurate to the best of my ability. I acknowledge that Practitioner's advice, recommendations, and/or decision may be based on factors not within their control, such as incomplete or inaccurate data provided by me or distortions of diagnostic images or specimens that may result from electronic transmissions. I understand that the practice of medicine is not an exact science and that Practitioner makes no warranties or guarantees regarding treatment outcomes. I acknowledge that a copy of this consent can be made available to me via my patient portal ([facility]), or I can request a printed copy by calling the office of [facility].    I understand that my insurance will be billed for this visit.   I have read or had this consent read to me. I understand the contents of this consent, which adequately explains the benefits and risks of the Services being provided via telemedicine.  I have been provided ample opportunity to ask questions regarding this consent and the Services and have had my questions answered to my satisfaction. I give my informed consent for the services to be provided through the use of telemedicine in my medical care

## 2022-05-19 NOTE — Telephone Encounter (Signed)
Pt agreeable to plan of care for tele pre op appt 05/20/22 @ 1 pm. Med rec and consent are done.

## 2022-05-19 NOTE — Telephone Encounter (Signed)
Pharmacy has addressed anticoagulation.  Preoperative team, please contact this patient and set up a phone call appointment for further cardiac evaluation.  Thank you for your help.  Jossie Ng. Regena Delucchi NP-C    05/19/2022, 11:07 AM [place] [address] Office [phone] Fax [phone]

## 2022-05-20 ENCOUNTER — Ambulatory Visit (INDEPENDENT_AMBULATORY_CARE_PROVIDER_SITE_OTHER): Payer: Medicare Other | Admitting: Physician Assistant

## 2022-05-20 DIAGNOSIS — Z7901 Long term (current) use of anticoagulants: Secondary | ICD-10-CM | POA: Diagnosis not present

## 2022-05-20 DIAGNOSIS — Z01810 Encounter for preprocedural cardiovascular examination: Secondary | ICD-10-CM | POA: Diagnosis not present

## 2022-05-20 NOTE — Progress Notes (Signed)
Virtual Visit via Telephone Note   Because of David Weaver's co-morbid illnesses, he is at least at moderate risk for complications without adequate follow up.  This format is felt to be most appropriate for this patient at this time.  The patient did not have access to video technology/had technical difficulties with video requiring transitioning to audio format only (telephone).  All issues noted in this document were discussed and addressed.  No physical exam could be performed with this format.  Please refer to the patient's chart for his consent to telehealth for [facility].  Evaluation Performed:  Preoperative cardiovascular risk assessment _____________   Date:  05/20/2022   Patient ID:  David, Weaver 1951/01/27, MRN [id number] Patient Location:  Home Provider location:   Office  Primary Care Provider:  Wardell Honour, MD Primary Cardiologist:  Skeet Latch, MD  Chief Complaint / Patient Profile   71 y.o. y/o male with a h/o CAD s/p LAD PCI, PAF, HN, mild carotid artery stenosis, and lupus who is pending left total shoulder replacement and presents today for telephonic preoperative cardiovascular risk assessment.  Past Medical History    Past Medical History:  Diagnosis Date   Alcohol abuse, daily use    Allergic rhinitis, cause unspecified    Angina pectoris ([place]) 03/26/2020   Atypical mole 10/19/2019   Right Cheek Mindi Slicker)   Dysthymic disorder    Esophageal reflux    Essential hypertension, benign    Fibromyalgia    followed by [name] every six months.   Fibromyositis    Hypertriglyceridemia 07/20/2020   Impotence of organic origin    Insomnia, unspecified    Left knee DJD    Lupus erythematosus    Osteoarthrosis, unspecified whether generalized or localized, lower leg    Other abnormal glucose    Other specified disorder of male genital organs(608.89)    Pure hypercholesterolemia 07/20/2020   S/P coronary artery stent placement 07/20/2020    LAD PCI 03/2020   Systemic lupus erythematosus ([place])    Unspecified hearing loss    Unspecified vitamin D deficiency    Past Surgical History:  Procedure Laterality Date   CARPAL TUNNEL RELEASE     bilateral   CHOLECYSTECTOMY     COLONOSCOPY  03/01/2014   two polyps; Dr. Benson Norway.  Repeat 5 years.   CORONARY STENT INTERVENTION N/A 04/06/2020   Procedure: CORONARY STENT INTERVENTION;  Surgeon: Wellington Hampshire, MD;  Location: [facility];  Service: Cardiovascular;  Laterality: N/A;   GANGLION CYST EXCISION  1980   Left wrist   JOINT REPLACEMENT  01/2013   right knee, left knee. [place].   KNEE ARTHROPLASTY     KNEE ARTHROSCOPY  2008   Right   LEFT HEART CATH AND CORONARY ANGIOGRAPHY N/A 04/06/2020   Procedure: LEFT HEART CATH AND CORONARY ANGIOGRAPHY;  Surgeon: Wellington Hampshire, MD;  Location: [facility];  Service: Cardiovascular;  Laterality: N/A;   lumbar spine epidural  2009   [facility]   ROTATOR CUFF REPAIR  10/2008   right   TOTAL KNEE ARTHROPLASTY  12/27/2012   Procedure: TOTAL KNEE ARTHROPLASTY;  Surgeon: [name], MD;  Location: [facility];  Service: Orthopedics;  Laterality: Right;  RIGHT ARTHROPLASTY KNEE MEDIAL AND LATERAL COMPARTMENTS WITH PATELLA RESURFACING, RIGHT TOTAL KNEE REPLACEMENT   TOTAL KNEE ARTHROPLASTY Left 01/31/2013   Procedure: TOTAL KNEE ARTHROPLASTY;  Surgeon: [name], MD;  Location: [facility];  Service: Orthopedics;  Laterality: Left;   TOTAL SHOULDER ARTHROPLASTY      Allergies  No Known Allergies  History of Present Illness    David Weaver is a 71 y.o. male who presents via audio/video conferencing for a telehealth visit today.  Pt was last seen in cardiology clinic on 10/22/21 by Laurann Montana NP.  At that time David Weaver was doing well.  The patient is now pending procedure as outlined above. Since his last visit, he continues to work and can complete more than 4.0 METS without angina.    Home Medications    Prior  to Admission medications   Medication Sig Start Date End Date Taking? Authorizing Provider  amLODipine (NORVASC) 10 MG tablet Take 10 mg by mouth daily.    [provider]  amphetamine-dextroamphetamine (ADDERALL) 20 MG tablet Take 20 mg by mouth every morning. 01/26/20   [provider]  atorvastatin (LIPITOR) 40 MG tablet Take 1 tablet (40 mg total) by mouth daily. 04/27/20   Almyra Deforest, PA  chlorpheniramine (CHLOR-TRIMETON) 4 MG tablet Take 4 mg by mouth 2 (two) times daily as needed for allergies.    [provider]  Cholecalciferol (DIALYVITE VITAMIN D 5000) 125 MCG (5000 UT) capsule Take 5,000 Units by mouth daily.    [provider]  colestipol (COLESTID) 1 g tablet Take 2 g by mouth 2 (two) times daily. 03/05/18   [provider]  DULoxetine (CYMBALTA) 60 MG capsule Take 1 capsule (60 mg total) 2 (two) times daily by mouth. 10/13/17   Wardell Honour, MD  folic acid (FOLVITE) 1 MG tablet Take 1 tablet by mouth daily. 07/02/21   [provider]  hydrocortisone 2.5 % cream Apply 1 application topically 2 (two) times daily as needed (itching/rash).    [provider]  hydroxychloroquine (PLAQUENIL) 200 MG tablet TAKE 1 TABLET BY MOUTH TWICE A DAY 01/07/21   Ofilia Neas, PA-C  ketoconazole (NIZORAL) 2 % cream Apply 1 application topically 2 (two) times daily as needed (lupus flare). 01/02/22   Sheffield, Ronalee Red, PA-C  losartan (COZAAR) 100 MG tablet Take 1 tablet (100 mg total) by mouth daily. 09/12/21   Skeet Latch, MD  metFORMIN (GLUCOPHAGE) 500 MG tablet Take by mouth. 09/22/20 05/19/22  [provider]  methocarbamol (ROBAXIN) 500 MG tablet Take 500 mg by mouth 3 (three) times daily.    [provider]  metoprolol tartrate (LOPRESSOR) 50 MG tablet Take 50 mg by mouth 2 (two) times daily.    [provider]  mirtazapine (REMERON) 15 MG tablet Take 15 mg by mouth at bedtime.  04/14/19   [provider]  nystatin (MYCOSTATIN) 100000 UNIT/ML suspension Take 5 mLs by mouth 4 (four) times daily. 10/19/21   [provider]  ondansetron (ZOFRAN) 4 MG tablet TAKE 1 TABLET BY MOUTH EVERY 8 HOURS AS NEEDED FOR NAUSEA AND VOMITING 03/21/19   Horald Pollen, MD  [facility] ULTRA test strip 1 each daily. 09/22/20   [provider]  Oxycodone HCl 10 MG TABS Take 10 mg by mouth every 6 (six) hours as needed (pain).  02/02/20   [provider]  pantoprazole (PROTONIX) 40 MG tablet TAKE 1 TABLET BY MOUTH DAILY 07/24/20   Skeet Latch, MD  predniSONE (DELTASONE) 10 MG tablet Take 10 mg by mouth daily. 08/07/20   [provider]  pregabalin (LYRICA) 50 MG capsule Take 50 mg by mouth 2 (two) times daily. 10/03/20   [provider]  rivaroxaban (XARELTO) 20 MG TABS tablet Take 1 tablet (20 mg total) by mouth daily with supper. 06/08/20   Deberah Pelton, NP    Physical Exam    Vital Signs:  David Weaver does not have vital signs available for review today.  Given telephonic nature of communication, physical exam is limited. AAOx3. NAD. Normal affect.  Speech and respirations are unlabored.  Accessory Clinical Findings    None  Assessment & Plan    1.  Preoperative Cardiovascular Risk Assessment:  He has a history of CAD with prior stenting.  He can complete more than 4.0 METS without angina.  According to the RCRI, he has a 0.9% risk of Mace during the perioperative period.  He understands his risk and wishes to proceed.  Therefore, based on ACC/AHA guidelines, the patient would be at acceptable risk for the planned procedure without further cardiovascular testing.   The patient was advised that if he develops new symptoms prior to surgery to contact our office to arrange for a follow-up visit, and he verbalized understanding.    2. Anticoagulation hold Patient with diagnosis of A fib on Xarelto for anticoagulation.     Procedure: left Total  Shoulder Replacement Date of procedure: TBD     CHA2DS2-VASc Score = 4  This indicates a 4.8% annual risk of stroke. The patient's score is based upon: CHF History: 0 HTN History: 1 Diabetes History: 1 Stroke History: 0 Vascular Disease History: 1 Age Score: 1 Gender Score: 0   CrCl 100 mL/min Platelet count 279K     Per office protocol, patient can hold Xarelto for 3 days prior to procedure.   A copy of this note will be routed to requesting surgeon.  Time:   Today, I have spent 8 minutes with the patient with telehealth technology discussing medical history, symptoms, and management plan.     Tami Lin Reid Nawrot, PA  05/20/2022, 1:18 PM

## 2022-06-10 ENCOUNTER — Ambulatory Visit
Admission: RE | Admit: 2022-06-10 | Discharge: 2022-06-10 | Disposition: A | Discharge status: Home / Self Care | Payer: Medicare Other | Source: Ambulatory Visit | Attending: Acute Care | Admitting: Acute Care

## 2022-06-10 ENCOUNTER — Encounter: Payer: Self-pay | Admitting: Acute Care

## 2022-06-10 ENCOUNTER — Ambulatory Visit (INDEPENDENT_AMBULATORY_CARE_PROVIDER_SITE_OTHER): Payer: Medicare Other | Admitting: Acute Care

## 2022-06-10 DIAGNOSIS — Z122 Encounter for screening for malignant neoplasm of respiratory organs: Secondary | ICD-10-CM

## 2022-06-10 DIAGNOSIS — Z87891 Personal history of nicotine dependence: Secondary | ICD-10-CM

## 2022-06-10 NOTE — Patient Instructions (Signed)
Thank you for participating in the [facility] Screening Program. It was our pleasure to meet you today. We will call you with the results of your scan within the next few days. Your scan will be assigned a Lung RADS category score by the physicians reading the scans.  This Lung RADS score determines follow up scanning.  See below for description of categories, and follow up screening recommendations. We will be in touch to schedule your follow up screening annually or based on recommendations of our providers. We will fax a copy of your scan results to your Primary Care Physician, or the physician who referred you to the program, to ensure they have the results. Please call the office if you have any questions or concerns regarding your scanning experience or results.  Our office number is [phone]. Please speak with Denise Phelps, RN. , or  Denise Buckner RN, They are  our Lung Cancer Screening RN.'s If They are unavailable when you call, Please leave a message on the voice mail. We will return your call at our earliest convenience.This voice mail is monitored several times a day.  Remember, if your scan is normal, we will scan you annually as long as you continue to meet the criteria for the program. (Age 55-77, Current smoker or smoker who has quit within the last 15 years). If you are a smoker, remember, quitting is the single most powerful action that you can take to decrease your risk of lung cancer and other pulmonary, breathing related problems. We know quitting is hard, and we are here to help.  Please let us know if there is anything we can do to help you meet your goal of quitting. If you are a former smoker, congratulations. We are proud of you! Remain smoke free! Remember you can refer friends or family members through the number above.  We will screen them to make sure they meet criteria for the program. Thank you for helping us take better care of you by  participating in Lung Screening.  You can receive free nicotine replacement therapy ( patches, gum or mints) by calling 1-800-QUIT NOW. Please call so we can get you on the path to becoming  a non-smoker. I know it is hard, but you can do this!  Lung RADS Categories:  Lung RADS 1: no nodules or definitely non-concerning nodules.  Recommendation is for a repeat annual scan in 12 months.  Lung RADS 2:  nodules that are non-concerning in appearance and behavior with a very low likelihood of becoming an active cancer. Recommendation is for a repeat annual scan in 12 months.  Lung RADS 3: nodules that are probably non-concerning , includes nodules with a low likelihood of becoming an active cancer.  Recommendation is for a 6-month repeat screening scan. Often noted after an upper respiratory illness. We will be in touch to make sure you have no questions, and to schedule your 6-month scan.  Lung RADS 4 A: nodules with concerning findings, recommendation is most often for a follow up scan in 3 months or additional testing based on our provider's assessment of the scan. We will be in touch to make sure you have no questions and to schedule the recommended 3 month follow up scan.  Lung RADS 4 B:  indicates findings that are concerning. We will be in touch with you to schedule additional diagnostic testing based on our provider's  assessment of the scan.  Other options for assistance in smoking cessation (   As covered by your insurance benefits)  Hypnosis for smoking cessation  [employer] [phone]  Acupuncture for smoking cessation  [facility] [phone]   

## 2022-06-10 NOTE — Progress Notes (Signed)
Virtual Visit via Telephone Note  I connected with David Weaver on 10/15/21 at  2:00 PM EST by telephone and verified that I am speaking with the correct person using two identifiers.  Location: Patient: Home Provider: Working from home   I discussed the limitations, risks, security and privacy concerns of performing an evaluation and management service by telephone and the availability of in person appointments. I also discussed with the patient that there may be a patient responsible charge related to this service. The patient expressed understanding and agreed to proceed.  Shared Decision Making Visit Lung Cancer Screening Program [phone])   Eligibility: Age 71 y.o. Pack Years Smoking History Calculation 75 (# packs/per year x # years smoked) Recent History of coughing up blood  no Unexplained weight loss? no ( >Than 15 pounds within the last 6 months ) Prior History Lung / other cancer no (Diagnosis within the last 5 years already requiring surveillance chest CT Scans). Smoking Status Former Smoker Former Smokers: Years since quit: 7 years  Quit Date: 2016  Visit Components: Discussion included one or more decision making aids. yes Discussion included risk/benefits of screening. yes Discussion included potential follow up diagnostic testing for abnormal scans. yes Discussion included meaning and risk of over diagnosis. yes Discussion included meaning and risk of False Positives. yes Discussion included meaning of total radiation exposure. yes  Counseling Included: Importance of adherence to annual lung cancer LDCT screening. yes Impact of comorbidities on ability to participate in the program. yes Ability and willingness to under diagnostic treatment. yes  Smoking Cessation Counseling: Current Smokers:  Discussed importance of smoking cessation. yes Information about tobacco cessation classes and interventions provided to patient. yes Patient provided with "ticket" for  LDCT Scan. yes Symptomatic Patient. no  Counseling NA Diagnosis Code: Tobacco Use Z72.0 Asymptomatic Patient yes  Counseling NA Former Smokers:  Discussed the importance of maintaining cigarette abstinence. yes Diagnosis Code: Personal History of Nicotine Dependence. W54.627 Information about tobacco cessation classes and interventions provided to patient. Yes Patient provided with "ticket" for LDCT Scan. yes Written Order for Lung Cancer Screening with LDCT placed in Epic. Yes (CT Chest Lung Cancer Screening Low Dose W/O CM) [id number] Z12.2-Screening of respiratory organs Z87.891-Personal history of nicotine dependence   I spent 25 minutes of face to face time with him discussing the risks and benefits of lung cancer screening. We viewed a power point together that explained in detail the above noted topics. We took the time to pause the power point at intervals to allow for questions to be asked and answered to ensure understanding. We discussed that he had taken the single most powerful action possible to decrease his risk of developing lung cancer when he quit smoking. I counseled him to remain smoke free, and to contact me if he ever had the desire to smoke again so that I can provide resources and tools to help support the effort to remain smoke free. We discussed the time and location of the scan, and that either  David Glassman RN or I will call with the results within  24-48 hours of receiving them. He has my card and contact information in the event he needs to speak with me, in addition to a copy of the power point we reviewed as a resource. He verbalized understanding of all of the above and had no further questions upon leaving the office.     I explained to the patient that there has been a high incidence of  coronary artery disease noted on these exams. I explained that this is a non-gated exam therefore degree or severity cannot be determined. This patient is on statin therapy. I  have asked the patient to follow-up with their PCP regarding any incidental finding of coronary artery disease and management with diet or medication as they feel is clinically indicated. The patient verbalized understanding of the above and had no further questions.    Surah Pelley D. Kenton Kingfisher, NP-C [facility] Personal contact information can be found on Amion  06/10/2022, 9:40 AM

## 2022-06-12 ENCOUNTER — Other Ambulatory Visit: Payer: Self-pay

## 2022-06-12 DIAGNOSIS — Z87891 Personal history of nicotine dependence: Secondary | ICD-10-CM

## 2022-06-12 DIAGNOSIS — Z122 Encounter for screening for malignant neoplasm of respiratory organs: Secondary | ICD-10-CM

## 2022-06-18 ENCOUNTER — Ambulatory Visit (INDEPENDENT_AMBULATORY_CARE_PROVIDER_SITE_OTHER): Payer: Medicare Other | Admitting: Podiatry

## 2022-06-18 ENCOUNTER — Encounter: Payer: Self-pay | Admitting: Podiatry

## 2022-06-18 ENCOUNTER — Ambulatory Visit: Payer: Medicare Other

## 2022-06-18 DIAGNOSIS — M722 Plantar fascial fibromatosis: Secondary | ICD-10-CM

## 2022-06-18 DIAGNOSIS — M778 Other enthesopathies, not elsewhere classified: Secondary | ICD-10-CM

## 2022-06-18 DIAGNOSIS — M779 Enthesopathy, unspecified: Secondary | ICD-10-CM

## 2022-06-18 MED ORDER — TRIAMCINOLONE ACETONIDE 10 MG/ML IJ SUSP
10.0000 mg | Freq: Once | INTRAMUSCULAR | Status: AC
  Administered 2022-06-18: 10 mg

## 2022-06-18 NOTE — Patient Instructions (Addendum)
DUE TO COVID-19 ONLY TWO VISITORS  (aged 73 and older)  ARE ALLOWED TO COME WITH YOU AND STAY IN THE WAITING ROOM ONLY DURING PRE OP AND PROCEDURE.   **NO VISITORS ARE ALLOWED IN THE SHORT STAY AREA OR RECOVERY ROOM!!**    Your procedure is scheduled on: 06/25/22   Report to [facility] Main Entrance    Report to admitting at 7:30 AM   Call this number if you have problems the morning of surgery [phone]   Do not eat food :After Midnight.   After Midnight you may have the following liquids until 7:00 AM DAY OF SURGERY  Water Black Coffee (sugar ok, NO MILK/CREAM OR CREAMERS)  Tea (sugar ok, NO MILK/CREAM OR CREAMERS) regular and decaf                             Plain Jell-O (NO RED)                                           Fruit ices (not with fruit pulp, NO RED)                                     Popsicles (NO RED)                                                                  Juice: apple, WHITE grape, WHITE cranberry Sports drinks like Gatorade (NO RED)                 The day of surgery:  Drink ONE (1) Pre-Surgery G2 at 7:00 AM the morning of surgery. Drink in one sitting. Do not sip.  This drink was given to you during your hospital  pre-op appointment visit. Nothing else to drink after completing the  Pre-Surgery G2.          If you have questions, please contact your surgeon's office.   FOLLOW BOWEL PREP AND ANY ADDITIONAL PRE OP INSTRUCTIONS YOU RECEIVED FROM YOUR SURGEON'S OFFICE!!!     Oral Hygiene is also important to reduce your risk of infection.                                    Remember - BRUSH YOUR TEETH THE MORNING OF SURGERY WITH YOUR REGULAR TOOTHPASTE   Take these medicines the morning of surgery with A SIP OF WATER: Amlodipine, Atorvastatin, Duloxetine, Metoprolol, Zofran, Oxycodone, Pantoprazole, Prednisone  These are anesthesia recommendations for holding your anticoagulants.  Please contact your prescribing physician to confirm IF  it is safe to hold your anticoagulants for this length of time.   Eliquis Apixaban   72 hours   Xarelto Rivaroxaban   72 hours  Plavix Clopidogrel   120 hours  Pletal Cilostazol   120 hours      DO NOT TAKE ANY ORAL DIABETIC MEDICATIONS DAY OF YOUR SURGERY  How to Manage Your Diabetes Before and After Surgery  Why is it important to control my blood sugar before and after surgery? Improving blood sugar levels before and after surgery helps healing and can limit problems. A way of improving blood sugar control is eating a healthy diet by:  Eating less sugar and carbohydrates  Increasing activity/exercise  Talking with your doctor about reaching your blood sugar goals High blood sugars (greater than 180 mg/dL) can raise your risk of infections and slow your recovery, so you will need to focus on controlling your diabetes during the weeks before surgery. Make sure that the doctor who takes care of your diabetes knows about your planned surgery including the date and location.  How do I manage my blood sugar before surgery? Check your blood sugar at least 4 times a day, starting 2 days before surgery, to make sure that the level is not too high or low. Check your blood sugar the morning of your surgery when you wake up and every 2 hours until you get to the Short Stay unit. If your blood sugar is less than 70 mg/dL, you will need to treat for low blood sugar: Do not take insulin. Treat a low blood sugar (less than 70 mg/dL) with  cup of clear juice (cranberry or apple), 4 glucose tablets, OR glucose gel. Recheck blood sugar in 15 minutes after treatment (to make sure it is greater than 70 mg/dL). If your blood sugar is not greater than 70 mg/dL on recheck, call [phone] for further instructions. Report your blood sugar to the short stay nurse when you get to Short Stay.  If you are admitted to the hospital after surgery: Your blood sugar will be checked by the staff and you will  probably be given insulin after surgery (instead of oral diabetes medicines) to make sure you have good blood sugar levels. The goal for blood sugar control after surgery is 80-180 mg/dL.   WHAT DO I DO ABOUT MY DIABETES MEDICATION?  Do not take oral diabetes medicines (pills) the morning of surgery.  THE DAY BEFORE SURGERY, take Metformin as prescribed      THE MORNING OF SURGERY, do not take Metformin   Reviewed and Endorsed by [facility] Patient Education Committee, August 2015                               You may not have any metal on your body including jewelry, and body piercing             Do not wear lotions, powders, cologne, or deodorant              Men may shave face and neck.   Do not bring valuables to the hospital. [place].   Contacts, dentures or bridgework may not be worn into surgery.  DO NOT [place]. PHARMACY WILL DISPENSE MEDICATIONS LISTED ON YOUR MEDICATION LIST TO YOU DURING YOUR ADMISSION [place]!    Patients discharged on the day of surgery will not be allowed to drive home.  Someone NEEDS to stay with you for the first 24 hours after anesthesia.              Please read over the following fact sheets you were given: IF YOU HAVE QUESTIONS ABOUT YOUR PRE-OP INSTRUCTIONS PLEASE CALL [phone]- Apolonio Schneiders  [facility] - Preparing for Surgery Before surgery, you can play an important role.  Because skin is not sterile, your skin needs to be as free of germs as possible.  You can reduce the number of germs on your skin by washing with CHG (chlorahexidine gluconate) soap before surgery.  CHG is an antiseptic cleaner which kills germs and bonds with the skin to continue killing germs even after washing. Please DO NOT use if you have an allergy to CHG or antibacterial soaps.  If your skin becomes reddened/irritated stop using the CHG and inform your nurse when you  arrive at Short Stay. Do not shave (including legs and underarms) for at least 48 hours prior to the first CHG shower.  You may shave your face/neck.  Please follow these instructions carefully:  1.  Shower with CHG Soap the night before surgery and the  morning of surgery.  2.  If you choose to wash your hair, wash your hair first as usual with your normal  shampoo.  3.  After you shampoo, rinse your hair and body thoroughly to remove the shampoo.                             4.  Use CHG as you would any other liquid soap.  You can apply chg directly to the skin and wash.  Gently with a scrungie or clean washcloth.  5.  Apply the CHG Soap to your body ONLY FROM THE NECK DOWN.   Do   not use on face/ open                           Wound or open sores. Avoid contact with eyes, ears mouth and   genitals (private parts).                       Wash face,  Genitals (private parts) with your normal soap.             6.  Wash thoroughly, paying special attention to the area where your    surgery  will be performed.  7.  Thoroughly rinse your body with warm water from the neck down.  8.  DO NOT shower/wash with your normal soap after using and rinsing off the CHG Soap.                9.  Pat yourself dry with a clean towel.            10.  Wear clean pajamas.            11.  Place clean sheets on your bed the night of your first shower and do not  sleep with pets. Day of Surgery : Do not apply any lotions/deodorants the morning of surgery.  Please wear clean clothes to the hospital/surgery center.  FAILURE TO FOLLOW THESE INSTRUCTIONS MAY RESULT IN THE CANCELLATION OF YOUR SURGERY  PATIENT SIGNATURE_________________________________  NURSE SIGNATURE__________________________________  ________________________________________________________________________   Adam Phenix  An incentive spirometer is a tool that can help keep your lungs clear and active. This tool measures how well you are  filling your lungs with each breath. Taking long deep breaths may help reverse or decrease the chance of developing breathing (pulmonary) problems (especially infection) following: A long period of time when you are unable to move or be active. BEFORE THE PROCEDURE  If  the spirometer includes an indicator to show your best effort, your nurse or respiratory therapist will set it to a desired goal. If possible, sit up straight or lean slightly forward. Try not to slouch. Hold the incentive spirometer in an upright position. INSTRUCTIONS FOR USE  Sit on the edge of your bed if possible, or sit up as far as you can in bed or on a chair. Hold the incentive spirometer in an upright position. Breathe out normally. Place the mouthpiece in your mouth and seal your lips tightly around it. Breathe in slowly and as deeply as possible, raising the piston or the ball toward the top of the column. Hold your breath for 3-5 seconds or for as long as possible. Allow the piston or ball to fall to the bottom of the column. Remove the mouthpiece from your mouth and breathe out normally. Rest for a few seconds and repeat Steps 1 through 7 at least 10 times every 1-2 hours when you are awake. Take your time and take a few normal breaths between deep breaths. The spirometer may include an indicator to show your best effort. Use the indicator as a goal to work toward during each repetition. After each set of 10 deep breaths, practice coughing to be sure your lungs are clear. If you have an incision (the cut made at the time of surgery), support your incision when coughing by placing a pillow or rolled up towels firmly against it. Once you are able to get out of bed, walk around indoors and cough well. You may stop using the incentive spirometer when instructed by your caregiver.  RISKS AND COMPLICATIONS Take your time so you do not get dizzy or light-headed. If you are in pain, you may need to take or ask for pain  medication before doing incentive spirometry. It is harder to take a deep breath if you are having pain. AFTER USE Rest and breathe slowly and easily. It can be helpful to keep track of a log of your progress. Your caregiver can provide you with a simple table to help with this. If you are using the spirometer at home, follow these instructions: [place] [facility] IF:  You are having difficultly using the spirometer. You have trouble using the spirometer as often as instructed. Your pain medication is not giving enough relief while using the spirometer. You develop fever of 100.5 F (38.1 C) or higher. SEEK IMMEDIATE MEDICAL CARE IF:  You cough up bloody sputum that had not been present before. You develop fever of 102 F (38.9 C) or greater. You develop worsening pain at or near the incision site. MAKE SURE YOU:  Understand these instructions. Will watch your condition. Will get help right away if you are not doing well or get worse. Document Released: 03/30/2007 Document Revised: 02/09/2012 Document Reviewed: 05/31/2007 ExitCare Patient Information 2014 [place].   ________________________________________________________________________  [facility]- Preparing for Total Shoulder Arthroplasty    Before surgery, you can play an important role. Because skin is not sterile, your skin needs to be as free of germs as possible. You can reduce the number of germs on your skin by using the following products. Benzoyl Peroxide Gel Reduces the number of germs present on the skin Applied twice a day to shoulder area starting two days before surgery    ==================================================================  Please follow these instructions carefully:  BENZOYL PEROXIDE 5% GEL  Please do not use if you have an allergy to benzoyl peroxide.   If  your skin becomes reddened/irritated stop using the benzoyl peroxide.  Starting two days before surgery, apply as follows: Apply  benzoyl peroxide in the morning and at night. Apply after taking a shower. If you are not taking a shower clean entire shoulder front, back, and side along with the armpit with a clean wet washcloth.  Place a quarter-sized dollop on your shoulder and rub in thoroughly, making sure to cover the front, back, and side of your shoulder, along with the armpit.   2 days before ____ AM   ____ PM              1 day before ____ AM   ____ PM                         Do this twice a day for two days.  (Last application is the night before surgery, AFTER using the CHG soap as described below).  Do NOT apply benzoyl peroxide gel on the day of surgery.

## 2022-06-18 NOTE — Progress Notes (Addendum)
COVID Vaccine Completed: yes x5  Date of COVID positive in last 90 days: no  PCP - Reginia Forts, MD Cardiologist - Steele Sizer, MD  Cardiac clearance by Fabian Sharp 05/20/22 in Epic  Chest x-ray - CT 06/10/22 Epic EKG - 09/12/21 Epic Stress Test - 02/10/18 Epic ECHO - 09/19/21 Epic Cardiac Cath - 04/06/20 Epic Pacemaker/ICD device last checked: n/a Spinal Cord Stimulator: n/a  Bowel Prep - no  Sleep Study - n/a CPAP -   Fasting Blood Sugar - 120-140 Checks Blood Sugar 2-3 times per week  Blood Thinner Instructions: Xarelto, hold 3 days Aspirin Instructions: Last Dose: 06/21/22 2000  Activity level: Can go up a flight of stairs and perform activities of daily living without stopping and without symptoms of chest pain or shortness of breath.    Anesthesia review: CABG, a fib, DM2, COPD, HTN  Patient denies shortness of breath, fever, cough and chest pain at PAT appointment  Patient verbalized understanding of instructions that were given to them at the PAT appointment. Patient was also instructed that they will need to review over the PAT instructions again at home before surgery.

## 2022-06-18 NOTE — Progress Notes (Signed)
Subjective:   Patient ID: David Weaver, male   DOB: 71 y.o.   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  Sterile prep injected around the joint surface 3 mg Dexasone Kenalog 5 mg Xylocaine advised on reduced activity

## 2022-06-19 ENCOUNTER — Encounter (HOSPITAL_COMMUNITY)
Admission: RE | Admit: 2022-06-19 | Discharge: 2022-06-19 | Disposition: A | Discharge status: Home / Self Care | Payer: Medicare Other | Source: Ambulatory Visit | Attending: Orthopaedic Surgery | Admitting: Orthopaedic Surgery

## 2022-06-19 ENCOUNTER — Encounter (HOSPITAL_COMMUNITY): Payer: Self-pay

## 2022-06-19 VITALS — BP 138/82 | HR 71 | Temp 98.6°F | Resp 12 | Ht 66.0 in | Wt 185.6 lb

## 2022-06-19 DIAGNOSIS — Z01818 Encounter for other preprocedural examination: Secondary | ICD-10-CM | POA: Diagnosis not present

## 2022-06-19 DIAGNOSIS — E119 Type 2 diabetes mellitus without complications: Secondary | ICD-10-CM | POA: Diagnosis not present

## 2022-06-19 LAB — HEMOGLOBIN A1C
Hgb A1c MFr Bld: 6.5 % — ABNORMAL HIGH (ref 4.8–5.6)
Mean Plasma Glucose: 139.85 mg/dL

## 2022-06-19 LAB — CBC
HCT: 41.5 % (ref 39.0–52.0)
Hemoglobin: 13.9 g/dL (ref 13.0–17.0)
MCH: 31.4 pg (ref 26.0–34.0)
MCHC: 33.5 g/dL (ref 30.0–36.0)
MCV: 93.9 fL (ref 80.0–100.0)
Platelets: 244 10*3/uL (ref 150–400)
RBC: 4.42 MIL/uL (ref 4.22–5.81)
RDW: 14.1 % (ref 11.5–15.5)
WBC: 6.8 10*3/uL (ref 4.0–10.5)
nRBC: 0 % (ref 0.0–0.2)

## 2022-06-19 LAB — GLUCOSE, CAPILLARY: Glucose-Capillary: 158 mg/dL — ABNORMAL HIGH (ref 70–99)

## 2022-06-19 LAB — BASIC METABOLIC PANEL
Anion gap: 9 (ref 5–15)
BUN: 17 mg/dL (ref 8–23)
CO2: 22 mmol/L (ref 22–32)
Calcium: 8.8 mg/dL — ABNORMAL LOW (ref 8.9–10.3)
Chloride: 108 mmol/L (ref 98–111)
Creatinine, Ser: 0.85 mg/dL (ref 0.61–1.24)
GFR, Estimated: >60 mL/min (ref >60)
Glucose, Bld: 138 mg/dL — ABNORMAL HIGH (ref 70–99)
Potassium: 4.1 mmol/L (ref 3.5–5.1)
Sodium: 139 mmol/L (ref 135–145)

## 2022-06-19 LAB — SURGICAL PCR SCREEN
MRSA, PCR: NEGATIVE
Staphylococcus aureus: NEGATIVE

## 2022-06-20 NOTE — Progress Notes (Signed)
Anesthesia Chart Review   Case: [id number] Date/Time: 06/25/22 0945   Procedure: REVERSE SHOULDER ARTHROPLASTY (Left: Shoulder)   Anesthesia type: General   Pre-op diagnosis: OA LEFT SHOULDER   Location: [facility] ROOM 06 / [facility]   Surgeons: Hiram Gash, MD       DISCUSSION:71 y.o. former smoker with h/o HTN, Lupus erythematous, fibromyalgia, PAF, CAD (LAD PCI 03/2020), left shoulder OA scheduled for above procedure 06/25/2022 with Dr. Ophelia Charter.   Pt last seen by cardiology 05/20/2022. Per OV note, "Preoperative Cardiovascular Risk Assessment:   He has a history of CAD with prior stenting.  He can complete more than 4.0 METS without angina.  According to the RCRI, he has a 0.9% risk of Mace during the perioperative period.  He understands his risk and wishes to proceed.   Therefore, based on ACC/AHA guidelines, the patient would be at acceptable risk for the planned procedure without further cardiovascular testing. "  Pt advised to hold Xarelto 3 days prior to surgery.   Anticipate pt can proceed with planned procedure barring acute status change.   VS: BP 138/82   Pulse 71   Temp 37 C (Oral)   Resp 12   Ht '5\' 6"'$  (1.676 m)   Wt 84.2 kg   SpO2 96%   BMI 29.96 kg/m   PROVIDERS: Wardell Honour, MD is PCP   Cardiologist - Steele Sizer, MD LABS: Labs reviewed: Acceptable for surgery. (all labs ordered are listed, but only abnormal results are displayed)  Labs Reviewed  HEMOGLOBIN A1C - Abnormal; Notable for the following components:      Result Value   Hgb A1c MFr Bld 6.5 (*)    All other components within normal limits  BASIC METABOLIC PANEL - Abnormal; Notable for the following components:   Glucose, Bld 138 (*)    Calcium 8.8 (*)    All other components within normal limits  GLUCOSE, CAPILLARY - Abnormal; Notable for the following components:   Glucose-Capillary 158 (*)    All other components within normal limits  SURGICAL PCR SCREEN  CBC      IMAGES:   EKG: 09/12/2021 Rate 63 bpm  NSR  CV: Echo 09/19/2021  1. Left ventricular ejection fraction, by estimation, is 55%. Left  ventricular ejection fraction by 2D MOD biplane is 59.4 %. The left  ventricle has normal function. The left ventricle has no regional wall  motion abnormalities. Left ventricular  diastolic parameters are consistent with Grade I diastolic dysfunction  (impaired relaxation).   2. Right ventricular systolic function is normal. The right ventricular  size is normal.   3. Left atrial size was moderately dilated.   4. The mitral valve is grossly normal. No evidence of mitral valve  regurgitation. No evidence of mitral stenosis. Moderate mitral annular  calcification.   5. The aortic valve is tricuspid. Aortic valve regurgitation is trivial.  Mild aortic valve sclerosis is present, with no evidence of aortic valve  stenosis.   Myocardial Perfusion 02/10/2018 The left ventricular ejection fraction is mildly decreased (45-54%). Nuclear stress EF: 46%. Mild generalized hypokinesis There was no ST segment deviation noted during stress. The study is normal. This is a low risk study. No ischemia. Prior ECHO 2016 - normal EF. Past Medical History:  Diagnosis Date   Alcohol abuse, daily use    Allergic rhinitis, cause unspecified    Angina pectoris ([place]) 03/26/2020   Atypical mole 10/19/2019   Right Cheek Mindi Slicker)   Dysthymic disorder  Esophageal reflux    Essential hypertension, benign    Fibromyalgia    followed by [name] every six months.   Fibromyositis    Hypertriglyceridemia 07/20/2020   Impotence of organic origin    Insomnia, unspecified    Left knee DJD    Lupus erythematosus    Osteoarthrosis, unspecified whether generalized or localized, lower leg    Other abnormal glucose    Other specified disorder of male genital organs(608.89)    Pure hypercholesterolemia 07/20/2020   S/P coronary artery stent placement 07/20/2020    LAD PCI 03/2020   Systemic lupus erythematosus ([place])    Unspecified hearing loss    Unspecified vitamin D deficiency     Past Surgical History:  Procedure Laterality Date   CARPAL TUNNEL RELEASE     bilateral   CHOLECYSTECTOMY     COLONOSCOPY  03/01/2014   two polyps; Dr. Benson Norway.  Repeat 5 years.   CORONARY STENT INTERVENTION N/A 04/06/2020   Procedure: CORONARY STENT INTERVENTION;  Surgeon: Wellington Hampshire, MD;  Location: [facility];  Service: Cardiovascular;  Laterality: N/A;   GANGLION CYST EXCISION  1980   Left wrist   JOINT REPLACEMENT  01/2013   right knee, left knee. [place].   KNEE ARTHROPLASTY     KNEE ARTHROSCOPY  2008   Right   LEFT HEART CATH AND CORONARY ANGIOGRAPHY N/A 04/06/2020   Procedure: LEFT HEART CATH AND CORONARY ANGIOGRAPHY;  Surgeon: Wellington Hampshire, MD;  Location: [facility];  Service: Cardiovascular;  Laterality: N/A;   lumbar spine epidural  2009   [facility]   ROTATOR CUFF REPAIR  10/2008   right   TOTAL KNEE ARTHROPLASTY  12/27/2012   Procedure: TOTAL KNEE ARTHROPLASTY;  Surgeon: [name], MD;  Location: [facility];  Service: Orthopedics;  Laterality: Right;  RIGHT ARTHROPLASTY KNEE MEDIAL AND LATERAL COMPARTMENTS WITH PATELLA RESURFACING, RIGHT TOTAL KNEE REPLACEMENT   TOTAL KNEE ARTHROPLASTY Left 01/31/2013   Procedure: TOTAL KNEE ARTHROPLASTY;  Surgeon: [name], MD;  Location: [facility];  Service: Orthopedics;  Laterality: Left;   TOTAL SHOULDER ARTHROPLASTY      MEDICATIONS:  amLODipine (NORVASC) 5 MG tablet   atorvastatin (LIPITOR) 40 MG tablet   Camphor-Menthol-Methyl Sal (SALONPAS) 3.12-06-08 % PTCH   carboxymethylcellulose (REFRESH PLUS) 0.5 % SOLN   chlorpheniramine (CHLOR-TRIMETON) 4 MG tablet   Cholecalciferol (DIALYVITE VITAMIN D 5000) 125 MCG (5000 UT) capsule   colestipol (COLESTID) 1 g tablet   DULoxetine (CYMBALTA) 60 MG capsule   folic acid (FOLVITE) 1 MG tablet   hydroxychloroquine (PLAQUENIL) 200 MG  tablet   losartan (COZAAR) 100 MG tablet   metFORMIN (GLUCOPHAGE) 500 MG tablet   methocarbamol (ROBAXIN) 500 MG tablet   methotrexate (RHEUMATREX) 2.5 MG tablet   metoprolol tartrate (LOPRESSOR) 50 MG tablet   mirtazapine (REMERON) 15 MG tablet   ondansetron (ZOFRAN) 4 MG tablet   ONETOUCH ULTRA test strip   OVER THE COUNTER MEDICATION   oxyCODONE (OXY IR/ROXICODONE) 5 MG immediate release tablet   pantoprazole (PROTONIX) 40 MG tablet   predniSONE (DELTASONE) 1 MG tablet   rivaroxaban (XARELTO) 20 MG TABS tablet   No current facility-administered medications for this encounter.     Konrad Felix Ward, PA-C [facility]-Surgical Testing [phone]

## 2022-06-20 NOTE — Anesthesia Preprocedure Evaluation (Signed)
Anesthesia Evaluation  Patient identified by MRN, date of birth, ID band Patient awake    Reviewed: Allergy & Precautions, NPO status , Patient's Chart, lab work & pertinent test results, reviewed documented beta blocker date and time   Airway Mallampati: II  TM Distance: >3 FB Neck ROM: Full    Dental  (+) Partial Lower, Partial Upper   Pulmonary COPD,  COPD inhaler, former smoker,    breath sounds clear to auscultation + decreased breath sounds      Cardiovascular hypertension, Pt. on medications and Pt. on home beta blockers + angina + CAD, + Cardiac Stents and + DOE  Normal cardiovascular exam Rhythm:Regular Rate:Normal  PCI and stent placement LAD 2021  EKG 09/12/21 NSR, normal  Echo 09/19/21 1. Left ventricular ejection fraction, by estimation, is 55%. Left ventricular ejection fraction by 2D MOD biplane is 59.4 %. The left ventricle has normal function. The left ventricle has no regional wall motion abnormalities. Left ventricular diastolic parameters are consistent with Grade I diastolic dysfunction (impaired relaxation).  2. Right ventricular systolic function is normal. The right ventricular size is normal.  3. Left atrial size was moderately dilated.  4. The mitral valve is grossly normal. No evidence of mitral valve regurgitation. No evidence of mitral stenosis. Moderate mitral annular calcification.  5. The aortic valve is tricuspid. Aortic valve regurgitation is trivial. Mild aortic valve sclerosis is present, with no evidence of aortic valve stenosis.   Cardiac Cath 04/06/20 ? The left ventricular systolic function is normal. ? LV end diastolic pressure is normal. ? The left ventricular ejection fraction is 55-65% by visual estimate. ? Mid LAD lesion is 80% stenosed. ? Post intervention, there is a 0% residual stenosis. ? A drug-eluting stent was successfully placed using a STENT RESOLUTE ONYX 3.0X34. ? Ost LAD  lesion is 30% stenosed.   1.  Severe one-vessel coronary artery disease involving mid LAD with 80% moderately calcified eccentric stenosis.  No other obstructive disease. 2.  Normal LV systolic function and left ventricular end-diastolic pressure. 3.  Successful angioplasty and drug-eluting stent placement to the mid LAD.  PCI indication is class III angina on maximal medical therapy and intermediate risk noninvasive testing.     Neuro/Psych PSYCHIATRIC DISORDERS Anxiety Depression Dysthymic disorder Neuromuscular disease    GI/Hepatic GERD  Medicated and Controlled,(+)     substance abuse  alcohol use,   Endo/Other  diabetes, Well Controlled, Type 2, Oral Hypoglycemic AgentsHypercholesterolemia  Renal/GU negative Renal ROS  negative genitourinary   Musculoskeletal  (+) Arthritis , Osteoarthritis,  Fibromyalgia -OA left shoulder S/P Bilateral TKR Hx/o Fibromyositis   Abdominal   Peds  Hematology SLE Xarelto therapy- last dose 7/23   Anesthesia Other Findings   Reproductive/Obstetrics                           Anesthesia Physical Anesthesia Plan  ASA: 3  Anesthesia Plan: General   Post-op Pain Management: Regional block* and Minimal or no pain anticipated   Induction: Intravenous  PONV Risk Score and Plan: 3 and Treatment may vary due to age or medical condition and Ondansetron  Airway Management Planned: Oral ETT  Additional Equipment: None  Intra-op Plan:   Post-operative Plan: Extubation in OR  Informed Consent: I have reviewed the patients History and Physical, chart, labs and discussed the procedure including the risks, benefits and alternatives for the proposed anesthesia with the patient or authorized representative who has indicated his/her understanding  and acceptance.     Dental advisory given  Plan Discussed with: CRNA and Anesthesiologist  Anesthesia Plan Comments: (See PAT note 06/19/2022)       Anesthesia Quick  Evaluation

## 2022-06-23 NOTE — H&P (Signed)
PREOPERATIVE H&P  Chief Complaint: OA LEFT SHOULDER  HPI: David Weaver is a 71 y.o. male who is scheduled for Procedure(s): REVERSE SHOULDER ARTHROPLASTY.   Patient has a past medical history significant for HTN, fibromyalgia, s/p coronary artery stent, SLE.   The patient has had shoulder pain for quite some time.  He has not gotten much relief with injections. He feels the pain is worsening with time. He is frustrated by the function of his shoulder. He uses oxycodone 5 mg 1-2 times a day from his pain management doctor.  He does not feel like they are helping much.  He had a stent with a cardiologist about a year ago.    Symptoms are rated as moderate to severe, and have been worsening.  This is significantly impairing activities of daily living.    Please see clinic note for further details on this patient's care.    He has elected for surgical management.   Past Medical History:  Diagnosis Date   Alcohol abuse, daily use    Allergic rhinitis, cause unspecified    Angina pectoris ([place]) 03/26/2020   Atypical mole 10/19/2019   Right Cheek Mindi Slicker)   Dysthymic disorder    Esophageal reflux    Essential hypertension, benign    Fibromyalgia    followed by [name] every six months.   Fibromyositis    Hypertriglyceridemia 07/20/2020   Impotence of organic origin    Insomnia, unspecified    Left knee DJD    Lupus erythematosus    Osteoarthrosis, unspecified whether generalized or localized, lower leg    Other abnormal glucose    Other specified disorder of male genital organs(608.89)    Pure hypercholesterolemia 07/20/2020   S/P coronary artery stent placement 07/20/2020   LAD PCI 03/2020   Systemic lupus erythematosus ([place])    Unspecified hearing loss    Unspecified vitamin D deficiency    Past Surgical History:  Procedure Laterality Date   CARPAL TUNNEL RELEASE     bilateral   CHOLECYSTECTOMY     COLONOSCOPY  03/01/2014   two polyps; Dr. Benson Norway.  Repeat 5 years.    CORONARY STENT INTERVENTION N/A 04/06/2020   Procedure: CORONARY STENT INTERVENTION;  Surgeon: Wellington Hampshire, MD;  Location: [facility];  Service: Cardiovascular;  Laterality: N/A;   GANGLION CYST EXCISION  1980   Left wrist   JOINT REPLACEMENT  01/2013   right knee, left knee. [place].   KNEE ARTHROPLASTY     KNEE ARTHROSCOPY  2008   Right   LEFT HEART CATH AND CORONARY ANGIOGRAPHY N/A 04/06/2020   Procedure: LEFT HEART CATH AND CORONARY ANGIOGRAPHY;  Surgeon: Wellington Hampshire, MD;  Location: [facility];  Service: Cardiovascular;  Laterality: N/A;   lumbar spine epidural  2009   [facility]   ROTATOR CUFF REPAIR  10/2008   right   TOTAL KNEE ARTHROPLASTY  12/27/2012   Procedure: TOTAL KNEE ARTHROPLASTY;  Surgeon: [name], MD;  Location: [facility];  Service: Orthopedics;  Laterality: Right;  RIGHT ARTHROPLASTY KNEE MEDIAL AND LATERAL COMPARTMENTS WITH PATELLA RESURFACING, RIGHT TOTAL KNEE REPLACEMENT   TOTAL KNEE ARTHROPLASTY Left 01/31/2013   Procedure: TOTAL KNEE ARTHROPLASTY;  Surgeon: [name], MD;  Location: [facility];  Service: Orthopedics;  Laterality: Left;   TOTAL SHOULDER ARTHROPLASTY     Social History   Socioeconomic History   Marital status: Married    Spouse name: Not on file   Number  of children: 1   Years of education: Not on file   Highest education level: Not on file  Occupational History   Occupation: [employer]    Employer: [employer]  Tobacco Use   Smoking status: Former    Packs/day: 1.50    Years: 50.00    Total pack years: 75.00    Types: Cigarettes    Quit date: 2016    Years since quitting: 7.5   Smokeless tobacco: Never  Vaping Use   Vaping Use: Never used  Substance and Sexual Activity   Alcohol use: Yes    Alcohol/week: 14.0 - 21.0 standard drinks of alcohol    Types: 14 - 21 Cans of beer per week    Comment: 2-3 beer per night   Drug use: Not Currently   Sexual activity:  Yes  Other Topics Concern   Not on file  Social History Narrative   Guns in the home stored in locked cabinet.       Caffeine use: 1 serving/ day.     Marital status:  Married x 25 years, third marriage, happily married.      Children: one son (43); one step-daughter; two grandsons      Lives: with wife. Mother-in-law going to move in in 2017.      Employment:  Works at [employer] on [employer]; [employer] work; happy; 25 hours per week.      Tobacco: none currently; quit 2013.  Smoked x 40 years.      Alcohol:  2-6 beers per night in 2018; much heavier use in fifties.      Drugs: none      Exercise:  None in 2017.      Seatbelt: 100%      ADLs: independent with ADLs in 2017; no assistant devices      Advanced Directives: none in 2017; FULL CODE in 2017; no prolonged measures   Social Determinants of Health   Financial Resource Strain: Low Risk  (09/12/2021)   Overall Financial Resource Strain (CARDIA)    Difficulty of Paying Living Expenses: Not hard at all  Food Insecurity: No Food Insecurity (09/12/2021)   Hunger Vital Sign    Worried About Running Out of Food in the Last Year: Never true    Ran Out of Food in the Last Year: Never true  Transportation Needs: No Transportation Needs (09/12/2021)   PRAPARE - Hydrologist (Medical): No    Lack of Transportation (Non-Medical): No  Physical Activity: Inactive (09/12/2021)   Exercise Vital Sign    Days of Exercise per Week: 0 days    Minutes of Exercise per Session: 0 min  Stress: Not on file  Social Connections: Not on file   Family History  Problem Relation Age of Onset   Alzheimer's disease Mother    Osteoporosis Mother    Heart disease Father 35       AMI   Alcohol abuse Father    Cancer Brother        colon cancer   Diabetes Brother    Kidney failure Brother    No Known Allergies Prior to Admission medications   Medication Sig Start Date End Date Taking? Authorizing Provider  amLODipine  (NORVASC) 5 MG tablet Take 5 mg by mouth daily.   Yes [provider]  atorvastatin (LIPITOR) 40 MG tablet Take 1 tablet (40 mg total) by mouth daily. 04/27/20  Yes Almyra Deforest,  PA  Camphor-Menthol-Methyl Sal (SALONPAS) 3.12-06-08 % PTCH Place 1 patch onto the skin daily as needed (pain).   Yes [provider]  carboxymethylcellulose (REFRESH PLUS) 0.5 % SOLN Place 1 drop into both eyes 3 (three) times daily as needed (dry eyes).   Yes [provider]  chlorpheniramine (CHLOR-TRIMETON) 4 MG tablet Take 4 mg by mouth 2 (two) times daily as needed for allergies.   Yes [provider]  Cholecalciferol (DIALYVITE VITAMIN D 5000) 125 MCG (5000 UT) capsule Take 5,000 Units by mouth daily.   Yes [provider]  colestipol (COLESTID) 1 g tablet Take 1 g by mouth 2 (two) times daily. 03/05/18  Yes [provider]  DULoxetine (CYMBALTA) 60 MG capsule Take 1 capsule (60 mg total) 2 (two) times daily by mouth. 10/13/17  Yes Wardell Honour, MD  folic acid (FOLVITE) 1 MG tablet Take 1 mg by mouth daily. 07/02/21  Yes [provider]  hydroxychloroquine (PLAQUENIL) 200 MG tablet TAKE 1 TABLET BY MOUTH TWICE A DAY 01/07/21  Yes Ofilia Neas, PA-C  losartan (COZAAR) 100 MG tablet Take 1 tablet (100 mg total) by mouth daily. 09/12/21  Yes Skeet Latch, MD  metFORMIN (GLUCOPHAGE) 500 MG tablet Take 500 mg by mouth daily. 09/22/20 06/13/22 Yes [provider]  methocarbamol (ROBAXIN) 500 MG tablet Take 500 mg by mouth every 8 (eight) hours as needed for muscle spasms.   Yes [provider]  methotrexate (RHEUMATREX) 2.5 MG tablet Take 10 mg by mouth once a week. Caution:Chemotherapy. Protect from light.   Yes [provider]  metoprolol tartrate (LOPRESSOR) 50 MG tablet Take 50 mg by mouth 2 (two) times daily.   Yes [provider]  mirtazapine (REMERON) 15 MG tablet Take 15 mg by mouth at bedtime.  04/14/19  Yes [provider]  ondansetron (ZOFRAN) 4 MG tablet TAKE 1 TABLET BY MOUTH EVERY 8 HOURS AS NEEDED FOR NAUSEA AND VOMITING 03/21/19  Yes Sagardia, Ines Bloomer, MD  OVER THE COUNTER MEDICATION Apply 1 Application topically daily as needed (leg pain). CBD Cream   Yes [provider]  oxyCODONE (OXY IR/ROXICODONE) 5 MG immediate release tablet Take 10 mg by mouth every 6 (six) hours as needed (pain).  02/02/20  Yes [provider]  pantoprazole (PROTONIX) 40 MG tablet TAKE 1 TABLET BY MOUTH DAILY 07/24/20  Yes Skeet Latch, MD  predniSONE (DELTASONE) 1 MG tablet Take 3 mg by mouth daily. 08/07/20  Yes [provider]  rivaroxaban (XARELTO) 20 MG TABS tablet Take 1 tablet (20 mg total) by mouth daily with supper. 06/08/20  Yes Cleaver, Jossie Ng, NP  [facility] ULTRA test strip 1 each daily. 09/22/20   [provider]    ROS: All other systems have been reviewed and were otherwise negative with the exception of those mentioned in the HPI and as above.  Physical Exam: General: Alert, no acute distress Cardiovascular: No pedal edema Respiratory: No cyanosis, no use of accessory musculature GI: No organomegaly, abdomen is soft and non-tender Skin: No lesions in the area of chief complaint Neurologic: Sensation intact distally Psychiatric: Patient is competent for consent with normal mood and affect Lymphatic: No axillary or cervical lymphadenopathy  MUSCULOSKELETAL:  Range of motion of the left shoulder to 90, passive to 120.  External rotation to 15, internal rotation to the front pocket.  Cuff strength is weak throughout.  Active forward elevation to 130-140.  Minimal pain.    Imaging: MRI of  the left shoulder demonstrates cuff tear arthropathy [place] 4B.    BMI: Estimated body mass index is 29.96 kg/m as calculated from the following:   Height as of 06/19/22: '5\' 6"'$  (1.676 m).   Weight as of 06/19/22: 84.2 kg.  Lab Results  Component Value Date   ALBUMIN 4.2  07/30/2020   Diabetes:   Patient has a diagnosis of diabetes,  Lab Results  Component Value Date   HGBA1C 6.5 (H) 06/19/2022   Smoking Status: Social History   Tobacco Use  Smoking Status Former   Packs/day: 1.50   Years: 50.00   Total pack years: 75.00   Types: Cigarettes   Quit date: 2016   Years since quitting: 7.5  Smokeless Tobacco Never   The patient is not currently a tobacco user. Counseling given: Not Answered      Assessment: OA LEFT SHOULDER  Plan: Plan for Procedure(s): REVERSE SHOULDER ARTHROPLASTY  The risks benefits and alternatives were discussed with the patient including but not limited to the risks of nonoperative treatment, versus surgical intervention including infection, bleeding, nerve injury,  blood clots, cardiopulmonary complications, morbidity, mortality, among others, and they were willing to proceed.   We additionally specifically discussed risks of axillary nerve injury, infection, periprosthetic fracture, continued pain and longevity of implants prior to beginning procedure.   We talked about specific risks, including, but not limited to dislocation, fracture, stress fracture, internal rotation deficit.  All questions were answered.  We will work on surgical scheduling for the left side.     Patient will be closely monitored in PACU for medical stabilization and pain control. If found stable in PACU, patient may be discharged home with outpatient follow-up. If any concerns regarding patient's stabilization patient will be admitted for observation after surgery. The patient is planning to be discharged home with outpatient PT.   The patient acknowledged the explanation, agreed to proceed with the plan and consent was signed.   He received operative clearance from his PCP, Dr. Reginia Forts, his cardiologist, Dr. Skeet Latch, and pain management provider, Dr. Nicholaus Bloom.   Operative Plan: Left reverse total shoulder arthroplasty   Discharge Medications: Currently taking oxycodone '5mg'$  (180 tablets per month). Toradol, Robaxin, Gabapentin, tylenol, zofran DVT Prophylaxis: Resume Xarelto Physical Therapy: Outpatient PT Special Discharge needs: Sling, Jeneen Montgomery, PA-C  06/23/2022 8:25 AM

## 2022-06-25 ENCOUNTER — Other Ambulatory Visit: Payer: Self-pay

## 2022-06-25 ENCOUNTER — Encounter (HOSPITAL_COMMUNITY): Payer: Self-pay | Admitting: Orthopaedic Surgery

## 2022-06-25 ENCOUNTER — Ambulatory Visit (HOSPITAL_COMMUNITY): Payer: Medicare Other | Admitting: Physician Assistant

## 2022-06-25 ENCOUNTER — Encounter (HOSPITAL_COMMUNITY)
Admission: RE | Disposition: A | Discharge status: Home / Self Care | Payer: Self-pay | Source: Ambulatory Visit | Attending: Orthopaedic Surgery

## 2022-06-25 ENCOUNTER — Ambulatory Visit (HOSPITAL_COMMUNITY)
Admission: RE | Admit: 2022-06-25 | Discharge: 2022-06-25 | Disposition: A | Discharge status: Home / Self Care | Payer: Medicare Other | Source: Ambulatory Visit | Attending: Orthopaedic Surgery | Admitting: Orthopaedic Surgery

## 2022-06-25 ENCOUNTER — Ambulatory Visit (HOSPITAL_BASED_OUTPATIENT_CLINIC_OR_DEPARTMENT_OTHER): Payer: Medicare Other | Admitting: Anesthesiology

## 2022-06-25 ENCOUNTER — Ambulatory Visit (HOSPITAL_COMMUNITY): Payer: Medicare Other

## 2022-06-25 DIAGNOSIS — Z7984 Long term (current) use of oral hypoglycemic drugs: Secondary | ICD-10-CM | POA: Diagnosis not present

## 2022-06-25 DIAGNOSIS — E119 Type 2 diabetes mellitus without complications: Secondary | ICD-10-CM | POA: Insufficient documentation

## 2022-06-25 DIAGNOSIS — E7800 Pure hypercholesterolemia, unspecified: Secondary | ICD-10-CM | POA: Diagnosis not present

## 2022-06-25 DIAGNOSIS — M19012 Primary osteoarthritis, left shoulder: Secondary | ICD-10-CM | POA: Diagnosis present

## 2022-06-25 DIAGNOSIS — J449 Chronic obstructive pulmonary disease, unspecified: Secondary | ICD-10-CM | POA: Insufficient documentation

## 2022-06-25 DIAGNOSIS — K219 Gastro-esophageal reflux disease without esophagitis: Secondary | ICD-10-CM | POA: Insufficient documentation

## 2022-06-25 DIAGNOSIS — Z87891 Personal history of nicotine dependence: Secondary | ICD-10-CM | POA: Diagnosis not present

## 2022-06-25 DIAGNOSIS — Z79899 Other long term (current) drug therapy: Secondary | ICD-10-CM | POA: Insufficient documentation

## 2022-06-25 DIAGNOSIS — M75102 Unspecified rotator cuff tear or rupture of left shoulder, not specified as traumatic: Secondary | ICD-10-CM

## 2022-06-25 DIAGNOSIS — M19011 Primary osteoarthritis, right shoulder: Secondary | ICD-10-CM | POA: Diagnosis not present

## 2022-06-25 DIAGNOSIS — I25119 Atherosclerotic heart disease of native coronary artery with unspecified angina pectoris: Secondary | ICD-10-CM | POA: Diagnosis not present

## 2022-06-25 DIAGNOSIS — I10 Essential (primary) hypertension: Secondary | ICD-10-CM

## 2022-06-25 DIAGNOSIS — F32A Depression, unspecified: Secondary | ICD-10-CM | POA: Insufficient documentation

## 2022-06-25 DIAGNOSIS — M329 Systemic lupus erythematosus, unspecified: Secondary | ICD-10-CM | POA: Insufficient documentation

## 2022-06-25 DIAGNOSIS — Z7901 Long term (current) use of anticoagulants: Secondary | ICD-10-CM | POA: Diagnosis not present

## 2022-06-25 DIAGNOSIS — Z96653 Presence of artificial knee joint, bilateral: Secondary | ICD-10-CM | POA: Diagnosis not present

## 2022-06-25 DIAGNOSIS — Z955 Presence of coronary angioplasty implant and graft: Secondary | ICD-10-CM | POA: Diagnosis not present

## 2022-06-25 DIAGNOSIS — F419 Anxiety disorder, unspecified: Secondary | ICD-10-CM | POA: Insufficient documentation

## 2022-06-25 DIAGNOSIS — M797 Fibromyalgia: Secondary | ICD-10-CM | POA: Diagnosis not present

## 2022-06-25 HISTORY — PX: REVERSE SHOULDER ARTHROPLASTY: SHX5054

## 2022-06-25 LAB — GLUCOSE, CAPILLARY
Glucose-Capillary: 124 mg/dL — ABNORMAL HIGH (ref 70–99)
Glucose-Capillary: 133 mg/dL — ABNORMAL HIGH (ref 70–99)

## 2022-06-25 SURGERY — ARTHROPLASTY, SHOULDER, TOTAL, REVERSE
Anesthesia: General | Site: Shoulder | Laterality: Left

## 2022-06-25 MED ORDER — LIDOCAINE HCL (PF) 2 % IJ SOLN
INTRAMUSCULAR | Status: AC
  Filled 2022-06-25: qty 5

## 2022-06-25 MED ORDER — DEXAMETHASONE SODIUM PHOSPHATE 10 MG/ML IJ SOLN
INTRAMUSCULAR | Status: AC
  Filled 2022-06-25: qty 1

## 2022-06-25 MED ORDER — METHOCARBAMOL 500 MG PO TABS: 500.0000 mg | ORAL_TABLET | Freq: Three times a day (TID) | ORAL | 0 refills | Status: AC | PRN

## 2022-06-25 MED ORDER — ACETAMINOPHEN 500 MG PO TABS
1000.0000 mg | ORAL_TABLET | Freq: Once | ORAL | Status: AC
  Administered 2022-06-25: 1000 mg via ORAL
  Filled 2022-06-25: qty 2

## 2022-06-25 MED ORDER — ONDANSETRON HCL 4 MG/2ML IJ SOLN
INTRAMUSCULAR | Status: AC
  Filled 2022-06-25: qty 2

## 2022-06-25 MED ORDER — OXYCODONE HCL 5 MG PO TABS: ORAL_TABLET | ORAL | 0 refills | Status: AC

## 2022-06-25 MED ORDER — ACETAMINOPHEN 500 MG PO TABS: 1000.0000 mg | ORAL_TABLET | Freq: Three times a day (TID) | ORAL | 0 refills | Status: AC

## 2022-06-25 MED ORDER — TRANEXAMIC ACID-NACL 1000-0.7 MG/100ML-% IV SOLN
1000.0000 mg | INTRAVENOUS | Status: AC
Start: 2022-06-25 — End: 2022-06-25
  Administered 2022-06-25: 1000 mg via INTRAVENOUS
  Filled 2022-06-25: qty 100

## 2022-06-25 MED ORDER — OXYCODONE HCL 5 MG PO TABS: 5.0000 mg | ORAL_TABLET | Freq: Once | ORAL | Status: DC | PRN

## 2022-06-25 MED ORDER — HYDROMORPHONE HCL 1 MG/ML IJ SOLN: 0.2500 mg | INTRAMUSCULAR | Status: DC | PRN

## 2022-06-25 MED ORDER — FENTANYL CITRATE PF 50 MCG/ML IJ SOSY
50.0000 ug | PREFILLED_SYRINGE | INTRAMUSCULAR | Status: DC
  Administered 2022-06-25: 50 ug via INTRAVENOUS

## 2022-06-25 MED ORDER — ORAL CARE MOUTH RINSE: 15.0000 mL | Freq: Once | OROMUCOSAL | Status: AC

## 2022-06-25 MED ORDER — VANCOMYCIN HCL 1 G IV SOLR
INTRAVENOUS | Status: DC | PRN
  Administered 2022-06-25: 1000 mg

## 2022-06-25 MED ORDER — SUGAMMADEX SODIUM 200 MG/2ML IV SOLN
INTRAVENOUS | Status: DC | PRN
  Administered 2022-06-25: 200 mg via INTRAVENOUS

## 2022-06-25 MED ORDER — LIDOCAINE HCL (CARDIAC) PF 100 MG/5ML IV SOSY
PREFILLED_SYRINGE | INTRAVENOUS | Status: DC | PRN
  Administered 2022-06-25: 60 mg via INTRAVENOUS

## 2022-06-25 MED ORDER — EPHEDRINE SULFATE-NACL 50-0.9 MG/10ML-% IV SOSY
PREFILLED_SYRINGE | INTRAVENOUS | Status: DC | PRN
  Administered 2022-06-25 (×2): 10 mg via INTRAVENOUS
  Administered 2022-06-25: 15 mg via INTRAVENOUS

## 2022-06-25 MED ORDER — GABAPENTIN 100 MG PO CAPS: 100.0000 mg | ORAL_CAPSULE | Freq: Three times a day (TID) | ORAL | 0 refills | Status: AC

## 2022-06-25 MED ORDER — BUPIVACAINE HCL (PF) 0.5 % IJ SOLN
INTRAMUSCULAR | Status: DC | PRN
  Administered 2022-06-25: 20 mL via PERINEURAL

## 2022-06-25 MED ORDER — LACTATED RINGERS IV BOLUS
500.0000 mL | Freq: Once | INTRAVENOUS | Status: AC
  Administered 2022-06-25: 500 mL via INTRAVENOUS

## 2022-06-25 MED ORDER — SODIUM CHLORIDE 0.9 % IR SOLN
Status: DC | PRN
  Administered 2022-06-25: 1000 mL

## 2022-06-25 MED ORDER — DEXAMETHASONE SODIUM PHOSPHATE 10 MG/ML IJ SOLN
INTRAMUSCULAR | Status: DC | PRN
  Administered 2022-06-25: 5 mg via INTRAVENOUS

## 2022-06-25 MED ORDER — PHENYLEPHRINE HCL-NACL 20-0.9 MG/250ML-% IV SOLN
INTRAVENOUS | Status: AC
  Filled 2022-06-25: qty 250

## 2022-06-25 MED ORDER — PROPOFOL 10 MG/ML IV BOLUS
INTRAVENOUS | Status: AC
  Filled 2022-06-25: qty 20

## 2022-06-25 MED ORDER — ONDANSETRON HCL 4 MG PO TABS: 4.0000 mg | ORAL_TABLET | Freq: Three times a day (TID) | ORAL | 0 refills | Status: AC | PRN

## 2022-06-25 MED ORDER — OXYCODONE HCL 5 MG/5ML PO SOLN: 5.0000 mg | Freq: Once | ORAL | Status: DC | PRN

## 2022-06-25 MED ORDER — ONDANSETRON HCL 4 MG/2ML IJ SOLN: 4.0000 mg | Freq: Once | INTRAMUSCULAR | Status: DC | PRN

## 2022-06-25 MED ORDER — MIDAZOLAM HCL 2 MG/2ML IJ SOLN
2.0000 mg | INTRAMUSCULAR | Status: DC
  Filled 2022-06-25: qty 2

## 2022-06-25 MED ORDER — FENTANYL CITRATE PF 50 MCG/ML IJ SOSY
100.0000 ug | PREFILLED_SYRINGE | INTRAMUSCULAR | Status: DC
  Filled 2022-06-25: qty 2

## 2022-06-25 MED ORDER — 0.9 % SODIUM CHLORIDE (POUR BTL) OPTIME
TOPICAL | Status: DC | PRN
  Administered 2022-06-25: 1000 mL

## 2022-06-25 MED ORDER — PHENYLEPHRINE HCL-NACL 20-0.9 MG/250ML-% IV SOLN
INTRAVENOUS | Status: DC | PRN
  Administered 2022-06-25: 50 ug/min via INTRAVENOUS

## 2022-06-25 MED ORDER — STERILE WATER FOR IRRIGATION IR SOLN
Status: DC | PRN
  Administered 2022-06-25: 2000 mL

## 2022-06-25 MED ORDER — BUPIVACAINE LIPOSOME 1.3 % IJ SUSP
INTRAMUSCULAR | Status: DC | PRN
  Administered 2022-06-25: 10 mL via PERINEURAL

## 2022-06-25 MED ORDER — PROPOFOL 10 MG/ML IV BOLUS
INTRAVENOUS | Status: DC | PRN
  Administered 2022-06-25: 150 mg via INTRAVENOUS

## 2022-06-25 MED ORDER — VANCOMYCIN HCL 1000 MG IV SOLR
INTRAVENOUS | Status: AC
  Filled 2022-06-25: qty 20

## 2022-06-25 MED ORDER — LACTATED RINGERS IV BOLUS
250.0000 mL | Freq: Once | INTRAVENOUS | Status: AC
  Administered 2022-06-25: 250 mL via INTRAVENOUS

## 2022-06-25 MED ORDER — LACTATED RINGERS IV SOLN: INTRAVENOUS | Status: DC

## 2022-06-25 MED ORDER — CHLORHEXIDINE GLUCONATE 0.12 % MT SOLN
15.0000 mL | Freq: Once | OROMUCOSAL | Status: AC
  Administered 2022-06-25: 15 mL via OROMUCOSAL

## 2022-06-25 MED ORDER — ONDANSETRON HCL 4 MG/2ML IJ SOLN
INTRAMUSCULAR | Status: DC | PRN
  Administered 2022-06-25: 4 mg via INTRAVENOUS

## 2022-06-25 MED ORDER — ROCURONIUM BROMIDE 10 MG/ML (PF) SYRINGE
PREFILLED_SYRINGE | INTRAVENOUS | Status: AC
  Filled 2022-06-25: qty 10

## 2022-06-25 MED ORDER — CEFAZOLIN SODIUM-DEXTROSE 2-4 GM/100ML-% IV SOLN
2.0000 g | INTRAVENOUS | Status: AC
Start: 2022-06-25 — End: 2022-06-25
  Administered 2022-06-25: 2 g via INTRAVENOUS
  Filled 2022-06-25: qty 100

## 2022-06-25 MED ORDER — ROCURONIUM BROMIDE 10 MG/ML (PF) SYRINGE
PREFILLED_SYRINGE | INTRAVENOUS | Status: DC | PRN
  Administered 2022-06-25: 60 mg via INTRAVENOUS

## 2022-06-25 SURGICAL SUPPLY — 67 items
BAG COUNTER SPONGE SURGICOUNT (BAG) ×2 IMPLANT
BASEPLATE GLENOID RSA 3X25 0D (Shoulder) ×1 IMPLANT
BENZOIN TINCTURE PRP APPL 2/3 (GAUZE/BANDAGES/DRESSINGS) ×1 IMPLANT
BIT DRILL 3.2 PERIPHERAL SCREW (BIT) ×1 IMPLANT
BLADE SAW SGTL 73X25 THK (BLADE) ×2 IMPLANT
CHLORAPREP W/TINT 26 (MISCELLANEOUS) ×4 IMPLANT
CLSR STERI-STRIP ANTIMIC 1/2X4 (GAUZE/BANDAGES/DRESSINGS) ×2 IMPLANT
COOLER ICEMAN CLASSIC (MISCELLANEOUS) ×1 IMPLANT
COVER BACK TABLE 60X90IN (DRAPES) ×2 IMPLANT
COVER SURGICAL LIGHT HANDLE (MISCELLANEOUS) ×2 IMPLANT
DRAPE C-ARM 42X120 X-RAY (DRAPES) IMPLANT
DRAPE INCISE IOBAN 66X45 STRL (DRAPES) ×2 IMPLANT
DRAPE ORTHO SPLIT 77X108 STRL (DRAPES) ×4
DRAPE SHEET LG 3/4 BI-LAMINATE (DRAPES) ×4 IMPLANT
DRAPE SURG ORHT 6 SPLT 77X108 (DRAPES) ×2 IMPLANT
DRSG AQUACEL AG ADV 3.5X 6 (GAUZE/BANDAGES/DRESSINGS) ×2 IMPLANT
ELECT BLADE TIP CTD 4 INCH (ELECTRODE) ×2 IMPLANT
ELECT PENCIL ROCKER SW 15FT (MISCELLANEOUS) IMPLANT
ELECT REM PT RETURN 15FT ADLT (MISCELLANEOUS) ×2 IMPLANT
FACESHIELD WRAPAROUND (MASK) ×2 IMPLANT
FACESHIELD WRAPAROUND OR TEAM (MASK) ×1 IMPLANT
GLENOSPHERE CANN SHLD +3 36 (Shoulder) ×1 IMPLANT
GLOVE BIO SURGEON STRL SZ 6.5 (GLOVE) ×4 IMPLANT
GLOVE BIOGEL PI IND STRL 6.5 (GLOVE) ×1 IMPLANT
GLOVE BIOGEL PI IND STRL 8 (GLOVE) ×1 IMPLANT
GLOVE BIOGEL PI INDICATOR 6.5 (GLOVE) ×1
GLOVE BIOGEL PI INDICATOR 8 (GLOVE) ×1
GLOVE ECLIPSE 8.0 STRL XLNG CF (GLOVE) ×4 IMPLANT
GOWN STRL REUS W/ TWL LRG LVL3 (GOWN DISPOSABLE) ×2 IMPLANT
GOWN STRL REUS W/TWL LRG LVL3 (GOWN DISPOSABLE) ×4
GUIDE PIN 3X75 SHOULDER (PIN) ×2
GUIDEWIRE GLENOID 2.5X220 (WIRE) ×1 IMPLANT
HANDPIECE INTERPULSE COAX TIP (DISPOSABLE) ×2
INSERT HUM PERFORM 3/4 36 +0 (Insert) ×1 IMPLANT
KIT BASIN OR (CUSTOM PROCEDURE TRAY) ×2 IMPLANT
KIT STABILIZATION SHOULDER (MISCELLANEOUS) ×2 IMPLANT
KIT TURNOVER KIT A (KITS) ×1 IMPLANT
MANIFOLD NEPTUNE II (INSTRUMENTS) ×2 IMPLANT
NDL MAYO CATGUT SZ4 TPR NDL (NEEDLE) IMPLANT
NEEDLE MAYO CATGUT SZ4 (NEEDLE) IMPLANT
NS IRRIG 1000ML POUR BTL (IV SOLUTION) ×2 IMPLANT
PACK SHOULDER (CUSTOM PROCEDURE TRAY) ×2 IMPLANT
PAD COLD SHLDR WRAP-ON (PAD) IMPLANT
PIN GUIDE 3X75 SHOULDER (PIN) IMPLANT
RESTRAINT HEAD UNIVERSAL NS (MISCELLANEOUS) ×2 IMPLANT
SCREW 5.0X38 SMALL F/PERFORM (Screw) ×1 IMPLANT
SCREW 5.5X26 (Screw) ×1 IMPLANT
SCREW BONE 6.5X40 SM (Screw) ×1 IMPLANT
SET HNDPC FAN SPRY TIP SCT (DISPOSABLE) ×1 IMPLANT
SLING ARM FOAM STRAP MED (SOFTGOODS) ×1 IMPLANT
SLING ULTRA II L (ORTHOPEDIC SUPPLIES) ×1 IMPLANT
SLING ULTRA III MED (ORTHOPEDIC SUPPLIES) ×2 IMPLANT
SPONGE T-LAP 4X18 ~~LOC~~+RFID (SPONGE) ×2 IMPLANT
STEM HUM PERFORM 3+ (Stem) ×1 IMPLANT
SUCTION FRAZIER HANDLE 12FR (TUBING) ×2
SUCTION TUBE FRAZIER 12FR DISP (TUBING) ×1 IMPLANT
SUT ETHIBOND 2 V 37 (SUTURE) ×2 IMPLANT
SUT ETHIBOND NAB CT1 #1 30IN (SUTURE) ×2 IMPLANT
SUT FIBERWIRE #5 38 CONV NDL (SUTURE) ×8
SUT MNCRL AB 4-0 PS2 18 (SUTURE) ×2 IMPLANT
SUT VIC AB 0 CT1 36 (SUTURE) ×1 IMPLANT
SUT VIC AB 3-0 SH 27 (SUTURE) ×2
SUT VIC AB 3-0 SH 27X BRD (SUTURE) ×1 IMPLANT
SUTURE FIBERWR #5 38 CONV NDL (SUTURE) IMPLANT
TOWEL OR 17X26 10 PK STRL BLUE (TOWEL DISPOSABLE) ×2 IMPLANT
TUBE SUCTION HIGH CAP CLEAR NV (SUCTIONS) ×2 IMPLANT
WATER STERILE IRR 1000ML POUR (IV SOLUTION) ×4 IMPLANT

## 2022-06-25 NOTE — Anesthesia Procedure Notes (Signed)
Anesthesia Regional Block: Interscalene brachial plexus block   Pre-Anesthetic Checklist: , timeout performed,  Correct Patient, Correct Site, Correct Laterality,  Correct Procedure, Correct Position, site marked,  Risks and benefits discussed,  Surgical consent,  Pre-op evaluation,  At surgeon's request and post-op pain management  Laterality: Left  Prep: chloraprep       Needles:  Injection technique: Single-shot  Needle Type: Echogenic Stimulator Needle     Needle Length: 10cm  Needle Gauge: 21   Needle insertion depth: 7 cm   Additional Needles:   Procedures:,,,, ultrasound used (permanent image in chart),,    Narrative:  Start time: 06/25/2022 9:01 AM End time: 06/25/2022 9:06 AM Injection made incrementally with aspirations every 5 mL.  Performed by: Personally  Anesthesiologist: Josephine Igo, MD  Additional Notes: Timeout performed. Patient sedated. Relevant anatomy ID'd using [place]. Incremental 2-29m injection of LA with frequent aspiration. Patient tolerated procedure well.     Left Interscalene Block

## 2022-06-25 NOTE — Transfer of Care (Signed)
Immediate Anesthesia Transfer of Care Note  Patient: David Weaver  Procedure(s) Performed: REVERSE SHOULDER ARTHROPLASTY (Left: Shoulder)  Patient Location: PACU  Anesthesia Type:General and Regional  Level of Consciousness: sedated  Airway & Oxygen Therapy: Patient Spontanous Breathing and Patient connected to face mask oxygen  Post-op Assessment: Report given to RN and Post -op Vital signs reviewed and stable  Post vital signs: Reviewed and stable  Last Vitals:  Vitals Value Taken Time  BP    Temp    Pulse 71 06/25/22 1058  Resp 16 06/25/22 1058  SpO2 98 % 06/25/22 1058  Vitals shown include unvalidated device data.  Last Pain: There were no vitals filed for this visit.       Complications: No notable events documented.

## 2022-06-25 NOTE — Anesthesia Procedure Notes (Signed)
Procedure Name: Intubation Date/Time: 06/25/2022 9:35 AM  Performed by: Lind Covert, CRNAPre-anesthesia Checklist: Patient identified, Emergency Drugs available, Suction available, Patient being monitored and Timeout performed Patient Re-evaluated:Patient Re-evaluated prior to induction Oxygen Delivery Method: Circle system utilized Preoxygenation: Pre-oxygenation with 100% oxygen Induction Type: IV induction Ventilation: Mask ventilation with difficulty and Oral airway inserted - appropriate to patient size Laryngoscope Size: Mac and 4 Grade View: Grade I Tube type: Oral Tube size: 7.5 mm Number of attempts: 1 Airway Equipment and Method: Stylet Placement Confirmation: ETT inserted through vocal cords under direct vision, positive ETCO2 and breath sounds checked- equal and bilateral Secured at: 22 cm Tube secured with: Tape Dental Injury: Teeth and Oropharynx as per pre-operative assessment

## 2022-06-25 NOTE — Interval H&P Note (Signed)
All questions answered

## 2022-06-25 NOTE — Anesthesia Postprocedure Evaluation (Signed)
Anesthesia Post Note  Patient: David Weaver  Procedure(s) Performed: REVERSE SHOULDER ARTHROPLASTY (Left: Shoulder)     Patient location during evaluation: PACU Anesthesia Type: General Level of consciousness: awake and alert and oriented Pain management: pain level controlled Vital Signs Assessment: post-procedure vital signs reviewed and stable Respiratory status: spontaneous breathing, nonlabored ventilation and respiratory function stable Cardiovascular status: blood pressure returned to baseline and stable Postop Assessment: no apparent nausea or vomiting Anesthetic complications: no   No notable events documented.  Last Vitals:  Vitals:   06/25/22 1200 06/25/22 1215  BP: (!) 152/101 (!) 187/97  Pulse: 73 70  Resp: 15 16  Temp:    SpO2: 93% 92%    Last Pain:  Vitals:   06/25/22 1200  PainSc: 0-No pain                 Amayia Ciano A.

## 2022-06-25 NOTE — Discharge Instructions (Signed)
Ophelia Charter MD, MPH Noemi Chapel, PA-C [place] [address] [phone] (tel)   [phone] (fax)   [place] may leave the operative dressing in place until your follow-up appointment. KEEP THE INCISIONS CLEAN AND DRY. There may be a small amount of fluid/bleeding leaking at the surgical site. This is normal after surgery.  If it fills with liquid or blood please call us immediately to change it for you. Use the provided ice machine or Ice packs as often as possible for the first 3-4 days, then as needed for pain relief.   Keep a layer of cloth or a shirt between your skin and the cooling unit to prevent frost bite as it can get very cold.  SHOWERING: - You may shower on Post-Op Day #2.  - The dressing is water resistant but do not scrub it as it may start to peel up.   - You may remove the sling for showering - Gently pat the area dry.  - Do not soak the shoulder in water.  - Do not go swimming in the pool or ocean until your incision has completely healed (about 4-6 weeks after surgery) - KEEP THE INCISIONS CLEAN AND DRY.  EXERCISES Wear the sling at all times  You may remove the sling for showering, but keep the arm across the chest or in a secondary sling.    Accidental/Purposeful External Rotation and shoulder flexion (reaching behind you) is to be avoided at all costs for the first month. It is ok to come out of your sling if your are sitting and have assistance for eating.   Do not lift anything heavier than 1 pound until we discuss it further in clinic.  It is normal for your fingers/hand to become more swollen after surgery and discolored from bruising.   This will resolve over the first few weeks usually after surgery. Please continue to ambulate and do not stay sitting or lying for too long.  Perform foot and wrist pumps to assist in circulation.  PHYSICAL  THERAPY - You will begin physical therapy soon after surgery (unless otherwise specified) - Please call to set up an appointment, if you do not already have one  - Let our office if there are any issues with scheduling your therapy  - You have a physical therapy appointment scheduled at [facility] (across the [place] from our office) on Monday, July 31st @ [place] ([place]) The anesthesia team may have performed a nerve block for you this is a great tool used to minimize pain.   The block may start wearing off overnight (between 8-24 hours postop) When the block wears off, your pain may go from nearly zero to the pain you would have had postop without the block. This is an abrupt transition but nothing dangerous is happening.   This can be a challenging period but utilize your as needed pain medications to try and manage this period. We suggest you use the pain medication the first night prior to going to bed, to ease this transition.  You may take an extra dose of narcotic when this happens if needed   POST-OP MEDICATIONS- Multimodal approach to pain control In general your pain will be controlled with a combination of substances.  Prescriptions unless otherwise discussed are electronically sent to your pharmacy.  This is a carefully made plan we use to minimize narcotic  use.      Acetaminophen - Non-narcotic pain medicine taken on a scheduled basis  Gabapentin - this is a medication to help with nerve based pain, take on a scheduled basis Oxycodone - This is a strong narcotic, to be used only on an "as needed" basis for SEVERE breakthrough pain. Take this only for severe breakthrough pain not controlled by your daily pain prescription Robaxin - this is a muscle relaxer, take as needed for muscle spasms Zofran -  take as needed for nausea  You may restart your Xarelto 24 hours after surgery   FOLLOW-UP If you develop a Fever (>101.5), Redness or Drainage from the  surgical incision site, please call our office to arrange for an evaluation. Please call the office to schedule a follow-up appointment for a wound check, 7-10 days post-operatively.  IF YOU HAVE ANY QUESTIONS, PLEASE FEEL FREE TO CALL OUR OFFICE.  HELPFUL INFORMATION  Your arm will be in a sling following surgery. You will be in this sling for the next 4 weeks.   You may be more comfortable sleeping in a semi-seated position the first few nights following surgery.  Keep a pillow propped under the elbow and forearm for comfort.  If you have a recliner type of chair it might be beneficial.  If not that is fine too, but it would be helpful to sleep propped up with pillows behind your operated shoulder as well under your elbow and forearm.  This will reduce pulling on the suture lines.  When dressing, put your operative arm in the sleeve first.  When getting undressed, take your operative arm out last.  Loose fitting, button-down shirts are recommended.  In most states it is against the law to drive while your arm is in a sling. And certainly against the law to drive while taking narcotics.  You may return to work/school in the next couple of days when you feel up to it. Desk work and typing in the sling is fine.  We suggest you use the pain medication the first night prior to going to bed, in order to ease any pain when the anesthesia wears off. You should avoid taking pain medications on an empty stomach as it will make you nauseous.  You should wean off your narcotic medicines as soon as you are able.     Most patients will be off or using minimal narcotics before their first postop appointment.   Do not drink alcoholic beverages or take illicit drugs when taking pain medications.  Pain medication may make you constipated.  Below are a few solutions to try in this order: Decrease the amount of pain medication if you aren't having pain. Drink lots of decaffeinated fluids. Drink prune juice  and/or each dried prunes  If the first 3 don't work start with additional solutions Take Colace - an over-the-counter stool softener Take Senokot - an over-the-counter laxative Take Miralax - a stronger over-the-counter laxative   Dental Antibiotics:  In most cases prophylactic antibiotics for Dental procdeures after total joint surgery are not necessary.  Exceptions are as follows:  1. History of prior total joint infection  2. Severely immunocompromised (Organ Transplant, cancer chemotherapy, Rheumatoid biologic meds such as [place])  3. Poorly controlled diabetes (A1C &gt; 8.0, blood glucose over 200)  If you have one of these conditions, contact your surgeon for an antibiotic prescription, prior to your dental procedure.   For more information including helpful videos and documents visit our website:  [email or web address]

## 2022-06-25 NOTE — Op Note (Signed)
Orthopaedic Surgery Operative Note (CSN: [id number])  David Weaver  Sep 25, 1951 Date of Surgery: 06/25/2022   Diagnoses:  Left shoulder glenohumeral arthritis with cuff tear  Procedure: Left reverse lateralized total Shoulder Arthroplasty   Operative Finding Successful completion of planned procedure.  Patient had good bone quality and good fixation.  His axillary nerve tug test was not able to be performed secondary to infra coracoid scarring.  We stayed away from this area.  Post-operative plan: The patient will be NWB in sling.  The patient will be will be discharged from PACU if continues to be stable as was plan prior to surgery.  DVT prophylaxis Aspirin 81 mg twice daily for 6 weeks.  Pain control with PRN pain medication preferring oral medicines.  Follow up plan will be scheduled in approximately 7 days for incision check and XR.  Physical therapy to start immediately.  Implants: Tornier size 3+ perform humeral stem, 0 polyethylene, 36+3 glenosphere with a 25+3 baseplate 40 center screw and 2 peripheral locking screws  Post-Op Diagnosis: Same Surgeons:Primary: David Gash, MD Assistants:David McBane PA-C Location: [facility] ROOM 06 Anesthesia: General with Exparel Interscalene Antibiotics: Ancef 2g preop, Vancomycin '1000mg'$  locally Tourniquet time: None Estimated Blood Loss: 188 Complications: None Specimens: None Implants: Implant Name Type Inv. Item Serial No. Manufacturer Lot No. LRB No. Used Action  BASEPLATE GLENOID RSA [id number] [id number] - [id number] Shoulder BASEPLATE GLENOID RSA [id number] [id number]  [employer] [id number] Left 1 Implanted  SCREW BONE 6.5X40 SM - [id number] Screw SCREW BONE 6.5X40 SM  [employer]  Left 1 Implanted  SCREW 5.0X38 SMALL F/PERFORM - [id number] Screw SCREW 5.0X38 SMALL F/PERFORM  [employer]  Left 1 Implanted  SCREW 5.5X26 - [id number] Screw SCREW 5.5X26  [employer]  Left 1 Implanted  GLENOSPHERE CANN SHLD +3 36 - [id number] Shoulder GLENOSPHERE CANN SHLD +3 36  [employer]  INC [id number] Left 1 Implanted  STEM HUM PERFORM 3+ - [id number] Stem STEM HUM PERFORM 3+  [employer] [id number] Left 1 Implanted  TORNIER PERFORM HUMERAL SYSTEM    [employer] [id number] Left 1 Implanted    Indications for Surgery:   David Weaver is a 71 y.o. male with cuff tear arthropathy.  Benefits and risks of operative and nonoperative management were discussed prior to surgery with patient/guardian(s) and informed consent form was completed.  Infection and need for further surgery were discussed as was prosthetic stability and cuff issues.  We additionally specifically discussed risks of axillary nerve injury, infection, periprosthetic fracture, continued pain and longevity of implants prior to beginning procedure.      Procedure:   The patient was identified in the preoperative holding area where the surgical site was marked. Block placed by anesthesia with exparel.  The patient was taken to the OR where a procedural timeout was called and the above noted anesthesia was induced.  The patient was positioned beachchair on allen table with spider arm positioner.  Preoperative antibiotics were dosed.  The patient's left shoulder was prepped and draped in the usual sterile fashion.  A second preoperative timeout was called.       Standard deltopectoral approach was performed with a #10 blade. We dissected down to the subcutaneous tissues and the cephalic vein was taken laterally with the deltoid. Clavipectoral fascia was incised in line with the incision. Deep retractors were placed. The long of the biceps tendon was identified and there was significant tenosynovitis present.  Tenodesis was performed to the pectoralis tendon with #2 Ethibond. The remaining  biceps was followed up into the rotator interval where it was released.   The subscapularis was taken down in a full thickness layer with capsule along the humeral neck extending inferiorly around the humeral head. We continued  releasing the capsule directly off of the osteophytes inferiorly all the way around the corner. This allowed [place] to dislocate the humeral head.   The humeral head had evidence of severe osteoarthritic wear with full-thickness cartilage loss and exposed subchondral bone. There was significant flattening of the humeral head.   The rotator cuff was carefully examined and noted to be irreperably torn.  The decision was confirmed that a reverse total shoulder was indicated for this patient.  There were osteophytes along the inferior humeral neck. The osteophytes were removed with an osteotome and a rongeur.  Osteophytes were removed with a rongeur and an osteotome and the anatomic neck was well visualized.     A humeral cutting guide was used extra medullary with a pin to help control version. The version was set at 20 of retroversion. Humeral osteotomy was performed with an oscillating saw. The head fragment was passed off the back table.  A cut protector plate was placed.  The subscapularis was again identified and immediately we took care to palpate the axillary nerve anteriorly and verify its position with gentle palpation as well as the tug test.  Unfortunately due to subcoracoid scarring we were unable to palpate it and instead stayed away from the inferior border the subscapularis.  An anterior deltoid retractor was then placed as well as a small Hohmann retractor superiorly.   The glenoid was inspected and had evidence of severe osteoarthritic wear with full-thickness cartilage loss and exposed subchondral bone.    The remaining labrum was removed circumferentially taking great care not to disrupt the posterior capsule.   The glenoid drill guide was placed and used to drill a guide pin in the center, inferior position. The glenoid face was then reamed concentrically over the guide wire. The center hole was drilled over the guidepin in a near anatomic angle of version. Next the glenoid vault was  drilled back to a depth of 40 mm.  We tapped and then placed a 63m size baseplate with additional 315mlateralization was selected with a 6.5 mm x 40 mm length central screw.  The base plate was screwed into the glenoid vault obtaining secure fixation. We next placed superior and inferior locking screws for additional fixation.  Next a 36 + 3 mm glenosphere was selected and impacted onto the baseplate. The center screw was tightened.  We turned attention back to the humeral side. The cut protector was removed.  We used the perform humeral sizing block to select the appropriate size which for this patient was a 3.  We then placed our center pin and reamed over it concentrically obtaining appropriate inset.  We then used our lateralizing chisel to prepare the lateral aspect of the humerus.  At that point we selected the appropriate implant trialing a 3+.  Using this trial implant we trialed multiple polyethylene sizes settling on a 0 which provided good stability and range of motion without excess soft tissue tension. The offset was dialed in to match the normal anatomy. The shoulder was trialed.  There was good ROM in all planes and the shoulder was stable with no inferior translation.  The real humeral implants were opened after again confirming sizes.  The trial was removed. #5 Fiberwire x4 sutures passed through the humeral  neck for subscap repair. The humeral component was press-fit obtaining a secure fit. The joint was reduced and thoroughly irrigated with pulsatile lavage. Subscap was repaired back with #5 Fiberwire sutures through bone tunnels. Hemostasis was obtained. The deltopectoral interval was reapproximated with #1 Ethibond. The subcutaneous tissues were closed with 2-0 Vicryl and the skin was closed with running monocryl.    The wounds were cleaned and dried and an Aquacel dressing was placed. The drapes taken down. The arm was placed into sling with abduction pillow. Patient was awakened,  extubated, and transferred to the recovery room in stable condition. There were no intraoperative complications. The sponge, needle, and attention counts were  correct at the end of the case.     Noemi Chapel, PA-C, present and scrubbed throughout the case, critical for completion in a timely fashion, and for retraction, instrumentation, closure.

## 2022-06-26 ENCOUNTER — Encounter (HOSPITAL_COMMUNITY): Payer: Self-pay | Admitting: Orthopaedic Surgery

## 2022-10-27 ENCOUNTER — Inpatient Hospital Stay (HOSPITAL_COMMUNITY): Payer: Medicare Other

## 2022-10-27 ENCOUNTER — Inpatient Hospital Stay (HOSPITAL_BASED_OUTPATIENT_CLINIC_OR_DEPARTMENT_OTHER)
Admission: EM | Admit: 2022-10-27 | Discharge: 2022-10-31 | DRG: 392 | Disposition: A | Discharge status: Home / Self Care | Payer: Medicare Other | Attending: Internal Medicine | Admitting: Internal Medicine

## 2022-10-27 ENCOUNTER — Encounter (HOSPITAL_COMMUNITY): Payer: Self-pay

## 2022-10-27 ENCOUNTER — Encounter (HOSPITAL_BASED_OUTPATIENT_CLINIC_OR_DEPARTMENT_OTHER): Payer: Self-pay | Admitting: Emergency Medicine

## 2022-10-27 ENCOUNTER — Emergency Department (HOSPITAL_BASED_OUTPATIENT_CLINIC_OR_DEPARTMENT_OTHER): Payer: Medicare Other

## 2022-10-27 ENCOUNTER — Other Ambulatory Visit: Payer: Self-pay

## 2022-10-27 DIAGNOSIS — F1090 Alcohol use, unspecified, uncomplicated: Secondary | ICD-10-CM | POA: Insufficient documentation

## 2022-10-27 DIAGNOSIS — K5720 Diverticulitis of large intestine with perforation and abscess without bleeding: Secondary | ICD-10-CM | POA: Diagnosis present

## 2022-10-27 DIAGNOSIS — Z7952 Long term (current) use of systemic steroids: Secondary | ICD-10-CM

## 2022-10-27 DIAGNOSIS — Z7984 Long term (current) use of oral hypoglycemic drugs: Secondary | ICD-10-CM

## 2022-10-27 DIAGNOSIS — Z833 Family history of diabetes mellitus: Secondary | ICD-10-CM

## 2022-10-27 DIAGNOSIS — M329 Systemic lupus erythematosus, unspecified: Secondary | ICD-10-CM | POA: Diagnosis present

## 2022-10-27 DIAGNOSIS — E876 Hypokalemia: Secondary | ICD-10-CM | POA: Diagnosis present

## 2022-10-27 DIAGNOSIS — R3911 Hesitancy of micturition: Secondary | ICD-10-CM | POA: Diagnosis present

## 2022-10-27 DIAGNOSIS — E781 Pure hyperglyceridemia: Secondary | ICD-10-CM | POA: Diagnosis present

## 2022-10-27 DIAGNOSIS — Z7901 Long term (current) use of anticoagulants: Secondary | ICD-10-CM

## 2022-10-27 DIAGNOSIS — G8929 Other chronic pain: Secondary | ICD-10-CM | POA: Diagnosis present

## 2022-10-27 DIAGNOSIS — I480 Paroxysmal atrial fibrillation: Secondary | ICD-10-CM | POA: Diagnosis present

## 2022-10-27 DIAGNOSIS — I5032 Chronic diastolic (congestive) heart failure: Secondary | ICD-10-CM | POA: Diagnosis present

## 2022-10-27 DIAGNOSIS — I110 Hypertensive heart disease with heart failure: Secondary | ICD-10-CM | POA: Diagnosis present

## 2022-10-27 DIAGNOSIS — Z79899 Other long term (current) drug therapy: Secondary | ICD-10-CM

## 2022-10-27 DIAGNOSIS — E7800 Pure hypercholesterolemia, unspecified: Secondary | ICD-10-CM | POA: Diagnosis present

## 2022-10-27 DIAGNOSIS — Z79631 Long term (current) use of antimetabolite agent: Secondary | ICD-10-CM

## 2022-10-27 DIAGNOSIS — Z8262 Family history of osteoporosis: Secondary | ICD-10-CM

## 2022-10-27 DIAGNOSIS — Z96653 Presence of artificial knee joint, bilateral: Secondary | ICD-10-CM | POA: Diagnosis present

## 2022-10-27 DIAGNOSIS — M797 Fibromyalgia: Secondary | ICD-10-CM | POA: Diagnosis present

## 2022-10-27 DIAGNOSIS — Z9049 Acquired absence of other specified parts of digestive tract: Secondary | ICD-10-CM

## 2022-10-27 DIAGNOSIS — F39 Unspecified mood [affective] disorder: Secondary | ICD-10-CM | POA: Diagnosis present

## 2022-10-27 DIAGNOSIS — K5792 Diverticulitis of intestine, part unspecified, without perforation or abscess without bleeding: Secondary | ICD-10-CM | POA: Diagnosis present

## 2022-10-27 DIAGNOSIS — I2510 Atherosclerotic heart disease of native coronary artery without angina pectoris: Secondary | ICD-10-CM | POA: Diagnosis present

## 2022-10-27 DIAGNOSIS — I10 Essential (primary) hypertension: Secondary | ICD-10-CM | POA: Diagnosis not present

## 2022-10-27 DIAGNOSIS — Z820 Family history of epilepsy and other diseases of the nervous system: Secondary | ICD-10-CM

## 2022-10-27 DIAGNOSIS — H9190 Unspecified hearing loss, unspecified ear: Secondary | ICD-10-CM | POA: Diagnosis present

## 2022-10-27 DIAGNOSIS — Z96612 Presence of left artificial shoulder joint: Secondary | ICD-10-CM | POA: Diagnosis present

## 2022-10-27 DIAGNOSIS — Z87891 Personal history of nicotine dependence: Secondary | ICD-10-CM | POA: Diagnosis not present

## 2022-10-27 DIAGNOSIS — R300 Dysuria: Secondary | ICD-10-CM | POA: Diagnosis present

## 2022-10-27 DIAGNOSIS — F1010 Alcohol abuse, uncomplicated: Secondary | ICD-10-CM | POA: Diagnosis present

## 2022-10-27 DIAGNOSIS — E119 Type 2 diabetes mellitus without complications: Secondary | ICD-10-CM

## 2022-10-27 DIAGNOSIS — Z955 Presence of coronary angioplasty implant and graft: Secondary | ICD-10-CM

## 2022-10-27 DIAGNOSIS — Z800 Family history of malignant neoplasm of digestive organs: Secondary | ICD-10-CM

## 2022-10-27 DIAGNOSIS — Z841 Family history of disorders of kidney and ureter: Secondary | ICD-10-CM

## 2022-10-27 DIAGNOSIS — Z8249 Family history of ischemic heart disease and other diseases of the circulatory system: Secondary | ICD-10-CM

## 2022-10-27 LAB — CBC WITH DIFFERENTIAL/PLATELET
Abs Immature Granulocytes: 0.05 10*3/uL (ref 0.00–0.07)
Basophils Absolute: 0 10*3/uL (ref 0.0–0.1)
Basophils Relative: 0 %
Eosinophils Absolute: 0.2 10*3/uL (ref 0.0–0.5)
Eosinophils Relative: 2 %
HCT: 40 % (ref 39.0–52.0)
Hemoglobin: 13.4 g/dL (ref 13.0–17.0)
Immature Granulocytes: 1 %
Lymphocytes Relative: 5 %
Lymphs Abs: 0.6 10*3/uL — ABNORMAL LOW (ref 0.7–4.0)
MCH: 27.6 pg (ref 26.0–34.0)
MCHC: 33.5 g/dL (ref 30.0–36.0)
MCV: 82.3 fL (ref 80.0–100.0)
Monocytes Absolute: 0.9 10*3/uL (ref 0.1–1.0)
Monocytes Relative: 9 %
Neutro Abs: 9.1 10*3/uL — ABNORMAL HIGH (ref 1.7–7.7)
Neutrophils Relative %: 83 %
Platelets: 293 10*3/uL (ref 150–400)
RBC: 4.86 MIL/uL (ref 4.22–5.81)
RDW: 16.8 % — ABNORMAL HIGH (ref 11.5–15.5)
WBC: 10.8 10*3/uL — ABNORMAL HIGH (ref 4.0–10.5)
nRBC: 0 % (ref 0.0–0.2)

## 2022-10-27 LAB — COMPREHENSIVE METABOLIC PANEL
ALT: 26 U/L (ref 0–44)
AST: 20 U/L (ref 15–41)
Albumin: 3.8 g/dL (ref 3.5–5.0)
Alkaline Phosphatase: 88 U/L (ref 38–126)
Anion gap: 13 (ref 5–15)
BUN: 22 mg/dL (ref 8–23)
CO2: 23 mmol/L (ref 22–32)
Calcium: 8.9 mg/dL (ref 8.9–10.3)
Chloride: 97 mmol/L — ABNORMAL LOW (ref 98–111)
Creatinine, Ser: 0.8 mg/dL (ref 0.61–1.24)
GFR, Estimated: >60 mL/min (ref >60)
Glucose, Bld: 116 mg/dL — ABNORMAL HIGH (ref 70–99)
Potassium: 3.4 mmol/L — ABNORMAL LOW (ref 3.5–5.1)
Sodium: 133 mmol/L — ABNORMAL LOW (ref 135–145)
Total Bilirubin: 0.9 mg/dL (ref 0.3–1.2)
Total Protein: 6.6 g/dL (ref 6.5–8.1)

## 2022-10-27 LAB — LIPASE, BLOOD: Lipase: 10 U/L — ABNORMAL LOW (ref 11–51)

## 2022-10-27 LAB — GLUCOSE, CAPILLARY: Glucose-Capillary: 85 mg/dL (ref 70–99)

## 2022-10-27 MED ORDER — INSULIN ASPART 100 UNIT/ML IJ SOLN
0.0000 [IU] | INTRAMUSCULAR | Status: DC
  Administered 2022-10-30: 2 [IU] via SUBCUTANEOUS
  Administered 2022-10-30 – 2022-10-31 (×2): 1 [IU] via SUBCUTANEOUS

## 2022-10-27 MED ORDER — SODIUM CHLORIDE 0.9 % IV SOLN
2.0000 g | Freq: Once | INTRAVENOUS | Status: AC
  Administered 2022-10-27: 2 g via INTRAVENOUS

## 2022-10-27 MED ORDER — SODIUM CHLORIDE 0.9 % IV SOLN: 2.0000 g | INTRAVENOUS | Status: DC

## 2022-10-27 MED ORDER — SODIUM CHLORIDE 0.9 % IV BOLUS
500.0000 mL | Freq: Once | INTRAVENOUS | Status: AC
  Administered 2022-10-27: 500 mL via INTRAVENOUS

## 2022-10-27 MED ORDER — METRONIDAZOLE 500 MG/100ML IV SOLN
500.0000 mg | Freq: Two times a day (BID) | INTRAVENOUS | Status: DC
  Administered 2022-10-28: 500 mg via INTRAVENOUS
  Filled 2022-10-27: qty 100

## 2022-10-27 MED ORDER — FENTANYL CITRATE PF 50 MCG/ML IJ SOSY
25.0000 ug | PREFILLED_SYRINGE | INTRAMUSCULAR | Status: DC | PRN
  Administered 2022-10-27: 25 ug via INTRAVENOUS
  Filled 2022-10-27: qty 1

## 2022-10-27 MED ORDER — ACETAMINOPHEN 325 MG PO TABS: 650.0000 mg | ORAL_TABLET | Freq: Four times a day (QID) | ORAL | Status: DC | PRN

## 2022-10-27 MED ORDER — METOPROLOL TARTRATE 25 MG PO TABS
25.0000 mg | ORAL_TABLET | Freq: Two times a day (BID) | ORAL | Status: DC
  Administered 2022-10-28: 25 mg via ORAL
  Filled 2022-10-27: qty 1

## 2022-10-27 MED ORDER — IOHEXOL 300 MG/ML  SOLN
100.0000 mL | Freq: Once | INTRAMUSCULAR | Status: AC | PRN
  Administered 2022-10-27: 80 mL via INTRAVENOUS

## 2022-10-27 MED ORDER — HYDROCODONE-ACETAMINOPHEN 5-325 MG PO TABS: 1.0000 | ORAL_TABLET | ORAL | Status: DC | PRN

## 2022-10-27 MED ORDER — SODIUM CHLORIDE 0.9 % IV SOLN: INTRAVENOUS | Status: DC

## 2022-10-27 MED ORDER — METRONIDAZOLE 500 MG/100ML IV SOLN
500.0000 mg | Freq: Once | INTRAVENOUS | Status: AC
  Administered 2022-10-27: 500 mg via INTRAVENOUS
  Filled 2022-10-27: qty 100

## 2022-10-27 MED ORDER — ACETAMINOPHEN 650 MG RE SUPP: 650.0000 mg | Freq: Four times a day (QID) | RECTAL | Status: DC | PRN

## 2022-10-27 NOTE — Assessment & Plan Note (Addendum)
Hold Xarelto as patient may need surgical intervention Resume metoprolol if able to tolerate

## 2022-10-27 NOTE — Progress Notes (Signed)
Plan of Care Note for accepted transfer   Patient: David Weaver MRN: [id number]   [place]: 10/27/2022  Facility requesting transfer: [facility] Requesting Provider: Dr. Rogene Houston Reason for transfer: Diverticulitis w/ abscess Facility course: 71 yo M w/ PMHx of DM2, HLD, CAD, PAF. Presenting with abdominal pain. W/u revealed diverticulitis w/ abscess. He was started on rocephin and flagyl. EDPA spoke with General surgery. Rec admission to medicine at [facility] and they will follow.  Plan of care: The patient is accepted for admission to Telemetry unit, at [facility].  While holding at [facility], medical decision making responsibilities remain with the [place]. Upon arrival to [facility], [facility] will assume care. Thank you.   Author: Jonnie Finner, DO 10/27/2022  Check [email or web address] for on-call coverage.  Nursing staff, Please call [place] number on Amion as soon as patient's arrival, so appropriate admitting provider can evaluate the pt.

## 2022-10-27 NOTE — Assessment & Plan Note (Addendum)
Check magnesium level replete as needed Check other electrolytes including phosphate repleted needed

## 2022-10-27 NOTE — Assessment & Plan Note (Signed)
Chronic stable hold aspirin Resume p.o. meds when able to tolerate p.o.

## 2022-10-27 NOTE — Assessment & Plan Note (Signed)
Allow permissive hypertension for tonight if needed would tend to restart beta-blocker

## 2022-10-27 NOTE — H&P (Signed)
[name] UKG:[id number] DOB: 15-Mar-1951 DOA: 10/27/2022   PCP: Wardell Honour, MD   Outpatient Specialists:   CARDS:  Dr. Gordy Clement, MD    Patient arrived to ER on 10/27/22 at 0940 Referred by Attending Fredia Sorrow, MD   Patient coming from:    home Lives   With family    Chief Complaint:   Chief Complaint  Patient presents with   Groin Pain    HPI: David Weaver is a 71 y.o. male with medical history significant of hypertension hyperlipidemia diabetes type 2 lupus, CAD status post LAD PCI, paroxysmal atrial fibrillation on anticoagulation, diastolic CHF, mild carotid stenosis history of alcohol abuse fibromyalgia,    Presented with abdominal pain and inguinal pain Patient presented with groin pain and dysuria No testicular pain but did endorse hesitancy no constipation or diarrhea No fevers or chills no chest pain or shortness of breath   Drinks 2 beers a night, denies withdrawal Last ETOH 4 days ago Last Xarelto dose was last night  No black stool no blood in stools No fever no chills Regarding pertinent Chronic problems:     Hyperlipidemia  on statins Lipitor (atorvastatin)  Lipid Panel     Component Value Date/Time   CHOL 161 07/30/2020 0824   TRIG 335 (H) 07/30/2020 0824   HDL 57 07/30/2020 0824   CHOLHDL 2.8 07/30/2020 0824   CHOLHDL 3.9 09/02/2016 1035   VLDL 43 (H) 09/02/2016 1035   LDLCALC 53 07/30/2020 0824   LABVLDL 51 (H) 07/30/2020 0824     HTN on Norvasc Cozaar Lopressor  Lupus on Plaquenil and  methotrexate  chronic CHF diastolic - last echo 7628 showing EF 55% and grade 1 diastolic dysfunction    CAD  - On Aspirin, statin, betablocker,                  -  followed by cardiology                - last cardiac cath  The ASCVD Risk score (Arnett DK, et al., 2019) failed to calculate for the following reasons:   The valid total cholesterol range is 130 to 320 mg/dL     DM 2 -  Lab Results  Component Value  Date   HGBA1C 6.5 (H) 06/19/2022    PO meds only, metformin      A. Fib -  - CHA2DS2 vas score   4     current  on anticoagulation with Xarelto,    -  Rate control:  Currently controlled with  Metoprolol,     While in ER:   Patient noted to have periumbilical left lower quadrant tenderness white blood cell count 10.8 CT scan showed diverticulitis with abscess General surgery was consulted plan to admit to medicine   CXR - ordered  CTabd/pelvis -  Advanced diverticulitis affecting the sigmoid colon with adjacent 8 x 2.5 x 2.5 cm abscess and free intraperitoneal air.  Left-sided bladder diverticulum also present in that region as seen previously.  Following Medications were ordered in ER: Medications  sodium chloride 0.9 % bolus 500 mL (0 mLs Intravenous Stopped 10/27/22 1703)  iohexol (OMNIPAQUE) 300 MG/ML solution 100 mL (80 mLs Intravenous Contrast Given 10/27/22 1233)  cefTRIAXone (ROCEPHIN) 2 g in sodium chloride 0.9 % 100 mL IVPB (0 g Intravenous Stopped 10/27/22 1703)    And  metroNIDAZOLE (FLAGYL) IVPB 500 mg (0 mg Intravenous Stopped  10/27/22 1703)    _______________________________________________________ ER Provider Called: General surgery     They Recommend admit to medicine   Will see in AM      ED Triage Vitals  Enc Vitals Group     BP 10/27/22 0948 110/68     Pulse Rate 10/27/22 0948 78     Resp 10/27/22 0948 18     Temp 10/27/22 0948 98.9 F (37.2 C)     Temp Source 10/27/22 0948 Oral     SpO2 10/27/22 0948 99 %     Weight --      Height --      Head Circumference --      Peak Flow --      Pain Score 10/27/22 0952 8     Pain Loc --      Pain Edu? --      Excl. in [place]? --   TMAX(24)@     _________________________________________ Significant initial  Findings: Abnormal Labs Reviewed  CBC WITH DIFFERENTIAL/PLATELET - Abnormal; Notable for the following components:      Result Value   WBC 10.8 (*)    RDW 16.8 (*)    Neutro Abs 9.1 (*)    Lymphs Abs  0.6 (*)    All other components within normal limits  COMPREHENSIVE METABOLIC PANEL - Abnormal; Notable for the following components:   Sodium 133 (*)    Potassium 3.4 (*)    Chloride 97 (*)    Glucose, Bld 116 (*)    All other components within normal limits  LIPASE, BLOOD - Abnormal; Notable for the following components:   Lipase 10 (*)    All other components within normal limits    ECG: Ordered Personally reviewed and interpreted by me showing: HR : 85 Rhythm:Normal sinus rhythm Nonspecific ST abnormality Abnormal ECG When compared QTC 462    The recent clinical data is shown below. Vitals:   10/27/22 1654 10/27/22 1834 10/27/22 2000 10/27/22 2133  BP: (!) 131/55  (!) 144/79 131/72  Pulse: 77  79 82  Resp: '17  19 20  '$ Temp:  98.2 F (36.8 C)  97.8 F (36.6 C)  TempSrc:  Oral  Oral  SpO2: 94%  93% 95%    WBC     Component Value Date/Time   WBC 10.8 (H) 10/27/2022 1033   LYMPHSABS 0.6 (L) 10/27/2022 1033   LYMPHSABS 1.3 08/04/2018 1654   MONOABS 0.9 10/27/2022 1033   EOSABS 0.2 10/27/2022 1033   EOSABS 0.2 08/04/2018 1654   BASOSABS 0.0 10/27/2022 1033   BASOSABS 0.1 08/04/2018 1654    Lactic Acid, Venous No results found for: "LATICACIDVEN"    UA   ordered  Results for orders placed or performed during the hospital encounter of 06/19/22  Surgical pcr screen     Status: None   Collection Time: 06/19/22  1:27 PM   Specimen: Nasal Mucosa; Nasal Swab  Result Value Ref Range Status   MRSA, PCR NEGATIVE NEGATIVE Final   Staphylococcus aureus NEGATIVE NEGATIVE Final    Comment: (NOTE) The Xpert SA Assay (FDA approved for NASAL specimens in patients 46 years of age and older), is one component of a comprehensive surveillance program. It is not intended to diagnose infection nor to guide or monitor treatment. Performed at [facility], [place] [address], [place], [place] [address]      _______________________________________________ Hospitalist was called for admission for   Diverticulitis of large intestine with abscess without bleeding  The following Work up has been ordered so far:  Orders Placed This Encounter  Procedures   Urine Culture   CT ABDOMEN PELVIS W CONTRAST   Urinalysis, Routine w reflex microscopic   CBC with Differential   Comprehensive metabolic panel   Lipase, blood   Cardiac monitoring   Care order/instruction: Upon arrival to [facility], nursing staff will need to notify PATIENT PLACEMENT that the patient has arrived. The flow manager will contact [place] to assign patient to the appropriate physician. Nursing staff will need to notify the ...   Consult to general surgery   Consult to hospitalist   Insert peripheral IV   Admit to Inpatient (patient's expected length of stay will be greater than 2 midnights or inpatient only procedure)     OTHER Significant initial  Findings:  labs showing:  Recent Labs  Lab 10/27/22 1033  NA 133*  K 3.4*  CO2 23  GLUCOSE 116*  BUN 22  CREATININE 0.80  CALCIUM 8.9    Cr  stable,   Lab Results  Component Value Date   CREATININE 0.80 10/27/2022   CREATININE 0.85 06/19/2022   CREATININE 0.70 (L) 10/22/2021    Recent Labs  Lab 10/27/22 1033  AST 20  ALT 26  ALKPHOS 88  BILITOT 0.9  PROT 6.6  ALBUMIN 3.8   Lab Results  Component Value Date   CALCIUM 8.9 10/27/2022    Plt: Lab Results  Component Value Date   PLT 293 10/27/2022     Recent Labs  Lab 10/27/22 1033  WBC 10.8*  NEUTROABS 9.1*  HGB 13.4  HCT 40.0  MCV 82.3  PLT 293    HG/HCT   stable,     Component Value Date/Time   HGB 13.4 10/27/2022 1033   HGB 15.5 08/04/2018 1654   HCT 40.0 10/27/2022 1033   HCT 47.1 08/04/2018 1654   MCV 82.3 10/27/2022 1033   MCV 86 08/04/2018 1654      Recent Labs  Lab 10/27/22 1033  LIPASE 10*     Cardiac Panel (last 3 results) No results for input(s): "CKTOTAL", "CKMB",  "TROPONINI", "RELINDX" in the last 72 hours.     DM  labs:  HbA1C: Recent Labs    06/19/22 1327  HGBA1C 6.5*      CBG (last 3)  No results for input(s): "GLUCAP" in the last 72 hours.    Cultures:    Component Value Date/Time   SDES URINE, CLEAN CATCH 01/25/2013 1217   SPECREQUEST CX ADDED 01/25/13 1500 01/25/2013 1217   CULT NO GROWTH 01/25/2013 1217   REPTSTATUS 01/26/2013 FINAL 01/25/2013 1217     Radiological Exams on Admission: CT ABDOMEN PELVIS W CONTRAST  Result Date: 10/27/2022 CLINICAL DATA:  Abdominal pain, acute, nonlocalized.  Dysuria. EXAM: CT ABDOMEN AND PELVIS WITH CONTRAST TECHNIQUE: Multidetector CT imaging of the abdomen and pelvis was performed using the standard protocol following bolus administration of intravenous contrast. RADIATION DOSE REDUCTION: This exam was performed according to the departmental dose-optimization program which includes automated exposure control, adjustment of the mA and/or kV according to patient size and/or use of iterative reconstruction technique. CONTRAST:  67m OMNIPAQUE IOHEXOL 300 MG/ML  SOLN COMPARISON:  02/15/2018 FINDINGS: Lower chest: Chronic scarring at the medial lung bases. Hepatobiliary: Previous cholecystectomy. Liver parenchyma is normal. Pancreas: Normal Spleen: Normal Adrenals/Urinary Tract: Adrenal glands are normal. Kidneys are normal. No cyst, mass, stone or hydronephrosis. There is probably reactive thickening of the left side of the bladder due to adjacent  diverticulitis. Stomach/Bowel: Stomach and small intestine are normal. Right colon is normal. There is advanced diverticulitis affecting the sigmoid colon with regional inflammatory edema throughout the fat, an adjacent abscess measuring 8 x 2.5 x 2.5 cm, and free intraperitoneal air. Left-sided bladder diverticulum also present in that region as seen previously. Vascular/Lymphatic: Aortic atherosclerosis. No aneurysm. IVC is normal. No adenopathy. Reproductive: Normal  Other: None Musculoskeletal: Scoliosis and degenerative change of the spine. IMPRESSION: 1. Advanced diverticulitis affecting the sigmoid colon with adjacent 8 x 2.5 x 2.5 cm abscess and free intraperitoneal air. 2. Left-sided bladder diverticulum. 3. Aortic atherosclerosis. 4. Scoliosis and degenerative change of the spine. Aortic Atherosclerosis (ICD10-I70.0). Electronically Signed   By: Nelson Chimes M.D.   On: 10/27/2022 12:43   _______________________________________________________________________________________________________ Latest  Blood pressure 131/72, pulse 82, temperature 97.8 F (36.6 C), temperature source Oral, resp. rate 20, SpO2 95 %.   Vitals  labs and radiology finding personally reviewed  Review of Systems:    Pertinent positives include:  fatigue,abdominal pain, nausea,  Constitutional:  No weight loss, night sweats, Fevers, chills,  weight loss  HEENT:  No headaches, Difficulty swallowing,Tooth/dental problems,Sore throat,  No sneezing, itching, ear ache, nasal congestion, post nasal drip,  Cardio-vascular:  No chest pain, Orthopnea, PND, anasarca, dizziness, palpitations.no Bilateral lower extremity swelling  GI:  No heartburn, indigestion,  vomiting, diarrhea, change in bowel habits, loss of appetite, melena, blood in stool, hematemesis Resp:  no shortness of breath at rest. No dyspnea on exertion, No excess mucus, no productive cough, No non-productive cough, No coughing up of blood.No change in color of mucus.No wheezing. Skin:  no rash or lesions. No jaundice GU:  no dysuria, change in color of urine, no urgency or frequency. No straining to urinate.  No flank pain.  Musculoskeletal:  No joint pain or no joint swelling. No decreased range of motion. No back pain.  Psych:  No change in mood or affect. No depression or anxiety. No memory loss.  Neuro: no localizing neurological complaints, no tingling, no weakness, no double vision, no gait abnormality, no  slurred speech, no confusion  All systems reviewed and apart from August all are negative _______________________________________________________________________________________________ Past Medical History:   Past Medical History:  Diagnosis Date   Alcohol abuse, daily use    Allergic rhinitis, cause unspecified    Angina pectoris ([place]) 03/26/2020   Atypical mole 10/19/2019   Right Cheek ([place])   Dysthymic disorder    Esophageal reflux    Essential hypertension, benign    Fibromyalgia    followed by [name] every six months.   Fibromyositis    Hypertriglyceridemia 07/20/2020   Impotence of organic origin    Insomnia, unspecified    Left knee DJD    Lupus erythematosus    Osteoarthrosis, unspecified whether generalized or localized, lower leg    Other abnormal glucose    Other specified disorder of male genital organs(608.89)    Pure hypercholesterolemia 07/20/2020   S/P coronary artery stent placement 07/20/2020   LAD PCI 03/2020   Systemic lupus erythematosus ([place])    Unspecified hearing loss    Unspecified vitamin D deficiency     Past Surgical History:  Procedure Laterality Date   CARPAL TUNNEL RELEASE     bilateral   CHOLECYSTECTOMY     COLONOSCOPY  03/01/2014   two polyps; Dr. Benson Norway.  Repeat 5 years.   CORONARY STENT INTERVENTION N/A 04/06/2020   Procedure: CORONARY STENT INTERVENTION;  Surgeon: Wellington Hampshire, MD;  Location:  [facility];  Service: Cardiovascular;  Laterality: N/A;   GANGLION CYST EXCISION  1980   Left wrist   JOINT REPLACEMENT  01/2013   right knee, left knee. [place].   KNEE ARTHROPLASTY     KNEE ARTHROSCOPY  2008   Right   LEFT HEART CATH AND CORONARY ANGIOGRAPHY N/A 04/06/2020   Procedure: LEFT HEART CATH AND CORONARY ANGIOGRAPHY;  Surgeon: Wellington Hampshire, MD;  Location: [facility];  Service: Cardiovascular;  Laterality: N/A;   lumbar spine epidural  2009   [facility]   REVERSE SHOULDER ARTHROPLASTY Left  06/25/2022   Procedure: REVERSE SHOULDER ARTHROPLASTY;  Surgeon: Hiram Gash, MD;  Location: [facility];  Service: Orthopedics;  Laterality: Left;   ROTATOR CUFF REPAIR  10/2008   right   TOTAL KNEE ARTHROPLASTY  12/27/2012   Procedure: TOTAL KNEE ARTHROPLASTY;  Surgeon: [name], MD;  Location: [facility];  Service: Orthopedics;  Laterality: Right;  RIGHT ARTHROPLASTY KNEE MEDIAL AND LATERAL COMPARTMENTS WITH PATELLA RESURFACING, RIGHT TOTAL KNEE REPLACEMENT   TOTAL KNEE ARTHROPLASTY Left 01/31/2013   Procedure: TOTAL KNEE ARTHROPLASTY;  Surgeon: [name], MD;  Location: [facility];  Service: Orthopedics;  Laterality: Left;   TOTAL SHOULDER ARTHROPLASTY      Social History:  Ambulatory   independently     reports that he quit smoking about 7 years ago. His smoking use included cigarettes. He has a 75.00 pack-year smoking history. He has never used smokeless tobacco. He reports current alcohol use of about 14.0 - 21.0 standard drinks of alcohol per week. He reports that he does not currently use drugs.  Family History: Family History  Problem Relation Age of Onset   Alzheimer's disease Mother    Osteoporosis Mother    Heart disease Father 41       AMI   Alcohol abuse Father    Cancer Brother        colon cancer   Diabetes Brother    Kidney failure Brother   _________________________________ Allergies: No Known Allergies  Prior to Admission medications   Medication Sig Start Date End Date Taking? Authorizing Provider  amLODipine (NORVASC) 5 MG tablet Take 5 mg by mouth daily.    [provider]  atorvastatin (LIPITOR) 40 MG tablet Take 1 tablet (40 mg total) by mouth daily. 04/27/20   Almyra Deforest, PA  Camphor-Menthol-Methyl Sal (SALONPAS) 3.12-06-08 % [facility] Place 1 patch onto the skin daily as needed (pain).    [provider]  carboxymethylcellulose (REFRESH PLUS) 0.5 % SOLN Place 1 drop into both eyes 3 (three) times daily as needed (dry eyes).    [provider]  chlorpheniramine (CHLOR-TRIMETON) 4 MG tablet Take 4 mg by mouth 2 (two) times daily as needed for allergies.    [provider]  Cholecalciferol (DIALYVITE VITAMIN D 5000) 125 MCG (5000 UT) capsule Take 5,000 Units by mouth daily.    [provider]  colestipol (COLESTID) 1 g tablet Take 1 g by mouth 2 (two) times daily. 03/05/18   [provider]  DULoxetine (CYMBALTA) 60 MG capsule Take 1 capsule (60 mg total) 2 (two) times daily by mouth. 10/13/17   Wardell Honour, MD  folic acid (FOLVITE) 1 MG tablet Take 1 mg by mouth daily. 07/02/21   [provider]  hydroxychloroquine (PLAQUENIL) 200 MG tablet TAKE 1 TABLET BY MOUTH TWICE A DAY 01/07/21   Ofilia Neas, PA-C  losartan (COZAAR) 100 MG  tablet Take 1 tablet (100 mg total) by mouth daily. 09/12/21   Skeet Latch, MD  metFORMIN (GLUCOPHAGE) 500 MG tablet Take 500 mg by mouth daily. 09/22/20 06/13/22  [provider]  methocarbamol (ROBAXIN) 500 MG tablet Take 1 tablet (500 mg total) by mouth every 8 (eight) hours as needed for muscle spasms. 06/25/22   McBane, Maylene Roes, PA-C  methotrexate (RHEUMATREX) 2.5 MG tablet Take 10 mg by mouth once a week. Caution:Chemotherapy. Protect from light.    [provider]  metoprolol tartrate (LOPRESSOR) 50 MG tablet Take 50 mg by mouth 2 (two) times daily.    [provider]  mirtazapine (REMERON) 15 MG tablet Take 15 mg by mouth at bedtime.  04/14/19   [provider]  [facility] ULTRA test strip 1 each daily. 09/22/20   [provider]  OVER THE COUNTER MEDICATION Apply 1 Application topically daily as needed (leg pain). CBD Cream    [provider]  pantoprazole (PROTONIX) 40 MG tablet TAKE 1 TABLET BY MOUTH DAILY 07/24/20   Skeet Latch, MD  predniSONE (DELTASONE) 1 MG tablet Take 3 mg by mouth daily. 08/07/20   [provider]  rivaroxaban (XARELTO) 20 MG TABS tablet Take 1 tablet (20 mg total) by mouth  daily with supper. 06/08/20   Deberah Pelton, NP  ______________________________________________________________________________________ Physical Exam:    10/27/2022    9:33 PM 10/27/2022    8:00 PM 10/27/2022    4:54 PM  Vitals with BMI  Systolic 170 017 494  Diastolic 72 79 55  Pulse 82 79 77   1. General:  in No  Acute distress   Chronically ill   -appearing 2. Psychological: Alert and   Oriented 3. Head/ENT:   Dry Mucous Membranes                          Head Non traumatic, neck supple                        Poor Dentition 4. SKIN:  decreased Skin turgor,  Skin clean Dry and intact no rash 5. Heart: Regular rate and rhythm no  Murmur, no Rub or gallop 6. Lungs:  no wheezes or crackles   7. Abdomen: Soft,  non-tender, Non distended  bowel sounds present 8. Lower extremities: no clubbing, cyanosis, no  edema 9. Neurologically Grossly intact, moving all 4 extremities equally   10. MSK: Normal range of motion    Chart has been reviewed  ______________________________________________________________________________________________  Assessment/Plan 71 y.o. male with medical history significant of hypertension hyperlipidemia diabetes type 2 lupus, CAD status post LAD PCI, paroxysmal atrial fibrillation on anticoagulation, diastolic CHF, mild carotid stenosis history of alcohol abuse fibromyalgia,   Admitted for   Diverticulitis of large intestine with abscess     Present on Admission:  Acute diverticulitis  Essential hypertension, benign  Paroxysmal atrial fibrillation ([name])  Systemic lupus erythematosus ([place])  Hypokalemia     Acute diverticulitis   - Given perforation appreciate general surgery consult,  -   - Bowel rest  NPO  - Will rehydrate  - Continue IV antibiotics as patient was unable to tolerate p.o. due to significant nausea  rocephin and flagyl              10/27/22   Essential hypertension, benign Allow permissive hypertension for tonight if needed  would tend to restart beta-blocker  Paroxysmal atrial fibrillation ([place]) Hold  Xarelto as patient may need surgical intervention Resume metoprolol if able to tolerate  S/P coronary artery stent placement Chronic stable hold aspirin Resume p.o. meds when able to tolerate p.o.  Systemic lupus erythematosus ([place]) Given intra-abdominal infectious process we will hold Plaquenil and methotrexate.  Patient is on small dose of prednisone.  Hold for tonight but may need to resume if general surgery is comfortable patient chronically on 3 mg of prednisone a day which is a very low dose  Type 2 diabetes mellitus without complication, without long-term current use of insulin ([place]) Order sliding scale hold metformin check hemoglobin A1c and TSH  Hypokalemia Check magnesium level replete as needed Check other electrolytes including phosphate repleted needed    Other plan as per orders.  DVT prophylaxis:  SCD      Code Status:    Code Status: Prior FULL CODE as per patient   I had personally discussed CODE STATUS with patient    Family Communication:   Family not at  Bedside    Disposition Plan:     To home once workup is complete and patient is stable   Following barriers for discharge:                                                        Pain controlled with PO medications                               Afebrile, white count improving able to transition to PO antibiotics                                                    Will need consultants to evaluate patient prior to discharge                      Consults called: general surgery is aware    Admission status:  ED Disposition     ED Disposition  Admit   Condition  --   [place]: [place] [[id number]]  Level of Care: Telemetry [5]  Admit to tele based on following criteria: Monitor for Ischemic changes  May admit patient to [facility] or [facility] if equivalent level of care is available::  No  Interfacility transfer: Yes  Covid Evaluation: Asymptomatic - no recent exposure (last 10 days) testing not required  Diagnosis: Acute diverticulitis [[id number]]  Admitting Physician: Jackelyn Knife [[id number]]  Attending Physician: Fredia Sorrow [[id number]]  Certification:: I certify this patient will need inpatient services for at least 2 midnights  Estimated Length of Stay: 2            inpatient     I Expect 2 midnight stay secondary to severity of patient's current illness need for inpatient interventions justified by the following:    Severe lab/radiological/exam abnormalities including:    diverticultis and extensive comorbidities including:  DM2   Chronic anticoagulation  That are currently affecting medical management.   I expect  patient to be hospitalized for 2 midnights requiring inpatient medical care.  Patient is at high risk for adverse  outcome (such as loss of life or disability) if not treated.  Indication for inpatient stay as follows:    inability to maintain oral hydration    Need for operative/procedural  intervention     Need for IV antibiotics, IV fluids     Level of care     tele  For 12H     Hernando Reali 10/27/2022, 11:31 PM    [facility]     after 2 AM please page floor coverage PA If 7AM-7PM, please contact the day team taking care of the patient using [email or web address]   Patient was evaluated in the context of the global COVID-19 pandemic, which necessitated consideration that the patient might be at risk for infection with the SARS-CoV-2 virus that causes COVID-19. Institutional protocols and algorithms that pertain to the evaluation of patients at risk for COVID-19 are in a state of rapid change based on information released by regulatory bodies including the CDC and federal and state organizations. These policies and algorithms were followed during the patient's care.

## 2022-10-27 NOTE — ED Triage Notes (Signed)
Pt arrives to ED with c/o groin pain and dysuria. He notes pain to the end of his penis.

## 2022-10-27 NOTE — Subjective & Objective (Signed)
Patient presented with groin pain and dysuria No testicular pain but did endorse hesitancy no constipation or diarrhea No fevers or chills no chest pain or shortness of breath

## 2022-10-27 NOTE — ED Provider Notes (Signed)
[facility] [facility] EMERGENCY DEPT Provider Note   CSN: [id number] Arrival date & time: 10/27/22  0940     History  Chief Complaint  Patient presents with   Groin Pain    David Weaver is a 71 y.o. male with a past medical history of hypertension, hypercholesterolemia, type 2 diabetes and lupus presenting today with left groin pain.  Reports he has been having pain for the past couple of days.  No testicular pain but does endorse dysuria and urinary hesitancy.  Not sexually active and no penile discharge.  Does not wear a brief and is not having any problems with bowel movements.   Groin Pain       Home Medications Prior to Admission medications   Medication Sig Start Date End Date Taking? Authorizing Provider  amLODipine (NORVASC) 5 MG tablet Take 5 mg by mouth daily.    [provider]  atorvastatin (LIPITOR) 40 MG tablet Take 1 tablet (40 mg total) by mouth daily. 04/27/20   Almyra Deforest, PA  Camphor-Menthol-Methyl Sal (SALONPAS) 3.12-06-08 % [facility] Place 1 patch onto the skin daily as needed (pain).    [provider]  carboxymethylcellulose (REFRESH PLUS) 0.5 % SOLN Place 1 drop into both eyes 3 (three) times daily as needed (dry eyes).    [provider]  chlorpheniramine (CHLOR-TRIMETON) 4 MG tablet Take 4 mg by mouth 2 (two) times daily as needed for allergies.    [provider]  Cholecalciferol (DIALYVITE VITAMIN D 5000) 125 MCG (5000 UT) capsule Take 5,000 Units by mouth daily.    [provider]  colestipol (COLESTID) 1 g tablet Take 1 g by mouth 2 (two) times daily. 03/05/18   [provider]  DULoxetine (CYMBALTA) 60 MG capsule Take 1 capsule (60 mg total) 2 (two) times daily by mouth. 10/13/17   Wardell Honour, MD  folic acid (FOLVITE) 1 MG tablet Take 1 mg by mouth daily. 07/02/21   [provider]  hydroxychloroquine (PLAQUENIL) 200 MG tablet TAKE 1 TABLET BY MOUTH TWICE A DAY 01/07/21   Ofilia Neas, PA-C   losartan (COZAAR) 100 MG tablet Take 1 tablet (100 mg total) by mouth daily. 09/12/21   Skeet Latch, MD  metFORMIN (GLUCOPHAGE) 500 MG tablet Take 500 mg by mouth daily. 09/22/20 06/13/22  [provider]  methocarbamol (ROBAXIN) 500 MG tablet Take 1 tablet (500 mg total) by mouth every 8 (eight) hours as needed for muscle spasms. 06/25/22   McBane, Maylene Roes, PA-C  methotrexate (RHEUMATREX) 2.5 MG tablet Take 10 mg by mouth once a week. Caution:Chemotherapy. Protect from light.    [provider]  metoprolol tartrate (LOPRESSOR) 50 MG tablet Take 50 mg by mouth 2 (two) times daily.    [provider]  mirtazapine (REMERON) 15 MG tablet Take 15 mg by mouth at bedtime.  04/14/19   [provider]  [facility] ULTRA test strip 1 each daily. 09/22/20   [provider]  OVER THE COUNTER MEDICATION Apply 1 Application topically daily as needed (leg pain). CBD Cream    [provider]  pantoprazole (PROTONIX) 40 MG tablet TAKE 1 TABLET BY MOUTH DAILY 07/24/20   Skeet Latch, MD  predniSONE (DELTASONE) 1 MG tablet Take 3 mg by mouth daily. 08/07/20   [provider]  rivaroxaban (XARELTO) 20 MG TABS tablet Take 1 tablet (20 mg total) by mouth daily with supper. 06/08/20   Deberah Pelton, NP      Allergies  Patient has no known allergies.    Review of Systems   Review of Systems  Physical Exam Updated Vital Signs BP 110/68 (BP Location: Right Arm)   Pulse 78   Temp 98.9 F (37.2 C) (Oral)   Resp 18   SpO2 99%  Physical Exam Vitals and nursing note reviewed.  Constitutional:      Appearance: Normal appearance.  HENT:     Head: Normocephalic and atraumatic.  Eyes:     General: No scleral icterus.    Conjunctiva/sclera: Conjunctivae normal.  Pulmonary:     Effort: Pulmonary effort is normal. No respiratory distress.  Abdominal:     General: There is distension.     Palpations: Abdomen is soft.     Tenderness: There is  abdominal tenderness (Moderate left lower quadrant and periumbilical tenderness).  Skin:    Findings: No rash.  Neurological:     Mental Status: He is alert.  Psychiatric:        Mood and Affect: Mood normal.     ED Results / Procedures / Treatments   Labs (all labs ordered are listed, but only abnormal results are displayed) Labs Reviewed  URINE CULTURE  URINALYSIS, ROUTINE W REFLEX MICROSCOPIC  CBC WITH DIFFERENTIAL/PLATELET  COMPREHENSIVE METABOLIC PANEL  LIPASE, BLOOD    EKG None  Radiology No results found.  Procedures Procedures   Medications Ordered in ED Medications - No data to display  ED Course/ Medical Decision Making/ A&P                           Medical Decision Making Amount and/or Complexity of Data Reviewed Labs: ordered. Radiology: ordered.  Risk Prescription drug management. Decision regarding hospitalization.   71 year old male presenting today with left groin pain.  Differential includes but is not limited to inguinal hernia, torsion, varicocele diverticulitis, bowel obstruction.  This is not an exhaustive differential.    Past Medical History / Co-morbidities / Social History: Hypertension, hypercholesterolemia and type 2 diabetes     Physical Exam: Pertinent physical exam findings include Periumbilical and left lower quadrant tenderness  Lab Tests: I ordered, and personally interpreted labs.  The pertinent results include: WBC 10.8 Sodium 133, chloride 97, potassium 3.4   Imaging Studies: I ordered and independently visualized and interpreted patient's CT scan.  Worrisome for diverticulitis with abscess development.      Medications: I ordered medication including Rocephin, Flagyl, IV fluids.  Patient continues to decline pain medication   Consultations Obtained: I spoke with Dr. Kieth Brightly with general surgery and they recommend medicine admission to [facility].  MDM/Disposition: This is a 71 year old male who presented  today with left groin pain.  Had abdominal tenderness.  CT shows diverticulitis with large abscess.  No history of diverticulosis to patient's knowledge.  Spoke with general surgery who recommends admission to [facility].  Plan will be to admit to Dr. Marylyn Ishihara.    I discussed this case with my attending physician Dr. Rogene Houston who cosigned this note including patient's presenting symptoms, physical exam, and planned diagnostics and interventions. Attending physician stated agreement with plan or made changes to plan which were implemented.     Final Clinical Impression(s) / ED Diagnoses Final diagnoses:  Diverticulitis    Rx / DC Orders ED Discharge Orders     None      Admit to Dr. Marylyn Ishihara at [facility]   Amran Malter, [place], PA-C 10/27/22 1333    Fredia Sorrow, MD  10/30/22 1532  

## 2022-10-27 NOTE — ED Notes (Signed)
Received patient in no acute distress breathing spontaneously to room air . Denies pain at this time . Patient pending admission bed . Continuing care and monitoring .

## 2022-10-27 NOTE — Assessment & Plan Note (Signed)
Order sliding scale hold metformin check hemoglobin A1c and TSH

## 2022-10-27 NOTE — Assessment & Plan Note (Signed)
Given intra-abdominal infectious process we will hold Plaquenil and methotrexate.  Patient is on small dose of prednisone.  Hold for tonight but may need to resume if general surgery is comfortable patient chronically on 3 mg of prednisone a day which is a very low dose

## 2022-10-27 NOTE — Assessment & Plan Note (Signed)
-   Given perforation appreciate general surgery consult,  -   - Bowel rest  NPO  - Will rehydrate  - Continue IV antibiotics as patient was unable to tolerate p.o. due to significant nausea  rocephin and flagyl              10/27/22

## 2022-10-27 NOTE — ED Notes (Addendum)
Re-paged Gen Surg ([facility]), per PA request

## 2022-10-27 NOTE — ED Notes (Signed)
Pt on monitor and Pt states he is unable to provide a urine sample at this time.

## 2022-10-28 DIAGNOSIS — I10 Essential (primary) hypertension: Secondary | ICD-10-CM | POA: Diagnosis not present

## 2022-10-28 DIAGNOSIS — E876 Hypokalemia: Secondary | ICD-10-CM | POA: Diagnosis not present

## 2022-10-28 DIAGNOSIS — K5720 Diverticulitis of large intestine with perforation and abscess without bleeding: Secondary | ICD-10-CM | POA: Diagnosis not present

## 2022-10-28 DIAGNOSIS — F1090 Alcohol use, unspecified, uncomplicated: Secondary | ICD-10-CM | POA: Insufficient documentation

## 2022-10-28 DIAGNOSIS — K5792 Diverticulitis of intestine, part unspecified, without perforation or abscess without bleeding: Secondary | ICD-10-CM | POA: Diagnosis not present

## 2022-10-28 DIAGNOSIS — M329 Systemic lupus erythematosus, unspecified: Secondary | ICD-10-CM

## 2022-10-28 LAB — GLUCOSE, CAPILLARY
Glucose-Capillary: 71 mg/dL (ref 70–99)
Glucose-Capillary: 72 mg/dL (ref 70–99)
Glucose-Capillary: 79 mg/dL (ref 70–99)
Glucose-Capillary: 80 mg/dL (ref 70–99)
Glucose-Capillary: 83 mg/dL (ref 70–99)
Glucose-Capillary: 86 mg/dL (ref 70–99)
Glucose-Capillary: 95 mg/dL (ref 70–99)

## 2022-10-28 LAB — MAGNESIUM: Magnesium: 2.3 mg/dL (ref 1.7–2.4)

## 2022-10-28 LAB — COMPREHENSIVE METABOLIC PANEL
ALT: 29 U/L (ref 0–44)
AST: 23 U/L (ref 15–41)
Albumin: 3.2 g/dL — ABNORMAL LOW (ref 3.5–5.0)
Alkaline Phosphatase: 76 U/L (ref 38–126)
Anion gap: 12 (ref 5–15)
BUN: 18 mg/dL (ref 8–23)
CO2: 20 mmol/L — ABNORMAL LOW (ref 22–32)
Calcium: 8.4 mg/dL — ABNORMAL LOW (ref 8.9–10.3)
Chloride: 106 mmol/L (ref 98–111)
Creatinine, Ser: 0.7 mg/dL (ref 0.61–1.24)
GFR, Estimated: >60 mL/min (ref >60)
Glucose, Bld: 87 mg/dL (ref 70–99)
Potassium: 3 mmol/L — ABNORMAL LOW (ref 3.5–5.1)
Sodium: 138 mmol/L (ref 135–145)
Total Bilirubin: 0.8 mg/dL (ref 0.3–1.2)
Total Protein: 6.4 g/dL — ABNORMAL LOW (ref 6.5–8.1)

## 2022-10-28 LAB — CBC
HCT: 38.6 % — ABNORMAL LOW (ref 39.0–52.0)
Hemoglobin: 12.5 g/dL — ABNORMAL LOW (ref 13.0–17.0)
MCH: 27.2 pg (ref 26.0–34.0)
MCHC: 32.4 g/dL (ref 30.0–36.0)
MCV: 83.9 fL (ref 80.0–100.0)
Platelets: 301 10*3/uL (ref 150–400)
RBC: 4.6 MIL/uL (ref 4.22–5.81)
RDW: 16.7 % — ABNORMAL HIGH (ref 11.5–15.5)
WBC: 8.5 10*3/uL (ref 4.0–10.5)
nRBC: 0 % (ref 0.0–0.2)

## 2022-10-28 LAB — CK: Total CK: 34 U/L — ABNORMAL LOW (ref 49–397)

## 2022-10-28 LAB — PHOSPHORUS: Phosphorus: 3.5 mg/dL (ref 2.5–4.6)

## 2022-10-28 LAB — LACTIC ACID, PLASMA
Lactic Acid, Venous: 0.7 mmol/L (ref 0.5–1.9)
Lactic Acid, Venous: 0.9 mmol/L (ref 0.5–1.9)

## 2022-10-28 LAB — HEMOGLOBIN A1C
Hgb A1c MFr Bld: 6.4 % — ABNORMAL HIGH (ref 4.8–5.6)
Mean Plasma Glucose: 137 mg/dL

## 2022-10-28 MED ORDER — METHOCARBAMOL 500 MG PO TABS: 500.0000 mg | ORAL_TABLET | Freq: Three times a day (TID) | ORAL | Status: DC | PRN

## 2022-10-28 MED ORDER — PIPERACILLIN-TAZOBACTAM 3.375 G IVPB
3.3750 g | Freq: Three times a day (TID) | INTRAVENOUS | Status: DC
  Administered 2022-10-28 – 2022-10-31 (×9): 3.375 g via INTRAVENOUS
  Filled 2022-10-28 (×9): qty 50

## 2022-10-28 MED ORDER — POTASSIUM CHLORIDE 2 MEQ/ML IV SOLN
INTRAVENOUS | Status: DC
  Filled 2022-10-28 (×8): qty 1000

## 2022-10-28 MED ORDER — DULOXETINE HCL 30 MG PO CPEP
60.0000 mg | ORAL_CAPSULE | Freq: Two times a day (BID) | ORAL | Status: DC
  Administered 2022-10-28 – 2022-10-31 (×6): 60 mg via ORAL
  Filled 2022-10-28 (×6): qty 2

## 2022-10-28 MED ORDER — FOLIC ACID 1 MG PO TABS
1.0000 mg | ORAL_TABLET | Freq: Every day | ORAL | Status: DC
  Administered 2022-10-28 – 2022-10-31 (×4): 1 mg via ORAL
  Filled 2022-10-28 (×4): qty 1

## 2022-10-28 MED ORDER — METOPROLOL TARTRATE 50 MG PO TABS
50.0000 mg | ORAL_TABLET | Freq: Two times a day (BID) | ORAL | Status: DC
  Administered 2022-10-28 – 2022-10-31 (×6): 50 mg via ORAL
  Filled 2022-10-28 (×6): qty 1

## 2022-10-28 MED ORDER — PANTOPRAZOLE SODIUM 40 MG PO TBEC
40.0000 mg | DELAYED_RELEASE_TABLET | Freq: Every day | ORAL | Status: DC
  Administered 2022-10-29 – 2022-10-31 (×3): 40 mg via ORAL
  Filled 2022-10-28 (×3): qty 1

## 2022-10-28 MED ORDER — METOPROLOL TARTRATE 5 MG/5ML IV SOLN: 2.5000 mg | INTRAVENOUS | Status: DC | PRN

## 2022-10-28 MED ORDER — POTASSIUM CHLORIDE 10 MEQ/100ML IV SOLN
10.0000 meq | INTRAVENOUS | Status: AC
  Administered 2022-10-28 (×4): 10 meq via INTRAVENOUS
  Filled 2022-10-28: qty 100

## 2022-10-28 MED ORDER — POTASSIUM CHLORIDE 10 MEQ/100ML IV SOLN
10.0000 meq | INTRAVENOUS | Status: AC
  Administered 2022-10-28 (×2): 10 meq via INTRAVENOUS
  Filled 2022-10-28 (×2): qty 100

## 2022-10-28 MED ORDER — OXYCODONE HCL 5 MG PO TABS
5.0000 mg | ORAL_TABLET | ORAL | Status: DC | PRN
  Administered 2022-10-29 – 2022-10-30 (×5): 5 mg via ORAL
  Filled 2022-10-28 (×5): qty 1

## 2022-10-28 MED ORDER — ENOXAPARIN SODIUM 40 MG/0.4ML IJ SOSY
40.0000 mg | PREFILLED_SYRINGE | INTRAMUSCULAR | Status: DC
  Administered 2022-10-28: 40 mg via SUBCUTANEOUS
  Filled 2022-10-28: qty 0.4

## 2022-10-28 MED ORDER — MIRTAZAPINE 15 MG PO TABS
15.0000 mg | ORAL_TABLET | Freq: Every day | ORAL | Status: DC
  Administered 2022-10-28 – 2022-10-30 (×3): 15 mg via ORAL
  Filled 2022-10-28 (×3): qty 1

## 2022-10-28 NOTE — Progress Notes (Signed)
  Transition of Care [facility]) Screening Note   Patient Details  Name: David Weaver Date of Birth: 22-Jul-1951   Transition of Care [facility] CM/SW Contact:    Vassie Moselle, [place] Phone Number: 10/28/2022, 9:01 AM    Transition of Care Department [facility]) has reviewed patient and no TOC needs have been identified at this time. We will continue to monitor patient advancement through interdisciplinary progression rounds. If new patient transition needs arise, please place a TOC consult.

## 2022-10-28 NOTE — Progress Notes (Signed)
Initial Nutrition Assessment  INTERVENTION:   Once diet advanced: -Ensure MAX Protein po BID, each supplement provides 150 kcal and 30 grams of protein   NUTRITION DIAGNOSIS:   Increased nutrient needs related to acute illness (diverticulitis) as evidenced by estimated needs.  GOAL:   Patient will meet greater than or equal to 90% of their needs  MONITOR:   PO intake, Supplement acceptance, Diet advancement, Labs, I & O's, Weight trends  REASON FOR ASSESSMENT:   Consult Assessment of nutrition requirement/status  ASSESSMENT:   71 y.o. male with medical history significant of hypertension hyperlipidemia diabetes type 2 lupus, CAD status post LAD PCI, paroxysmal atrial fibrillation on anticoagulation, diastolic CHF, mild carotid stenosis history of alcohol abuse fibromyalgia.    Admitted for Diverticulitis of large intestine with abscess.  Patient in room, states he last ate on Thanksgiving day, not very much. Pt states recently him and his wife began to make dietary changes in order to better manage blood sugar. Pt states he began eating less carbohydrates and no sugary items. Pt reports he noticed weight loss as a result of these changes. Pt states he takes Vitamin B-12 and D3.  Denies any swallowing or chewing difficulties.   Per weight records, pt has lost 15 lbs since 7/20 (8% wt loss x 4 months, insignificant for time frame).  Medications: Lactated ringers, KCl  Labs reviewed: CBGs: 79-95 Low K   NUTRITION - FOCUSED PHYSICAL EXAM:  No depletions noted.  Diet Order:   Diet Order             Diet NPO time specified Except for: Ice Chips, Sips with Meds  Diet effective now                   EDUCATION NEEDS:   No education needs have been identified at this time  Skin:  Skin Assessment: Reviewed RN Assessment  Last BM:  11/28  Height:   Ht Readings from Last 1 Encounters:  10/27/22 '5\' 8"'$  (1.727 m)    Weight:   Wt Readings from Last 1 Encounters:   10/27/22 77.4 kg    BMI:  Body mass index is 25.95 kg/m.  Estimated Nutritional Needs:   Kcal:  1900-2100  Protein:  95-105g  Fluid:  2L/day  Clayton Bibles, MS, RD, LDN Inpatient Clinical Dietitian Contact information available via Amion

## 2022-10-28 NOTE — Progress Notes (Signed)
PROGRESS NOTE  David Weaver PFX:[id number] DOB: November 29, 1951   PCP: Wardell Honour, MD  Patient is from: Home.  Lives with his wife.  DOA: 10/27/2022 LOS: 1  Chief complaints Chief Complaint  Patient presents with   Groin Pain     Brief Narrative / Interim history: 71 year old M with PMH of CAD s/p PCI to LAD, paroxysmal A-fib on Xarelto, diastolic CHF, DM-2, HTN, SLE and alcohol use disorder presenting with LLQ abdominal pain and dysuria and admitted for acute sigmoid diverticulitis with abscess and contained perforation.  Started on IV antibiotics.  General surgery consulted and did not feel abscess is unable to drainage after discussion with IR.  Changed antibiotics to IV Zosyn.  Subjective: Seen and examined earlier this morning.  No major events overnight of this morning.  Reports LLQ pain.  Describes pain as mild.  No other complaints.  Objective: Vitals:   10/28/22 0116 10/28/22 0528 10/28/22 0922 10/28/22 1325  BP: (!) 147/76 (!) 156/84 109/86 (!) 148/99  Pulse: 89 89 87 72  Resp: '18 20 20 18  '$ Temp: (!) 97.4 F (36.3 C) 97.7 F (36.5 C) 98.1 F (36.7 C) 97.8 F (36.6 C)  TempSrc: Oral Oral Oral Oral  SpO2: 99% 94% 96% 95%  Weight:      Height:        Examination:  GENERAL: No apparent distress.  Nontoxic. HEENT: MMM.  Vision and hearing grossly intact.  NECK: Supple.  No apparent JVD.  RESP:  No IWOB.  Fair aeration bilaterally. CVS:  RRR. Heart sounds normal.  ABD/GI/GU: BS+. Abd soft.  Mild LLQ tenderness. MSK/EXT:  Moves extremities. No apparent deformity. No edema.  SKIN: no apparent skin lesion or wound NEURO: Awake, alert and oriented appropriately.  No apparent focal neuro deficit. PSYCH: Calm. Normal affect.   Procedures:  None  Microbiology summarized: Urine culture pending  Assessment and plan: Principal Problem:   Acute diverticulitis Active Problems:   Essential hypertension, benign   Paroxysmal atrial fibrillation ([name])   S/P  coronary artery stent placement   Systemic lupus erythematosus ([name])   SLE (systemic lupus erythematosus related syndrome) ([place])   Type 2 diabetes mellitus without complication, without long-term current use of insulin ([name])   Hypokalemia  Acute sigmoid diverticulitis with abscess and contained perforation: CT showed advanced sigmoid diverticulitis with adjacent 8 x 2.5 x 2.5 cm abscess and free intraperitoneal air.  Surgery and IR did not feel abscess is amenable to drainage. -General surgery managing -Changed antibiotics to IV Zosyn -Pain control -NPO, sips with meds and ice chips -Per surgery, Lovenox for DVT prophylaxis okay  History of CAD s/p PCI in 03/2020: No cardiopulmonary symptoms. -Continue home meds.  Paroxysmal A-fib: Rate controlled.  Systemic lupus erythematous -Hold home MTX, Plaquenil and prednisone  Controlled NIDDM-2: A1c 6.5% in 05/2022.  On metformin at home. Recent Labs  Lab 10/27/22 2331 10/28/22 0147 10/28/22 0619 10/28/22 0733 10/28/22 1139  GLUCAP 85 79 86 95 83  -CBG monitoring while n.p.o. -Recheck hemoglobin A1c  Essential hypertension: BP within acceptable range. -Resume home metoprolol. -Continue holding losartan and amlodipine for now   Mood disorder/chronic pain: -Continue home medications  Alcohol use disorder: Reports smoking about 4 beers a day. -Monitor for withdrawal symptoms.   Body mass index is 25.95 kg/m.  Increased nutrient needs Nutrition Problem: Increased nutrient needs Etiology: acute illness (diverticulitis) Signs/Symptoms: estimated needs Interventions: Refer to RD note for recommendations   DVT prophylaxis:  enoxaparin (LOVENOX) injection 40 mg  Start: 10/28/22 1615 SCDs Start: 10/27/22 2249  Code Status: Full code Family Communication: None at bedside Level of care: Telemetry Status is: Inpatient Remains inpatient appropriate because: Acute sigmoid diverticulitis with abscess and microperforation   Final  disposition: Home once medically stable Consultants:  General surgery  Sch Meds:  Scheduled Meds:  DULoxetine  60 mg Oral BID   enoxaparin (LOVENOX) injection  40 mg Subcutaneous [id number]   folic acid  1 mg Oral Daily   insulin aspart  0-9 Units Subcutaneous Q4H   metoprolol tartrate  50 mg Oral BID   mirtazapine  15 mg Oral QHS   pantoprazole  40 mg Oral Daily   Continuous Infusions:  lactated ringers 1,000 mL with potassium chloride 30 mEq infusion 100 mL/hr at 10/28/22 1026   piperacillin-tazobactam (ZOSYN)  IV 3.375 g (10/28/22 1023)   PRN Meds:.acetaminophen **OR** acetaminophen, fentaNYL (SUBLIMAZE) injection, methocarbamol, metoprolol tartrate, oxyCODONE  Antimicrobials: Anti-infectives (From admission, onward)    Start     Dose/Rate Route Frequency Ordered Stop   10/28/22 1300  cefTRIAXone (ROCEPHIN) 2 g in sodium chloride 0.9 % 100 mL IVPB  Status:  Discontinued       See Hyperspace for full Linked Orders Report.   2 g 200 mL/hr over 30 Minutes Intravenous Every 24 hours 10/27/22 2220 10/28/22 0915   10/28/22 1015  piperacillin-tazobactam (ZOSYN) IVPB 3.375 g        3.375 g 12.5 mL/hr over 240 Minutes Intravenous Every 8 hours 10/28/22 0916     10/28/22 0200  metroNIDAZOLE (FLAGYL) IVPB 500 mg  Status:  Discontinued       See Hyperspace for full Linked Orders Report.   500 mg 100 mL/hr over 60 Minutes Intravenous Every 12 hours 10/27/22 2220 10/28/22 0915   10/27/22 1300  cefTRIAXone (ROCEPHIN) 2 g in sodium chloride 0.9 % 100 mL IVPB       See Hyperspace for full Linked Orders Report.   2 g 200 mL/hr over 30 Minutes Intravenous  Once 10/27/22 1255 10/27/22 1703   10/27/22 1300  metroNIDAZOLE (FLAGYL) IVPB 500 mg       See Hyperspace for full Linked Orders Report.   500 mg 100 mL/hr over 60 Minutes Intravenous  Once 10/27/22 1255 10/27/22 1703        I have personally reviewed the following labs and images: CBC: Recent Labs  Lab 10/27/22 1033 10/28/22 0008   WBC 10.8* 8.5  NEUTROABS 9.1*  --   HGB 13.4 12.5*  HCT 40.0 38.6*  MCV 82.3 83.9  PLT 293 301   BMP &GFR Recent Labs  Lab 10/27/22 1033 10/28/22 0008  NA 133* 138  K 3.4* 3.0*  CL 97* 106  CO2 23 20*  GLUCOSE 116* 87  BUN 22 18  CREATININE 0.80 0.70  CALCIUM 8.9 8.4*  MG  --  2.3  PHOS  --  3.5   Estimated Creatinine Clearance: 79.6 mL/min (by C-G formula based on SCr of 0.7 mg/dL). Liver & Pancreas: Recent Labs  Lab 10/27/22 1033 10/28/22 0008  AST 20 23  ALT 26 29  ALKPHOS 88 76  BILITOT 0.9 0.8  PROT 6.6 6.4*  ALBUMIN 3.8 3.2*   Recent Labs  Lab 10/27/22 1033  LIPASE 10*   No results for input(s): "AMMONIA" in the last 168 hours. Diabetic: No results for input(s): "HGBA1C" in the last 72 hours. Recent Labs  Lab 10/27/22 2331 10/28/22 0147 10/28/22 0619 10/28/22 0733 10/28/22 1139  GLUCAP 85 79 86  95 83   Cardiac Enzymes: Recent Labs  Lab 10/28/22 0008  CKTOTAL 34*   No results for input(s): "PROBNP" in the last 8760 hours. Coagulation Profile: No results for input(s): "INR", "PROTIME" in the last 168 hours. Thyroid Function Tests: No results for input(s): "TSH", "T4TOTAL", "FREET4", "T3FREE", "THYROIDAB" in the last 72 hours. Lipid Profile: No results for input(s): "CHOL", "HDL", "LDLCALC", "TRIG", "CHOLHDL", "LDLDIRECT" in the last 72 hours. Anemia Panel: No results for input(s): "VITAMINB12", "FOLATE", "FERRITIN", "TIBC", "IRON", "RETICCTPCT" in the last 72 hours. Urine analysis:    Component Value Date/Time   COLORURINE DARK YELLOW 01/08/2021 1517   APPEARANCEUR CLEAR 01/08/2021 1517   APPEARANCEUR Clear 01/18/2018 1045   LABSPEC 1.023 01/08/2021 1517   PHURINE 6.5 01/08/2021 1517   GLUCOSEU NEGATIVE 01/08/2021 1517   HGBUR NEGATIVE 01/08/2021 1517   BILIRUBINUR Negative 01/18/2018 [place] 01/08/2021 1517   PROTEINUR TRACE (A) 01/08/2021 1517   UROBILINOGEN 0.2 09/14/2017 1112   UROBILINOGEN 1.0 01/25/2013  1217   NITRITE NEGATIVE 01/08/2021 1517   LEUKOCYTESUR NEGATIVE 01/08/2021 1517   Sepsis Labs: Invalid input(s): "PROCALCITONIN", "LACTICIDVEN"  Microbiology: No results found for this or any previous visit (from the past 240 hour(s)).  Radiology Studies: DG CHEST PORT 1 VIEW  Result Date: 10/27/2022 CLINICAL DATA:  Preoperative cardiovascular exam. EXAM: PORTABLE CHEST 1 VIEW COMPARISON:  May 05, 2018 FINDINGS: The heart size and mediastinal contours are within normal limits. A coronary artery stent is in place. Low lung volumes are noted. Mild atelectasis is seen within the left lung base. There is no evidence of a pleural effusion or pneumothorax. A left shoulder replacement is noted. The visualized skeletal structures are otherwise unremarkable. IMPRESSION: Low lung volumes with mild left basilar atelectasis. Electronically Signed   By: Virgina Norfolk M.D.   On: 10/27/2022 23:06      Delorese Sellin T. [place]  If 7PM-7AM, please contact night-coverage [email or web address] 10/28/2022, 3:24 PM

## 2022-10-28 NOTE — Inpatient Diabetes Management (Signed)
Inpatient Diabetes Program Recommendations  AACE/ADA: New Consensus Statement on Inpatient Glycemic Control (2015)  Target Ranges:  Prepandial:   less than 140 mg/dL      Peak postprandial:   less than 180 mg/dL (1-2 hours)      Critically ill patients:  140 - 180 mg/dL   Lab Results  Component Value Date   GLUCAP 80 10/28/2022   HGBA1C 6.5 (H) 06/19/2022    Review of Glycemic Control  Diabetes history: DM2 Outpatient Diabetes medications: metformin 500 QD, Pred 3 mg QD Current orders for Inpatient glycemic control: Novolog 0-9 units Q4H  HgbA1C - 6.5% indicating good glycemic control  Inpatient Diabetes Program Recommendations:    Agree with orders.   Follow glucose trends.  Thank you. Lorenda Peck, RD, LDN, [place] Inpatient Diabetes Coordinator [phone]

## 2022-10-28 NOTE — Consult Note (Signed)
David Weaver 11-07-1951  [id number].    Requesting MD: Roel Cluck, MD Chief Complaint/Reason for Consult: diverticulitis with abscess  HPI:  David Weaver is a 71 y/o M with a PMH a.fib on Xarelto (last dose 11/27 AM), CAD s/p PCI to LAD ~3 years ago, CHF, HTN, HLD, lupus/arthritis on plaquenil/methotrexate/prednisone, and chronic pain/fibromyalgia on oxycodone who presents with abdominal pain. He reports the pain started around thanksgiving and was in his suprapubic region and penis, worse with urination. He also had a cough at that time and thought he may have pulled a muscle causing his abdominal pain. He went to urgent care on thanksgiving night and was prescribed augmentin for an upper respiratory infection. At home he continued to have pain, chills, and cold sweats so he came to the ED. He denies similar pain in the past. Denies fever, chest pain, SOB, pyuria, pneumaturia, or blood in his stool. States he has been compliant with colonoscopies and is due for one 2024. States his last colonoscopy was about 4 years ago and he had polyps removed that were benign. Denies a personal or family history of colon cancer, states his brother had gastric cancer. Patient is a former marine.  Substance use: former smoker states he quit 6 years ago, reports drinking 3-4 beers nightly, denies other drug use, other than prescribed oxy.  Surgical Hx: cholecystectomy  ROS: As above  Review of Systems  All other systems reviewed and are negative.   Family History  Problem Relation Age of Onset   Alzheimer's disease Mother    Osteoporosis Mother    Heart disease Father 45       AMI   Alcohol abuse Father    Cancer Brother        colon cancer   Diabetes Brother    Kidney failure Brother     Past Medical History:  Diagnosis Date   Alcohol abuse, daily use    Allergic rhinitis, cause unspecified    Angina pectoris ([place]) 03/26/2020   Atypical mole 10/19/2019   Right Cheek (widershave)   Dysthymic  disorder    Esophageal reflux    Essential hypertension, benign    Fibromyalgia    followed by [name] every six months.   Fibromyositis    Hypertriglyceridemia 07/20/2020   Impotence of organic origin    Insomnia, unspecified    Left knee DJD    Lupus erythematosus    Osteoarthrosis, unspecified whether generalized or localized, lower leg    Other abnormal glucose    Other specified disorder of male genital organs(608.89)    Pure hypercholesterolemia 07/20/2020   S/P coronary artery stent placement 07/20/2020   LAD PCI 03/2020   Systemic lupus erythematosus ([place])    Unspecified hearing loss    Unspecified vitamin D deficiency     Past Surgical History:  Procedure Laterality Date   CARPAL TUNNEL RELEASE     bilateral   CHOLECYSTECTOMY     COLONOSCOPY  03/01/2014   two polyps; Dr. Benson Norway.  Repeat 5 years.   CORONARY STENT INTERVENTION N/A 04/06/2020   Procedure: CORONARY STENT INTERVENTION;  Surgeon: Wellington Hampshire, MD;  Location: [facility];  Service: Cardiovascular;  Laterality: N/A;   GANGLION CYST EXCISION  1980   Left wrist   JOINT REPLACEMENT  01/2013   right knee, left knee. [place].   KNEE ARTHROPLASTY     KNEE ARTHROSCOPY  2008   Right   LEFT HEART CATH AND CORONARY ANGIOGRAPHY N/A 04/06/2020  Procedure: LEFT HEART CATH AND CORONARY ANGIOGRAPHY;  Surgeon: Wellington Hampshire, MD;  Location: [facility];  Service: Cardiovascular;  Laterality: N/A;   lumbar spine epidural  2009   [facility]   REVERSE SHOULDER ARTHROPLASTY Left 06/25/2022   Procedure: REVERSE SHOULDER ARTHROPLASTY;  Surgeon: Hiram Gash, MD;  Location: [facility];  Service: Orthopedics;  Laterality: Left;   ROTATOR CUFF REPAIR  10/2008   right   TOTAL KNEE ARTHROPLASTY  12/27/2012   Procedure: TOTAL KNEE ARTHROPLASTY;  Surgeon: [name], MD;  Location: [facility];  Service: Orthopedics;  Laterality: Right;  RIGHT ARTHROPLASTY KNEE MEDIAL AND LATERAL COMPARTMENTS WITH PATELLA  RESURFACING, RIGHT TOTAL KNEE REPLACEMENT   TOTAL KNEE ARTHROPLASTY Left 01/31/2013   Procedure: TOTAL KNEE ARTHROPLASTY;  Surgeon: [name], MD;  Location: [facility];  Service: Orthopedics;  Laterality: Left;   TOTAL SHOULDER ARTHROPLASTY      Social History:  reports that he quit smoking about 7 years ago. His smoking use included cigarettes. He has a 75.00 pack-year smoking history. He has never used smokeless tobacco. He reports current alcohol use of about 14.0 - 21.0 standard drinks of alcohol per week. He reports that he does not currently use drugs.  Allergies: No Known Allergies  Medications Prior to Admission  Medication Sig Dispense Refill   amLODipine (NORVASC) 5 MG tablet Take 5 mg by mouth daily.     amoxicillin-clavulanate (AUGMENTIN) 875-125 MG tablet Take 1 tablet by mouth 2 (two) times daily.     atorvastatin (LIPITOR) 40 MG tablet Take 1 tablet (40 mg total) by mouth daily. 90 tablet 3   Camphor-Menthol-Methyl Sal (SALONPAS) 3.12-06-08 % PTCH Place 1 patch onto the skin daily as needed (pain).     carboxymethylcellulose (REFRESH PLUS) 0.5 % SOLN Place 1 drop into both eyes 3 (three) times daily as needed (dry eyes).     Cholecalciferol (DIALYVITE VITAMIN D 5000) 125 MCG (5000 UT) capsule Take 5,000 Units by mouth daily.     colestipol (COLESTID) 1 g tablet Take 1 g by mouth 2 (two) times daily.  5   DULoxetine (CYMBALTA) 60 MG capsule Take 1 capsule (60 mg total) 2 (two) times daily by mouth. 180 capsule 3   mirtazapine (REMERON) 15 MG tablet Take 15 mg by mouth at bedtime.      oxyCODONE (OXY IR/ROXICODONE) 5 MG immediate release tablet Take 5 mg by mouth every 6 (six) hours as needed for moderate pain or severe pain.     albuterol (VENTOLIN HFA) 108 (90 Base) MCG/ACT inhaler Inhale into the lungs.     chlorpheniramine (CHLOR-TRIMETON) 4 MG tablet Take 4 mg by mouth 2 (two) times daily as needed for allergies.     folic acid (FOLVITE) 1 MG tablet Take 1 mg by mouth daily.      hydroxychloroquine (PLAQUENIL) 200 MG tablet TAKE 1 TABLET BY MOUTH TWICE A DAY 180 tablet 0   losartan (COZAAR) 100 MG tablet Take 1 tablet (100 mg total) by mouth daily. 90 tablet 3   metFORMIN (GLUCOPHAGE) 500 MG tablet Take 500 mg by mouth daily.     methocarbamol (ROBAXIN) 500 MG tablet Take 1 tablet (500 mg total) by mouth every 8 (eight) hours as needed for muscle spasms. 30 tablet 0   methotrexate (RHEUMATREX) 2.5 MG tablet Take 10 mg by mouth once a week. Caution:Chemotherapy. Protect from light.     metoprolol tartrate (LOPRESSOR) 50 MG tablet Take 50 mg by mouth  2 (two) times daily.     ONETOUCH ULTRA test strip 1 each daily.     OVER THE COUNTER MEDICATION Apply 1 Application topically daily as needed (leg pain). CBD Cream     pantoprazole (PROTONIX) 40 MG tablet TAKE 1 TABLET BY MOUTH DAILY 90 tablet 1   predniSONE (DELTASONE) 1 MG tablet Take 3 mg by mouth daily.     rivaroxaban (XARELTO) 20 MG TABS tablet Take 1 tablet (20 mg total) by mouth daily with supper. 90 tablet 1     Physical Exam: Blood pressure (!) 156/84, pulse 89, temperature 97.7 F (36.5 C), temperature source Oral, resp. rate 20, height '5\' 8"'$  (1.727 m), weight 77.4 kg, SpO2 94 %. General: Pleasant white male laying on hospital bed, appears stated age, NAD. HEENT: head -normocephalic, atraumatic; Eyes: PERRLA, anicteric sclerae Neck- Trachea is midline, no thyromegaly or JVD appreciated.  CV- RRR, no lower extremity edema Pulm- breathing is non-labored ORA  Abd- soft, protuberant, significantly tender to palpation across the lower abdomen, worse over the LLQ and SP regions with guarding, there is no peritonitis  GU- deferred  MSK- UE/LE symmetrical, no cyanosis, clubbing, or edema. Neuro- CN II-XII grossly in tact, gait not assessed  Psych- Alert and Oriented x3 with appropriate affect Skin: warm and dry, no rashes or lesions   Results for orders placed or performed during the hospital encounter of  10/27/22 (from the past 48 hour(s))  CBC with Differential     Status: Abnormal   Collection Time: 10/27/22 10:33 AM  Result Value Ref Range   WBC 10.8 (H) 4.0 - 10.5 K/uL   RBC 4.86 4.22 - 5.81 MIL/uL   Hemoglobin 13.4 13.0 - 17.0 g/dL   HCT 40.0 39.0 - 52.0 %   MCV 82.3 80.0 - 100.0 fL   MCH 27.6 26.0 - 34.0 pg   MCHC 33.5 30.0 - 36.0 g/dL   RDW 16.8 (H) 11.5 - 15.5 %   Platelets 293 150 - 400 K/uL   nRBC 0.0 0.0 - 0.2 %   Neutrophils Relative % 83 %   Neutro Abs 9.1 (H) 1.7 - 7.7 K/uL   Lymphocytes Relative 5 %   Lymphs Abs 0.6 (L) 0.7 - 4.0 K/uL   Monocytes Relative 9 %   Monocytes Absolute 0.9 0.1 - 1.0 K/uL   Eosinophils Relative 2 %   Eosinophils Absolute 0.2 0.0 - 0.5 K/uL   Basophils Relative 0 %   Basophils Absolute 0.0 0.0 - 0.1 K/uL   Immature Granulocytes 1 %   Abs Immature Granulocytes 0.05 0.00 - 0.07 K/uL    Comment: Performed at [facility], [place], [place] [address]  Comprehensive metabolic panel     Status: Abnormal   Collection Time: 10/27/22 10:33 AM  Result Value Ref Range   Sodium 133 (L) 135 - 145 mmol/L   Potassium 3.4 (L) 3.5 - 5.1 mmol/L   Chloride 97 (L) 98 - 111 mmol/L   CO2 23 22 - 32 mmol/L   Glucose, Bld 116 (H) 70 - 99 mg/dL    Comment: Glucose reference range applies only to samples taken after fasting for at least 8 hours.   BUN 22 8 - 23 mg/dL   Creatinine, Ser 0.80 0.61 - 1.24 mg/dL   Calcium 8.9 8.9 - 10.3 mg/dL   Total Protein 6.6 6.5 - 8.1 g/dL   Albumin 3.8 3.5 - 5.0 g/dL   AST 20 15 - 41 U/L   ALT  26 0 - 44 U/L   Alkaline Phosphatase 88 38 - 126 U/L   Total Bilirubin 0.9 0.3 - 1.2 mg/dL   GFR, Estimated >60 >60 mL/min    Comment: (NOTE) Calculated using the CKD-EPI Creatinine Equation (2021)    Anion gap 13 5 - 15    Comment: Performed at [facility], [place], [place] [address]  Lipase, blood     Status: Abnormal   Collection Time: 10/27/22 10:33 AM   Result Value Ref Range   Lipase 10 (L) 11 - 51 U/L    Comment: Performed at [facility], [address] [place], [place] [address]  Glucose, capillary     Status: None   Collection Time: 10/27/22 11:31 PM  Result Value Ref Range   Glucose-Capillary 85 70 - 99 mg/dL    Comment: Glucose reference range applies only to samples taken after fasting for at least 8 hours.  CK     Status: Abnormal   Collection Time: 10/28/22 12:08 AM  Result Value Ref Range   Total CK 34 (L) 49 - 397 U/L    Comment: Performed at [facility], [place] [address], [place], [place] [address]  Lactic acid, plasma     Status: None   Collection Time: 10/28/22 12:08 AM  Result Value Ref Range   Lactic Acid, Venous 0.9 0.5 - 1.9 mmol/L    Comment: Performed at [facility], [place] [address], [place], [place] [address]  Comprehensive metabolic panel     Status: Abnormal   Collection Time: 10/28/22 12:08 AM  Result Value Ref Range   Sodium 138 135 - 145 mmol/L   Potassium 3.0 (L) 3.5 - 5.1 mmol/L   Chloride 106 98 - 111 mmol/L   CO2 20 (L) 22 - 32 mmol/L   Glucose, Bld 87 70 - 99 mg/dL    Comment: Glucose reference range applies only to samples taken after fasting for at least 8 hours.   BUN 18 8 - 23 mg/dL   Creatinine, Ser 0.70 0.61 - 1.24 mg/dL   Calcium 8.4 (L) 8.9 - 10.3 mg/dL   Total Protein 6.4 (L) 6.5 - 8.1 g/dL   Albumin 3.2 (L) 3.5 - 5.0 g/dL   AST 23 15 - 41 U/L   ALT 29 0 - 44 U/L   Alkaline Phosphatase 76 38 - 126 U/L   Total Bilirubin 0.8 0.3 - 1.2 mg/dL   GFR, Estimated >60 >60 mL/min    Comment: (NOTE) Calculated using the CKD-EPI Creatinine Equation (2021)    Anion gap 12 5 - 15    Comment: Performed at [facility], [place] [address] [place], [place] [address]  CBC     Status: Abnormal   Collection Time: 10/28/22 12:08 AM  Result Value Ref Range   WBC 8.5 4.0 - 10.5 K/uL   RBC 4.60 4.22 - 5.81 MIL/uL   Hemoglobin  12.5 (L) 13.0 - 17.0 g/dL   HCT 38.6 (L) 39.0 - 52.0 %   MCV 83.9 80.0 - 100.0 fL   MCH 27.2 26.0 - 34.0 pg   MCHC 32.4 30.0 - 36.0 g/dL   RDW 16.7 (H) 11.5 - 15.5 %   Platelets 301 150 - 400 K/uL   nRBC 0.0 0.0 - 0.2 %    Comment: Performed at [facility], [place] [address], [place], [place] [address]  Magnesium     Status: None   Collection Time: 10/28/22  12:08 AM  Result Value Ref Range   Magnesium 2.3 1.7 - 2.4 mg/dL    Comment: Performed at [facility], [place] [address], [place], [place] [address]  Phosphorus     Status: None   Collection Time: 10/28/22 12:08 AM  Result Value Ref Range   Phosphorus 3.5 2.5 - 4.6 mg/dL    Comment: Performed at [facility], [place] [address], [place], [place] [address]  Glucose, capillary     Status: None   Collection Time: 10/28/22  1:47 AM  Result Value Ref Range   Glucose-Capillary 79 70 - 99 mg/dL    Comment: Glucose reference range applies only to samples taken after fasting for at least 8 hours.  Lactic acid, plasma     Status: None   Collection Time: 10/28/22  5:34 AM  Result Value Ref Range   Lactic Acid, Venous 0.7 0.5 - 1.9 mmol/L    Comment: Performed at [facility], [place] [address], [place], [place] [address]  Glucose, capillary     Status: None   Collection Time: 10/28/22  6:19 AM  Result Value Ref Range   Glucose-Capillary 86 70 - 99 mg/dL    Comment: Glucose reference range applies only to samples taken after fasting for at least 8 hours.  Glucose, capillary     Status: None   Collection Time: 10/28/22  7:33 AM  Result Value Ref Range   Glucose-Capillary 95 70 - 99 mg/dL    Comment: Glucose reference range applies only to samples taken after fasting for at least 8 hours.   DG CHEST PORT 1 VIEW  Result Date: 10/27/2022 CLINICAL DATA:  Preoperative cardiovascular exam. EXAM: PORTABLE CHEST 1 VIEW COMPARISON:  May 05, 2018 FINDINGS: The heart size and  mediastinal contours are within normal limits. A coronary artery stent is in place. Low lung volumes are noted. Mild atelectasis is seen within the left lung base. There is no evidence of a pleural effusion or pneumothorax. A left shoulder replacement is noted. The visualized skeletal structures are otherwise unremarkable. IMPRESSION: Low lung volumes with mild left basilar atelectasis. Electronically Signed   By: Virgina Norfolk M.D.   On: 10/27/2022 23:06   CT ABDOMEN PELVIS W CONTRAST  Result Date: 10/27/2022 CLINICAL DATA:  Abdominal pain, acute, nonlocalized.  Dysuria. EXAM: CT ABDOMEN AND PELVIS WITH CONTRAST TECHNIQUE: Multidetector CT imaging of the abdomen and pelvis was performed using the standard protocol following bolus administration of intravenous contrast. RADIATION DOSE REDUCTION: This exam was performed according to the departmental dose-optimization program which includes automated exposure control, adjustment of the mA and/or kV according to patient size and/or use of iterative reconstruction technique. CONTRAST:  30m OMNIPAQUE IOHEXOL 300 MG/ML  SOLN COMPARISON:  02/15/2018 FINDINGS: Lower chest: Chronic scarring at the medial lung bases. Hepatobiliary: Previous cholecystectomy. Liver parenchyma is normal. Pancreas: Normal Spleen: Normal Adrenals/Urinary Tract: Adrenal glands are normal. Kidneys are normal. No cyst, mass, stone or hydronephrosis. There is probably reactive thickening of the left side of the bladder due to adjacent diverticulitis. Stomach/Bowel: Stomach and small intestine are normal. Right colon is normal. There is advanced diverticulitis affecting the sigmoid colon with regional inflammatory edema throughout the fat, an adjacent abscess measuring 8 x 2.5 x 2.5 cm, and free intraperitoneal air. Left-sided bladder diverticulum also present in that region as seen previously. Vascular/Lymphatic: Aortic atherosclerosis. No aneurysm. IVC is normal. No adenopathy.  Reproductive: Normal Other: None Musculoskeletal: Scoliosis and degenerative change of the spine.  IMPRESSION: 1. Advanced diverticulitis affecting the sigmoid colon with adjacent 8 x 2.5 x 2.5 cm abscess and free intraperitoneal air. 2. Left-sided bladder diverticulum. 3. Aortic atherosclerosis. 4. Scoliosis and degenerative change of the spine. Aortic Atherosclerosis (ICD10-I70.0). Electronically Signed   By: Nelson Chimes M.D.   On: 10/27/2022 12:43      Assessment/Plan Diverticulitis with abscess 71 y/o M with below medical co-morbidities who is admitted for diverticulitis with abscess. He is currently afebrile with stable vitals. WBC 8.5. He is very tender on examination of the lower abdomen but is not peritonitic. His chronic pain and use of prednisone/immunosuppressants could also make his exam somewhat unreliable. There is no indication for emergent surgery at this time. I have reviewed his scan with radiologist, Dr. Maryelizabeth Kaufmann, who states his abscess is not amenable to drainage. I recommend NPO, sips with meds, and IV antibiotics. Hopefully he will improve with non-operative measures. We will follow closely. Please hold his xarelto. Please hold his prednisone. I will re-order his home pain meds.    FEN - NPO, sips with meds, ice chips VTE - SCD's, ok for lovenox ID - Rocepin/Flagyl 11/27 >>, switch to Zosyn today Admit - [facility] service   CAD HTN HLD PAF on xarelto - last dose 11/27, please hold this Daily EtOH use- consider CIWA  Chronic pain - oxy 5 mg at home SLE - followed by rheumatology, on plaquenil, methotrexate, prednisone   I reviewed  nursing notes, ED provider notes, hospitalist notes, last 24 h vitals and pain scores, last 48 h intake and output, last 24 h labs and trends, and last 24 h imaging results.   Jill Alexanders, [facility] 10/28/2022, 8:50 AM Please see Amion for pager number during day hours 7:00am-4:30pm or 7:00am -11:30am on weekends

## 2022-10-28 NOTE — Progress Notes (Signed)
Mobility Specialist - Progress Note   10/28/22 1027  Mobility  Activity Ambulated with assistance in hallway  Level of Assistance Independent after set-up  Assistive Device  (IV Pole)  Distance Ambulated (ft) 500 ft  Activity Response Tolerated well  Mobility Referral Yes  $Mobility charge 1 Mobility   Pt received in bed and agreeable to mobility. No complaints during mobility. Pt to EOB after session with all needs met & Nurse in room.    [facility]

## 2022-10-29 DIAGNOSIS — K5720 Diverticulitis of large intestine with perforation and abscess without bleeding: Secondary | ICD-10-CM | POA: Diagnosis not present

## 2022-10-29 DIAGNOSIS — K5792 Diverticulitis of intestine, part unspecified, without perforation or abscess without bleeding: Secondary | ICD-10-CM | POA: Diagnosis not present

## 2022-10-29 LAB — RENAL FUNCTION PANEL
Albumin: 3.1 g/dL — ABNORMAL LOW (ref 3.5–5.0)
Anion gap: 11 (ref 5–15)
BUN: 8 mg/dL (ref 8–23)
CO2: 21 mmol/L — ABNORMAL LOW (ref 22–32)
Calcium: 8.4 mg/dL — ABNORMAL LOW (ref 8.9–10.3)
Chloride: 108 mmol/L (ref 98–111)
Creatinine, Ser: 0.54 mg/dL — ABNORMAL LOW (ref 0.61–1.24)
GFR, Estimated: >60 mL/min (ref >60)
Glucose, Bld: 75 mg/dL (ref 70–99)
Phosphorus: 2.7 mg/dL (ref 2.5–4.6)
Potassium: 3.3 mmol/L — ABNORMAL LOW (ref 3.5–5.1)
Sodium: 140 mmol/L (ref 135–145)

## 2022-10-29 LAB — CBC
HCT: 38 % — ABNORMAL LOW (ref 39.0–52.0)
Hemoglobin: 12.1 g/dL — ABNORMAL LOW (ref 13.0–17.0)
MCH: 27.1 pg (ref 26.0–34.0)
MCHC: 31.8 g/dL (ref 30.0–36.0)
MCV: 85.2 fL (ref 80.0–100.0)
Platelets: 332 10*3/uL (ref 150–400)
RBC: 4.46 MIL/uL (ref 4.22–5.81)
RDW: 16.3 % — ABNORMAL HIGH (ref 11.5–15.5)
WBC: 8.2 10*3/uL (ref 4.0–10.5)
nRBC: 0 % (ref 0.0–0.2)

## 2022-10-29 LAB — MAGNESIUM: Magnesium: 1.9 mg/dL (ref 1.7–2.4)

## 2022-10-29 LAB — GLUCOSE, CAPILLARY
Glucose-Capillary: 74 mg/dL (ref 70–99)
Glucose-Capillary: 78 mg/dL (ref 70–99)
Glucose-Capillary: 103 mg/dL — ABNORMAL HIGH (ref 70–99)
Glucose-Capillary: 169 mg/dL — ABNORMAL HIGH (ref 70–99)
Glucose-Capillary: 117 mg/dL — ABNORMAL HIGH (ref 70–99)

## 2022-10-29 LAB — PROTIME-INR
INR: 1.2 (ref 0.8–1.2)
Prothrombin Time: 15.3 seconds — ABNORMAL HIGH (ref 11.4–15.2)

## 2022-10-29 LAB — APTT: aPTT: 30 seconds (ref 24–36)

## 2022-10-29 LAB — HEPARIN LEVEL (UNFRACTIONATED): Heparin Unfractionated: <0.1 IU/mL — ABNORMAL LOW (ref 0.30–0.70)

## 2022-10-29 MED ORDER — HEPARIN (PORCINE) 25000 UT/250ML-% IV SOLN
1600.0000 [IU]/h | INTRAVENOUS | Status: DC
  Administered 2022-10-29: 1100 [IU]/h via INTRAVENOUS
  Administered 2022-10-30: 1350 [IU]/h via INTRAVENOUS
  Administered 2022-10-31: 1600 [IU]/h via INTRAVENOUS
  Filled 2022-10-29 (×3): qty 250

## 2022-10-29 MED ORDER — HEPARIN BOLUS VIA INFUSION
2500.0000 [IU] | Freq: Once | INTRAVENOUS | Status: AC
  Administered 2022-10-29: 2500 [IU] via INTRAVENOUS
  Filled 2022-10-29: qty 2500

## 2022-10-29 MED ORDER — POTASSIUM CHLORIDE CRYS ER 20 MEQ PO TBCR
40.0000 meq | EXTENDED_RELEASE_TABLET | Freq: Once | ORAL | Status: AC
  Administered 2022-10-29: 40 meq via ORAL
  Filled 2022-10-29: qty 2

## 2022-10-29 MED ORDER — AMLODIPINE BESYLATE 5 MG PO TABS
5.0000 mg | ORAL_TABLET | Freq: Every day | ORAL | Status: DC
  Administered 2022-10-29 – 2022-10-31 (×3): 5 mg via ORAL
  Filled 2022-10-29 (×3): qty 1

## 2022-10-29 NOTE — Progress Notes (Signed)
ANTICOAGULATION CONSULT NOTE - Initial Consult  Pharmacy Consult for Heparin while Xarelto on hold Indication: atrial fibrillation  No Known Allergies  Patient Measurements: Height: '5\' 8"'$  (172.7 cm) Weight: 77.4 kg (170 lb 10.2 oz) IBW/kg (Calculated) : 68.4 Heparin Dosing Weight: total weight  Vital Signs: Temp: 97.7 F (36.5 C) (11/29 1217) BP: 161/81 (11/29 1217) Pulse Rate: 61 (11/29 1217)  Labs: Recent Labs    10/27/22 1033 10/28/22 0008 10/29/22 0556  HGB 13.4 12.5* 12.1*  HCT 40.0 38.6* 38.0*  PLT 293 301 332  CREATININE 0.80 0.70 0.54*  CKTOTAL  --  34*  --     Estimated Creatinine Clearance: 79.6 mL/min (A) (by C-G formula based on SCr of 0.54 mg/dL (L)).   Medical History: Past Medical History:  Diagnosis Date   Alcohol abuse, daily use    Allergic rhinitis, cause unspecified    Angina pectoris ([place]) 03/26/2020   Atypical mole 10/19/2019   Right Cheek Mindi Slicker)   Dysthymic disorder    Esophageal reflux    Essential hypertension, benign    Fibromyalgia    followed by [name] every six months.   Fibromyositis    Hypertriglyceridemia 07/20/2020   Impotence of organic origin    Insomnia, unspecified    Left knee DJD    Lupus erythematosus    Osteoarthrosis, unspecified whether generalized or localized, lower leg    Other abnormal glucose    Other specified disorder of male genital organs(608.89)    Pure hypercholesterolemia 07/20/2020   S/P coronary artery stent placement 07/20/2020   LAD PCI 03/2020   Systemic lupus erythematosus ([place])    Unspecified hearing loss    Unspecified vitamin D deficiency     Medications:  Scheduled:   amLODipine  5 mg Oral Daily   DULoxetine  60 mg Oral BID   enoxaparin (LOVENOX) injection  40 mg Subcutaneous O13Y   folic acid  1 mg Oral Daily   insulin aspart  0-9 Units Subcutaneous Q4H   metoprolol tartrate  50 mg Oral BID   mirtazapine  15 mg Oral QHS   pantoprazole  40 mg Oral Daily   potassium chloride   40 mEq Oral Once   Infusions:   lactated ringers 1,000 mL with potassium chloride 30 mEq infusion 100 mL/hr at 10/29/22 0527   piperacillin-tazobactam (ZOSYN)  IV 3.375 g (10/29/22 1429)   PRN: acetaminophen **OR** acetaminophen, fentaNYL (SUBLIMAZE) injection, methocarbamol, metoprolol tartrate, oxyCODONE  Assessment: 71 yo male on chronic Xarelto for afib admitted with diverticulitis with with abscess and contained perforation not amenable to IR drainage.  Pharmacy consulted to dose IV heparin while Xarelto on hold for bowel rest.  Last dose of Xarelto taken 11/26 at  21:00, therefore, will titrate heparin using aPTT at this time.  Goal of Therapy:  PTT 66-102 seconds Heparin level 0.3-0.7 units/ml Monitor platelets by anticoagulation protocol: Yes   Plan:  Heparin 2500 units IV bolus Heparin infusion 1100 units/hr Check aPTT in 8hrs Check daily aPTT, heparin level and CBC daily  Peggyann Juba, PharmD, BCPS Pharmacy: [phone] 10/29/2022,2:33 PM

## 2022-10-29 NOTE — Progress Notes (Signed)
Mobility Specialist - Progress Note   10/29/22 0955  Mobility  Activity Ambulated with assistance in hallway  Level of Assistance Modified independent, requires aide device or extra time  Assistive Device  (IV Pole)  Distance Ambulated (ft) 500 ft  Activity Response Tolerated well  Mobility Referral Yes  $Mobility charge 1 Mobility   Pt received EOB sitting up and agreeable to mobility. No complaints during mobility. Pt to EOB after session with all needs met.    [facility]

## 2022-10-29 NOTE — Progress Notes (Signed)
Subjective: CC: Abdominal pain greatly improved. Very minimal pain now in LLQ. Tolerating ice chips without n/v. Passing flatus. Small liquid bm yesterday. No Prn pain meds yesterday or today - has not been taking home oxy '5mg'$  here. Thinks last colonoscopy was ~2020. Denies hx of diverticulitis previously.   Objective: Vital signs in last 24 hours: Temp:  [97.4 F (36.3 C)-98.1 F (36.7 C)] 97.4 F (36.3 C) (11/29 0403) Pulse Rate:  [63-87] 63 (11/29 0403) Resp:  [16-20] 18 (11/29 0403) BP: (109-167)/(71-99) 145/71 (11/29 0403) SpO2:  [95 %-96 %] 95 % (11/29 0403) Last BM Date : 10/28/22  Intake/Output from previous day: 11/28 0701 - 11/29 0700 In: 1192.1 [I.V.:876.1; IV Piggyback:315.9] Out: -  Intake/Output this shift: No intake/output data recorded.  PE: Gen:  Alert, NAD, pleasant Abd: Soft, ND, very mild ttp in the LLQ without rigidity or guarding, +BS Psych: A&Ox3  Lab Results:  Recent Labs    10/28/22 0008 10/29/22 0556  WBC 8.5 8.2  HGB 12.5* 12.1*  HCT 38.6* 38.0*  PLT 301 332   BMET Recent Labs    10/28/22 0008 10/29/22 0556  NA 138 140  K 3.0* 3.3*  CL 106 108  CO2 20* 21*  GLUCOSE 87 75  BUN 18 8  CREATININE 0.70 0.54*  CALCIUM 8.4* 8.4*   PT/INR No results for input(s): "LABPROT", "INR" in the last 72 hours. CMP     Component Value Date/Time   NA 140 10/29/2022 0556   NA 140 10/22/2021 1018   K 3.3 (L) 10/29/2022 0556   CL 108 10/29/2022 0556   CO2 21 (L) 10/29/2022 0556   GLUCOSE 75 10/29/2022 0556   BUN 8 10/29/2022 0556   BUN 16 10/22/2021 1018   CREATININE 0.54 (L) 10/29/2022 0556   CREATININE 0.62 (L) 01/08/2021 1517   CALCIUM 8.4 (L) 10/29/2022 0556   PROT 6.4 (L) 10/28/2022 0008   PROT 6.4 07/30/2020 0824   ALBUMIN 3.1 (L) 10/29/2022 0556   ALBUMIN 4.2 07/30/2020 0824   AST 23 10/28/2022 0008   ALT 29 10/28/2022 0008   ALKPHOS 76 10/28/2022 0008   BILITOT 0.8 10/28/2022 0008   BILITOT 0.5 07/30/2020 0824    GFRNONAA >60 10/29/2022 0556   GFRNONAA 100 01/08/2021 1517   GFRAA 116 01/08/2021 1517   Lipase     Component Value Date/Time   LIPASE 10 (L) 10/27/2022 1033    Studies/Results: DG CHEST PORT 1 VIEW  Result Date: 10/27/2022 CLINICAL DATA:  Preoperative cardiovascular exam. EXAM: PORTABLE CHEST 1 VIEW COMPARISON:  May 05, 2018 FINDINGS: The heart size and mediastinal contours are within normal limits. A coronary artery stent is in place. Low lung volumes are noted. Mild atelectasis is seen within the left lung base. There is no evidence of a pleural effusion or pneumothorax. A left shoulder replacement is noted. The visualized skeletal structures are otherwise unremarkable. IMPRESSION: Low lung volumes with mild left basilar atelectasis. Electronically Signed   By: Virgina Norfolk M.D.   On: 10/27/2022 23:06   CT ABDOMEN PELVIS W CONTRAST  Result Date: 10/27/2022 CLINICAL DATA:  Abdominal pain, acute, nonlocalized.  Dysuria. EXAM: CT ABDOMEN AND PELVIS WITH CONTRAST TECHNIQUE: Multidetector CT imaging of the abdomen and pelvis was performed using the standard protocol following bolus administration of intravenous contrast. RADIATION DOSE REDUCTION: This exam was performed according to the departmental dose-optimization program which includes automated exposure control, adjustment of the mA and/or kV according to patient size and/or  use of iterative reconstruction technique. CONTRAST:  53m OMNIPAQUE IOHEXOL 300 MG/ML  SOLN COMPARISON:  02/15/2018 FINDINGS: Lower chest: Chronic scarring at the medial lung bases. Hepatobiliary: Previous cholecystectomy. Liver parenchyma is normal. Pancreas: Normal Spleen: Normal Adrenals/Urinary Tract: Adrenal glands are normal. Kidneys are normal. No cyst, mass, stone or hydronephrosis. There is probably reactive thickening of the left side of the bladder due to adjacent diverticulitis. Stomach/Bowel: Stomach and small intestine are normal. Right colon is normal.  There is advanced diverticulitis affecting the sigmoid colon with regional inflammatory edema throughout the fat, an adjacent abscess measuring 8 x 2.5 x 2.5 cm, and free intraperitoneal air. Left-sided bladder diverticulum also present in that region as seen previously. Vascular/Lymphatic: Aortic atherosclerosis. No aneurysm. IVC is normal. No adenopathy. Reproductive: Normal Other: None Musculoskeletal: Scoliosis and degenerative change of the spine. IMPRESSION: 1. Advanced diverticulitis affecting the sigmoid colon with adjacent 8 x 2.5 x 2.5 cm abscess and free intraperitoneal air. 2. Left-sided bladder diverticulum. 3. Aortic atherosclerosis. 4. Scoliosis and degenerative change of the spine. Aortic Atherosclerosis (ICD10-I70.0). Electronically Signed   By: MNelson ChimesM.D.   On: 10/27/2022 12:43    Anti-infectives: Anti-infectives (From admission, onward)    Start     Dose/Rate Route Frequency Ordered Stop   10/28/22 1300  cefTRIAXone (ROCEPHIN) 2 g in sodium chloride 0.9 % 100 mL IVPB  Status:  Discontinued       See Hyperspace for full Linked Orders Report.   2 g 200 mL/hr over 30 Minutes Intravenous Every 24 hours 10/27/22 2220 10/28/22 0915   10/28/22 1015  piperacillin-tazobactam (ZOSYN) IVPB 3.375 g        3.375 g 12.5 mL/hr over 240 Minutes Intravenous Every 8 hours 10/28/22 0916     10/28/22 0200  metroNIDAZOLE (FLAGYL) IVPB 500 mg  Status:  Discontinued       See Hyperspace for full Linked Orders Report.   500 mg 100 mL/hr over 60 Minutes Intravenous Every 12 hours 10/27/22 2220 10/28/22 0915   10/27/22 1300  cefTRIAXone (ROCEPHIN) 2 g in sodium chloride 0.9 % 100 mL IVPB       See Hyperspace for full Linked Orders Report.   2 g 200 mL/hr over 30 Minutes Intravenous  Once 10/27/22 1255 10/27/22 1703   10/27/22 1300  metroNIDAZOLE (FLAGYL) IVPB 500 mg       See Hyperspace for full Linked Orders Report.   500 mg 100 mL/hr over 60 Minutes Intravenous  Once 10/27/22 1255 10/27/22  1703        Assessment/Plan Diverticulitis with abscess  - CT w/ diverticulitis affecting the sigmoid colon with adjacent 8 x 2.5 x 2.5 cm abscess. Discussed with radiology and not amenable to IR drainage.  - Afebrile. No tachycardia or hypotension. WBC wnl. Symptoms improved, minimal ttp on exam - no peritonitis. No current indication for emergency surgery  - Cont IV antibiotics - Adv to CLD - Hopefully patient will improve with conservative treatment.  If patient fails to improve they may require repeating imaging, drain placement, or surgical intervention resulting in a colectomy/colostomy.  This was discussed with the patient. - Reports 1st episode of diverticulitis. If patient improves with conservative therapies would recommend colonoscopy in ~4-6 weeks.  - We will follow with you  FEN - CLD, IVF per [facility] VTE - SCD's, Lovenox ID - Rocepin/Flagyl 11/27 - 11/28. Zosyn 11/28 >> WBC 8.2. Afebrile. No tachycardia (on scheduled lopressor) or hypotension.    CAD HTN HLD PAF on xarelto -  last dose 11/27, please cont to hold this Daily EtOH use- consider CIWA  Chronic pain - oxy 5 mg at home SLE - followed by rheumatology, on plaquenil, methotrexate, prednisone   LOS: 2 days    Jillyn Ledger , [facility] Surgery 10/29/2022, 8:09 AM Please see Amion for pager number during day hours 7:00am-4:30pm

## 2022-10-29 NOTE — Progress Notes (Signed)
Mobility Specialist - Progress Note   10/29/22 1513  Mobility  Activity Ambulated with assistance in hallway  Level of Assistance Modified independent, requires aide device or extra time  Assistive Device  (IV Pole)  Distance Ambulated (ft) 500 ft  Activity Response Tolerated well  Mobility Referral Yes  $Mobility charge 1 Mobility   Pt received in bed and agreeable to mobility. No complaints during mobility. Pt to recliner after session with all needs met.      [facility]

## 2022-10-29 NOTE — Progress Notes (Signed)
[facility]                                                                              [name], is a 71 y.o. male, DOB - Mar 31, 1951, PNT:[id number] Admit date - 10/27/2022    Outpatient Primary MD for the patient is [name], [name], MD  LOS - 2  days  Chief Complaint  Patient presents with   Groin Pain       Brief summary   71 year old M with PMH of CAD s/p PCI to LAD, paroxysmal A-fib on Xarelto, diastolic CHF, DM-2, HTN, SLE and alcohol use disorder presenting with LLQ abdominal pain and dysuria and admitted for acute sigmoid diverticulitis with abscess and contained perforation.  Started on IV antibiotics.  General surgery consulted and did not feel abscess is unable to drainage after discussion with IR.  Changed antibiotics to IV Zosyn    Assessment & Plan    Principal Problem:   Acute sigmoid diverticulitis with abscess and contained perforation, first episode -CT showed advanced to sigmoid diverticulitis with adjacent 8x 2.5x 2.5 cm abscess and free intraperitoneal air -General surgery discussed with IR and not Annable to IR drainage -Currently afebrile, no leukocytosis, tachycardia, hypotension, surgery recommended to continue IV antibiotics, conservative treatment -If fails to improve, will require drain placement or surgical intervention -Outpatient colonoscopy in 4 to 6 weeks once acute diverticulitis is resolved -Started on clear liquid diet   Active Problems:   Essential hypertension, benign -BP elevated, stable, continue metoprolol    Paroxysmal atrial fibrillation ([name]) -Rate controlled, currently in NSR, continue metoprolol -Resumed Norvasc 5 mg daily -Xarelto on hold for possible surgical intervention if needed -Will place on IV heparin drip if okay with surgery    Systemic lupus erythematosus ([name]) -Hold methotrexate, Plaquenil and prednisone    Type 2 diabetes mellitus without complication, without long-term current use of  insulin ([name]) -Continue sliding scale insulin, sensitive -Hemoglobin A1c 6.4    Hypokalemia -Potassium 3.3, will replace    Alcohol use disorder -Currently stable, monitor for any withdrawal symptoms  Code Status: Full code DVT Prophylaxis:  enoxaparin (LOVENOX) injection 40 mg Start: 10/28/22 2300 SCDs Start: 10/27/22 2249   Level of Care: Level of care: Telemetry Family Communication:  Disposition Plan:      Remains inpatient appropriate:    Procedures:  None  Consultants:   General surgery  Antimicrobials:   Anti-infectives (From admission, onward)    Start     Dose/Rate Route Frequency Ordered Stop   10/28/22 1300  cefTRIAXone (ROCEPHIN) 2 g in sodium chloride 0.9 % 100 mL IVPB  Status:  Discontinued       See Hyperspace for full Linked Orders Report.   2 g 200 mL/hr over 30 Minutes Intravenous Every 24 hours 10/27/22 2220 10/28/22 0915   10/28/22 1015  piperacillin-tazobactam (ZOSYN) IVPB 3.375 g        3.375 g 12.5 mL/hr over 240 Minutes Intravenous Every 8 hours 10/28/22 0916     10/28/22 0200  metroNIDAZOLE (FLAGYL) IVPB 500 mg  Status:  Discontinued       See Hyperspace for full Linked Orders  Report.   500 mg 100 mL/hr over 60 Minutes Intravenous Every 12 hours 10/27/22 2220 10/28/22 0915   10/27/22 1300  cefTRIAXone (ROCEPHIN) 2 g in sodium chloride 0.9 % 100 mL IVPB       See Hyperspace for full Linked Orders Report.   2 g 200 mL/hr over 30 Minutes Intravenous  Once 10/27/22 1255 10/27/22 1703   10/27/22 1300  metroNIDAZOLE (FLAGYL) IVPB 500 mg       See Hyperspace for full Linked Orders Report.   500 mg 100 mL/hr over 60 Minutes Intravenous  Once 10/27/22 1255 10/27/22 1703          Medications  DULoxetine  60 mg Oral BID   enoxaparin (LOVENOX) injection  40 mg Subcutaneous Z32D   folic acid  1 mg Oral Daily   insulin aspart  0-9 Units Subcutaneous Q4H   metoprolol tartrate  50 mg Oral BID   mirtazapine  15 mg Oral QHS   pantoprazole  40 mg  Oral Daily      Subjective:   David Weaver was seen and examined today.  Feeling hungry otherwise no acute complaints.  Pain in the LLQ abdominal area improving.  No fevers or chills, no nausea or vomiting.   Objective:   Vitals:   10/28/22 1325 10/28/22 2039 10/29/22 0403 10/29/22 1217  BP: (!) 148/99 (!) 167/81 (!) 145/71 (!) 161/81  Pulse: 72 81 63 61  Resp: '18 16 18 18  '$ Temp: 97.8 F (36.6 C) 97.6 F (36.4 C) (!) 97.4 F (36.3 C) 97.7 F (36.5 C)  TempSrc: Oral     SpO2: 95% 95% 95% 96%  Weight:      Height:        Intake/Output Summary (Last 24 hours) at 10/29/2022 1415 Last data filed at 10/29/2022 1000 Gross per 24 hour  Intake 1452.05 ml  Output --  Net 1452.05 ml     Wt Readings from Last 3 Encounters:  10/27/22 77.4 kg  06/25/22 84.2 kg  06/19/22 84.2 kg     Exam General: Alert and oriented x 3, NAD Cardiovascular: S1 S2 auscultated,  RRR Respiratory: Clear to auscultation bilaterally, no wheezing Gastrointestinal: Soft, mild TTP in LLQ, NBS  Ext: no pedal edema bilaterally Neuro: no focal neurological deficits Psych: Normal affect and demeanor, alert and oriented x3     Data Reviewed:  I have personally reviewed following labs    CBC Lab Results  Component Value Date   WBC 8.2 10/29/2022   RBC 4.46 10/29/2022   HGB 12.1 (L) 10/29/2022   HCT 38.0 (L) 10/29/2022   MCV 85.2 10/29/2022   MCH 27.1 10/29/2022   PLT 332 10/29/2022   MCHC 31.8 10/29/2022   RDW 16.3 (H) 10/29/2022   LYMPHSABS 0.6 (L) 10/27/2022   MONOABS 0.9 10/27/2022   EOSABS 0.2 10/27/2022   BASOSABS 0.0 92/42/6834     Last metabolic panel Lab Results  Component Value Date   NA 140 10/29/2022   K 3.3 (L) 10/29/2022   CL 108 10/29/2022   CO2 21 (L) 10/29/2022   BUN 8 10/29/2022   CREATININE 0.54 (L) 10/29/2022   GLUCOSE 75 10/29/2022   GFRNONAA >60 10/29/2022   GFRAA 116 01/08/2021   CALCIUM 8.4 (L) 10/29/2022   PHOS 2.7 10/29/2022   PROT 6.4 (L) 10/28/2022    ALBUMIN 3.1 (L) 10/29/2022   LABGLOB 2.2 07/30/2020   AGRATIO 1.9 07/30/2020   BILITOT 0.8 10/28/2022   ALKPHOS 76 10/28/2022   AST  23 10/28/2022   ALT 29 10/28/2022   ANIONGAP 11 10/29/2022    CBG (last 3)  Recent Labs    10/29/22 0405 10/29/22 0748 10/29/22 1214  GLUCAP 74 78 103*      Coagulation Profile: No results for input(s): "INR", "PROTIME" in the last 168 hours.   Radiology Studies: I have personally reviewed the imaging studies  DG CHEST PORT 1 VIEW  Result Date: 10/27/2022 CLINICAL DATA:  Preoperative cardiovascular exam. EXAM: PORTABLE CHEST 1 VIEW COMPARISON:  May 05, 2018 FINDINGS: The heart size and mediastinal contours are within normal limits. A coronary artery stent is in place. Low lung volumes are noted. Mild atelectasis is seen within the left lung base. There is no evidence of a pleural effusion or pneumothorax. A left shoulder replacement is noted. The visualized skeletal structures are otherwise unremarkable. IMPRESSION: Low lung volumes with mild left basilar atelectasis. Electronically Signed   By: Virgina Norfolk M.D.   On: 10/27/2022 23:06       Odai Wimmer M.D. [facility] 10/29/2022, 2:15 PM  Available via Epic secure chat 7am-7pm After 7 pm, please refer to night coverage provider listed on amion.

## 2022-10-30 DIAGNOSIS — E876 Hypokalemia: Secondary | ICD-10-CM | POA: Diagnosis not present

## 2022-10-30 DIAGNOSIS — K5720 Diverticulitis of large intestine with perforation and abscess without bleeding: Secondary | ICD-10-CM | POA: Diagnosis not present

## 2022-10-30 DIAGNOSIS — K5792 Diverticulitis of intestine, part unspecified, without perforation or abscess without bleeding: Secondary | ICD-10-CM | POA: Diagnosis not present

## 2022-10-30 LAB — GLUCOSE, CAPILLARY
Glucose-Capillary: 105 mg/dL — ABNORMAL HIGH (ref 70–99)
Glucose-Capillary: 112 mg/dL — ABNORMAL HIGH (ref 70–99)
Glucose-Capillary: 140 mg/dL — ABNORMAL HIGH (ref 70–99)
Glucose-Capillary: 172 mg/dL — ABNORMAL HIGH (ref 70–99)
Glucose-Capillary: 98 mg/dL (ref 70–99)
Glucose-Capillary: 99 mg/dL (ref 70–99)

## 2022-10-30 LAB — URINALYSIS, COMPLETE (UACMP) WITH MICROSCOPIC
Bacteria, UA: NONE SEEN
Bilirubin Urine: NEGATIVE
Glucose, UA: NEGATIVE mg/dL
Hgb urine dipstick: NEGATIVE
Ketones, ur: NEGATIVE mg/dL
Leukocytes,Ua: NEGATIVE
Nitrite: NEGATIVE
Protein, ur: NEGATIVE mg/dL
Specific Gravity, Urine: 1.011 (ref 1.005–1.030)
pH: 6 (ref 5.0–8.0)

## 2022-10-30 LAB — BASIC METABOLIC PANEL
Anion gap: 8 (ref 5–15)
BUN: <5 mg/dL — ABNORMAL LOW (ref 8–23)
CO2: 23 mmol/L (ref 22–32)
Calcium: 8.6 mg/dL — ABNORMAL LOW (ref 8.9–10.3)
Chloride: 107 mmol/L (ref 98–111)
Creatinine, Ser: 0.62 mg/dL (ref 0.61–1.24)
GFR, Estimated: >60 mL/min (ref >60)
Glucose, Bld: 109 mg/dL — ABNORMAL HIGH (ref 70–99)
Potassium: 4 mmol/L (ref 3.5–5.1)
Sodium: 138 mmol/L (ref 135–145)

## 2022-10-30 LAB — CBC
HCT: 40 % (ref 39.0–52.0)
Hemoglobin: 12.8 g/dL — ABNORMAL LOW (ref 13.0–17.0)
MCH: 26.9 pg (ref 26.0–34.0)
MCHC: 32 g/dL (ref 30.0–36.0)
MCV: 84.2 fL (ref 80.0–100.0)
Platelets: 361 10*3/uL (ref 150–400)
RBC: 4.75 MIL/uL (ref 4.22–5.81)
RDW: 16.2 % — ABNORMAL HIGH (ref 11.5–15.5)
WBC: 9 10*3/uL (ref 4.0–10.5)
nRBC: 0 % (ref 0.0–0.2)

## 2022-10-30 LAB — HEPARIN LEVEL (UNFRACTIONATED)
Heparin Unfractionated: 0.15 IU/mL — ABNORMAL LOW (ref 0.30–0.70)
Heparin Unfractionated: 0.26 IU/mL — ABNORMAL LOW (ref 0.30–0.70)
Heparin Unfractionated: 0.19 IU/mL — ABNORMAL LOW (ref 0.30–0.70)

## 2022-10-30 LAB — APTT: aPTT: 40 seconds — ABNORMAL HIGH (ref 24–36)

## 2022-10-30 MED ORDER — HEPARIN BOLUS VIA INFUSION
2000.0000 [IU] | Freq: Once | INTRAVENOUS | Status: AC
  Administered 2022-10-30: 2000 [IU] via INTRAVENOUS
  Filled 2022-10-30: qty 2000

## 2022-10-30 MED ORDER — ENSURE MAX PROTEIN PO LIQD
11.0000 [oz_av] | Freq: Two times a day (BID) | ORAL | Status: DC
  Administered 2022-10-30 – 2022-10-31 (×3): 11 [oz_av] via ORAL
  Filled 2022-10-30 (×3): qty 330

## 2022-10-30 NOTE — Progress Notes (Signed)
ANTICOAGULATION CONSULT NOTE - follow up  Pharmacy Consult for Heparin while Xarelto on hold Indication: atrial fibrillation  No Known Allergies  Patient Measurements: Height: '5\' 8"'$  (172.7 cm) Weight: 77.4 kg (170 lb 10.2 oz) IBW/kg (Calculated) : 68.4 Heparin Dosing Weight: total weight  Vital Signs: Temp: 98.7 F (37.1 C) (11/29 2008) Temp Source: Oral (11/29 2008) BP: 151/79 (11/29 2008) Pulse Rate: 70 (11/29 2008)  Labs: Recent Labs    10/27/22 1033 10/28/22 0008 10/29/22 0556 10/29/22 1444 10/30/22 0037  HGB 13.4 12.5* 12.1*  --   --   HCT 40.0 38.6* 38.0*  --   --   PLT 293 301 332  --   --   APTT  --   --   --  30 40*  LABPROT  --   --   --  15.3*  --   INR  --   --   --  1.2  --   HEPARINUNFRC  --   --   --  <0.10* 0.15*  CREATININE 0.80 0.70 0.54*  --   --   CKTOTAL  --  34*  --   --   --      Estimated Creatinine Clearance: 79.6 mL/min (A) (by C-G formula based on SCr of 0.54 mg/dL (L)).   Medical History: Past Medical History:  Diagnosis Date   Alcohol abuse, daily use    Allergic rhinitis, cause unspecified    Angina pectoris ([place]) 03/26/2020   Atypical mole 10/19/2019   Right Cheek Mindi Slicker)   Dysthymic disorder    Esophageal reflux    Essential hypertension, benign    Fibromyalgia    followed by [name] every six months.   Fibromyositis    Hypertriglyceridemia 07/20/2020   Impotence of organic origin    Insomnia, unspecified    Left knee DJD    Lupus erythematosus    Osteoarthrosis, unspecified whether generalized or localized, lower leg    Other abnormal glucose    Other specified disorder of male genital organs(608.89)    Pure hypercholesterolemia 07/20/2020   S/P coronary artery stent placement 07/20/2020   LAD PCI 03/2020   Systemic lupus erythematosus ([place])    Unspecified hearing loss    Unspecified vitamin D deficiency     Medications:  Scheduled:   amLODipine  5 mg Oral Daily   DULoxetine  60 mg Oral BID   folic acid   1 mg Oral Daily   insulin aspart  0-9 Units Subcutaneous Q4H   metoprolol tartrate  50 mg Oral BID   mirtazapine  15 mg Oral QHS   pantoprazole  40 mg Oral Daily   Infusions:   heparin 1,100 Units/hr (10/29/22 1613)   lactated ringers 1,000 mL with potassium chloride 30 mEq infusion 100 mL/hr at 10/29/22 1702   piperacillin-tazobactam (ZOSYN)  IV 3.375 g (10/29/22 2123)   PRN: acetaminophen **OR** acetaminophen, fentaNYL (SUBLIMAZE) injection, methocarbamol, metoprolol tartrate, oxyCODONE  Assessment: 71 yo male on chronic Xarelto for afib admitted with diverticulitis with with abscess and contained perforation not amenable to IR drainage.  Pharmacy consulted to dose IV heparin while Xarelto on hold for bowel rest.  Last dose of Xarelto taken 11/26 at  21:00, therefore, will titrate heparin using aPTT at this time.  10/30/2022 aPTT 40, HL 0.15 both subtherapeutic on 1100 units/hr Hgb 12.8, plts WNL No bleeding reported  Goal of Therapy:  PTT 66-102 seconds Heparin level 0.3-0.7 units/ml Monitor platelets by anticoagulation protocol: Yes   Plan:  Heparin  2000 units IV bolus Increase Heparin infusion to 1350 units/hr Heparin and aPTT correlating will use only heparin levels going forward Check heparin level in 8 hours Check daily heparin level and CBC daily  Dolly Rias RPh 10/30/2022, 1:22 AM

## 2022-10-30 NOTE — Progress Notes (Addendum)
Subjective: CC: Abdominal pain improved. Intermittent mild llq pain still that is not related to po intake or using the bathroom. No abdominal pain currently. Tolerating cld without n/v. Passing flatus. Small liquid bm yesterday. Reports dysuria. No flecks of stool in urine or pneumaturia.   Objective: Vital signs in last 24 hours: Temp:  [97.7 F (36.5 C)-98.7 F (37.1 C)] 98.2 F (36.8 C) (11/30 0349) Pulse Rate:  [61-70] 66 (11/30 0349) Resp:  [18-19] 19 (11/30 0349) BP: (144-161)/(79-81) 144/79 (11/30 0349) SpO2:  [95 %-99 %] 99 % (11/30 0349) Last BM Date : 10/29/22  Intake/Output from previous day: 11/29 0701 - 11/30 0700 In: 3181.7 [P.O.:760; I.V.:2299.8; IV Piggyback:121.9] Out: -  Intake/Output this shift: No intake/output data recorded.  PE: Gen:  Alert, NAD, pleasant Abd: Soft, ND, completely NT. No rigidity or guarding, +BS Psych: A&Ox3  Lab Results:  Recent Labs    10/29/22 0556 10/30/22 0037  WBC 8.2 9.0  HGB 12.1* 12.8*  HCT 38.0* 40.0  PLT 332 361    BMET Recent Labs    10/29/22 0556 10/30/22 0037  NA 140 138  K 3.3* 4.0  CL 108 107  CO2 21* 23  GLUCOSE 75 109*  BUN 8 <5*  CREATININE 0.54* 0.62  CALCIUM 8.4* 8.6*    PT/INR Recent Labs    10/29/22 1444  LABPROT 15.3*  INR 1.2   CMP     Component Value Date/Time   NA 138 10/30/2022 0037   NA 140 10/22/2021 1018   K 4.0 10/30/2022 0037   CL 107 10/30/2022 0037   CO2 23 10/30/2022 0037   GLUCOSE 109 (H) 10/30/2022 0037   BUN <5 (L) 10/30/2022 0037   BUN 16 10/22/2021 1018   CREATININE 0.62 10/30/2022 0037   CREATININE 0.62 (L) 01/08/2021 1517   CALCIUM 8.6 (L) 10/30/2022 0037   PROT 6.4 (L) 10/28/2022 0008   PROT 6.4 07/30/2020 0824   ALBUMIN 3.1 (L) 10/29/2022 0556   ALBUMIN 4.2 07/30/2020 0824   AST 23 10/28/2022 0008   ALT 29 10/28/2022 0008   ALKPHOS 76 10/28/2022 0008   BILITOT 0.8 10/28/2022 0008   BILITOT 0.5 07/30/2020 0824   GFRNONAA >60 10/30/2022 0037    GFRNONAA 100 01/08/2021 1517   GFRAA 116 01/08/2021 1517   Lipase     Component Value Date/Time   LIPASE 10 (L) 10/27/2022 1033    Studies/Results: No results found.  Anti-infectives: Anti-infectives (From admission, onward)    Start     Dose/Rate Route Frequency Ordered Stop   10/28/22 1300  cefTRIAXone (ROCEPHIN) 2 g in sodium chloride 0.9 % 100 mL IVPB  Status:  Discontinued       See Hyperspace for full Linked Orders Report.   2 g 200 mL/hr over 30 Minutes Intravenous Every 24 hours 10/27/22 2220 10/28/22 0915   10/28/22 1015  piperacillin-tazobactam (ZOSYN) IVPB 3.375 g        3.375 g 12.5 mL/hr over 240 Minutes Intravenous Every 8 hours 10/28/22 0916     10/28/22 0200  metroNIDAZOLE (FLAGYL) IVPB 500 mg  Status:  Discontinued       See Hyperspace for full Linked Orders Report.   500 mg 100 mL/hr over 60 Minutes Intravenous Every 12 hours 10/27/22 2220 10/28/22 0915   10/27/22 1300  cefTRIAXone (ROCEPHIN) 2 g in sodium chloride 0.9 % 100 mL IVPB       See Hyperspace for full Linked Orders Report.   2  g 200 mL/hr over 30 Minutes Intravenous  Once 10/27/22 1255 10/27/22 1703   10/27/22 1300  metroNIDAZOLE (FLAGYL) IVPB 500 mg       See Hyperspace for full Linked Orders Report.   500 mg 100 mL/hr over 60 Minutes Intravenous  Once 10/27/22 1255 10/27/22 1703        Assessment/Plan Diverticulitis with abscess  - CT w/ diverticulitis affecting the sigmoid colon with adjacent 8 x 2.5 x 2.5 cm abscess. Discussed with radiology and not amenable to IR drainage.  - Afebrile. No tachycardia or hypotension. WBC wnl. Symptoms improved, completely NT. No current indication for emergency surgery. Will advance diet as tolerated. Cont antibiotics. If continues to improve with conservative management, hopefully home in am on oral abx.  - Reports 1st episode of diverticulitis. If patient improves with conservative therapies would recommend colonoscopy in ~4-6 weeks.  - We will follow  with you  FEN - FLD, ADAT, IVF per [facility] VTE - SCD's, Heparin gtt ID - Rocepin/Flagyl 11/27 - 11/28. Zosyn 11/28 >>    Dysuria - UA CAD HTN HLD PAF on xarelto - last dose 11/27, please cont to hold this Daily EtOH use- consider CIWA  Chronic pain - oxy 5 mg at home SLE - followed by rheumatology, on plaquenil, methotrexate, prednisone   LOS: 3 days    Jillyn Ledger , [facility] Surgery 10/30/2022, 8:18 AM Please see Amion for pager number during day hours 7:00am-4:30pm

## 2022-10-30 NOTE — Progress Notes (Signed)
ANTICOAGULATION CONSULT NOTE - Follow Up Consult  Pharmacy Consult for Heparin Indication: atrial fibrillation  No Known Allergies  Patient Measurements: Height: '5\' 8"'$  (172.7 cm) Weight: 77.4 kg (170 lb 10.2 oz) IBW/kg (Calculated) : 68.4 Heparin Dosing Weight: 77.4 kg  Vital Signs: Temp: 97.5 F (36.4 C) (11/30 2055) Temp Source: Oral (11/30 2055) BP: 125/78 (11/30 2055) Pulse Rate: 68 (11/30 2055)  Labs: Recent Labs    10/28/22 0008 10/29/22 0556 10/29/22 1444 10/29/22 1444 10/30/22 0037 10/30/22 1031 10/30/22 2104  HGB 12.5* 12.1*  --   --  12.8*  --   --   HCT 38.6* 38.0*  --   --  40.0  --   --   PLT 301 332  --   --  361  --   --   APTT  --   --  30  --  40*  --   --   LABPROT  --   --  15.3*  --   --   --   --   INR  --   --  1.2  --   --   --   --   HEPARINUNFRC  --   --  <0.10*   < > 0.15* 0.26* 0.19*  CREATININE 0.70 0.54*  --   --  0.62  --   --   CKTOTAL 34*  --   --   --   --   --   --    < > = values in this interval not displayed.    Estimated Creatinine Clearance: 79.6 mL/min (by C-G formula based on SCr of 0.62 mg/dL).   Assessment:  AC/Heme: PTA Xarelto for hx Afib on hold - Hep level 0.26>>0.19 even lower  Goal of Therapy:  Heparin level 0.3-0.7 units/ml Monitor platelets by anticoagulation protocol: Yes   Plan:  Re-bolus heparin 2000 units Increase infusion to 1600 units/hr Recheck HL and CBC in AM   Shanessa Hodak S. Alford Highland, PharmD, BCPS Clinical Staff Pharmacist [email or web address] Alford Highland, Sanaia Jasso Stillinger 10/30/2022,9:55 PM

## 2022-10-30 NOTE — Progress Notes (Signed)
ANTICOAGULATION CONSULT NOTE - follow up  Pharmacy Consult for Heparin while Xarelto on hold Indication: atrial fibrillation  No Known Allergies  Patient Measurements: Height: '5\' 8"'$  (172.7 cm) Weight: 77.4 kg (170 lb 10.2 oz) IBW/kg (Calculated) : 68.4 Heparin Dosing Weight: total weight  Vital Signs: Temp: 98.2 F (36.8 C) (11/30 0349) Temp Source: Oral (11/30 0349) BP: 143/73 (11/30 1024) Pulse Rate: 79 (11/30 1024)  Labs: Recent Labs    10/28/22 0008 10/29/22 0556 10/29/22 1444 10/30/22 0037 10/30/22 1031  HGB 12.5* 12.1*  --  12.8*  --   HCT 38.6* 38.0*  --  40.0  --   PLT 301 332  --  361  --   APTT  --   --  30 40*  --   LABPROT  --   --  15.3*  --   --   INR  --   --  1.2  --   --   HEPARINUNFRC  --   --  <0.10* 0.15* 0.26*  CREATININE 0.70 0.54*  --  0.62  --   CKTOTAL 34*  --   --   --   --      Estimated Creatinine Clearance: 79.6 mL/min (by C-G formula based on SCr of 0.62 mg/dL).   Medications:  Scheduled:   amLODipine  5 mg Oral Daily   DULoxetine  60 mg Oral BID   folic acid  1 mg Oral Daily   insulin aspart  0-9 Units Subcutaneous Q4H   metoprolol tartrate  50 mg Oral BID   mirtazapine  15 mg Oral QHS   pantoprazole  40 mg Oral Daily   Ensure Max Protein  11 oz Oral BID   Infusions:   heparin 1,350 Units/hr (10/30/22 1021)   lactated ringers 1,000 mL with potassium chloride 30 mEq infusion 100 mL/hr at 10/30/22 0514   piperacillin-tazobactam (ZOSYN)  IV 3.375 g (10/30/22 0517)   PRN: acetaminophen **OR** acetaminophen, fentaNYL (SUBLIMAZE) injection, methocarbamol, metoprolol tartrate, oxyCODONE  Assessment: 71 yo male on chronic Xarelto for afib admitted with diverticulitis with with abscess and contained perforation not amenable to IR drainage.  Pharmacy consulted to dose IV heparin while Xarelto on hold for bowel rest.  Last dose of Xarelto taken 11/26 at  21:00.  10/30/2022 Heparin level 0.26, subtherapeutic on 1350 units/hr Per RN,  drip was paused ~30 minutes this AM which could account for low level No interruptions or pauses in infusion CBC: Hgb stable at 12.8, plts WNL No bleeding reported  Goal of Therapy:  Heparin level 0.3-0.7 units/ml Monitor platelets by anticoagulation protocol: Yes   Plan:  Increase Heparin infusion slightly to 1400 units/hr Check heparin level in 8 hours Check daily heparin level and CBC daily Follow up for transition back to [place], PharmD, [place]: [phone] 10/30/2022, 11:20 AM

## 2022-10-30 NOTE — Progress Notes (Signed)
[facility]                                                                              David Weaver, is a 71 y.o. male, DOB - 1951/01/02, GUR:[id number] Admit date - 10/27/2022    Outpatient Primary MD for the patient is [name], [name], MD  LOS - 3  days  Chief Complaint  Patient presents with   Groin Pain       Brief summary   71 year old M with PMH of CAD s/p PCI to LAD, paroxysmal A-fib on Xarelto, diastolic CHF, DM-2, HTN, SLE and alcohol use disorder presenting with LLQ abdominal pain and dysuria and admitted for acute sigmoid diverticulitis with abscess and contained perforation.  Started on IV antibiotics.  General surgery consulted and did not feel abscess is unable to drainage after discussion with IR.  Changed antibiotics to IV Zosyn    Assessment & Plan    Principal Problem:   Acute sigmoid diverticulitis with abscess and contained perforation, first episode -CT showed advanced to sigmoid diverticulitis with adjacent 8x 2.5x 2.5 cm abscess and free intraperitoneal air -General surgery discussed with IR and not amenable to IR drainage -Improving, continue conservative management, tolerating clear liquid diet, advance to full liquids today -Continue IV antibiotics, hopefully home in a.m. on oral antibiotics if tolerating diet -Outpatient colonoscopy in 4 to 6 weeks once acute diverticulitis is resolved   Active Problems:   Essential hypertension, benign -BP stable, continue metoprolol, Norvasc    Paroxysmal atrial fibrillation ([name]) -Rate controlled, NSR, continue metoprolol.  -Xarelto on hold for possible surgical intervention if needed -Continue IV heparin today, will resume Xarelto upon discharge    Systemic lupus erythematosus ([name]) -Hold methotrexate, Plaquenil and prednisone    Type 2 diabetes mellitus without complication, without long-term current use of insulin ([name]) -Continue sliding scale insulin, sensitive -Hemoglobin A1c 6.4     Hypokalemia -Resolved    Alcohol use disorder -Currently stable, monitor for any withdrawal symptoms  Code Status: Full code DVT Prophylaxis:  SCDs Start: 10/27/22 2249   Level of Care: Level of care: Telemetry Family Communication: Updated patient's wife at the bedside Disposition Plan:      Remains inpatient appropriate:    Procedures:  None  Consultants:   General surgery  Antimicrobials:   Anti-infectives (From admission, onward)    Start     Dose/Rate Route Frequency Ordered Stop   10/28/22 1300  cefTRIAXone (ROCEPHIN) 2 g in sodium chloride 0.9 % 100 mL IVPB  Status:  Discontinued       See Hyperspace for full Linked Orders Report.   2 g 200 mL/hr over 30 Minutes Intravenous Every 24 hours 10/27/22 2220 10/28/22 0915   10/28/22 1015  piperacillin-tazobactam (ZOSYN) IVPB 3.375 g        3.375 g 12.5 mL/hr over 240 Minutes Intravenous Every 8 hours 10/28/22 0916     10/28/22 0200  metroNIDAZOLE (FLAGYL) IVPB 500 mg  Status:  Discontinued       See Hyperspace for full Linked Orders Report.   500 mg 100 mL/hr over 60 Minutes Intravenous Every 12 hours 10/27/22 2220 10/28/22  0915   10/27/22 1300  cefTRIAXone (ROCEPHIN) 2 g in sodium chloride 0.9 % 100 mL IVPB       See Hyperspace for full Linked Orders Report.   2 g 200 mL/hr over 30 Minutes Intravenous  Once 10/27/22 1255 10/27/22 1703   10/27/22 1300  metroNIDAZOLE (FLAGYL) IVPB 500 mg       See Hyperspace for full Linked Orders Report.   500 mg 100 mL/hr over 60 Minutes Intravenous  Once 10/27/22 1255 10/27/22 1703          Medications  amLODipine  5 mg Oral Daily   DULoxetine  60 mg Oral BID   folic acid  1 mg Oral Daily   insulin aspart  0-9 Units Subcutaneous Q4H   metoprolol tartrate  50 mg Oral BID   mirtazapine  15 mg Oral QHS   pantoprazole  40 mg Oral Daily   Ensure Max Protein  11 oz Oral BID      Subjective:   David Weaver was seen and examined today.  Feeling better, tolerating clear  liquid diet, ambulating in the hallway without any difficulty.  States LLQ pain is much improved, no nausea vomiting or fevers.   Objective:   Vitals:   10/29/22 1217 10/29/22 2008 10/30/22 0349 10/30/22 1024  BP: (!) 161/81 (!) 151/79 (!) 144/79 (!) 143/73  Pulse: 61 70 66 79  Resp: '18 19 19   '$ Temp: 97.7 F (36.5 C) 98.7 F (37.1 C) 98.2 F (36.8 C)   TempSrc:  Oral Oral   SpO2: 96% 95% 99%   Weight:      Height:        Intake/Output Summary (Last 24 hours) at 10/30/2022 1302 Last data filed at 10/30/2022 0600 Gross per 24 hour  Intake 2821.73 ml  Output --  Net 2821.73 ml     Wt Readings from Last 3 Encounters:  10/27/22 77.4 kg  06/25/22 84.2 kg  06/19/22 84.2 kg    Physical Exam General: Alert and oriented x 3, NAD Cardiovascular: S1 S2 clear, RRR.  Respiratory: CTAB, no wheezing, rales or rhonchi Gastrointestinal: Soft, nontender, nondistended, NBS Ext: no pedal edema bilaterally Neuro: no new deficits Psych: Normal affect    Data Reviewed:  I have personally reviewed following labs    CBC Lab Results  Component Value Date   WBC 9.0 10/30/2022   RBC 4.75 10/30/2022   HGB 12.8 (L) 10/30/2022   HCT 40.0 10/30/2022   MCV 84.2 10/30/2022   MCH 26.9 10/30/2022   PLT 361 10/30/2022   MCHC 32.0 10/30/2022   RDW 16.2 (H) 10/30/2022   LYMPHSABS 0.6 (L) 10/27/2022   MONOABS 0.9 10/27/2022   EOSABS 0.2 10/27/2022   BASOSABS 0.0 01/60/1093     Last metabolic panel Lab Results  Component Value Date   NA 138 10/30/2022   K 4.0 10/30/2022   CL 107 10/30/2022   CO2 23 10/30/2022   BUN <5 (L) 10/30/2022   CREATININE 0.62 10/30/2022   GLUCOSE 109 (H) 10/30/2022   GFRNONAA >60 10/30/2022   GFRAA 116 01/08/2021   CALCIUM 8.6 (L) 10/30/2022   PHOS 2.7 10/29/2022   PROT 6.4 (L) 10/28/2022   ALBUMIN 3.1 (L) 10/29/2022   LABGLOB 2.2 07/30/2020   AGRATIO 1.9 07/30/2020   BILITOT 0.8 10/28/2022   ALKPHOS 76 10/28/2022   AST 23 10/28/2022   ALT 29  10/28/2022   ANIONGAP 8 10/30/2022    CBG (last 3)  Recent Labs  10/30/22 0349 10/30/22 0750 10/30/22 1136  GLUCAP 105* 112* 172*      Coagulation Profile: Recent Labs  Lab 10/29/22 1444  INR 1.2     Radiology Studies: I have personally reviewed the imaging studies  No results found.     Estill Cotta M.D. [facility] 10/30/2022, 1:02 PM  Available via Epic secure chat 7am-7pm After 7 pm, please refer to night coverage provider listed on amion.

## 2022-10-30 NOTE — Progress Notes (Signed)
Mobility Specialist - Progress Note   10/30/22 1342  Mobility  Activity Ambulated with assistance in hallway  Level of Assistance Independent after set-up  Assistive Device  (IV Pole)  Distance Ambulated (ft) 500 ft  Activity Response Tolerated well  Mobility Referral Yes  $Mobility charge 1 Mobility   Pt received in bathroom and agreeable to mobility. No complaints during mobility. Pt to room standing after session with all needs met.      [facility]

## 2022-10-30 NOTE — Care Management Important Message (Signed)
Important Message  Patient Details IM Letter given. Name: David Weaver MRN: [id number] Date of Birth: 1951-06-19   Medicare Important Message Given:  Yes     Kerin Salen 10/30/2022, 10:02 AM

## 2022-10-30 NOTE — Progress Notes (Signed)
Mobility Specialist - Progress Note   10/30/22 0909  Mobility  Activity Ambulated with assistance in hallway  Level of Assistance Independent after set-up  Assistive Device  (IV Pole)  Distance Ambulated (ft) 500 ft  Activity Response Tolerated well  Mobility Referral Yes  $Mobility charge 1 Mobility   Pt received in bed and agreeable to mobility. No complaints during mobility. Pt to bed after session with all needs met, call bell in reach & awaiting breakfast tray.      [facility]

## 2022-10-31 DIAGNOSIS — K5792 Diverticulitis of intestine, part unspecified, without perforation or abscess without bleeding: Secondary | ICD-10-CM | POA: Diagnosis not present

## 2022-10-31 DIAGNOSIS — I480 Paroxysmal atrial fibrillation: Secondary | ICD-10-CM | POA: Diagnosis not present

## 2022-10-31 DIAGNOSIS — K5720 Diverticulitis of large intestine with perforation and abscess without bleeding: Secondary | ICD-10-CM | POA: Diagnosis not present

## 2022-10-31 DIAGNOSIS — E119 Type 2 diabetes mellitus without complications: Secondary | ICD-10-CM | POA: Diagnosis not present

## 2022-10-31 LAB — BASIC METABOLIC PANEL
Anion gap: 8 (ref 5–15)
BUN: 7 mg/dL — ABNORMAL LOW (ref 8–23)
CO2: 24 mmol/L (ref 22–32)
Calcium: 8.9 mg/dL (ref 8.9–10.3)
Chloride: 109 mmol/L (ref 98–111)
Creatinine, Ser: 0.67 mg/dL (ref 0.61–1.24)
GFR, Estimated: >60 mL/min (ref >60)
Glucose, Bld: 120 mg/dL — ABNORMAL HIGH (ref 70–99)
Potassium: 3.9 mmol/L (ref 3.5–5.1)
Sodium: 141 mmol/L (ref 135–145)

## 2022-10-31 LAB — CBC
HCT: 38.8 % — ABNORMAL LOW (ref 39.0–52.0)
Hemoglobin: 12.5 g/dL — ABNORMAL LOW (ref 13.0–17.0)
MCH: 27.2 pg (ref 26.0–34.0)
MCHC: 32.2 g/dL (ref 30.0–36.0)
MCV: 84.3 fL (ref 80.0–100.0)
Platelets: 366 10*3/uL (ref 150–400)
RBC: 4.6 MIL/uL (ref 4.22–5.81)
RDW: 16.5 % — ABNORMAL HIGH (ref 11.5–15.5)
WBC: 7.9 10*3/uL (ref 4.0–10.5)
nRBC: 0 % (ref 0.0–0.2)

## 2022-10-31 LAB — URINE CULTURE: Culture: NO GROWTH

## 2022-10-31 LAB — GLUCOSE, CAPILLARY
Glucose-Capillary: 104 mg/dL — ABNORMAL HIGH (ref 70–99)
Glucose-Capillary: 120 mg/dL — ABNORMAL HIGH (ref 70–99)
Glucose-Capillary: 120 mg/dL — ABNORMAL HIGH (ref 70–99)
Glucose-Capillary: 122 mg/dL — ABNORMAL HIGH (ref 70–99)

## 2022-10-31 LAB — HEPARIN LEVEL (UNFRACTIONATED): Heparin Unfractionated: 0.41 IU/mL (ref 0.30–0.70)

## 2022-10-31 MED ORDER — AMOXICILLIN-POT CLAVULANATE 875-125 MG PO TABS: 1.0000 | ORAL_TABLET | Freq: Two times a day (BID) | ORAL | 0 refills | Status: AC

## 2022-10-31 MED ORDER — RIVAROXABAN 20 MG PO TABS
20.0000 mg | ORAL_TABLET | Freq: Every day | ORAL | Status: DC
  Administered 2022-10-31: 20 mg via ORAL
  Filled 2022-10-31: qty 1

## 2022-10-31 MED ORDER — ONDANSETRON HCL 4 MG PO TABS: 4.0000 mg | ORAL_TABLET | Freq: Three times a day (TID) | ORAL | 0 refills | Status: AC | PRN

## 2022-10-31 NOTE — Progress Notes (Signed)
Subjective: CC: Abdominal pain has resolved. Tolerating fld without n/v. More formed bm yesterday.   Objective: Vital signs in last 24 hours: Temp:  [97.5 F (36.4 C)-98.5 F (36.9 C)] 98.5 F (36.9 C) (12/01 0425) Pulse Rate:  [64-79] 65 (12/01 0425) Resp:  [16-18] 16 (12/01 0425) BP: (121-143)/(73-82) 143/82 (12/01 0425) SpO2:  [94 %-95 %] 94 % (12/01 0425) Last BM Date : 10/29/22  Intake/Output from previous day: 11/30 0701 - 12/01 0700 In: 2233.8 [I.V.:2087.5; IV Piggyback:146.3] Out: -  Intake/Output this shift: No intake/output data recorded.  PE: Gen:  Alert, NAD, pleasant Abd: Soft, ND, completely NT. No rigidity or guarding, +BS Psych: A&Ox3  Lab Results:  Recent Labs    10/30/22 0037 10/31/22 0500  WBC 9.0 7.9  HGB 12.8* 12.5*  HCT 40.0 38.8*  PLT 361 366    BMET Recent Labs    10/30/22 0037 10/31/22 0500  NA 138 141  K 4.0 3.9  CL 107 109  CO2 23 24  GLUCOSE 109* 120*  BUN <5* 7*  CREATININE 0.62 0.67  CALCIUM 8.6* 8.9    PT/INR Recent Labs    10/29/22 1444  LABPROT 15.3*  INR 1.2    CMP     Component Value Date/Time   NA 141 10/31/2022 0500   NA 140 10/22/2021 1018   K 3.9 10/31/2022 0500   CL 109 10/31/2022 0500   CO2 24 10/31/2022 0500   GLUCOSE 120 (H) 10/31/2022 0500   BUN 7 (L) 10/31/2022 0500   BUN 16 10/22/2021 1018   CREATININE 0.67 10/31/2022 0500   CREATININE 0.62 (L) 01/08/2021 1517   CALCIUM 8.9 10/31/2022 0500   PROT 6.4 (L) 10/28/2022 0008   PROT 6.4 07/30/2020 0824   ALBUMIN 3.1 (L) 10/29/2022 0556   ALBUMIN 4.2 07/30/2020 0824   AST 23 10/28/2022 0008   ALT 29 10/28/2022 0008   ALKPHOS 76 10/28/2022 0008   BILITOT 0.8 10/28/2022 0008   BILITOT 0.5 07/30/2020 0824   GFRNONAA >60 10/31/2022 0500   GFRNONAA 100 01/08/2021 1517   GFRAA 116 01/08/2021 1517   Lipase     Component Value Date/Time   LIPASE 10 (L) 10/27/2022 1033    Studies/Results: No results  found.  Anti-infectives: Anti-infectives (From admission, onward)    Start     Dose/Rate Route Frequency Ordered Stop   10/28/22 1300  cefTRIAXone (ROCEPHIN) 2 g in sodium chloride 0.9 % 100 mL IVPB  Status:  Discontinued       See Hyperspace for full Linked Orders Report.   2 g 200 mL/hr over 30 Minutes Intravenous Every 24 hours 10/27/22 2220 10/28/22 0915   10/28/22 1015  piperacillin-tazobactam (ZOSYN) IVPB 3.375 g        3.375 g 12.5 mL/hr over 240 Minutes Intravenous Every 8 hours 10/28/22 0916     10/28/22 0200  metroNIDAZOLE (FLAGYL) IVPB 500 mg  Status:  Discontinued       See Hyperspace for full Linked Orders Report.   500 mg 100 mL/hr over 60 Minutes Intravenous Every 12 hours 10/27/22 2220 10/28/22 0915   10/27/22 1300  cefTRIAXone (ROCEPHIN) 2 g in sodium chloride 0.9 % 100 mL IVPB       See Hyperspace for full Linked Orders Report.   2 g 200 mL/hr over 30 Minutes Intravenous  Once 10/27/22 1255 10/27/22 1703   10/27/22 1300  metroNIDAZOLE (FLAGYL) IVPB 500 mg       See Hyperspace for  full Linked Orders Report.   500 mg 100 mL/hr over 60 Minutes Intravenous  Once 10/27/22 1255 10/27/22 1703        Assessment/Plan Diverticulitis with abscess  - CT w/ diverticulitis affecting the sigmoid colon with adjacent 8 x 2.5 x 2.5 cm abscess. Discussed with radiology and not amenable to IR drainage.  - Afebrile. No tachycardia or hypotension. WBC wnl. Symptoms resolved and he is completely NT. No current indication for emergency surgery. Will advance diet to soft. If he tolerates, can be d/c'd from our standpoint on total of 10-14d of abx (such as Augmentin). Will discuss with MD about f/u. He reports he follows with Dr. Collene Mares of GI and usually gets colonoscopy's every 5 years. Last was in 2020 per his report. With this being first episode of diverticulitis, would have him touch base with GI to see if they would like to do colonoscopy earlier. Discussed discharge and return  precautions with him. Will message [facility] and let them know.   FEN - Soft diet. IVF per [facility] VTE - SCD's, Heparin gtt ID - Rocepin/Flagyl 11/27 - 11/28. Zosyn 11/28 >>    Dysuria - UA CAD HTN HLD PAF on xarelto - last dose 11/27, okay to resume at d/c.  Daily EtOH use- consider CIWA  Chronic pain - oxy 5 mg at home SLE - followed by rheumatology, on plaquenil, methotrexate, prednisone   LOS: 4 days    Jillyn Ledger , [facility] 10/31/2022, 8:50 AM Please see Amion for pager number during day hours 7:00am-4:30pm

## 2022-10-31 NOTE — Plan of Care (Signed)

## 2022-10-31 NOTE — Progress Notes (Signed)
ANTICOAGULATION CONSULT NOTE - Follow Up Consult  Pharmacy Consult for Heparin Indication: atrial fibrillation  No Known Allergies  Patient Measurements: Height: '5\' 8"'$  (172.7 cm) Weight: 77.4 kg (170 lb 10.2 oz) IBW/kg (Calculated) : 68.4 Heparin Dosing Weight: 77.4 kg  Vital Signs: Temp: 98.5 F (36.9 C) (12/01 0425) Temp Source: Oral (12/01 0425) BP: 143/82 (12/01 0425) Pulse Rate: 65 (12/01 0425)  Labs: Recent Labs    10/29/22 0556 10/29/22 1444 10/29/22 1444 10/30/22 0037 10/30/22 1031 10/30/22 2104 10/31/22 0500  HGB 12.1*  --   --  12.8*  --   --  12.5*  HCT 38.0*  --   --  40.0  --   --  38.8*  PLT 332  --   --  361  --   --  366  APTT  --  30  --  40*  --   --   --   LABPROT  --  15.3*  --   --   --   --   --   INR  --  1.2  --   --   --   --   --   HEPARINUNFRC  --  <0.10*   < > 0.15* 0.26* 0.19* 0.41  CREATININE 0.54*  --   --  0.62  --   --   --    < > = values in this interval not displayed.     Estimated Creatinine Clearance: 79.6 mL/min (by C-G formula based on SCr of 0.62 mg/dL).   Assessment:  AC/Heme: PTA Xarelto for hx Afib on hold - Hep level 0.26>>0.19 even lower  10/31/2022 HL 0.41 therapeutic on 1600 units/hr CBC stable No bleeding reported  Goal of Therapy:  Heparin level 0.3-0.7 units/ml Monitor platelets by anticoagulation protocol: Yes   Plan:  Continue heparin drip at 1600 units/hr Confirmatory level in 8 hours Daily CBC   Dolly Rias RPh 10/31/2022, 6:14 AM

## 2022-10-31 NOTE — Discharge Summary (Signed)
Physician Discharge Summary   Patient: David Weaver MRN: [id number] DOB: 1951/09/16  Admit date:     10/27/2022  Discharge date: 10/31/22  Discharge Physician: Estill Cotta, MD    PCP: Wardell Honour, MD   Recommendations at discharge:   Continue Augmentin 1 tab p.o. twice daily for 14 days Discussed with patient and wife, needs follow-up appointment with his gastroenterologist, Dr. Juanita Craver, will likely need a repeat CT and outpatient colonoscopy in next 4 to 6 weeks  Discharge Diagnoses:    Acute diverticulitis  Perforation of sigmoid colon due to diverticulitis   Abscess of sigmoid colon due to diverticulitis   Essential hypertension, benign   Paroxysmal atrial fibrillation ([name])   S/P coronary artery stent placement   Systemic lupus erythematosus ([name])   Type 2 diabetes mellitus without complication, without long-term current use of insulin ([name])   Hypokalemia     Alcohol use disorder   Hospital Course:  71 year old M with PMH of CAD s/p PCI to LAD, paroxysmal A-fib on Xarelto, diastolic CHF, DM-2, HTN, SLE and alcohol use disorder presenting with LLQ abdominal pain and dysuria and admitted for acute sigmoid diverticulitis with abscess and contained perforation.  Started on IV antibiotics.  General surgery consulted and did not feel abscess is unable to drainage after discussion with IR.  Patient was admitted for further work-up  Assessment and Plan:   Acute sigmoid diverticulitis with abscess and contained perforation, first episode -CT showed advanced to sigmoid diverticulitis with adjacent 8x 2.5x 2.5 cm abscess and free intraperitoneal air -General surgery discussed with IR and not amenable to IR drainage -Patient was placed on IV Zosyn, general surgery recommended conservative management -Tolerating solid diet, ambulating in the hallway without any difficulty, no nausea vomiting or abdominal pain. -Surgery recommended Augmentin outpatient for 14  days. -Outpatient colonoscopy in 4 to 6 weeks once acute diverticulitis is resolved, discussed in detail with patient and his wife at the bedside.  Patient has an established gastroenterologist, Dr. Collene Mares.        Essential hypertension, benign -BP stable, continue metoprolol, Norvasc     Paroxysmal atrial fibrillation ([name]) -Rate controlled, NSR, continue metoprolol.  -Xarelto resumed prior to discharge     Systemic lupus erythematosus ([name]) - methotrexate, Plaquenil and prednisone held inpatient     Type 2 diabetes mellitus without complication, without long-term current use of insulin ([name]) --Hemoglobin A1c 6.4, continue outpatient regimen     Hypokalemia -Resolved     Alcohol use disorder -Currently stable, no acute withdrawal symptoms      Pain control - [place] Controlled Substance Reporting System database was reviewed. and patient was instructed, not to drive, operate heavy machinery, perform activities at heights, swimming or participation in water activities or provide baby-sitting services while on Pain, Sleep and Anxiety Medications; until their outpatient Physician has advised to do so again. Also recommended to not to take more than prescribed Pain, Sleep and Anxiety Medications.  Consultants: General surgery Procedures performed: None Disposition: Home Diet recommendation:  Discharge Diet Orders (From admission, onward)     Start     Ordered   10/31/22 0000  Diet Carb Modified        10/31/22 1028            DISCHARGE MEDICATION: Allergies as of 10/31/2022   No Known Allergies      Medication List     TAKE these medications    albuterol 108 (90 Base) MCG/ACT inhaler Commonly known as:  VENTOLIN HFA Inhale into the lungs.   amLODipine 5 MG tablet Commonly known as: NORVASC Take 5 mg by mouth daily.   amoxicillin-clavulanate 875-125 MG tablet Commonly known as: AUGMENTIN Take 1 tablet by mouth 2 (two) times daily for 14 days.    atorvastatin 40 MG tablet Commonly known as: LIPITOR Take 1 tablet (40 mg total) by mouth daily.   carboxymethylcellulose 0.5 % Soln Commonly known as: REFRESH PLUS Place 1 drop into both eyes 3 (three) times daily as needed (dry eyes).   chlorpheniramine 4 MG tablet Commonly known as: CHLOR-TRIMETON Take 4 mg by mouth 2 (two) times daily as needed for allergies.   colestipol 1 g tablet Commonly known as: COLESTID Take 1 g by mouth 2 (two) times daily.   Dialyvite Vitamin D 5000 125 MCG (5000 UT) capsule Generic drug: Cholecalciferol Take 5,000 Units by mouth daily.   DULoxetine 60 MG capsule Commonly known as: CYMBALTA Take 1 capsule (60 mg total) 2 (two) times daily by mouth.   folic acid 1 MG tablet Commonly known as: FOLVITE Take 1 mg by mouth daily.   hydroxychloroquine 200 MG tablet Commonly known as: PLAQUENIL TAKE 1 TABLET BY MOUTH TWICE A DAY   losartan 100 MG tablet Commonly known as: COZAAR Take 1 tablet (100 mg total) by mouth daily.   metFORMIN 500 MG tablet Commonly known as: GLUCOPHAGE Take 500 mg by mouth daily.   methocarbamol 500 MG tablet Commonly known as: ROBAXIN Take 1 tablet (500 mg total) by mouth every 8 (eight) hours as needed for muscle spasms.   methotrexate 2.5 MG tablet Commonly known as: RHEUMATREX Take 10 mg by mouth once a week. Caution:Chemotherapy. Protect from light.   metoprolol tartrate 50 MG tablet Commonly known as: LOPRESSOR Take 50 mg by mouth 2 (two) times daily.   mirtazapine 15 MG tablet Commonly known as: REMERON Take 15 mg by mouth at bedtime.   ondansetron 4 MG tablet Commonly known as: Zofran Take 1 tablet (4 mg total) by mouth every 8 (eight) hours as needed for nausea or vomiting.   OVER THE COUNTER MEDICATION Apply 1 Application topically daily as needed (leg pain). CBD Cream   oxyCODONE 5 MG immediate release tablet Commonly known as: Oxy IR/ROXICODONE Take 5 mg by mouth every 6 (six) hours as  needed for moderate pain or severe pain.   pantoprazole 40 MG tablet Commonly known as: PROTONIX TAKE 1 TABLET BY MOUTH DAILY   predniSONE 1 MG tablet Commonly known as: DELTASONE Take 3 mg by mouth daily.   rivaroxaban 20 MG Tabs tablet Commonly known as: Xarelto Take 1 tablet (20 mg total) by mouth daily with supper.   Salonpas 3.12-06-08 % Ptch Generic drug: Camphor-Menthol-Methyl Sal Place 1 patch onto the skin daily as needed (pain).        Follow-up Information     Wardell Honour, MD. Schedule an appointment as soon as possible for a visit in 2 week(s).   Specialty: Family Medicine Why: for hospital follow-up Contact information: [place] [address] [phone]         Juanita Craver, MD. Schedule an appointment as soon as possible for a visit in 3 week(s).   Specialty: Gastroenterology Why: for hospital follow-up.  Will need outpatient colonoscopy in 4 to 6 weeks, once acute diverticulitis has fully resolved. Contact information: [address] [place] [address] [phone]  Discharge Exam: Filed Weights   10/27/22 2350  Weight: 77.4 kg   S: No acute complaints, ambulating in the hallway, tolerating diet, wife at bedside.  Looking forward to going home today.  No fevers, no abdominal pain.  Vitals:   10/30/22 1024 10/30/22 1601 10/30/22 2055 10/31/22 0425  BP: (!) 143/73 121/76 125/78 (!) 143/82  Pulse: 79 64 68 65  Resp:  '18 18 16  '$ Temp:   (!) 97.5 F (36.4 C) 98.5 F (36.9 C)  TempSrc:   Oral Oral  SpO2:  94% 95% 94%  Weight:      Height:        Physical Exam General: Alert and oriented x 3, NAD Cardiovascular: S1 S2 clear, RRR.  Respiratory: CTAB, no wheezing, rales or rhonchi Gastrointestinal: Soft, nontender, nondistended, NBS Ext: no pedal edema bilaterally Neuro: no new deficits Skin: No rashes Psych: Normal affect and demeanor, alert and oriented x3    Condition at  discharge: fair  The results of significant diagnostics from this hospitalization (including imaging, microbiology, ancillary and laboratory) are listed below for reference.   Imaging Studies: DG CHEST PORT 1 VIEW  Result Date: 10/27/2022 CLINICAL DATA:  Preoperative cardiovascular exam. EXAM: PORTABLE CHEST 1 VIEW COMPARISON:  May 05, 2018 FINDINGS: The heart size and mediastinal contours are within normal limits. A coronary artery stent is in place. Low lung volumes are noted. Mild atelectasis is seen within the left lung base. There is no evidence of a pleural effusion or pneumothorax. A left shoulder replacement is noted. The visualized skeletal structures are otherwise unremarkable. IMPRESSION: Low lung volumes with mild left basilar atelectasis. Electronically Signed   By: Virgina Norfolk M.D.   On: 10/27/2022 23:06   CT ABDOMEN PELVIS W CONTRAST  Result Date: 10/27/2022 CLINICAL DATA:  Abdominal pain, acute, nonlocalized.  Dysuria. EXAM: CT ABDOMEN AND PELVIS WITH CONTRAST TECHNIQUE: Multidetector CT imaging of the abdomen and pelvis was performed using the standard protocol following bolus administration of intravenous contrast. RADIATION DOSE REDUCTION: This exam was performed according to the departmental dose-optimization program which includes automated exposure control, adjustment of the mA and/or kV according to patient size and/or use of iterative reconstruction technique. CONTRAST:  68m OMNIPAQUE IOHEXOL 300 MG/ML  SOLN COMPARISON:  02/15/2018 FINDINGS: Lower chest: Chronic scarring at the medial lung bases. Hepatobiliary: Previous cholecystectomy. Liver parenchyma is normal. Pancreas: Normal Spleen: Normal Adrenals/Urinary Tract: Adrenal glands are normal. Kidneys are normal. No cyst, mass, stone or hydronephrosis. There is probably reactive thickening of the left side of the bladder due to adjacent diverticulitis. Stomach/Bowel: Stomach and small intestine are normal. Right colon is  normal. There is advanced diverticulitis affecting the sigmoid colon with regional inflammatory edema throughout the fat, an adjacent abscess measuring 8 x 2.5 x 2.5 cm, and free intraperitoneal air. Left-sided bladder diverticulum also present in that region as seen previously. Vascular/Lymphatic: Aortic atherosclerosis. No aneurysm. IVC is normal. No adenopathy. Reproductive: Normal Other: None Musculoskeletal: Scoliosis and degenerative change of the spine. IMPRESSION: 1. Advanced diverticulitis affecting the sigmoid colon with adjacent 8 x 2.5 x 2.5 cm abscess and free intraperitoneal air. 2. Left-sided bladder diverticulum. 3. Aortic atherosclerosis. 4. Scoliosis and degenerative change of the spine. Aortic Atherosclerosis (ICD10-I70.0). Electronically Signed   By: MNelson ChimesM.D.   On: 10/27/2022 12:43    Microbiology: Results for orders placed or performed during the hospital encounter of 06/19/22  Surgical pcr screen     Status: None   Collection Time: 06/19/22  1:27 PM   Specimen: Nasal Mucosa; Nasal Swab  Result Value Ref Range Status   MRSA, PCR NEGATIVE NEGATIVE Final   Staphylococcus aureus NEGATIVE NEGATIVE Final    Comment: (NOTE) The Xpert SA Assay (FDA approved for NASAL specimens in patients 87 years of age and older), is one component of a comprehensive surveillance program. It is not intended to diagnose infection nor to guide or monitor treatment. Performed at [facility], [place] [address], [place], [place] [address]     Labs: CBC: Recent Labs  Lab 10/27/22 1033 10/28/22 0008 10/29/22 0556 10/30/22 0037 10/31/22 0500  WBC 10.8* 8.5 8.2 9.0 7.9  NEUTROABS 9.1*  --   --   --   --   HGB 13.4 12.5* 12.1* 12.8* 12.5*  HCT 40.0 38.6* 38.0* 40.0 38.8*  MCV 82.3 83.9 85.2 84.2 84.3  PLT 293 301 332 361 545   Basic Metabolic Panel: Recent Labs  Lab 10/27/22 1033 10/28/22 0008 10/29/22 0556 10/30/22 0037 10/31/22 0500  NA 133* 138 140 138 141   K 3.4* 3.0* 3.3* 4.0 3.9  CL 97* 106 108 107 109  CO2 23 20* 21* 23 24  GLUCOSE 116* 87 75 109* 120*  BUN '22 18 8 '$ <5* 7*  CREATININE 0.80 0.70 0.54* 0.62 0.67  CALCIUM 8.9 8.4* 8.4* 8.6* 8.9  MG  --  2.3 1.9  --   --   PHOS  --  3.5 2.7  --   --    Liver Function Tests: Recent Labs  Lab 10/27/22 1033 10/28/22 0008 10/29/22 0556  AST 20 23  --   ALT 26 29  --   ALKPHOS 88 76  --   BILITOT 0.9 0.8  --   PROT 6.6 6.4*  --   ALBUMIN 3.8 3.2* 3.1*   CBG: Recent Labs  Lab 10/30/22 2058 10/31/22 0003 10/31/22 0427 10/31/22 0753 10/31/22 1156  GLUCAP 140* 120* 122* 104* 120*    Discharge time spent: greater than 30 minutes.  Signed: Estill Cotta, MD [facility] 10/31/2022

## 2022-10-31 NOTE — Progress Notes (Signed)
Mobility Specialist - Progress Note   10/31/22 0930  Mobility  Activity Ambulated with assistance in hallway  Level of Assistance Independent after set-up  Assistive Device  (IV Pole)  Distance Ambulated (ft) 500 ft  Activity Response Tolerated well  Mobility Referral Yes  $Mobility charge 1 Mobility   Pt received EOB and agreeable to mobility. No complaints during mobility. Pt to room standing after session with all needs met.      [facility]

## 2022-11-12 ENCOUNTER — Other Ambulatory Visit (HOSPITAL_COMMUNITY): Payer: Self-pay | Admitting: Gastroenterology

## 2022-11-12 DIAGNOSIS — R935 Abnormal findings on diagnostic imaging of other abdominal regions, including retroperitoneum: Secondary | ICD-10-CM

## 2022-11-13 ENCOUNTER — Telehealth (HOSPITAL_BASED_OUTPATIENT_CLINIC_OR_DEPARTMENT_OTHER): Payer: Self-pay

## 2022-11-13 NOTE — Telephone Encounter (Signed)
Primary [place], MD  Chart reviewed as part of pre-operative protocol coverage. Because of Hiep Ollis Stinson's past medical history and time since last visit, he/she will require a follow-up visit in order to better assess preoperative cardiovascular risk.  Pre-op covering staff: - Patient has an upcoming appointment on 11/18/2022 with Dr. Oval Linsey at which time clearance can be addressed.  Appointment notes have been updated. - Please contact requesting surgeon's office via preferred method (i.e, phone, fax) to inform them of need for appointment prior to surgery.  If applicable, this message will also be routed to pharmacy pool and/or primary cardiologist for input on holding anticoagulant/antiplatelet agent as requested below so that this information is available at time of patient's appointment.   Emmaline Life, NP-C 11/13/2022, 4:22 PM [address] Office [phone] Fax [phone]

## 2022-11-13 NOTE — Telephone Encounter (Signed)
   Pre-operative Risk Assessment    Patient Name: David Weaver  DOB: 03-27-1951 MRN: [id number]      Request for Surgical Clearance    Procedure:   Colonoscopy  Date of Surgery:  Clearance 12/30/22                                 Surgeon:  Carol Ada, MD Surgeon's Group or Practice Name:  [facility] Phone number:  [phone] Fax number:  [phone]   Type of Clearance Requested:   - Pharmacy:  Hold Rivaroxaban (Xarelto) how long?   Type of Anesthesia:   Propofol   Additional requests/questions:   Please evaluate the patient's history and advise [place] of any special consideration that should be made.  Barbaraann Faster   11/13/2022, 12:07 PM

## 2022-11-14 NOTE — Telephone Encounter (Signed)
Patient with diagnosis of afib on Xarelto for anticoagulation.    Procedure: colonoscopy Date of procedure: 12/30/22  CHA2DS2-VASc Score = 4  This indicates a 4.8% annual risk of stroke. The patient's score is based upon: CHF History: 0 HTN History: 1 Diabetes History: 1 Stroke History: 0 Vascular Disease History: 1 Age Score: 1 Gender Score: 0   CrCl 69m/min using adjusted body weight Platelet count 412K  Per office protocol, patient can hold Xarelto for 1-2 days prior to procedure.    **This guidance is not considered finalized until pre-operative APP has relayed final recommendations.**

## 2022-11-18 ENCOUNTER — Ambulatory Visit (INDEPENDENT_AMBULATORY_CARE_PROVIDER_SITE_OTHER): Payer: Medicare Other | Admitting: Cardiovascular Disease

## 2022-11-18 ENCOUNTER — Encounter (HOSPITAL_BASED_OUTPATIENT_CLINIC_OR_DEPARTMENT_OTHER): Payer: Self-pay | Admitting: Cardiovascular Disease

## 2022-11-18 VITALS — BP 130/72 | HR 74 | Ht 68.0 in | Wt 177.0 lb

## 2022-11-18 DIAGNOSIS — I480 Paroxysmal atrial fibrillation: Secondary | ICD-10-CM

## 2022-11-18 DIAGNOSIS — I739 Peripheral vascular disease, unspecified: Secondary | ICD-10-CM

## 2022-11-18 DIAGNOSIS — I119 Hypertensive heart disease without heart failure: Secondary | ICD-10-CM

## 2022-11-18 DIAGNOSIS — Z955 Presence of coronary angioplasty implant and graft: Secondary | ICD-10-CM

## 2022-11-18 DIAGNOSIS — I10 Essential (primary) hypertension: Secondary | ICD-10-CM

## 2022-11-18 DIAGNOSIS — E785 Hyperlipidemia, unspecified: Secondary | ICD-10-CM | POA: Diagnosis not present

## 2022-11-18 NOTE — Assessment & Plan Note (Signed)
Prior LAD PCI.  He is doing well and has no angina.  He walks almost daily without symptoms.  He is due for checking fasting lipids and CMP.  He will get that done today.  Continue atorvastatin and metoprolol.

## 2022-11-18 NOTE — Assessment & Plan Note (Addendum)
Blood pressure is well-controlled.  Continue losartan, amlodipine, and metoprolol.

## 2022-11-18 NOTE — Progress Notes (Signed)
Cardiology Office Note  Date:  11/18/2022   ID:  David, Weaver 1951/08/22, MRN [id number]  PCP:  Wardell Honour, MD  Cardiologist:   Skeet Latch, MD   No chief complaint on file.    History of Present Illness: David Weaver is a 71 y.o. male with CAD s/p LAD PCI, paroxysmal atrial fibrillation, hypertension, mild carotid stenosis, and lupus who presents for follow up.  [name] was seen in clinic on 08/07/15 for a presurgical assessment.  At that time he was cleared for shoulder surgery.  During surgery Mr. Klippel went into atrial fibrillation with RVR.  He reportedly went back into sinus rhythm without intervention. He was started on Eliquis '5mg'$  daily and referred to follow up with cardiology.  He subsequently had a 30 day event monitor to determine whether he needed to continue on anticoagulation. The monitor did reveal recurrent atrial fibrillation so anticoagulation was continued.  [name] reported exertional chest pain.  He was referred for an exercise Myoview 01/2018 that revealed LVEF 46% with global hypokinesis.  There was no ischemia.   He achieved 10.5 METS on a Bruce protocol.  He was then referred for an echo that revealed LVEF 55 to 60% with grade 1 diastolic dysfunction.  At that appointment he also reported dizziness and had carotid Dopplers that revealed 1 to 39% ICA stenosis bilaterally.  [name] reported exertional chest pain.  He was referred for a coronary CT-a that revealed significant obstruction in the mid LAD.  He had a left heart cath that revealed 30% ostial LAD and 80% mid LAD that was successfully stented.    At his last appointment he reported shortness of breath with walking uphill, ongoing for one month. He denied exertional chest pain. Echo 08/2021 revealed LVEF 55% and grade 1 diastolic dysfunction. There was mild aortic valve sclerosis but no stenosis. At home blood pressures averaged 150s/80s. Losartan was increased to 100 mg. He had a mechanical fall two  weeks prior without preceding symptoms. His last Afib episode was 6 months prior, lasting for a few minutes. He followed up with Laurann Montana, NP 10/2021 and his blood pressure was averaging in the 140s but ranged in the 110s-170s. He continued to complain of shortness of breath and it was thought to be due to deconditioning so he was encouraged to exercise. He had left shoulder arthroplasty 05/2022. He was admitted 10/2022 with perforated diverticulitis. He was treated conservatively with IV antibiotics, with plans for outpatient colonoscopy in 4-6 weeks.  Today, he states he is feeling better overall since his recent hospitalization. After that time, he had an episode of chest pain that he describes as a "different feeling than usual." He was walking out of a store at the time, and the episode was brief. Sometimes he has blurred vision, and may need to put his hand on the wall due to dizziness. These symptoms usually occur too quickly for him to check his blood pressure. He feels alright when sitting to standing; the dizziness would occur after walking for a time. He denies any falls. For exercise he is walking every day weather permitting. Occasionally he experiences pain in his bilateral shins while walking. Sometimes the pain is localized in his frontal thighs especially while going uphill. He denies exertional chest pain and confirms that he would be able to walk a distance of 4 blocks. The only LE edema he has noticed is mild and in his feet. He denies any palpitations,  shortness of breath, headaches, syncope, orthopnea, or PND.   Past Medical History:  Diagnosis Date   Alcohol abuse, daily use    Allergic rhinitis, cause unspecified    Angina pectoris ([place]) 03/26/2020   Atypical mole 10/19/2019   Right Cheek Mindi Slicker)   Dysthymic disorder    Esophageal reflux    Essential hypertension, benign    Fibromyalgia    followed by [name] every six months.   Fibromyositis     Hypertriglyceridemia 07/20/2020   Impotence of organic origin    Insomnia, unspecified    Left knee DJD    Lupus erythematosus    Osteoarthrosis, unspecified whether generalized or localized, lower leg    Other abnormal glucose    Other specified disorder of male genital organs(608.89)    Pure hypercholesterolemia 07/20/2020   S/P coronary artery stent placement 07/20/2020   LAD PCI 03/2020   Systemic lupus erythematosus ([place])    Unspecified hearing loss    Unspecified vitamin D deficiency     Past Surgical History:  Procedure Laterality Date   CARPAL TUNNEL RELEASE     bilateral   CHOLECYSTECTOMY     COLONOSCOPY  03/01/2014   two polyps; Dr. Benson Norway.  Repeat 5 years.   CORONARY STENT INTERVENTION N/A 04/06/2020   Procedure: CORONARY STENT INTERVENTION;  Surgeon: Wellington Hampshire, MD;  Location: [facility];  Service: Cardiovascular;  Laterality: N/A;   GANGLION CYST EXCISION  1980   Left wrist   JOINT REPLACEMENT  01/2013   right knee, left knee. [place].   KNEE ARTHROPLASTY     KNEE ARTHROSCOPY  2008   Right   LEFT HEART CATH AND CORONARY ANGIOGRAPHY N/A 04/06/2020   Procedure: LEFT HEART CATH AND CORONARY ANGIOGRAPHY;  Surgeon: Wellington Hampshire, MD;  Location: [facility];  Service: Cardiovascular;  Laterality: N/A;   lumbar spine epidural  2009   [facility]   REVERSE SHOULDER ARTHROPLASTY Left 06/25/2022   Procedure: REVERSE SHOULDER ARTHROPLASTY;  Surgeon: Hiram Gash, MD;  Location: [facility];  Service: Orthopedics;  Laterality: Left;   ROTATOR CUFF REPAIR  10/2008   right   TOTAL KNEE ARTHROPLASTY  12/27/2012   Procedure: TOTAL KNEE ARTHROPLASTY;  Surgeon: [name], MD;  Location: [facility];  Service: Orthopedics;  Laterality: Right;  RIGHT ARTHROPLASTY KNEE MEDIAL AND LATERAL COMPARTMENTS WITH PATELLA RESURFACING, RIGHT TOTAL KNEE REPLACEMENT   TOTAL KNEE ARTHROPLASTY Left 01/31/2013   Procedure: TOTAL KNEE ARTHROPLASTY;  Surgeon: [name], MD;   Location: [facility];  Service: Orthopedics;  Laterality: Left;   TOTAL SHOULDER ARTHROPLASTY       Current Outpatient Medications  Medication Sig Dispense Refill   amLODipine (NORVASC) 5 MG tablet Take 5 mg by mouth daily.     atorvastatin (LIPITOR) 40 MG tablet Take 1 tablet (40 mg total) by mouth daily. 90 tablet 3   Camphor-Menthol-Methyl Sal (SALONPAS) 3.12-06-08 % PTCH Place 1 patch onto the skin daily as needed (pain).     carboxymethylcellulose (REFRESH PLUS) 0.5 % SOLN Place 1 drop into both eyes 3 (three) times daily as needed (dry eyes).     chlorpheniramine (CHLOR-TRIMETON) 4 MG tablet Take 4 mg by mouth 2 (two) times daily as needed for allergies.     Cholecalciferol (DIALYVITE VITAMIN D 5000) 125 MCG (5000 UT) capsule Take 5,000 Units by mouth daily.     colestipol (COLESTID) 1 g tablet Take 1 g by mouth 2 (two) times daily.  5  DULoxetine (CYMBALTA) 60 MG capsule Take 1 capsule (60 mg total) 2 (two) times daily by mouth. 478 capsule 3   folic acid (FOLVITE) 1 MG tablet Take 1 mg by mouth daily.     hydroxychloroquine (PLAQUENIL) 200 MG tablet TAKE 1 TABLET BY MOUTH TWICE A DAY 180 tablet 0   losartan (COZAAR) 100 MG tablet Take 1 tablet (100 mg total) by mouth daily. 90 tablet 3   metFORMIN (GLUCOPHAGE) 500 MG tablet Take 500 mg by mouth daily.     methocarbamol (ROBAXIN) 500 MG tablet Take 1 tablet (500 mg total) by mouth every 8 (eight) hours as needed for muscle spasms. 30 tablet 0   methotrexate (RHEUMATREX) 2.5 MG tablet Take 10 mg by mouth once a week. Caution:Chemotherapy. Protect from light.     metoprolol tartrate (LOPRESSOR) 50 MG tablet Take 50 mg by mouth 2 (two) times daily.     mirtazapine (REMERON) 15 MG tablet Take 15 mg by mouth at bedtime.      ondansetron (ZOFRAN) 4 MG tablet Take 1 tablet (4 mg total) by mouth every 8 (eight) hours as needed for nausea or vomiting. 30 tablet 0   OVER THE COUNTER MEDICATION Apply 1 Application topically daily as needed (leg pain).  CBD Cream     oxyCODONE (OXY IR/ROXICODONE) 5 MG immediate release tablet Take 5 mg by mouth every 6 (six) hours as needed for moderate pain or severe pain.     pantoprazole (PROTONIX) 40 MG tablet TAKE 1 TABLET BY MOUTH DAILY 90 tablet 1   predniSONE (DELTASONE) 1 MG tablet Take 3 mg by mouth daily.     rivaroxaban (XARELTO) 20 MG TABS tablet Take 1 tablet (20 mg total) by mouth daily with supper. 90 tablet 1   No current facility-administered medications for this visit.    Allergies:   Patient has no known allergies.    Social History:  The patient  reports that he quit smoking about 7 years ago. His smoking use included cigarettes. He has a 75.00 pack-year smoking history. He has never used smokeless tobacco. He reports current alcohol use of about 14.0 - 21.0 standard drinks of alcohol per week. He reports that he does not currently use drugs.   Family History:  The patient's family history includes Alcohol abuse in his father; Alzheimer's disease in his mother; Cancer in his brother; Diabetes in his brother; Heart disease (age of onset: 47) in his father; Kidney failure in his brother; Osteoporosis in his mother.    ROS:   Please see the history of present illness.  (+) Chest pain (+) Blurry vision (+) Dizziness (+) LE edema of the feet All other systems are reviewed and negative.    PHYSICAL EXAM: VS:  BP 130/72   Pulse 74   Ht '5\' 8"'$  (1.727 m)   Wt 177 lb (80.3 kg)   SpO2 95%   BMI 26.91 kg/m  , BMI Body mass index is 26.91 kg/m. GENERAL:  Well appearing HEENT: Pupils equal round and reactive, fundi not visualized, oral mucosa unremarkable NECK:  No jugular venous distention, waveform within normal limits, carotid upstroke brisk and symmetric, no bruits, no thyromegaly LUNGS:  Clear to auscultation bilaterally HEART:  RRR.  PMI not displaced or sustained,S1 and S2 within normal limits, no S3, no S4, no clicks, no rubs, no murmurs ABD:  Flat, positive bowel sounds normal  in frequency in pitch, no bruits, no rebound, no guarding, no midline pulsatile mass, no hepatomegaly, no splenomegaly  EXT:  2 plus pulses throughout, no edema, no cyanosis no clubbing SKIN:  No rashes no nodules NEURO:  Cranial nerves II through XII grossly intact, motor grossly intact throughout PSYCH:  Cognitively intact, oriented to person place and time    EKG:  EKG is personally reviewed. 11/18/2022:  Sinus rhythm. Rate 74 bpm. 09/12/2021: Sinus rhythm.  Rate 63 bpm. 03/09/20: Sinus rhythm.  Rate 73 bpm. 01/28/18: Sinus rhythm.  PACs. 04/22/16: Sinus rhythm rate 64 bpm. Nonspecific T wave changes. 07/16/15: Sinus rhythm 93 bpm.  Non-specific t wave changes. 04/05/14: Ectopic atrial rhythm. Non-specific inferior T wave changes.  CT Chest  06/10/2022: IMPRESSION: 1. Lung-RADS 2, benign appearance or behavior. Continue annual screening with low-dose chest CT without contrast in 12 months. 2. Interval T5 mild superior endplate compression deformity. 3. Aortic Atherosclerosis (ICD10-I70.0) and Emphysema (ICD10-J43.9). Coronary artery atherosclerosis.  Echo  09/19/2021: Sonographer Comments: Suboptimal apical window and patient is morbidly  obese. Image acquisition challenging due to respiratory motion and Image  acquisition challenging due to patient body habitus.   IMPRESSIONS   1. Left ventricular ejection fraction, by estimation, is 55%. Left  ventricular ejection fraction by 2D MOD biplane is 59.4 %. The left  ventricle has normal function. The left ventricle has no regional wall  motion abnormalities. Left ventricular  diastolic parameters are consistent with Grade I diastolic dysfunction  (impaired relaxation).   2. Right ventricular systolic function is normal. The right ventricular  size is normal.   3. Left atrial size was moderately dilated.   4. The mitral valve is grossly normal. No evidence of mitral valve  regurgitation. No evidence of mitral stenosis. Moderate mitral  annular  calcification.   5. The aortic valve is tricuspid. Aortic valve regurgitation is trivial.  Mild aortic valve sclerosis is present, with no evidence of aortic valve  stenosis.   Comparison(s): A prior study was performed on 02/23/2018. EF 55%.   Bilateral Carotid Doppler  04/01/2021: Summary:  Right Carotid: Velocities in the right ICA are consistent with a 1-39%  stenosis.   Left Carotid: Velocities in the left ICA are consistent with a 1-39%  stenosis.   Vertebrals: Right vertebral artery demonstrates antegrade flow. Left  vertebral              artery demonstrates high resistant flow.  Subclavians: Normal flow hemodynamics were seen in bilateral subclavian               arteries.    LHC 04/06/20: Diagnostic Dominance: Right  Intervention      FFR CT 03/30/20: IMPRESSION: 1. CT FFR analysis showed significant stenosis in the mid LAD at the level of D3. 2.  No other significant stenoses were identified.  Coronary CT-A 03/2020: IMPRESSION: 1. Coronary calcium score of 522. This was 72nd percentile for age and sex matched control.   2. Normal coronary origin with right dominance.   3. There is moderate (CAD RADS3) plaque in the mid LAD and distal RCA.   4. There is concern that the mid LAD lesion could be obstructive. Will send study for FFRct.  Echo 02/23/18: Study Conclusions   - Left ventricle: The cavity size was normal. Systolic function was   normal. The estimated ejection fraction was in the range of 55%   to 60%. Wall motion was normal; there were no regional wall   motion abnormalities. Doppler parameters are consistent with   abnormal left ventricular relaxation (grade 1 diastolic   dysfunction). - Left atrium:  The atrium was mildly dilated.  Lexiscan Myoview 02/10/18: The left ventricular ejection fraction is mildly decreased (45-54%). Nuclear stress EF: 46%. Mild generalized hypokinesis There was no ST segment deviation noted during  stress. The study is normal. This is a low risk study. No ischemia. Prior ECHO 2016 - normal EF.  Carotid Doppler 02/10/18: 1-39% ICA stenosis bilaterally  Recent Labs: 10/28/2022: ALT 29 10/29/2022: Magnesium 1.9 10/31/2022: BUN 7; Creatinine, Ser 0.67; Hemoglobin 12.5; Platelets 366; Potassium 3.9; Sodium 141    Lipid Panel    Component Value Date/Time   CHOL 161 07/30/2020 0824   TRIG 335 (H) 07/30/2020 0824   HDL 57 07/30/2020 0824   CHOLHDL 2.8 07/30/2020 0824   CHOLHDL 3.9 09/02/2016 1035   VLDL 43 (H) 09/02/2016 1035   LDLCALC 53 07/30/2020 0824   06/27/21:  Total cholesterol 129, triglycerides 102, HDL 48, LDL 61    Wt Readings from Last 3 Encounters:  11/18/22 177 lb (80.3 kg)  10/27/22 170 lb 10.2 oz (77.4 kg)  06/25/22 185 lb 9.6 oz (84.2 kg)     Other studies Reviewed: Additional studies/ records that were reviewed today include:. Review of the above records demonstrates:  Please see elsewhere in the note.     ASSESSMENT AND PLAN:  Hypertensive heart disease without heart failure Blood pressure is well-controlled.  Continue losartan, amlodipine, and metoprolol.  S/P coronary artery stent placement Prior LAD PCI.  He is doing well and has no angina.  He walks almost daily without symptoms.  He is due for checking fasting lipids and CMP.  He will get that done today.  Continue atorvastatin and metoprolol.  Paroxysmal atrial fibrillation ([name]) He is currently maintaining sinus rhythm.  Continue metoprolol and Xarelto.  Okay to hold Xarelto as needed prior to colonoscopy.   Current medicines are reviewed at length with the patient today.  The patient does not have concerns regarding medicines.  The following changes have been made:  no change  Labs/ tests ordered today include:  Orders Placed This Encounter  Procedures   EKG 12-Lead    Disposition:   FU with Dr. Jonelle Sidle C. Smithers in 6 months.   I,Mathew Stumpf,acting as a Education administrator for Skeet Latch, MD.,have documented all relevant documentation on the behalf of Skeet Latch, MD,as directed by  Skeet Latch, MD while in the presence of Skeet Latch, MD.  I, Nowata Oval Linsey, MD have reviewed all documentation for this visit.  The documentation of the exam, diagnosis, procedures, and orders on 11/18/2022 are all accurate and complete.   Signed, Skeet Latch, MD  11/18/2022 9:36 AM    [place]

## 2022-11-18 NOTE — Assessment & Plan Note (Signed)
He is currently maintaining sinus rhythm.  Continue metoprolol and Xarelto.  Okay to hold Xarelto as needed prior to colonoscopy.

## 2022-11-18 NOTE — Patient Instructions (Addendum)
Medication Instructions:  Your physician recommends that you continue on your current medications as directed. Please refer to the Current Medication list given to you today.   *If you need a refill on your cardiac medications before your next appointment, please call your pharmacy*  Lab Work: LP/CMET TODAY   If you have labs (blood work) drawn today and your tests are completely normal, you will receive your results only by: [place] (if you have MyChart) OR A paper copy in the mail If you have any lab test that is abnormal or we need to change your treatment, we will call you to review the results.  Testing/Procedures: Your physician has requested that you have an ankle brachial index (ABI). During this test an ultrasound and blood pressure cuff are used to evaluate the arteries that supply the arms and legs with blood. Allow thirty minutes for this exam. There are no restrictions or special instructions.  Your physician has requested that you have a lower or upper extremity arterial duplex. This test is an ultrasound of the arteries in the legs or arms. It looks at arterial blood flow in the legs and arms. Allow one hour for Lower and Upper Arterial scans. There are no restrictions or special instructions  Follow-Up: At [facility], you and your health needs are our priority.  As part of our continuing mission to provide you with exceptional heart care, we have created designated Provider Care Teams.  These Care Teams include your primary Cardiologist (physician) and Advanced Practice Providers (APPs -  Physician Assistants and Nurse Practitioners) who all work together to provide you with the care you need, when you need it.  We recommend signing up for the patient portal called "MyChart".  Sign up information is provided on this After Visit Summary.  MyChart is used to connect with patients for Virtual Visits (Telemedicine).  Patients are able to view lab/test results,  encounter notes, upcoming appointments, etc.  Non-urgent messages can be sent to your provider as well.   To learn more about what you can do with MyChart, go to [email or web address].    Your next appointment:   6 month(s) WITH CATILIN W NO   1 YEAR WITH DR [facility]    Other Instructions YOU ARE CLEARED TO HAVE YOUR COLONOSCOPY

## 2022-11-18 NOTE — Telephone Encounter (Signed)
Dr Oval Linsey saw patient and he low risk for colonoscopy   Will route phone note and office visit to Dr Benson Norway

## 2022-11-19 ENCOUNTER — Telehealth (HOSPITAL_BASED_OUTPATIENT_CLINIC_OR_DEPARTMENT_OTHER): Payer: Self-pay

## 2022-11-19 DIAGNOSIS — E785 Hyperlipidemia, unspecified: Secondary | ICD-10-CM

## 2022-11-19 LAB — LIPID PANEL
Chol/HDL Ratio: 3.1 ratio (ref 0.0–5.0)
Cholesterol, Total: 166 mg/dL (ref 100–199)
HDL: 53 mg/dL (ref >39)
LDL Chol Calc (NIH): 81 mg/dL (ref 0–99)
Triglycerides: 188 mg/dL — ABNORMAL HIGH (ref 0–149)
VLDL Cholesterol Cal: 32 mg/dL (ref 5–40)

## 2022-11-19 LAB — COMPREHENSIVE METABOLIC PANEL
ALT: 19 IU/L (ref 0–44)
AST: 15 IU/L (ref 0–40)
Albumin/Globulin Ratio: 2.2 (ref 1.2–2.2)
Albumin: 4.6 g/dL (ref 3.8–4.8)
Alkaline Phosphatase: 84 IU/L (ref 44–121)
BUN/Creatinine Ratio: 24 (ref 10–24)
BUN: 14 mg/dL (ref 8–27)
Bilirubin Total: 0.4 mg/dL (ref 0.0–1.2)
CO2: 22 mmol/L (ref 20–29)
Calcium: 9.4 mg/dL (ref 8.6–10.2)
Chloride: 103 mmol/L (ref 96–106)
Creatinine, Ser: 0.59 mg/dL — ABNORMAL LOW (ref 0.76–1.27)
Globulin, Total: 2.1 g/dL (ref 1.5–4.5)
Glucose: 110 mg/dL — ABNORMAL HIGH (ref 70–99)
Potassium: 4.2 mmol/L (ref 3.5–5.2)
Sodium: 140 mmol/L (ref 134–144)
Total Protein: 6.7 g/dL (ref 6.0–8.5)
eGFR: 102 mL/min/{1.73_m2} (ref >59)

## 2022-11-19 MED ORDER — EZETIMIBE 10 MG PO TABS: 10.0000 mg | ORAL_TABLET | Freq: Every day | ORAL | 3 refills | Status: DC

## 2022-11-19 NOTE — Telephone Encounter (Addendum)
Results called to patient who verbalizes understanding! Labs ordered and mailed, prescriptions updated and sent to pharmacy on file.   ----- Message from Loel Dubonnet, NP sent at 11/19/2022  1:13 PM EST ----- Normal kidneys, liver, electrolytes.   LDL (bad cholesterol) not at goal of <70.   Ensure taking atorvastatin '40mg'$  daily. If not taking, resume and repeat FLP/LFT in 3 months If taking regularly, add Zetia '10mg'$  daily and repeat FLP/LFT in 3 months

## 2022-11-26 MED ORDER — EZETIMIBE 10 MG PO TABS: 10.0000 mg | ORAL_TABLET | Freq: Every day | ORAL | 3 refills | Status: DC

## 2022-11-26 NOTE — Addendum Note (Signed)
Addended by: Gerald Stabs on: 11/26/2022 07:43 AM   Modules accepted: Orders

## 2022-12-08 ENCOUNTER — Ambulatory Visit (HOSPITAL_COMMUNITY)
Admission: RE | Admit: 2022-12-08 | Discharge: 2022-12-08 | Disposition: A | Discharge status: Home / Self Care | Payer: Medicare Other | Source: Ambulatory Visit | Attending: Gastroenterology | Admitting: Gastroenterology

## 2022-12-08 DIAGNOSIS — R935 Abnormal findings on diagnostic imaging of other abdominal regions, including retroperitoneum: Secondary | ICD-10-CM | POA: Diagnosis present

## 2022-12-08 MED ORDER — IOHEXOL 300 MG/ML  SOLN
100.0000 mL | Freq: Once | INTRAMUSCULAR | Status: AC | PRN
  Administered 2022-12-08: 100 mL via INTRAVENOUS

## 2022-12-08 MED ORDER — SODIUM CHLORIDE (PF) 0.9 % IJ SOLN
INTRAMUSCULAR | Status: AC
  Filled 2022-12-08: qty 50

## 2022-12-16 ENCOUNTER — Encounter (HOSPITAL_BASED_OUTPATIENT_CLINIC_OR_DEPARTMENT_OTHER): Payer: Medicare Other

## 2022-12-30 ENCOUNTER — Encounter (HOSPITAL_BASED_OUTPATIENT_CLINIC_OR_DEPARTMENT_OTHER): Payer: Medicare Other

## 2022-12-31 ENCOUNTER — Encounter (HOSPITAL_BASED_OUTPATIENT_CLINIC_OR_DEPARTMENT_OTHER): Payer: Medicare Other

## 2023-01-14 ENCOUNTER — Encounter (HOSPITAL_BASED_OUTPATIENT_CLINIC_OR_DEPARTMENT_OTHER): Payer: Medicare Other

## 2023-01-14 ENCOUNTER — Ambulatory Visit (INDEPENDENT_AMBULATORY_CARE_PROVIDER_SITE_OTHER): Payer: Medicare Other

## 2023-01-14 DIAGNOSIS — I739 Peripheral vascular disease, unspecified: Secondary | ICD-10-CM | POA: Diagnosis not present

## 2023-01-14 LAB — VAS US LOWER EXT ART SEG MULTI (SEGMENTALS & LE RAYNAUDS)
Left ABI: 1.17
Right ABI: 1.15

## 2023-02-17 LAB — HEPATIC FUNCTION PANEL
ALT: 41 IU/L (ref 0–44)
AST: 20 IU/L (ref 0–40)
Albumin: 4.8 g/dL (ref 3.8–4.8)
Alkaline Phosphatase: 91 IU/L (ref 44–121)
Bilirubin Total: 0.5 mg/dL (ref 0.0–1.2)
Bilirubin, Direct: 0.16 mg/dL (ref 0.00–0.40)
Total Protein: 6.6 g/dL (ref 6.0–8.5)

## 2023-02-17 LAB — LIPID PANEL
Chol/HDL Ratio: 2.3 ratio (ref 0.0–5.0)
Cholesterol, Total: 115 mg/dL (ref 100–199)
HDL: 50 mg/dL (ref >39)
LDL Chol Calc (NIH): 33 mg/dL (ref 0–99)
Triglycerides: 206 mg/dL — ABNORMAL HIGH (ref 0–149)
VLDL Cholesterol Cal: 32 mg/dL (ref 5–40)

## 2023-03-16 ENCOUNTER — Encounter: Payer: Self-pay | Admitting: Podiatry

## 2023-03-16 ENCOUNTER — Ambulatory Visit (INDEPENDENT_AMBULATORY_CARE_PROVIDER_SITE_OTHER): Payer: Medicare Other | Admitting: Podiatry

## 2023-03-16 DIAGNOSIS — M778 Other enthesopathies, not elsewhere classified: Secondary | ICD-10-CM | POA: Diagnosis not present

## 2023-03-16 MED ORDER — TRIAMCINOLONE ACETONIDE 10 MG/ML IJ SUSP: 10.0000 mg | Freq: Once | INTRAMUSCULAR | Status: AC

## 2023-03-16 NOTE — Progress Notes (Signed)
Patient presents stating subjective:   Patient ID: David Weaver, male   DOB: 72 y.o.   MRN: [id number]   HPI Pain is reoccurred again around the metatarsal cuneiform joint and it did do well for a while neurovascular is   ROS      Objective:  Physical Exam  Status intact with patient found to have inflammation pain first metatarsocuneiform joint left around the tendon complex     Assessment:  Chronic arthritis with tendinitis     Plan:  Sterile prep injected the extensor tendon complex 3 mg Dexasone Kenalog 5 mg Xylocaine advised on reduced activity ice reappoint as needed

## 2023-05-15 ENCOUNTER — Other Ambulatory Visit: Payer: Self-pay | Admitting: Acute Care

## 2023-05-15 DIAGNOSIS — Z122 Encounter for screening for malignant neoplasm of respiratory organs: Secondary | ICD-10-CM

## 2023-05-15 DIAGNOSIS — Z87891 Personal history of nicotine dependence: Secondary | ICD-10-CM

## 2023-06-08 ENCOUNTER — Encounter: Payer: Self-pay | Admitting: Podiatry

## 2023-06-08 ENCOUNTER — Ambulatory Visit: Payer: Medicare Other

## 2023-06-08 ENCOUNTER — Ambulatory Visit (INDEPENDENT_AMBULATORY_CARE_PROVIDER_SITE_OTHER): Payer: Medicare Other | Admitting: Podiatry

## 2023-06-08 DIAGNOSIS — M76812 Anterior tibial syndrome, left leg: Secondary | ICD-10-CM

## 2023-06-08 DIAGNOSIS — M79672 Pain in left foot: Secondary | ICD-10-CM | POA: Diagnosis not present

## 2023-06-08 MED ORDER — TRIAMCINOLONE ACETONIDE 10 MG/ML IJ SUSP
10.0000 mg | Freq: Once | INTRAMUSCULAR | Status: AC
  Administered 2023-06-08: 10 mg

## 2023-06-08 NOTE — Progress Notes (Signed)
Subjective:   Patient ID: David Weaver, male   DOB: 72 y.o.   MRN: [id number]   HPI Patient states top of his foot is hurting but it is more on the inside now the top itself seems pretty good   ROS      Objective:  Physical Exam  Ocular status intact which pain is migrated a little more medial than where it was previously with inflammation     Assessment:  Inflammation around the anterior tibial tendon insertion left with probability of arthritis of the metatarsal cuneiform joint     Plan:  H&P reviewed careful steroid injection administered after explaining risk to patient 3 mg dexamethasone Kenalog 5 mg Xylocaine and advised on reduced activity and the wearing of good support shoes

## 2023-06-16 ENCOUNTER — Ambulatory Visit
Admission: RE | Admit: 2023-06-16 | Discharge: 2023-06-16 | Disposition: A | Discharge status: Home / Self Care | Payer: Medicare Other | Source: Ambulatory Visit | Attending: Acute Care | Admitting: Acute Care

## 2023-06-16 ENCOUNTER — Encounter: Payer: Self-pay | Admitting: Chiropractic Medicine

## 2023-06-16 DIAGNOSIS — Z122 Encounter for screening for malignant neoplasm of respiratory organs: Secondary | ICD-10-CM

## 2023-06-16 DIAGNOSIS — Z87891 Personal history of nicotine dependence: Secondary | ICD-10-CM

## 2023-06-22 ENCOUNTER — Other Ambulatory Visit: Payer: Self-pay

## 2023-06-22 DIAGNOSIS — Z122 Encounter for screening for malignant neoplasm of respiratory organs: Secondary | ICD-10-CM

## 2023-06-22 DIAGNOSIS — Z87891 Personal history of nicotine dependence: Secondary | ICD-10-CM

## 2023-06-29 ENCOUNTER — Ambulatory Visit (INDEPENDENT_AMBULATORY_CARE_PROVIDER_SITE_OTHER): Payer: Medicare Other | Admitting: Family

## 2023-06-29 ENCOUNTER — Encounter (HOSPITAL_BASED_OUTPATIENT_CLINIC_OR_DEPARTMENT_OTHER): Payer: Self-pay | Admitting: Family

## 2023-06-29 VITALS — BP 116/78 | HR 63 | Ht 68.0 in | Wt 191.0 lb

## 2023-06-29 DIAGNOSIS — D6859 Other primary thrombophilia: Secondary | ICD-10-CM

## 2023-06-29 DIAGNOSIS — I25118 Atherosclerotic heart disease of native coronary artery with other forms of angina pectoris: Secondary | ICD-10-CM

## 2023-06-29 DIAGNOSIS — I6523 Occlusion and stenosis of bilateral carotid arteries: Secondary | ICD-10-CM

## 2023-06-29 DIAGNOSIS — R0609 Other forms of dyspnea: Secondary | ICD-10-CM

## 2023-06-29 DIAGNOSIS — I10 Essential (primary) hypertension: Secondary | ICD-10-CM

## 2023-06-29 DIAGNOSIS — E785 Hyperlipidemia, unspecified: Secondary | ICD-10-CM

## 2023-06-29 DIAGNOSIS — I480 Paroxysmal atrial fibrillation: Secondary | ICD-10-CM

## 2023-06-29 NOTE — Progress Notes (Signed)
Cardiology Office Note:  .   Date:  06/29/2023  ID:  David Weaver, DOB June 30, 1951, MRN [id number] PCP: Ethelda Chick, MD  [facility] Providers Cardiologist:  Chilton Si, MD    History of Present Illness: David Weaver   David Weaver is a 72 y.o. male with a hx of CAD s/p LAD PCI, PAF, HTN, mild carotid stenosis, lupus.   He was seen in clinic September 2016 for presurgical assessment and granted clearance.  During shoulder surgery he went into atrial fibrillation with RVR.  Returned to NSR without intervention.  He was started on Eliquis and referred to cardiology.  Subsequent 30-day monitor with recurrent atrial fibrillation and anticoagulation was continued.  Exercise Myoview March 2019 LVEF 46% with global hypokinesis and no ischemia.  He achieved 10.5 METS on Bruce protocol.  He was referred for echo with LVEF 55 to 60%, grade 1 diastolic dysfunction.  He had carotid Dopplers 2020 revealing 1-39% ICA stenosis bilaterally.  He had coronary CTA with significant obstruction in the mid LAD with left heart cath revealing 30% ostial LAD and 80% mid LAD stenosis successfully stented 03/2020.  Carotid duplex 04/01/2021 with bilateral 1-39% stenosis.    Echo which was performed 09/19/21 and showed LVEF 55%, grade 1 diastolic dysfunction, normal RVSF, trivial AI. His shortness of breath was attributed to deconditioning and exercise recommended.  Losartan increased to 100 mg daily for BP control.  Underwent left shoulder arthroplasty 05/2022.  Admitted to also 2022 with perforated diverticulitis treated conservatively.  Last seen 11/08/2022 by Dr. Duke Salvia.  His BP was well-controlled.  Reported no angina, maintaining sinus rhythm and given clearance to hold Xarelto prior to colonoscopy.  Zetia added as LDL not at goal of less than 70.  At PCP visit 03/2023 amlodipine held due to hypotension. It was later resumed as BP elevated.   Presents today for follow up. 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 Drinking caffeine free, diet mountain dew during the day. Notes poor energy waking feeling tired ongoing for 6 months unchanged. Wakes up at 6:30, watch news and television til lunch, then goes to his shop after lunch. No known snoring but does note feeling tired on waking and daytime somnolence.   Reports no shortness of breath at rest but does note stable dyspnea on exertion. Reports no chest pain, pressure, or tightness. No edema, orthopnea, PND. Reports no palpitations.  Notes some lightheadedness with sensation of needing to sit down. No melena nor hematuria.    ROS: Please see the history of present illness.    All other systems reviewed and are negative.   Studies Reviewed: David Weaver   EKG Interpretation Date/Time:  Monday June 29 2023 10:12:14 EDT Ventricular Rate:  63 PR Interval:  164 QRS Duration:  86 QT Interval:  410 QTC Calculation: 419 R Axis:   61  Text Interpretation: Normal sinus rhythm No acute ST/T wave changes. Confirmed by Gillian Shields ([id number]) on 06/29/2023 10:40:21 AM    Cardiac Studies & Procedures   CARDIAC CATHETERIZATION  CARDIAC CATHETERIZATION 04/06/2020  Narrative  The left ventricular systolic function is normal.  LV end diastolic pressure is normal.  The left ventricular ejection fraction is 55-65% by visual estimate.  Mid LAD lesion is 80% stenosed.  Post intervention, there is a 0% residual stenosis.  A drug-eluting stent was successfully placed using a STENT RESOLUTE ONYX 3.0X34.  Ost LAD lesion is 30% stenosed.  1.  Severe one-vessel coronary artery disease involving mid LAD with 80% moderately calcified eccentric stenosis.  No other obstructive disease. 2.  Normal LV systolic function and left ventricular end-diastolic pressure. 3.  Successful angioplasty and drug-eluting stent placement to the mid LAD.  PCI indication is class III angina on maximal medical therapy and intermediate risk noninvasive  testing.  Recommendations: Xarelto can be resumed tomorrow. Given that the patient is long-term anticoagulation with Xarelto, recommend clopidogrel monotherapy without aspirin to minimize risk of bleeding. I increased atorvastatin to 40 mg daily. The patient is a candidate for same-day discharge.  Findings Coronary Findings Diagnostic  Dominance: Right  Left Main Vessel is angiographically normal.  Left Anterior Descending Ost LAD lesion is 30% stenosed. Mid LAD lesion is 80% stenosed. The lesion is type C. The lesion is moderately calcified.  Left Circumflex Vessel is small. Vessel is angiographically normal.  First Obtuse Marginal Branch Vessel is angiographically normal.  Second Obtuse Marginal Branch Vessel is small in size. Vessel is angiographically normal.  Third Obtuse Marginal Branch Vessel is small in size. Vessel is angiographically normal.  Right Coronary Artery The vessel exhibits minimal luminal irregularities.  Right Posterior Descending Artery The vessel exhibits minimal luminal irregularities.  Right Posterior Atrioventricular Artery Vessel is angiographically normal.  Intervention  Mid LAD lesion Stent Lesion length:  30 mm. CATH VISTA GUIDE 6FR XBLAD3.5 guide catheter was inserted. Lesion crossed with guidewire using a WIRE RUNTHROUGH .[id number]. Pre-stent angioplasty was performed using a BALLOON SAPPHIRE 2.5X15. Maximum pressure:  10 atm. Inflation time:  30 sec. A drug-eluting stent was successfully placed using a STENT RESOLUTE ONYX 3.0X34. Maximum pressure: 12 atm. Inflation time: 20 sec. Post-stent angioplasty was performed using a BALLOON SAPPHIRE [place] 3.25X18. Maximum pressure:  20 atm. Inflation time:  30 sec. Noncompliant balloon was inflated to 12 atm distally and a maximum of 20 atm in the mid segment Post-Intervention Lesion Assessment The intervention was successful. Pre-interventional TIMI flow is 3. Post-intervention TIMI flow is 3. No  complications occurred at this lesion. There is a 0% residual stenosis post intervention.   STRESS TESTS  MYOCARDIAL PERFUSION IMAGING 02/10/2018  Narrative  The left ventricular ejection fraction is mildly decreased (45-54%).  Nuclear stress EF: 46%. Mild generalized hypokinesis  There was no ST segment deviation noted during stress.  The study is normal.  This is a low risk study. No ischemia. Prior ECHO 2016 - normal EF.  Donato Schultz, MD   ECHOCARDIOGRAM  ECHOCARDIOGRAM COMPLETE 09/19/2021  Narrative ECHOCARDIOGRAM REPORT    Patient Name:   SHADE MCCALIP Date of Exam: 09/19/2021 Medical Rec #:  [id number]     Height:       67.0 in Accession #:    [id number]    Weight:       192.6 lb Date of Birth:  Nov 13, 1951     BSA:          1.990 m Patient Age:    70 years      BP:           158/86 mmHg Patient Gender: M             HR:           62 bpm. Exam Location:  Outpatient  Procedure: 2D Echo, Color Doppler, Limited Color Doppler and Strain Analysis  Indications:    R06.9 DOE; R60.0 Lower extremity edema  History:        Patient has prior history of Echocardiogram examinations,  most recent 02/23/2018. CAD, LAD PCI, COPD and Emphysema seen on CT Chest/Lung, Arrythmias:Atrial Fibrillation, Signs/Symptoms:Dyspnea and Edema; Risk Factors:Hypertension, Dyslipidemia and Former Smoker. Patient denies chest pain and SOB. He does have DOE with bilateral ankle edema left > right. History of Lupus.  Sonographer:    Carlos American RVT, RDCS (AE), RDMS Referring Phys: [phone] [facility] [place]   Sonographer Comments: Suboptimal apical window and patient is morbidly obese. Image acquisition challenging due to respiratory motion and Image acquisition challenging due to patient body habitus. IMPRESSIONS   1. Left ventricular ejection fraction, by estimation, is 55%. Left ventricular ejection fraction by 2D MOD biplane is 59.4 %. The left ventricle has normal function. The left  ventricle has no regional wall motion abnormalities. Left ventricular diastolic parameters are consistent with Grade I diastolic dysfunction (impaired relaxation). 2. Right ventricular systolic function is normal. The right ventricular size is normal. 3. Left atrial size was moderately dilated. 4. The mitral valve is grossly normal. No evidence of mitral valve regurgitation. No evidence of mitral stenosis. Moderate mitral annular calcification. 5. The aortic valve is tricuspid. Aortic valve regurgitation is trivial. Mild aortic valve sclerosis is present, with no evidence of aortic valve stenosis.  Comparison(s): A prior study was performed on 02/23/2018. EF 55%.  FINDINGS Left Ventricle: Left ventricular ejection fraction, by estimation, is 55%. Left ventricular ejection fraction by 2D MOD biplane is 59.4 %. The left ventricle has normal function. The left ventricle has no regional wall motion abnormalities. Global longitudinal strain performed but not reported based on interpreter judgement due to suboptimal tracking. 3D left ventricular ejection fraction analysis performed but not reported based on interpreter judgement due to suboptimal quality. The left ventricular internal cavity size was normal in size. There is no left ventricular hypertrophy. Left ventricular diastolic parameters are consistent with Grade I diastolic dysfunction (impaired relaxation).  Right Ventricle: The right ventricular size is normal. No increase in right ventricular wall thickness. Right ventricular systolic function is normal.  Left Atrium: Left atrial size was moderately dilated.  Right Atrium: Right atrial size was normal in size.  Pericardium: There is no evidence of pericardial effusion.  Mitral Valve: The mitral valve is grossly normal. Moderate mitral annular calcification. No evidence of mitral valve regurgitation. No evidence of mitral valve stenosis.  Tricuspid Valve: The tricuspid valve is normal in  structure. Tricuspid valve regurgitation is mild . No evidence of tricuspid stenosis.  Aortic Valve: The aortic valve is tricuspid. Aortic valve regurgitation is trivial. Mild aortic valve sclerosis is present, with no evidence of aortic valve stenosis. Aortic valve mean gradient measures 4.0 mmHg. Aortic valve peak gradient measures 8.6 mmHg. Aortic valve area, by VTI measures 3.06 cm.  Pulmonic Valve: The pulmonic valve was not well visualized. Pulmonic valve regurgitation is not visualized. No evidence of pulmonic stenosis.  Aorta: The aortic root, ascending aorta and aortic arch are all structurally normal, with no evidence of dilitation or obstruction.  IAS/Shunts: The atrial septum is grossly normal.   LEFT VENTRICLE PLAX 2D                        Biplane EF (MOD) LVIDd:         4.57 cm         LV Biplane EF:   Left LVIDs:         3.07 cm  ventricular LV PW:         0.82 cm                          ejection LV IVS:        0.94 cm                          fraction by LVOT diam:     2.20 cm                          2D MOD LV SV:         87                               biplane is LV SV Index:   44                               59.4 %. LVOT Area:     3.80 cm Diastology LV e' medial:    6.64 cm/s LV Volumes (MOD)               LV E/e' medial:  13.4 LV vol d, MOD    79.9 ml       LV e' lateral:   9.25 cm/s A2C:                           LV E/e' lateral: 9.6 LV vol d, MOD    55.3 ml A4C: LV vol s, MOD    25.6 ml A2C: LV vol s, MOD    26.0 ml       3D Volume EF: A4C:                           3D EF:        51 % LV SV MOD A2C:   54.3 ml       LV EDV:       104 ml LV SV MOD A4C:   55.3 ml       LV ESV:       51 ml LV SV MOD BP:    40.8 ml       LV SV:        53 ml  RIGHT VENTRICLE RV S prime:     9.46 cm/s TAPSE (M-mode): 2.0 cm  LEFT ATRIUM              Index        RIGHT ATRIUM           Index LA diam:        3.90 cm  1.96 cm/m   RA Area:     13.50  cm LA Vol (A2C):   108.0 ml 54.27 ml/m  RA Volume:   31.50 ml  15.83 ml/m LA Vol (A4C):   74.0 ml  37.18 ml/m LA Biplane Vol: 90.8 ml  45.63 ml/m AORTIC VALVE                    PULMONIC VALVE AV Area (Vmax):    2.79 cm     PV Vmax:          0.80 m/s AV Area (  Vmean):   2.86 cm     PV Peak grad:     2.6 mmHg AV Area (VTI):     3.06 cm     PR End Diast Vel: 0.89 msec AV Vmax:           147.00 cm/s AV Vmean:          89.550 cm/s AV VTI:            0.286 m AV Peak Grad:      8.6 mmHg AV Mean Grad:      4.0 mmHg LVOT Vmax:         108.00 cm/s LVOT Vmean:        67.300 cm/s LVOT VTI:          0.230 m LVOT/AV VTI ratio: 0.80 AR Vena Contracta: 0.29 cm  AORTA Ao Root diam: 3.75 cm Ao Asc diam:  3.80 cm Ao Arch diam: 3.7 cm  MITRAL VALVE               TRICUSPID VALVE MV Area (PHT): 4.24 cm    TR Peak grad:   14.1 mmHg MV Decel Time: 179 msec    TR Vmax:        188.00 cm/s MV E velocity: 88.80 cm/s MV A velocity: 88.80 cm/s  SHUNTS MV E/A ratio:  1.00        Systemic VTI:  0.23 m Systemic Diam: 2.20 cm  Riley Lam MD Electronically signed by Riley Lam MD Signature Date/Time: 09/19/2021/2:00:26 PM    Final    MONITORS  CARDIAC EVENT MONITOR 09/04/2015  Narrative Images from the original result were not included.    30 Day Event Monitor  Quality: Fair.  Baseline artifact.  Sinus rhythm, sinus tachycardia and atrial fibrillation with rates in the 60s-70s were noted. No pauses or other arrhythmias were noted.   Tiffany C. Duke Salvia, MD 10/12/2015 8:47 AM   CT SCANS  CT CORONARY MORPH W/CTA COR W/SCORE 03/29/2020  Addendum 03/29/2020 10:56 PM ADDENDUM REPORT: 03/29/2020 22:53  CLINICAL DATA:  30M with exertional shortness of breath and arm discomfort.  EXAM: Cardiac/Coronary  CT  TECHNIQUE: The patient was scanned on a [employer].  FINDINGS: A 120 kV prospective scan was triggered in the descending thoracic aorta at  111 HU's. Axial non-contrast 3 mm slices were carried out through the heart. The data set was analyzed on a dedicated work station and scored using the Agatson method. Gantry rotation speed was 250 msecs and collimation was .6 mm. No beta blockade and 0.8 mg of sl NTG was given. The 3D data set was reconstructed in 5% intervals of the 67-82 % of the R-R cycle. Diastolic phases were analyzed on a dedicated work station using MPR, MIP and VRT modes. The patient received 80 cc of contrast.  Aorta: Mildly dilated. 3.7 mm. Mild calcification in the aortic root and descending aorta. No dissection.  Aortic Valve: Trileaflet.  No calcification.  Coronary Arteries:  Normal coronary origin.  Right dominance.  RCA is a large dominant artery that gives rise to PDA and 2 PLV branches. There is minimum (<25%) calcified plaque proximally and moderate (50-69%) mixed plaque in the distal RCA. There .  Left main is a large artery that gives rise to LAD and LCX arteries.  LAD is a large vessel that has diffuse, scattered plaque. There is mild (25-49%) mostly calcified plaque proximally. There is a focal area of moderate (50-69%) mixed plaque in the mid  LAD at the level of D3. There is no plaque is small D1 or D2. D3 is a small vessel with calcified plaque at the ostium. There are also small D4 and D5 vessels without significant plaque.  LCX is a non-dominant artery that gives rise to one large OM1 branch. There is no plaque.  Other findings:  Normal pulmonary vein drainage into the left atrium.  Normal let atrial appendage without a thrombus.  Normal size of the pulmonary artery.  IMPRESSION: 1. Coronary calcium score of 522. This was 72nd percentile for age and sex matched control.  2. Normal coronary origin with right dominance.  3. There is moderate (CAD RADS3) plaque in the mid LAD and distal RCA.  4. There is concern that the mid LAD lesion could be obstructive. Will send study  for FFRct.  Chilton Si, MD   Electronically Signed By: Chilton Si On: 03/29/2020 22:53  Narrative EXAM: OVER-READ INTERPRETATION  CT CHEST  The following report is an over-read performed by radiologist Dr. Trudie Reed of [facility] on 03/29/2020. This over-read does not include interpretation of cardiac or coronary anatomy or pathology. The coronary calcium score/coronary CTA interpretation by the cardiologist is attached.  COMPARISON:  None.  FINDINGS: Aortic atherosclerosis. Areas of cylindrical bronchiectasis in the medial aspect of the right lower lobe and inferior segment of the lingula. Within the visualized portions of the thorax there are no suspicious appearing pulmonary nodules or masses, there is no acute consolidative airspace disease, no pleural effusions, no pneumothorax and no lymphadenopathy. Visualized portions of the upper abdomen are unremarkable. There are no aggressive appearing lytic or blastic lesions noted in the visualized portions of the skeleton.  IMPRESSION: 1.  Aortic Atherosclerosis (ICD10-I70.0). 2. Areas of cylindrical bronchiectasis in the medial aspect of the right lower lobe and inferior segment of the lingula, likely sequela of prior infections.  Electronically Signed: By: Trudie Reed M.D. On: 03/29/2020 14:59          Risk Assessment/Calculations:    CHA2DS2-VASc Score = 4   This indicates a 4.8% annual risk of stroke. The patient's score is based upon: CHF History: 0 HTN History: 1 Diabetes History: 1 Stroke History: 0 Vascular Disease History: 1 Age Score: 1 Gender Score: 0            Physical Exam:   VS:  BP 116/78   Pulse 63   Ht 5\' 8"  (1.727 m)   Wt 191 lb (86.6 kg)   BMI 29.04 kg/m    Wt Readings from Last 3 Encounters:  06/29/23 191 lb (86.6 kg)  11/18/22 177 lb (80.3 kg)  10/27/22 170 lb 10.2 oz (77.4 kg)    GEN: Well nourished, well developed in no acute  distress NECK: No JVD; No carotid bruits CARDIAC: RRR, no murmurs, rubs, gallops RESPIRATORY:  Clear to auscultation without rales, wheezing or rhonchi  ABDOMEN: Soft, non-tender, non-distended EXTREMITIES:  No edema; No deformity   ASSESSMENT AND PLAN: .    CAD s/p PCI to LAD 03/2020 / HLD, LDL goal <70- No chest pain, EKG today no acute St/T wave changes. Continue GDMT including atorvastatin, metoprolol.  No aspirin due to chronic anticoagulation. Heart healthy diet and regular cardiovascular exercise encouraged.     Exertional dysnpea / Fatigue - Ongoing x 6 months. BMP, CBC unremarkable 03/2023 by PCP. Plan for echocardiogram. Notes some daytime somnolence but no snoring. He prefers to proceed with one test at at time. Consider home sleep study  at follow up if echo unremarkable.  Carotid stenosis - Mild 04/2019. Stable by duplex 03/2021. Continue Xarelto and Atorvastatin.    HTN - BP well controlled. Continue current antihypertensive regimen.     PAF / Chronic anticoagulation - CHA2DS2-VASc Score = 4 [CHF History: 0, HTN History: 1, Diabetes History: 1, Stroke History: 0, Vascular Disease History: 1, Age Score: 1, Gender Score: 0].  Therefore, the patient's annual risk of stroke is 4.8 %.     Continue metoprolol tartrate 50 mg twice daily, Xarelto 20 mg daily.  Denies bleeding complications.       Dispo: follow up 2 mos   Signed, Alver Sorrow, NP

## 2023-06-29 NOTE — Patient Instructions (Signed)
Medication Instructions:  Your physician recommends that you continue on your current medications as directed. Please refer to the Current Medication list given to you today.  *If you need a refill on your cardiac medications before your next appointment, please call your pharmacy*  Testing/Procedures: Your physician has requested that you have an echocardiogram. Echocardiography is a painless test that uses sound waves to create images of your heart. It provides your doctor with information about the size and shape of your heart and how well your heart's chambers and valves are working. This procedure takes approximately one hour. There are no restrictions for this procedure. Please do NOT wear cologne, perfume, aftershave, or lotions (deodorant is allowed). Please arrive 15 minutes prior to your appointment time.  Follow-Up: At [facility], you and your health needs are our priority.  As part of our continuing mission to provide you with exceptional heart care, we have created designated Provider Care Teams.  These Care Teams include your primary Cardiologist (physician) and Advanced Practice Providers (APPs -  Physician Assistants and Nurse Practitioners) who all work together to provide you with the care you need, when you need it.  We recommend signing up for the patient portal called "MyChart".  Sign up information is provided on this After Visit Summary.  MyChart is used to connect with patients for Virtual Visits (Telemedicine).  Patients are able to view lab/test results, encounter notes, upcoming appointments, etc.  Non-urgent messages can be sent to your provider as well.   To learn more about what you can do with MyChart, go to [email or web address].    Your next appointment:   2 months with Dr. Duke Salvia or Gillian Shields, NP

## 2023-07-20 ENCOUNTER — Other Ambulatory Visit (HOSPITAL_BASED_OUTPATIENT_CLINIC_OR_DEPARTMENT_OTHER): Payer: Medicare Other

## 2023-07-21 ENCOUNTER — Telehealth (HOSPITAL_BASED_OUTPATIENT_CLINIC_OR_DEPARTMENT_OTHER): Payer: Self-pay | Admitting: Family

## 2023-07-21 NOTE — Telephone Encounter (Signed)
Pt returning nurses phone call. Please advise ?

## 2023-07-21 NOTE — Telephone Encounter (Signed)
Patient returning call.

## 2023-07-21 NOTE — Telephone Encounter (Signed)
Left message for patient to call and discuss rescheduling the 07/20/23 missed Echocardiogram appointment

## 2023-08-11 ENCOUNTER — Ambulatory Visit (INDEPENDENT_AMBULATORY_CARE_PROVIDER_SITE_OTHER): Payer: Medicare Other

## 2023-08-11 DIAGNOSIS — R0609 Other forms of dyspnea: Secondary | ICD-10-CM

## 2023-08-11 LAB — ECHOCARDIOGRAM COMPLETE
Area-P 1/2: 2.56 cm2
P 1/2 time: 774 ms
S' Lateral: 1.9 cm

## 2023-08-24 ENCOUNTER — Other Ambulatory Visit (HOSPITAL_BASED_OUTPATIENT_CLINIC_OR_DEPARTMENT_OTHER): Payer: Self-pay | Admitting: Family

## 2023-08-24 DIAGNOSIS — E785 Hyperlipidemia, unspecified: Secondary | ICD-10-CM

## 2023-09-07 ENCOUNTER — Encounter (HOSPITAL_BASED_OUTPATIENT_CLINIC_OR_DEPARTMENT_OTHER): Payer: Self-pay | Admitting: Family

## 2023-09-07 ENCOUNTER — Ambulatory Visit (HOSPITAL_BASED_OUTPATIENT_CLINIC_OR_DEPARTMENT_OTHER): Payer: Medicare Other | Admitting: Family

## 2023-09-07 VITALS — BP 122/78 | HR 72 | Ht 67.0 in | Wt 195.8 lb

## 2023-09-07 DIAGNOSIS — I25118 Atherosclerotic heart disease of native coronary artery with other forms of angina pectoris: Secondary | ICD-10-CM

## 2023-09-07 DIAGNOSIS — D6859 Other primary thrombophilia: Secondary | ICD-10-CM | POA: Diagnosis not present

## 2023-09-07 DIAGNOSIS — I6523 Occlusion and stenosis of bilateral carotid arteries: Secondary | ICD-10-CM

## 2023-09-07 DIAGNOSIS — I480 Paroxysmal atrial fibrillation: Secondary | ICD-10-CM | POA: Diagnosis not present

## 2023-09-07 DIAGNOSIS — E785 Hyperlipidemia, unspecified: Secondary | ICD-10-CM

## 2023-09-07 DIAGNOSIS — Z6830 Body mass index (BMI) 30.0-30.9, adult: Secondary | ICD-10-CM

## 2023-09-07 NOTE — Patient Instructions (Signed)
Medication Instructions:  Continue your current medications.  *If you need a refill on your cardiac medications before your next appointment, please call your pharmacy*   Follow-Up: At [facility], you and your health needs are our priority.  As part of our continuing mission to provide you with exceptional heart care, we have created designated Provider Care Teams.  These Care Teams include your primary Cardiologist (physician) and Advanced Practice Providers (APPs -  Physician Assistants and Nurse Practitioners) who all work together to provide you with the care you need, when you need it.  We recommend signing up for the patient portal called "MyChart".  Sign up information is provided on this After Visit Summary.  MyChart is used to connect with patients for Virtual Visits (Telemedicine).  Patients are able to view lab/test results, encounter notes, upcoming appointments, etc.  Non-urgent messages can be sent to your provider as well.   To learn more about what you can do with MyChart, go to [email or web address].    Your next appointment:   As scheduled with Dr. Duke Salvia 11/09/23  Other Instructions  Heart Healthy Diet Recommendations: A low-salt diet is recommended. Meats should be grilled, baked, or boiled. Avoid fried foods. Focus on lean protein sources like fish or chicken with vegetables and fruits. The American Heart Association is a Chief Technology Officer!  American Heart Association Diet and Lifeystyle Recommendations   Exercise recommendations: The American Heart Association recommends 150 minutes of moderate intensity exercise weekly. Try 30 minutes of moderate intensity exercise 4-5 times per week. This could include walking, jogging, or swimming.   Exercises to do While Sitting Warm-up Before starting other exercises: Sit up as straight as you can. Have your knees bent at 90 degrees, which is the shape of the capital letter "L." Keep your feet flat on the floor. Sit  at the front edge of your chair, if you can. Pull in (tighten) the muscles in your abdomen and stretch your spine and neck as straight as you can. Hold this position for a few minutes. Breathe in and out evenly. Try to concentrate on your breathing, and relax your mind.  Stretching Exercise A: Arm stretch Hold your arms out straight in front of your body. Bend your hands at the wrist with your fingers pointing up, as if signaling someone to stop. Notice the slight tension in your forearms as you hold the position. Keeping your arms out and your hands bent, rotate your hands outward as far as you can and hold this stretch. Aim to have your thumbs pointing up and your pinkie fingers pointing down. Slowly repeat arm stretches for one minute as tolerated. Exercise B: Leg stretch If you can move your legs, try to "draw" letters on the floor with the toes of your foot. Write your name with one foot. Write your name with the toes of your other foot. Slowly repeat the movements for one minute as tolerated. Exercise C: Reach for the sky Reach your hands as far over your head as you can to stretch your spine. Move your hands and arms as if you are climbing a rope. Slowly repeat the movements for one minute as tolerated.  Range of motion exercises Exercise A: Shoulder roll Let your arms hang loosely at your sides. Lift just your shoulders up toward your ears, then let them relax back down. When your shoulders feel loose, rotate your shoulders in backward and forward circles. Do shoulder rolls slowly for one minute as tolerated. Exercise B: March  in place As if you are marching, pump your arms and lift your legs up and down. Lift your knees as high as you can. If you are unable to lift your knees, just pump your arms and move your ankles and feet up and down. March in place for one minute as tolerated. Exercise C: Seated jumping jacks Let your arms hang down straight. Keeping your arms straight,  lift them up over your head. Aim to point your fingers to the ceiling. While you lift your arms, straighten your legs and slide your heels along the floor to your sides, as wide as you can. As you bring your arms back down to your sides, slide your legs back together. If you are unable to use your legs, just move your arms. Slowly repeat seated jumping jacks for one minute as tolerated.  Strengthening exercises Exercise A: Shoulder squeeze Hold your arms straight out from your body to your sides, with your elbows bent and your fists pointed at the ceiling. Keeping your arms in the bent position, move them forward so your elbows and forearms meet in front of your face. Open your arms back out as wide as you can with your elbows still bent, until you feel your shoulder blades squeezing together. Hold for 5 seconds. Slowly repeat the movements forward and backward for one minute as tolerated.

## 2023-09-07 NOTE — Progress Notes (Signed)
Cardiology Office Note:  .   Date:  09/07/2023  ID:  David Weaver, DOB December 25, 1950, MRN [id number] PCP: David Chick, MD  [facility] Providers Cardiologist:  David Si, MD    History of Present Illness: David Weaver   David Weaver is a 72 y.o. male  with a hx of CAD s/p LAD PCI, PAF, HTN, mild carotid stenosis, lupus.   He was seen in clinic September 2016 for presurgical assessment and granted clearance.  During shoulder surgery he went into atrial fibrillation with RVR.  Returned to NSR without intervention.  He was started on Eliquis and referred to cardiology.  Subsequent 30-day monitor with recurrent atrial fibrillation and anticoagulation was continued.  Exercise Myoview March 2019 LVEF 46% with global hypokinesis and no ischemia.  He achieved 10.5 METS on Bruce protocol.  He was referred for echo with LVEF 55 to 60%, grade 1 diastolic dysfunction.  He had carotid Dopplers 2020 revealing 1-39% ICA stenosis bilaterally.  He had coronary CTA with significant obstruction in the mid LAD with left heart cath revealing 30% ostial LAD and 80% mid LAD stenosis successfully stented 03/2020.  Carotid duplex 04/01/2021 with bilateral 1-39% stenosis.    Echo which was performed 09/19/21 and showed LVEF 55%, grade 1 diastolic dysfunction, normal RVSF, trivial AI. His shortness of breath was attributed to deconditioning and exercise recommended.  Losartan increased to 100 mg daily for BP control.  Underwent left shoulder arthroplasty 05/2022.  Admitted to also 2022 with perforated diverticulitis treated conservatively.   Last seen 11/08/2022 by David Weaver.  His BP was well-controlled.  Reported no angina, maintaining sinus rhythm and given clearance to hold Xarelto prior to colonoscopy.  Zetia added as LDL not at goal of less than 70.   At PCP visit 03/2023 amlodipine held due to hypotension. It was later resumed as BP elevated.   Last seen 06/29/1943.  Echocardiogram ordered for 59-month history of  exertional dyspnea, fatigue.  Echo 9/24 low normal LVEF 50 to 55%, calcification of heart valves but no significant stenosis nor regurgitation.  It was overall unchanged from prior.   Presents today for follow up. 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 Exertional dyspnea with more than usual activity is stable at baseline. Reports no shortness of breath at rest nor dyspnea on exertion with usual activities. Feels his energy level is improving. He does take it slow in the morning watching television then goes to his shop after lunch. Sleeping well at night and waking feeling rested. Reports no chest pain, pressure, or tightness. No edema, orthopnea, PND. Reports no palpitations.     ROS: Please see the history of present illness.    All other systems reviewed and are negative.   Studies Reviewed: .        Cardiac Studies & Procedures   CARDIAC CATHETERIZATION  CARDIAC CATHETERIZATION 04/06/2020  Narrative  The left ventricular systolic function is normal.  LV end diastolic pressure is normal.  The left ventricular ejection fraction is 55-65% by visual estimate.  Mid LAD lesion is 80% stenosed.  Post intervention, there is a 0% residual stenosis.  A drug-eluting stent was successfully placed using a STENT RESOLUTE ONYX 3.0X34.  Ost LAD lesion is 30% stenosed.  1.  Severe one-vessel coronary artery disease involving mid LAD with 80% moderately calcified eccentric stenosis.  No other obstructive disease. 2.  Normal LV systolic function and left ventricular end-diastolic  pressure. 3.  Successful angioplasty and drug-eluting stent placement to the mid LAD.  PCI indication is class III angina on maximal medical therapy and intermediate risk noninvasive testing.  Recommendations: Xarelto can be resumed tomorrow. Given that the patient is long-term anticoagulation with Xarelto, recommend clopidogrel monotherapy without aspirin to minimize risk of  bleeding. I increased atorvastatin to 40 mg daily. The patient is a candidate for same-day discharge.  Findings Coronary Findings Diagnostic  Dominance: Right  Left Main Vessel is angiographically normal.  Left Anterior Descending Ost LAD lesion is 30% stenosed. Mid LAD lesion is 80% stenosed. The lesion is type C. The lesion is moderately calcified.  Left Circumflex Vessel is small. Vessel is angiographically normal.  First Obtuse Marginal Branch Vessel is angiographically normal.  Second Obtuse Marginal Branch Vessel is small in size. Vessel is angiographically normal.  Third Obtuse Marginal Branch Vessel is small in size. Vessel is angiographically normal.  Right Coronary Artery The vessel exhibits minimal luminal irregularities.  Right Posterior Descending Artery The vessel exhibits minimal luminal irregularities.  Right Posterior Atrioventricular Artery Vessel is angiographically normal.  Intervention  Mid LAD lesion Stent Lesion length:  30 mm. CATH VISTA GUIDE 6FR XBLAD3.5 guide catheter was inserted. Lesion crossed with guidewire using a WIRE RUNTHROUGH .[id number]. Pre-stent angioplasty was performed using a BALLOON SAPPHIRE 2.5X15. Maximum pressure:  10 atm. Inflation time:  30 sec. A drug-eluting stent was successfully placed using a STENT RESOLUTE ONYX 3.0X34. Maximum pressure: 12 atm. Inflation time: 20 sec. Post-stent angioplasty was performed using a BALLOON SAPPHIRE [place] 3.25X18. Maximum pressure:  20 atm. Inflation time:  30 sec. Noncompliant balloon was inflated to 12 atm distally and a maximum of 20 atm in the mid segment Post-Intervention Lesion Assessment The intervention was successful. Pre-interventional TIMI flow is 3. Post-intervention TIMI flow is 3. No complications occurred at this lesion. There is a 0% residual stenosis post intervention.   STRESS TESTS  MYOCARDIAL PERFUSION IMAGING 02/10/2018  Narrative  The left ventricular ejection  fraction is mildly decreased (45-54%).  Nuclear stress EF: 46%. Mild generalized hypokinesis  There was no ST segment deviation noted during stress.  The study is normal.  This is a low risk study. No ischemia. Prior ECHO 2016 - normal EF.  Donato Schultz, MD   ECHOCARDIOGRAM  ECHOCARDIOGRAM COMPLETE 08/11/2023  Narrative ECHOCARDIOGRAM REPORT    Patient Name:   NIVIN BRANIFF Date of Exam: 08/11/2023 Medical Rec #:  [id number]     Height:       68.0 in Accession #:    [id number]    Weight:       191.0 lb Date of Birth:  Aug 06, 1951     BSA:          2.004 m Patient Age:    72 years      BP:           116/78 mmHg Patient Gender: M             HR:           64 bpm. Exam Location:  Outpatient  Procedure: 2D Echo, 3D Echo, Cardiac Doppler, Color Doppler and Strain Analysis  Indications:    Dyspnea  History:        Patient has prior history of Echocardiogram examinations, most recent 09/19/2021. CAD, COPD; Risk Factors:Diabetes and Former Smoker.  Sonographer:    Jeryl Columbia RDCS Referring Phys: [id number] Jerney Baksh S Ignacio Lowder  IMPRESSIONS   1. Left ventricular ejection fraction, by estimation,  is 50 to 55%. The left ventricle has low normal function. The left ventricle has no regional wall motion abnormalities. Left ventricular diastolic parameters were normal. 2. Right ventricular systolic function is normal. The right ventricular size is normal. There is normal pulmonary artery systolic pressure. 3. The mitral valve is normal in structure. No evidence of mitral valve regurgitation. No evidence of mitral stenosis. Moderate mitral annular calcification. 4. The aortic valve is calcified. There is moderate calcification of the aortic valve. Aortic valve regurgitation is trivial. 5. The inferior vena cava is normal in size with greater than 50% respiratory variability, suggesting right atrial pressure of 3 mmHg.  Comparison(s): No significant change from prior study.  FINDINGS Left  Ventricle: Left ventricular ejection fraction, by estimation, is 50 to 55%. The left ventricle has low normal function. The left ventricle has no regional wall motion abnormalities. Global longitudinal strain performed but not reported based on interpreter judgement due to suboptimal tracking. The left ventricular internal cavity size was normal in size. There is no left ventricular hypertrophy. Left ventricular diastolic parameters were normal.  Right Ventricle: The right ventricular size is normal. Right vetricular wall thickness was not well visualized. Right ventricular systolic function is normal. There is normal pulmonary artery systolic pressure. The tricuspid regurgitant velocity is 1.68 m/s, and with an assumed right atrial pressure of 3 mmHg, the estimated right ventricular systolic pressure is 14.3 mmHg.  Left Atrium: Left atrial size was normal in size.  Right Atrium: Right atrial size was normal in size.  Pericardium: There is no evidence of pericardial effusion.  Mitral Valve: The mitral valve is normal in structure. Moderate mitral annular calcification. No evidence of mitral valve regurgitation. No evidence of mitral valve stenosis.  Tricuspid Valve: The tricuspid valve is normal in structure. Tricuspid valve regurgitation is trivial. No evidence of tricuspid stenosis.  Aortic Valve: The aortic valve is calcified. There is moderate calcification of the aortic valve. Aortic valve regurgitation is trivial. Aortic regurgitation PHT measures 774 msec.  Pulmonic Valve: The pulmonic valve was not well visualized. Pulmonic valve regurgitation is trivial. No evidence of pulmonic stenosis.  Aorta: The aortic root, ascending aorta and aortic arch are all structurally normal, with no evidence of dilitation or obstruction.  Venous: The inferior vena cava is normal in size with greater than 50% respiratory variability, suggesting right atrial pressure of 3 mmHg.  IAS/Shunts: The atrial  septum is grossly normal.   LEFT VENTRICLE PLAX 2D LVIDd:         3.73 cm   Diastology LVIDs:         1.90 cm   LV e' medial:    6.31 cm/s LV PW:         1.07 cm   LV E/e' medial:  6.4 LV IVS:        1.11 cm   LV e' lateral:   8.38 cm/s LVOT diam:     2.10 cm   LV E/e' lateral: 4.8 LV SV:         78 LV SV Index:   39 LVOT Area:     3.46 cm   RIGHT VENTRICLE RV Basal diam:  3.68 cm RV Mid diam:    2.89 cm RV S prime:     11.50 cm/s TAPSE (M-mode): 2.1 cm  LEFT ATRIUM             Index        RIGHT ATRIUM  Index LA diam:        5.20 cm 2.59 cm/m   RA Area:     16.70 cm LA Vol (A2C):   40.5 ml 20.21 ml/m  RA Volume:   47.90 ml  23.90 ml/m LA Vol (A4C):   40.0 ml 19.96 ml/m LA Biplane Vol: 43.4 ml 21.66 ml/m AORTIC VALVE LVOT Vmax:   99.60 cm/s LVOT Vmean:  61.300 cm/s LVOT VTI:    0.224 m AI PHT:      774 msec  AORTA Ao Root diam: 3.80 cm Ao Asc diam:  3.75 cm  MITRAL VALVE               TRICUSPID VALVE MV Area (PHT): 2.56 cm    TR Peak grad:   11.3 mmHg MV Decel Time: 296 msec    TR Vmax:        168.00 cm/s MV E velocity: 40.40 cm/s MV A velocity: 79.10 cm/s  SHUNTS MV E/A ratio:  0.51        Systemic VTI:  0.22 m Systemic Diam: 2.10 cm  Jodelle Red MD Electronically signed by Jodelle Red MD Signature Date/Time: 08/11/2023/4:50:46 PM    Final    MONITORS  CARDIAC EVENT MONITOR 09/04/2015  Narrative Images from the original result were not included.    30 Day Event Monitor  Quality: Fair.  Baseline artifact.  Sinus rhythm, sinus tachycardia and atrial fibrillation with rates in the 60s-70s were noted. No pauses or other arrhythmias were noted.   Tiffany C. Duke Salvia, MD 10/12/2015 8:47 AM   CT SCANS  CT CORONARY MORPH W/CTA COR W/SCORE 03/29/2020  Addendum 03/29/2020 10:56 PM ADDENDUM REPORT: 03/29/2020 22:53  CLINICAL DATA:  55M with exertional shortness of breath and  arm discomfort.  EXAM: Cardiac/Coronary  CT  TECHNIQUE: The patient was scanned on a [employer].  FINDINGS: A 120 kV prospective scan was triggered in the descending thoracic aorta at 111 HU's. Axial non-contrast 3 mm slices were carried out through the heart. The data set was analyzed on a dedicated work station and scored using the Agatson method. Gantry rotation speed was 250 msecs and collimation was .6 mm. No beta blockade and 0.8 mg of sl NTG was given. The 3D data set was reconstructed in 5% intervals of the 67-82 % of the R-R cycle. Diastolic phases were analyzed on a dedicated work station using MPR, MIP and VRT modes. The patient received 80 cc of contrast.  Aorta: Mildly dilated. 3.7 mm. Mild calcification in the aortic root and descending aorta. No dissection.  Aortic Valve: Trileaflet.  No calcification.  Coronary Arteries:  Normal coronary origin.  Right dominance.  RCA is a large dominant artery that gives rise to PDA and 2 PLV branches. There is minimum (<25%) calcified plaque proximally and moderate (50-69%) mixed plaque in the distal RCA. There .  Left main is a large artery that gives rise to LAD and LCX arteries.  LAD is a large vessel that has diffuse, scattered plaque. There is mild (25-49%) mostly calcified plaque proximally. There is a focal area of moderate (50-69%) mixed plaque in the mid LAD at the level of D3. There is no plaque is small D1 or D2. D3 is a small vessel with calcified plaque at the ostium. There are also small D4 and D5 vessels without significant plaque.  LCX is a non-dominant artery that gives rise to one large OM1 branch. There is no plaque.  Other findings:  Normal pulmonary  vein drainage into the left atrium.  Normal let atrial appendage without a thrombus.  Normal size of the pulmonary artery.  IMPRESSION: 1. Coronary calcium score of 522. This was 72nd percentile for age and sex matched control.  2.  Normal coronary origin with right dominance.  3. There is moderate (CAD RADS3) plaque in the mid LAD and distal RCA.  4. There is concern that the mid LAD lesion could be obstructive. Will send study for FFRct.  David Si, MD   Electronically Signed By: David Weaver On: 03/29/2020 22:53  Narrative EXAM: OVER-READ INTERPRETATION  CT CHEST  The following report is an over-read performed by radiologist Dr. Trudie Reed of [facility] on 03/29/2020. This over-read does not include interpretation of cardiac or coronary anatomy or pathology. The coronary calcium score/coronary CTA interpretation by the cardiologist is attached.  COMPARISON:  None.  FINDINGS: Aortic atherosclerosis. Areas of cylindrical bronchiectasis in the medial aspect of the right lower lobe and inferior segment of the lingula. Within the visualized portions of the thorax there are no suspicious appearing pulmonary nodules or masses, there is no acute consolidative airspace disease, no pleural effusions, no pneumothorax and no lymphadenopathy. Visualized portions of the upper abdomen are unremarkable. There are no aggressive appearing lytic or blastic lesions noted in the visualized portions of the skeleton.  IMPRESSION: 1.  Aortic Atherosclerosis (ICD10-I70.0). 2. Areas of cylindrical bronchiectasis in the medial aspect of the right lower lobe and inferior segment of the lingula, likely sequela of prior infections.  Electronically Signed: By: Trudie Reed M.D. On: 03/29/2020 14:59          Risk Assessment/Calculations:    CHA2DS2-VASc Score = 4   This indicates a 4.8% annual risk of stroke. The patient's score is based upon: CHF History: 0 HTN History: 1 Diabetes History: 1 Stroke History: 0 Vascular Disease History: 1 Age Score: 1 Gender Score: 0            Physical Exam:   VS:  BP 122/78   Pulse 72   Ht 5\' 7"  (1.702 m)   Wt 195 lb 12.8 oz (88.8 kg)    SpO2 94%   BMI 30.67 kg/m    Wt Readings from Last 3 Encounters:  09/07/23 195 lb 12.8 oz (88.8 kg)  06/29/23 191 lb (86.6 kg)  11/18/22 177 lb (80.3 kg)    GEN: Well nourished, overweight, well developed in no acute distress NECK: No JVD; No carotid bruits CARDIAC: RRR, no murmurs, rubs, gallops RESPIRATORY:  Clear to auscultation without rales, wheezing or rhonchi  ABDOMEN: Soft, non-tender, non-distended EXTREMITIES:  No edema; No deformity   ASSESSMENT AND PLAN: .    CAD s/p PCI to LAD 03/2020 / HLD, LDL goal <70-  Stable with no anginal symptoms. No indication for ischemic evaluation.   GDMT including atorvastatin, metoprolol.  No aspirin due to chronic anticoagulation. Heart healthy diet and regular cardiovascular exercise encouraged.    Exertional dysnpea / Fatigue -reported 15-month history of exertional dyspnea, fatigue at visit 06/2023.  BMP, CBC unremarkable 03/2023 by PCP.  Echo 08/21/2023 low normal LVEF 50-55%, no significant valvular abnormalities.  Notes energy level improved from prior. No daytime somnolence, will defer sleep study as symptoms improving. No indication for further workup at this time.   Carotid stenosis - Mild 04/2019. Stable by duplex 03/2021. Continue Xarelto and Atorvastatin.   Obesity - Weight loss via diet and exercise encouraged. Discussed the impact being overweight would have on cardiovascular  risk. Was provided list of sitting exercises and encouraged to start. We discussed dietary tactics such as limiting sweets and focusing on protein sources.   HTN - BP well controlled. Continue current antihypertensive regimen.    PAF / Chronic anticoagulation - RRR by auscultation. CHA2DS2-VASc Score = 4 [CHF History: 0, HTN History: 1, Diabetes History: 1, Stroke History: 0, Vascular Disease History: 1, Age Score: 1, Gender Score: 0].  Therefore, the patient's annual risk of stroke is 4.8 %.     Continue metoprolol tartrate 50 mg twice daily, Xarelto 20 mg daily.   Denies bleeding complications.06/30/23 Hb 14.5, K 0.9.        Dispo: follow up as scheduled 11/2023 with David Weaver   Signed, Alver Sorrow, NP

## 2023-11-09 ENCOUNTER — Ambulatory Visit (INDEPENDENT_AMBULATORY_CARE_PROVIDER_SITE_OTHER): Payer: Medicare Other | Admitting: Cardiovascular Disease

## 2023-11-09 ENCOUNTER — Encounter (HOSPITAL_BASED_OUTPATIENT_CLINIC_OR_DEPARTMENT_OTHER): Payer: Self-pay | Admitting: Cardiovascular Disease

## 2023-11-09 VITALS — BP 128/76 | HR 62 | Ht 67.0 in | Wt 190.8 lb

## 2023-11-09 DIAGNOSIS — I480 Paroxysmal atrial fibrillation: Secondary | ICD-10-CM | POA: Diagnosis not present

## 2023-11-09 DIAGNOSIS — I10 Essential (primary) hypertension: Secondary | ICD-10-CM

## 2023-11-09 NOTE — Patient Instructions (Signed)
Medication Instructions:  Your physician recommends that you continue on your current medications as directed. Please refer to the Current Medication list given to you today.  *If you need a refill on your cardiac medications before your next appointment, please call your pharmacy*  Follow-Up: At [facility], you and your health needs are our priority.  As part of our continuing mission to provide you with exceptional heart care, we have created designated Provider Care Teams.  These Care Teams include your primary Cardiologist (physician) and Advanced Practice Providers (APPs -  Physician Assistants and Nurse Practitioners) who all work together to provide you with the care you need, when you need it.  We recommend signing up for the patient portal called "MyChart".  Sign up information is provided on this After Visit Summary.  MyChart is used to connect with patients for Virtual Visits (Telemedicine).  Patients are able to view lab/test results, encounter notes, upcoming appointments, etc.  Non-urgent messages can be sent to your provider as well.   To learn more about what you can do with MyChart, go to [email or web address].    Your next appointment:   6 months with Gillian Shields, NP 1 year with Dr. Duke Salvia

## 2023-11-09 NOTE — Progress Notes (Signed)
Cardiology Office Note:  .   Date:  12/01/2023  ID:  David Weaver, DOB 1951/04/28, MRN [id number] PCP: Ethelda Chick, MD  [facility] Providers Cardiologist:  Chilton Si, MD    History of Present Illness: [name]   David Weaver is a 72 y.o. male with CAD s/p LAD PCI, paroxysmal atrial fibrillation, hypertension, mild carotid stenosis, and lupus who presents for follow up.  David Weaver was seen in clinic on 08/07/15 for a presurgical assessment.  At that time he was cleared for shoulder surgery.  During surgery David Weaver went into atrial fibrillation with RVR.  He reportedly went back into sinus rhythm without intervention. He was started on Eliquis 5mg  daily and referred to follow up with cardiology.  He subsequently had a 30 day event monitor to determine whether he needed to continue on anticoagulation. The monitor did reveal recurrent atrial fibrillation so anticoagulation was continued.  David Weaver reported exertional chest pain.  He was referred for an exercise Myoview 01/2018 that revealed LVEF 46% with global hypokinesis.  There was no ischemia.   He achieved 10.5 METS on a Bruce protocol.  He was then referred for an echo that revealed LVEF 55 to 60% with grade 1 diastolic dysfunction.  At that appointment he also reported dizziness and had carotid Dopplers that revealed 1 to 39% ICA stenosis bilaterally.  David Weaver reported exertional chest pain.  He was referred for a coronary CT-A that revealed significant obstruction in the mid LAD.  He had a left heart cath that revealed 30% ostial LAD and 80% mid LAD that was successfully stented.     At his last appointment he reported shortness of breath with walking uphill, ongoing for one month. He denied exertional chest pain. Echo 08/2021 revealed LVEF 55% and grade 1 diastolic dysfunction. There was mild aortic valve sclerosis but no stenosis. At home blood pressures averaged 150s/80s. Losartan was increased to 100 mg. He had a mechanical fall two  weeks prior without preceding symptoms. His last Afib episode was 6 months prior, lasting for a few minutes. He followed up with Gillian Shields, NP 10/2021 and his blood pressure was averaging in the 140s but ranged in the 110s-170s. He continued to complain of shortness of breath and it was thought to be due to deconditioning so he was encouraged to exercise. He had left shoulder arthroplasty 05/2022. He was admitted 10/2022 with perforated diverticulitis. He was treated conservatively with IV antibiotics, with plans for outpatient colonoscopy in 4-6 weeks.   Today, he states he is feeling better overall since his recent hospitalization. After that time, he had an episode of chest pain that he describes as a "different feeling than usual." He was walking out of a store at the time, and the episode was brief. Sometimes he has blurred vision, and may need to put his hand on the wall due to dizziness. These symptoms usually occur too quickly for him to check his blood pressure. He feels alright when sitting to standing; the dizziness would occur after walking for a time. He denies any falls. For exercise he is walking every day weather permitting. Occasionally he experiences pain in his bilateral shins while walking. Sometimes the pain is localized in his frontal thighs especially while going uphill. He denies exertional chest pain and confirms that he would be able to walk a distance of 4 blocks. The only LE edema he has noticed is mild and in his feet. He denies any palpitations,  shortness of breath, headaches, syncope, orthopnea, or PND.  David Weaver presents with a chief complaint of back pain, which is actually localized to the heel. The pain has been significant enough to limit his mobility and exercise.  In terms of his cardiac history, he denies any recent episodes of chest pain, pressure, or noticeable episodes of atrial fibrillation. His breathing is described as "not the best," but has remained stable,  with no significant changes recently. He has noticed some swelling in his ankles, which improves with elevation overnight. He denies any nocturnal dyspnea.  He also reports an issue with bending over to pick up objects, which causes him to have to straighten up to catch his breath. This issue is more pronounced due to carrying weight in his belly.  His medication regimen includes atorvastatin, amlodipine, losartan, metoprolol, and Xarelto. He reports good adherence to this regimen. His most recent cholesterol and blood pressure readings were within target ranges. His last HbA1c was 6.4, placing him in the prediabetes range. He was offered Ozempic for this issue, but declined due to cost concerns.      ROS:  As per HPI  Studies Reviewed: .       Echo 08/2023:  1. Left ventricular ejection fraction, by estimation, is 50 to 55%. The  left ventricle has low normal function. The left ventricle has no regional  wall motion abnormalities. Left ventricular diastolic parameters were  normal.   2. Right ventricular systolic function is normal. The right ventricular  size is normal. There is normal pulmonary artery systolic pressure.   3. The mitral valve is normal in structure. No evidence of mitral valve  regurgitation. No evidence of mitral stenosis. Moderate mitral annular  calcification.   4. The aortic valve is calcified. There is moderate calcification of the  aortic valve. Aortic valve regurgitation is trivial.   5. The inferior vena cava is normal in size with greater than 50%  respiratory variability, suggesting right atrial pressure of 3 mmHg.   Risk Assessment/Calculations:    CHA2DS2-VASc Score = 3   This indicates a 3.2% annual risk of stroke. The patient's score is based upon: CHF History: 0 HTN History: 1 Diabetes History: 0 Stroke History: 0 Vascular Disease History: 1 Age Score: 1 Gender Score: 0        Physical Exam:   VS:  BP 128/76 (BP Location: Left Arm, Patient  Position: Sitting, Cuff Size: Large)   Pulse 62   Ht 5\' 7"  (1.702 m)   Wt 190 lb 12.8 oz (86.5 kg)   SpO2 93%   BMI 29.88 kg/m  , BMI Body mass index is 29.88 kg/m. GENERAL:  Well appearing HEENT: Pupils equal round and reactive, fundi not visualized, oral mucosa unremarkable NECK:  No jugular venous distention, waveform within normal limits, carotid upstroke brisk and symmetric, no bruits, no thyromegaly LUNGS:  Clear to auscultation bilaterally HEART:  RRR.  PMI not displaced or sustained,S1 and S2 within normal limits, no S3, no S4, no clicks, no rubs, no murmurs ABD:  Flat, positive bowel sounds normal in frequency in pitch, no bruits, no rebound, no guarding, no midline pulsatile mass, no hepatomegaly, no splenomegaly EXT:  2 plus pulses throughout, no edema, no cyanosis no clubbing SKIN:  No rashes no nodules NEURO:  Cranial nerves II through XII grossly intact, motor grossly intact throughout PSYCH:  Cognitively intact, oriented to person place and time   ASSESSMENT AND PLAN: .    # Back Pain Scheduled  for a steroid injection to alleviate pain. Pain currently limiting mobility and exercise. -Continue with planned steroid injection.  # Paroxysmal Atrial Fibrillation No recent episodes.  Medications for rate control and anticoagulation are being taken. -Continue current medications (Xarelto and Metoprolol).  # Hypertension Well-controlled on current medications (Amlodipine, Losartan, Metoprolol). -Continue current medications.  # Hyperlipidemia LDL well controlled at 33 on Atorvastatin. -Continue Atorvastatin.  # Prediabetes Last A1c was 6.4. Discussed potential future use of Ozempic if A1c increases to 6.5 or above. -Monitor A1c levels.  # General Health Maintenance / Followup Plans -Return visit in 6 months with nurse, and in 1 year with physician. -Encouraged to increase exercise as back pain improves.       Signed, Chilton Si, MD

## 2023-12-01 ENCOUNTER — Encounter (HOSPITAL_BASED_OUTPATIENT_CLINIC_OR_DEPARTMENT_OTHER): Payer: Self-pay | Admitting: Cardiovascular Disease

## 2023-12-04 ENCOUNTER — Other Ambulatory Visit (HOSPITAL_BASED_OUTPATIENT_CLINIC_OR_DEPARTMENT_OTHER): Payer: Self-pay | Admitting: Family

## 2023-12-04 DIAGNOSIS — E785 Hyperlipidemia, unspecified: Secondary | ICD-10-CM

## 2023-12-24 ENCOUNTER — Other Ambulatory Visit (HOSPITAL_BASED_OUTPATIENT_CLINIC_OR_DEPARTMENT_OTHER): Payer: Self-pay | Admitting: Family

## 2023-12-24 DIAGNOSIS — E785 Hyperlipidemia, unspecified: Secondary | ICD-10-CM

## 2024-03-25 ENCOUNTER — Other Ambulatory Visit (HOSPITAL_BASED_OUTPATIENT_CLINIC_OR_DEPARTMENT_OTHER): Payer: Self-pay | Admitting: Family

## 2024-03-25 DIAGNOSIS — E785 Hyperlipidemia, unspecified: Secondary | ICD-10-CM

## 2024-04-29 ENCOUNTER — Encounter: Payer: Self-pay | Admitting: Podiatry

## 2024-04-29 ENCOUNTER — Ambulatory Visit: Admitting: Podiatry

## 2024-04-29 ENCOUNTER — Other Ambulatory Visit: Payer: Self-pay | Admitting: Podiatry

## 2024-04-29 DIAGNOSIS — M79672 Pain in left foot: Secondary | ICD-10-CM | POA: Diagnosis not present

## 2024-04-29 DIAGNOSIS — B351 Tinea unguium: Secondary | ICD-10-CM | POA: Diagnosis not present

## 2024-04-29 DIAGNOSIS — M25572 Pain in left ankle and joints of left foot: Secondary | ICD-10-CM | POA: Diagnosis not present

## 2024-04-29 DIAGNOSIS — M79671 Pain in right foot: Secondary | ICD-10-CM

## 2024-04-29 NOTE — Progress Notes (Signed)
   Subjective:    HPI Presents for follow-up onychomycosis treatment with p.o. Lamisil.  No problems taking medicine with no side effects noted.  Tenderness around toes with walking and wearing shoes.  Close pain around first TMT joint on the left foot.  Has some aching at times but the sharp pain is gone.  Has responded well to injection.  Complains of rash on both arms.  Began about 2 weeks ago.  He is finished the Lamisil.  Explained that I do not think it is a drug rash given the timing.  He does have an appointment with a dermatologist to get it examined.  Objective:  Physical Exam   General: AAO x3, NAD  Vascular: DP and PT pulses palpable bilaterally.  Immedate capillary fill time digits. No significant lower extremity edema bilaterally.  Dermatological: Onychomycotic mycotic changes nails 1 through 5 with discoloration nail and subungual debris and thickening of the nail.  10% Clearance of onychomycotic nail changes noted. Tenderness with walking and wearing shoes.  Neruologic: Grossly intact B/L  Musculoskeletal: Slight soreness to palpation and range of motion of the first tarsometatarsal joint left foot. Assessment:  Painful onychomycotic nails 1 through 5 bilaterally. Pain feet b/l Arthralgia first tarsometatarsal joint left foot.     Plan:  -Established office visit level 3 for evaluation and management -Patient is tolerating Lamisil treatment for onychomycotic nails well.  No side effects noted. Will continue this treatment. -Rx: Lamisil 250 mg p.o. daily - Labs ordered today for liver function tests to monitor for any hepatic side effects from the Lamisil. -Wear good supportive shoes.  Use Voltaren  gel as needed.  Ice as needed.  - Return 3 months follow-up Lamisil

## 2024-04-29 NOTE — Addendum Note (Signed)
 Addended by: Reche Canales on: 04/29/2024 01:11 PM   Modules accepted: Orders

## 2024-04-30 LAB — HEPATIC FUNCTION PANEL
ALT: 37 IU/L (ref 0–44)
AST: 32 IU/L (ref 0–40)
Albumin: 4.1 g/dL (ref 3.8–4.8)
Alkaline Phosphatase: 85 IU/L (ref 44–121)
Bilirubin Total: 0.3 mg/dL (ref 0.0–1.2)
Bilirubin, Direct: 0.14 mg/dL (ref 0.00–0.40)
Total Protein: 6.2 g/dL (ref 6.0–8.5)

## 2024-05-05 ENCOUNTER — Other Ambulatory Visit (HOSPITAL_BASED_OUTPATIENT_CLINIC_OR_DEPARTMENT_OTHER): Payer: Self-pay | Admitting: Family

## 2024-05-05 DIAGNOSIS — E785 Hyperlipidemia, unspecified: Secondary | ICD-10-CM

## 2024-06-17 ENCOUNTER — Encounter (HOSPITAL_BASED_OUTPATIENT_CLINIC_OR_DEPARTMENT_OTHER): Payer: Self-pay | Admitting: Family

## 2024-06-17 ENCOUNTER — Ambulatory Visit (HOSPITAL_BASED_OUTPATIENT_CLINIC_OR_DEPARTMENT_OTHER): Admitting: Family

## 2024-06-17 VITALS — BP 122/74 | HR 77 | Ht 64.0 in | Wt 190.0 lb

## 2024-06-17 DIAGNOSIS — I480 Paroxysmal atrial fibrillation: Secondary | ICD-10-CM | POA: Diagnosis not present

## 2024-06-17 DIAGNOSIS — D6859 Other primary thrombophilia: Secondary | ICD-10-CM

## 2024-06-17 DIAGNOSIS — I10 Essential (primary) hypertension: Secondary | ICD-10-CM

## 2024-06-17 DIAGNOSIS — E785 Hyperlipidemia, unspecified: Secondary | ICD-10-CM | POA: Diagnosis not present

## 2024-06-17 DIAGNOSIS — I25118 Atherosclerotic heart disease of native coronary artery with other forms of angina pectoris: Secondary | ICD-10-CM

## 2024-06-17 NOTE — Patient Instructions (Addendum)
 Medication Instructions:  NO CHANGES *If you need a refill on your cardiac medications before your next appointment, please call your pharmacy*  Lab Work: NO LABS If you have labs (blood work) drawn today and your tests are completely normal, you will receive your results only by: MyChart Message (if you have MyChart) OR A paper copy in the mail If you have any lab test that is abnormal or we need to change your treatment, we will call you to review the results.  Testing/Procedures: NO TESTING  Follow-Up: At [facility], you and your health needs are our priority.  As part of our continuing mission to provide you with exceptional heart care, our providers are all part of one team.  This team includes your primary Cardiologist (physician) and Advanced Practice Providers or APPs (Physician Assistants and Nurse Practitioners) who all work together to provide you with the care you need, when you need it.  Your next appointment:   6 month(s)  Provider:   Annabella Scarce, MD or Gabe Finder, NP    Other Instructions Recommend trying Biotene Dry Mouth rinse in the evening to help stop drinkign fluid 30 minutes prior to bed. Hopefully that will help you to not have to use the restroom as often at night. You could also use in the morning.   Recommend trying seated exercises. Could look up 'seated exercises for seniors' on Youtube.   To prevent or reduce lower extremity swelling: Eat a low salt diet. Salt makes the body hold onto extra fluid which causes swelling. Sit with legs elevated. For example, in the recliner or on an ottoman.  Wear knee-high compression stockings during the daytime. Ones labeled 15-20 mmHg provide good compression.

## 2024-06-17 NOTE — Progress Notes (Signed)
 Cardiology Office Note   Date:  06/20/2024  ID:  David Weaver, David Weaver [birth year], MRN [id number] PCP: Claudene Rayfield HERO, MD  [facility] Providers Cardiologist:  David Scarce, MD Cardiology APP:  David Weaver RAMAN, NP     History of Present Illness David Weaver is a 73 y.o. male  with a hx of CAD s/p LAD PCI, PAF, HTN, mild carotid stenosis, lupus.   He was seen in clinic September 2016 for presurgical assessment and granted clearance.  During shoulder surgery he went into atrial fibrillation with RVR.  Returned to NSR without intervention.  He was started on Eliquis  and referred to cardiology.  Subsequent 30-day monitor with recurrent atrial fibrillation and anticoagulation was continued.  Exercise Myoview  March 2019 LVEF 46% with global hypokinesis and no ischemia.  He achieved 10.5 METS on Bruce protocol.  He was referred for echo with LVEF 55 to 60%, grade 1 diastolic dysfunction.  He had carotid Dopplers 2020 revealing 1-39% ICA stenosis bilaterally.  He had coronary CTA with significant obstruction in the mid LAD with left heart cath revealing 30% ostial LAD and 80% mid LAD stenosis successfully stented 03/2020.  Carotid duplex 04/01/2021 with bilateral 1-39% stenosis.    Echo which was performed 09/19/21 and showed LVEF 55%, grade 1 diastolic dysfunction, normal RVSF, trivial AI. His shortness of breath was attributed to deconditioning and exercise recommended.  Losartan  increased to 100 mg daily for BP control.  Underwent left shoulder arthroplasty 05/2022.  Admitted to also 2022 with perforated diverticulitis treated conservatively.   Last seen 11/08/2022 by Dr. Scarce.  His BP was well-controlled.  Reported no angina, maintaining sinus rhythm and given clearance to hold Xarelto  prior to colonoscopy.  Zetia  added as LDL not at goal of less than 70.   At PCP visit 03/2023 amlodipine  held due to hypotension. It was later resumed as BP elevated.    Seen 06/2023. Echocardiogram ordered  for 81-month history of exertional dyspnea, fatigue.  Echo 08/2023 low normal LVEF 50 to 55%, calcification of heart valves but no significant stenosis nor regurgitation.  It was overall unchanged from prior.  Last seen 11/09/23 with no recent PAF, BP at goal, and LDL well controlled at 33.    Presents today for follow up. 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  Notes recently had a lupus flare with rash which is finally started to clear up. Reports no shortness of breath nor dyspnea on exertion. Reports no chest pain, pressure, or tightness. No  orthopnea, PND. Reports no palpitations.  He does note he is bothered by dry mouth. Interested in being more active, discussed seated exercises. Some mild LE edema by end of day, predominantly sitting with legs in dependent position.   ROS: Please see the history of present illness.    All other systems reviewed and are negative.   Studies Reviewed EKG Interpretation Date/Time:  Friday June 17 2024 11:02:27 EDT Ventricular Rate:  65 PR Interval:  136 QRS Duration:  88 QT Interval:  400 QTC Calculation: 416 R Axis:   40  Text Interpretation: Normal sinus rhythm  No acute ST/T wave changes Confirmed by David Weaver ([id number]) on 06/17/2024 11:03:43 AM    Cardiac Studies & Procedures   ______________________________________________________________________________________________ CARDIAC CATHETERIZATION  CARDIAC CATHETERIZATION 04/06/2020  Conclusion  The left ventricular systolic function is normal.  LV end diastolic pressure is normal.  The left ventricular ejection fraction is 55-65%  by visual estimate.  Mid LAD lesion is 80% stenosed.  Post intervention, there is a 0% residual stenosis.  A drug-eluting stent was successfully placed using a STENT RESOLUTE ONYX 3.0X34.  Ost LAD lesion is 30% stenosed.  1.  Severe one-vessel coronary artery disease involving mid LAD with 80% moderately calcified  eccentric stenosis.  No other obstructive disease. 2.  Normal LV systolic function and left ventricular end-diastolic pressure. 3.  Successful angioplasty and drug-eluting stent placement to the mid LAD.  PCI indication is class III angina on maximal medical therapy and intermediate risk noninvasive testing.  Recommendations: Xarelto  can be resumed tomorrow. Given that the patient is long-term anticoagulation with Xarelto , recommend clopidogrel  monotherapy without aspirin  to minimize risk of bleeding. I increased atorvastatin  to 40 mg daily. The patient is a candidate for same-day discharge.  Findings Coronary Findings Diagnostic  Dominance: Right  Left Main Vessel is angiographically normal.  Left Anterior Descending Ost LAD lesion is 30% stenosed. Mid LAD lesion is 80% stenosed. The lesion is type C. The lesion is moderately calcified.  Left Circumflex Vessel is small. Vessel is angiographically normal.  First Obtuse Marginal Branch Vessel is angiographically normal.  Second Obtuse Marginal Branch Vessel is small in size. Vessel is angiographically normal.  Third Obtuse Marginal Branch Vessel is small in size. Vessel is angiographically normal.  Right Coronary Artery The vessel exhibits minimal luminal irregularities.  Right Posterior Descending Artery The vessel exhibits minimal luminal irregularities.  Right Posterior Atrioventricular Artery Vessel is angiographically normal.  Intervention  Mid LAD lesion Stent Lesion length:  30 mm. CATH VISTA GUIDE 6FR XBLAD3.5 guide catheter was inserted. Lesion crossed with guidewire using a WIRE RUNTHROUGH .[id number]. Pre-stent angioplasty was performed using a BALLOON SAPPHIRE 2.5X15. Maximum pressure:  10 atm. Inflation time:  30 sec. A drug-eluting stent was successfully placed using a STENT RESOLUTE ONYX 3.0X34. Maximum pressure: 12 atm. Inflation time: 20 sec. Post-stent angioplasty was performed using a BALLOON SAPPHIRE [place]  3.25X18. Maximum pressure:  20 atm. Inflation time:  30 sec. Noncompliant balloon was inflated to 12 atm distally and a maximum of 20 atm in the mid segment Post-Intervention Lesion Assessment The intervention was successful. Pre-interventional TIMI flow is 3. Post-intervention TIMI flow is 3. No complications occurred at this lesion. There is a 0% residual stenosis post intervention.   STRESS TESTS  MYOCARDIAL PERFUSION IMAGING 02/10/2018  Interpretation Summary  The left ventricular ejection fraction is mildly decreased (45-54%).  Nuclear stress EF: 46%. Mild generalized hypokinesis  There was no ST segment deviation noted during stress.  The study is normal.  This is a low risk study. No ischemia. Prior ECHO 2016 - normal EF.  David Parchment, MD   ECHOCARDIOGRAM  ECHOCARDIOGRAM COMPLETE 08/11/2023  Narrative ECHOCARDIOGRAM REPORT    Patient Name:   David Weaver Date of Exam: 08/11/2023 Medical Rec #:  [id number]     Height:       68.0 in Accession #:    [id number]    Weight:       191.0 lb Date of Birth:  06/01/1951     BSA:          2.004 m Patient Age:    72 years      BP:           116/78 mmHg Patient Gender: M             HR:           64 bpm. Exam Location:  Outpatient  Procedure: 2D Echo, 3D Echo, Cardiac Doppler, Color Doppler and Strain Analysis  Indications:    Dyspnea  History:        Patient has prior history of Echocardiogram examinations, most recent 09/19/2021. CAD, COPD; Risk Factors:Diabetes and Former Smoker.  Sonographer:    Orvil Holmes RDCS Referring Phys: [id number] Tamsin Nader S Robertine Kipper  IMPRESSIONS   1. Left ventricular ejection fraction, by estimation, is 50 to 55%. The left ventricle has low normal function. The left ventricle has no regional wall motion abnormalities. Left ventricular diastolic parameters were normal. 2. Right ventricular systolic function is normal. The right ventricular size is normal. There is normal pulmonary artery  systolic pressure. 3. The mitral valve is normal in structure. No evidence of mitral valve regurgitation. No evidence of mitral stenosis. Moderate mitral annular calcification. 4. The aortic valve is calcified. There is moderate calcification of the aortic valve. Aortic valve regurgitation is trivial. 5. The inferior vena cava is normal in size with greater than 50% respiratory variability, suggesting right atrial pressure of 3 mmHg.  Comparison(s): No significant change from prior study.  FINDINGS Left Ventricle: Left ventricular ejection fraction, by estimation, is 50 to 55%. The left ventricle has low normal function. The left ventricle has no regional wall motion abnormalities. Global longitudinal strain performed but not reported based on interpreter judgement due to suboptimal tracking. The left ventricular internal cavity size was normal in size. There is no left ventricular hypertrophy. Left ventricular diastolic parameters were normal.  Right Ventricle: The right ventricular size is normal. Right vetricular wall thickness was not well visualized. Right ventricular systolic function is normal. There is normal pulmonary artery systolic pressure. The tricuspid regurgitant velocity is 1.68 m/s, and with an assumed right atrial pressure of 3 mmHg, the estimated right ventricular systolic pressure is 14.3 mmHg.  Left Atrium: Left atrial size was normal in size.  Right Atrium: Right atrial size was normal in size.  Pericardium: There is no evidence of pericardial effusion.  Mitral Valve: The mitral valve is normal in structure. Moderate mitral annular calcification. No evidence of mitral valve regurgitation. No evidence of mitral valve stenosis.  Tricuspid Valve: The tricuspid valve is normal in structure. Tricuspid valve regurgitation is trivial. No evidence of tricuspid stenosis.  Aortic Valve: The aortic valve is calcified. There is moderate calcification of the aortic valve. Aortic  valve regurgitation is trivial. Aortic regurgitation PHT measures 774 msec.  Pulmonic Valve: The pulmonic valve was not well visualized. Pulmonic valve regurgitation is trivial. No evidence of pulmonic stenosis.  Aorta: The aortic root, ascending aorta and aortic arch are all structurally normal, with no evidence of dilitation or obstruction.  Venous: The inferior vena cava is normal in size with greater than 50% respiratory variability, suggesting right atrial pressure of 3 mmHg.  IAS/Shunts: The atrial septum is grossly normal.   LEFT VENTRICLE PLAX 2D LVIDd:         3.73 cm   Diastology LVIDs:         1.90 cm   LV e' medial:    6.31 cm/s LV PW:         1.07 cm   LV E/e' medial:  6.4 LV IVS:        1.11 cm   LV e' lateral:   8.38 cm/s LVOT diam:     2.10 cm   LV E/e' lateral: 4.8 LV SV:         78 LV SV Index:   39 LVOT  Area:     3.46 cm   RIGHT VENTRICLE RV Basal diam:  3.68 cm RV Mid diam:    2.89 cm RV S prime:     11.50 cm/s TAPSE (M-mode): 2.1 cm  LEFT ATRIUM             Index        RIGHT ATRIUM           Index LA diam:        5.20 cm 2.59 cm/m   RA Area:     16.70 cm LA Vol (A2C):   40.5 ml 20.21 ml/m  RA Volume:   47.90 ml  23.90 ml/m LA Vol (A4C):   40.0 ml 19.96 ml/m LA Biplane Vol: 43.4 ml 21.66 ml/m AORTIC VALVE LVOT Vmax:   99.60 cm/s LVOT Vmean:  61.300 cm/s LVOT VTI:    0.224 m AI PHT:      774 msec  AORTA Ao Root diam: 3.80 cm Ao Asc diam:  3.75 cm  MITRAL VALVE               TRICUSPID VALVE MV Area (PHT): 2.56 cm    TR Peak grad:   11.3 mmHg MV Decel Time: 296 msec    TR Vmax:        168.00 cm/s MV E velocity: 40.40 cm/s MV A velocity: 79.10 cm/s  SHUNTS MV E/A ratio:  0.51        Systemic VTI:  0.22 m Systemic Diam: 2.10 cm  David Bruckner MD Electronically signed by David Bruckner MD Signature Date/Time: 08/11/2023/4:50:46 PM    Final    MONITORS  CARDIAC EVENT MONITOR 09/04/2015  Narrative Images from the  original result were not included.    30 Day Event Monitor  Quality: Fair.  Baseline artifact.  Sinus rhythm, sinus tachycardia and atrial fibrillation with rates in the 60s-70s were noted. No pauses or other arrhythmias were noted.   David C. Raford, MD 10/12/2015 8:47 AM   CT SCANS  CT CORONARY FRACTIONAL FLOW RESERVE DATA PREP 03/30/2020  Narrative EXAM: FFRCT ANALYSIS  CLINICAL DATA:  74M with abnormal coronary CT-A.  FINDINGS: FFRct analysis was performed on the original cardiac CT angiogram dataset. Diagrammatic representation of the FFRct analysis is provided in a separate PDF document in PACS. This dictation was created using the PDF document and an interactive 3D model of the results. 3D model is not available in the EMR/PACS. Normal FFR range is >0.80.  1. Left Main:  No significant stenosis.  FFRct 0.98  2. LAD: Significant stenosis at the level of D3. FFRct 0.96 proximal, 0.79 mid, 0.62 after D3. 3. LCX: No significant stenosis.  FFRct 0.92.  OM2 FFRct 0.96 4. RCA: No significant stenosis. FFRct 0.95 proximal, 0.93 mid, 0.85 distal.  IMPRESSION: 1. CT FFR analysis showed significant stenosis in the mid LAD at the level of D3.  2.  No other significant stenoses were identified.  Note: These examples are not recommendations of [employer] and only provided as examples of what other customers are doing.   Electronically Signed By: David Weaver On: 03/31/2020 11:54   CT SCANS  CT CORONARY MORPH W/CTA COR W/SCORE 03/29/2020  Addendum 03/29/2020 10:56 PM ADDENDUM REPORT: 03/29/2020 22:53  CLINICAL DATA:  74M with exertional shortness of breath and arm discomfort.  EXAM: Cardiac/Coronary  CT  TECHNIQUE: The patient was scanned on a [employer].  FINDINGS: A 120 kV prospective scan was triggered in the descending thoracic  aorta at 111 HU's. Axial non-contrast 3 mm slices were carried out through the heart. The data set was  analyzed on a dedicated work station and scored using the Agatson method. Gantry rotation speed was 250 msecs and collimation was .6 mm. No beta blockade and 0.8 mg of sl NTG was given. The 3D data set was reconstructed in 5% intervals of the 67-82 % of the R-R cycle. Diastolic phases were analyzed on a dedicated work station using MPR, MIP and VRT modes. The patient received 80 cc of contrast.  Aorta: Mildly dilated. 3.7 mm. Mild calcification in the aortic root and descending aorta. No dissection.  Aortic Valve: Trileaflet.  No calcification.  Coronary Arteries:  Normal coronary origin.  Right dominance.  RCA is a large dominant artery that gives rise to PDA and 2 PLV branches. There is minimum (<25%) calcified plaque proximally and moderate (50-69%) mixed plaque in the distal RCA. There .  Left main is a large artery that gives rise to LAD and LCX arteries.  LAD is a large vessel that has diffuse, scattered plaque. There is mild (25-49%) mostly calcified plaque proximally. There is a focal area of moderate (50-69%) mixed plaque in the mid LAD at the level of D3. There is no plaque is small D1 or D2. D3 is a small vessel with calcified plaque at the ostium. There are also small D4 and D5 vessels without significant plaque.  LCX is a non-dominant artery that gives rise to one large OM1 branch. There is no plaque.  Other findings:  Normal pulmonary vein drainage into the left atrium.  Normal let atrial appendage without a thrombus.  Normal size of the pulmonary artery.  IMPRESSION: 1. Coronary calcium  score of 522. This was 72nd percentile for age and sex matched control.  2. Normal coronary origin with right dominance.  3. There is moderate (CAD RADS3) plaque in the mid LAD and distal RCA.  4. There is concern that the mid LAD lesion could be obstructive. Will send study for FFRct.  David Scarce, MD   Electronically Signed By: David Weaver On:  03/29/2020 22:53  Narrative EXAM: OVER-READ INTERPRETATION  CT CHEST  The following report is an over-read performed by radiologist Dr. Toribio Weaver of [facility] on 03/29/2020. This over-read does not include interpretation of cardiac or coronary anatomy or pathology. The coronary calcium  score/coronary CTA interpretation by the cardiologist is attached.  COMPARISON:  None.  FINDINGS: Aortic atherosclerosis. Areas of cylindrical bronchiectasis in the medial aspect of the right lower lobe and inferior segment of the lingula. Within the visualized portions of the thorax there are no suspicious appearing pulmonary nodules or masses, there is no acute consolidative airspace disease, no pleural effusions, no pneumothorax and no lymphadenopathy. Visualized portions of the upper abdomen are unremarkable. There are no aggressive appearing lytic or blastic lesions noted in the visualized portions of the skeleton.  IMPRESSION: 1.  Aortic Atherosclerosis (ICD10-I70.0). 2. Areas of cylindrical bronchiectasis in the medial aspect of the right lower lobe and inferior segment of the lingula, likely sequela of prior infections.  Electronically Signed: By: David Weaver M.D. On: 03/29/2020 14:59     ______________________________________________________________________________________________      Risk Assessment/Calculations  CHA2DS2-VASc Score = 3   This indicates a 3.2% annual risk of stroke. The patient's score is based upon: CHF History: 0 HTN History: 1 Diabetes History: 0 Stroke History: 0 Vascular Disease History: 1 Age Score: 1 Gender Score: 0  Physical Exam VS:  BP 122/74 (BP Location: Left Arm, Patient Position: Sitting, Cuff Size: Normal)   Pulse 77   Ht 5' 4 (1.626 m)   Wt 190 lb (86.2 kg)   SpO2 98%   BMI 32.61 kg/m        Wt Readings from Last 3 Encounters:  06/17/24 190 lb (86.2 kg)  11/09/23 190 lb 12.8 oz (86.5 kg)   09/07/23 195 lb 12.8 oz (88.8 kg)    GEN: Well nourished, overweight, well developed in no acute distress NECK: No JVD; No carotid bruits CARDIAC: RRR, no murmurs, rubs, gallops RESPIRATORY:  Clear to auscultation without rales, wheezing or rhonchi  ABDOMEN: Soft, non-tender, non-distended EXTREMITIES:  No edema; No deformity   ASSESSMENT AND PLAN  CAD s/p PCI to LAD 03/2020 / HLD, LDL goal <70-  Stable with no anginal symptoms. No indication for ischemic evaluation.   GDMT atorvastatin  40 mg daily, amlodipine  5 mg daily, Lopressor  50 mg twice daily.  No aspirin  due to chronic anticoagulation. Heart healthy diet and regular cardiovascular exercise encouraged.    Dry mouth - recommend Biotene dry mouth rinse.    Carotid stenosis - Mild 04/2019. Stable by duplex 03/2021. Continue Xarelto  and Atorvastatin  40mg  daily.    Obesity - Weight loss via diet and exercise encouraged. Discussed the impact being overweight would have on cardiovascular risk. Exercise limited by orthopedic issues. Encouraged seated exercises.    HTN - BP well controlled. Continue current antihypertensive regimen Amlodipine  5mg  daily, Losartan  100mg  daily, Lopressor  50mg  BID.    PAF / Chronic anticoagulation - NSR by EKG today. Reports no palpitations. CHA2DS2-VASc Score = 3 [CHF History: 0, HTN History: 1, Diabetes History: 0, Stroke History: 0, Vascular Disease History: 1, Age Score: 1, Gender Score: 0].  Therefore, the patient's annual risk of stroke is 3.2 %.    Continue Lopressor  50 mg twice daily, Xarelto  20 mg daily.  Denies bleeding complications.  LE edema - likely venous insufficiency. Low sodium diet, leg elevation encouraged.       Dispo: follow up in 6 months with Dr. Raford  Signed, Weaver GORMAN Finder, NP

## 2024-06-28 ENCOUNTER — Observation Stay (HOSPITAL_COMMUNITY)
Admission: EM | Admit: 2024-06-28 | Discharge: 2024-06-30 | Disposition: A | Discharge status: Home Health | Attending: Infectious Diseases | Admitting: Infectious Diseases

## 2024-06-28 ENCOUNTER — Other Ambulatory Visit: Payer: Self-pay

## 2024-06-28 ENCOUNTER — Emergency Department (HOSPITAL_COMMUNITY)

## 2024-06-28 ENCOUNTER — Encounter (HOSPITAL_COMMUNITY): Payer: Self-pay

## 2024-06-28 DIAGNOSIS — Z79899 Other long term (current) drug therapy: Secondary | ICD-10-CM | POA: Diagnosis not present

## 2024-06-28 DIAGNOSIS — I10 Essential (primary) hypertension: Secondary | ICD-10-CM | POA: Insufficient documentation

## 2024-06-28 DIAGNOSIS — H9190 Unspecified hearing loss, unspecified ear: Secondary | ICD-10-CM | POA: Diagnosis not present

## 2024-06-28 DIAGNOSIS — R4182 Altered mental status, unspecified: Secondary | ICD-10-CM | POA: Diagnosis present

## 2024-06-28 DIAGNOSIS — A419 Sepsis, unspecified organism: Secondary | ICD-10-CM | POA: Diagnosis not present

## 2024-06-28 DIAGNOSIS — Z9861 Coronary angioplasty status: Secondary | ICD-10-CM | POA: Insufficient documentation

## 2024-06-28 DIAGNOSIS — M329 Systemic lupus erythematosus, unspecified: Secondary | ICD-10-CM | POA: Insufficient documentation

## 2024-06-28 DIAGNOSIS — Z7901 Long term (current) use of anticoagulants: Secondary | ICD-10-CM | POA: Diagnosis not present

## 2024-06-28 DIAGNOSIS — E1169 Type 2 diabetes mellitus with other specified complication: Secondary | ICD-10-CM | POA: Diagnosis not present

## 2024-06-28 DIAGNOSIS — Z96653 Presence of artificial knee joint, bilateral: Secondary | ICD-10-CM | POA: Diagnosis not present

## 2024-06-28 DIAGNOSIS — R059 Cough, unspecified: Secondary | ICD-10-CM | POA: Insufficient documentation

## 2024-06-28 DIAGNOSIS — N390 Urinary tract infection, site not specified: Secondary | ICD-10-CM | POA: Diagnosis not present

## 2024-06-28 DIAGNOSIS — R4701 Aphasia: Secondary | ICD-10-CM | POA: Insufficient documentation

## 2024-06-28 DIAGNOSIS — G4700 Insomnia, unspecified: Secondary | ICD-10-CM | POA: Insufficient documentation

## 2024-06-28 DIAGNOSIS — F11288 Opioid dependence with other opioid-induced disorder: Secondary | ICD-10-CM | POA: Insufficient documentation

## 2024-06-28 DIAGNOSIS — R569 Unspecified convulsions: Secondary | ICD-10-CM

## 2024-06-28 DIAGNOSIS — I6782 Cerebral ischemia: Secondary | ICD-10-CM | POA: Diagnosis not present

## 2024-06-28 DIAGNOSIS — Z87891 Personal history of nicotine dependence: Secondary | ICD-10-CM | POA: Insufficient documentation

## 2024-06-28 DIAGNOSIS — N3000 Acute cystitis without hematuria: Secondary | ICD-10-CM | POA: Diagnosis not present

## 2024-06-28 DIAGNOSIS — B9620 Unspecified Escherichia coli [E. coli] as the cause of diseases classified elsewhere: Secondary | ICD-10-CM | POA: Insufficient documentation

## 2024-06-28 DIAGNOSIS — G894 Chronic pain syndrome: Secondary | ICD-10-CM | POA: Diagnosis present

## 2024-06-28 DIAGNOSIS — I480 Paroxysmal atrial fibrillation: Secondary | ICD-10-CM | POA: Diagnosis not present

## 2024-06-28 DIAGNOSIS — F32A Depression, unspecified: Secondary | ICD-10-CM | POA: Diagnosis not present

## 2024-06-28 DIAGNOSIS — G9340 Encephalopathy, unspecified: Secondary | ICD-10-CM | POA: Diagnosis not present

## 2024-06-28 DIAGNOSIS — E785 Hyperlipidemia, unspecified: Secondary | ICD-10-CM | POA: Diagnosis not present

## 2024-06-28 DIAGNOSIS — Z7984 Long term (current) use of oral hypoglycemic drugs: Secondary | ICD-10-CM | POA: Insufficient documentation

## 2024-06-28 DIAGNOSIS — I2510 Atherosclerotic heart disease of native coronary artery without angina pectoris: Secondary | ICD-10-CM | POA: Insufficient documentation

## 2024-06-28 DIAGNOSIS — I700 Atherosclerosis of aorta: Secondary | ICD-10-CM | POA: Diagnosis not present

## 2024-06-28 HISTORY — DX: Type 2 diabetes mellitus without complications: E11.9

## 2024-06-28 LAB — URINALYSIS, ROUTINE W REFLEX MICROSCOPIC
Bilirubin Urine: NEGATIVE
Glucose, UA: NEGATIVE mg/dL
Hgb urine dipstick: NEGATIVE
Ketones, ur: NEGATIVE mg/dL
Nitrite: POSITIVE — AB
Protein, ur: NEGATIVE mg/dL
Specific Gravity, Urine: 1.034 — ABNORMAL HIGH (ref 1.005–1.030)
pH: 8 (ref 5.0–8.0)

## 2024-06-28 LAB — TROPONIN I (HIGH SENSITIVITY): Troponin I (High Sensitivity): 9 ng/L (ref <18)

## 2024-06-28 LAB — COMPREHENSIVE METABOLIC PANEL WITH GFR
ALT: 23 U/L (ref 0–44)
AST: 19 U/L (ref 15–41)
Albumin: 3.5 g/dL (ref 3.5–5.0)
Alkaline Phosphatase: 45 U/L (ref 38–126)
Anion gap: 13 (ref 5–15)
BUN: 14 mg/dL (ref 8–23)
CO2: 21 mmol/L — ABNORMAL LOW (ref 22–32)
Calcium: 8.4 mg/dL — ABNORMAL LOW (ref 8.9–10.3)
Chloride: 103 mmol/L (ref 98–111)
Creatinine, Ser: 0.75 mg/dL (ref 0.61–1.24)
GFR, Estimated: >60 mL/min (ref >60)
Glucose, Bld: 145 mg/dL — ABNORMAL HIGH (ref 70–99)
Potassium: 3.7 mmol/L (ref 3.5–5.1)
Sodium: 137 mmol/L (ref 135–145)
Total Bilirubin: 0.7 mg/dL (ref 0.0–1.2)
Total Protein: 5.9 g/dL — ABNORMAL LOW (ref 6.5–8.1)

## 2024-06-28 LAB — ETHANOL: Alcohol, Ethyl (B): <15 mg/dL (ref <15)

## 2024-06-28 LAB — POCT I-STAT, CHEM 8
BUN: 14 mg/dL (ref 8–23)
Calcium, Ion: 1.1 mmol/L — ABNORMAL LOW (ref 1.15–1.40)
Chloride: 102 mmol/L (ref 98–111)
Creatinine, Ser: 0.7 mg/dL (ref 0.61–1.24)
Glucose, Bld: 148 mg/dL — ABNORMAL HIGH (ref 70–99)
HCT: 38 % — ABNORMAL LOW (ref 39.0–52.0)
Hemoglobin: 12.9 g/dL — ABNORMAL LOW (ref 13.0–17.0)
Potassium: 3.7 mmol/L (ref 3.5–5.1)
Sodium: 136 mmol/L (ref 135–145)
TCO2: 22 mmol/L (ref 22–32)

## 2024-06-28 LAB — CBC WITH DIFFERENTIAL/PLATELET
Abs Immature Granulocytes: 0.08 K/uL — ABNORMAL HIGH (ref 0.00–0.07)
Basophils Absolute: 0 K/uL (ref 0.0–0.1)
Basophils Relative: 0 %
Eosinophils Absolute: 0.1 K/uL (ref 0.0–0.5)
Eosinophils Relative: 1 %
HCT: 38.8 % — ABNORMAL LOW (ref 39.0–52.0)
Hemoglobin: 12.4 g/dL — ABNORMAL LOW (ref 13.0–17.0)
Immature Granulocytes: 1 %
Lymphocytes Relative: 7 %
Lymphs Abs: 1 K/uL (ref 0.7–4.0)
MCH: 27.9 pg (ref 26.0–34.0)
MCHC: 32 g/dL (ref 30.0–36.0)
MCV: 87.2 fL (ref 80.0–100.0)
Monocytes Absolute: 1 K/uL (ref 0.1–1.0)
Monocytes Relative: 7 %
Neutro Abs: 11.4 K/uL — ABNORMAL HIGH (ref 1.7–7.7)
Neutrophils Relative %: 84 %
Platelets: 170 K/uL (ref 150–400)
RBC: 4.45 MIL/uL (ref 4.22–5.81)
RDW: 15.6 % — ABNORMAL HIGH (ref 11.5–15.5)
WBC: 13.6 K/uL — ABNORMAL HIGH (ref 4.0–10.5)
nRBC: 0 % (ref 0.0–0.2)

## 2024-06-28 LAB — I-STAT CG4 LACTIC ACID, ED
Lactic Acid, Venous: 2.2 mmol/L (ref 0.5–1.9)
Lactic Acid, Venous: 1.4 mmol/L (ref 0.5–1.9)

## 2024-06-28 LAB — PROTIME-INR
INR: 1.7 — ABNORMAL HIGH (ref 0.8–1.2)
Prothrombin Time: 20.5 s — ABNORMAL HIGH (ref 11.4–15.2)

## 2024-06-28 LAB — GLUCOSE, CAPILLARY
Glucose-Capillary: 128 mg/dL — ABNORMAL HIGH (ref 70–99)
Glucose-Capillary: 227 mg/dL — ABNORMAL HIGH (ref 70–99)

## 2024-06-28 LAB — CBG MONITORING, ED
Glucose-Capillary: 149 mg/dL — ABNORMAL HIGH (ref 70–99)
Glucose-Capillary: 142 mg/dL — ABNORMAL HIGH (ref 70–99)

## 2024-06-28 LAB — RESP PANEL BY RT-PCR (RSV, FLU A&B, COVID)  RVPGX2
Influenza A by PCR: NEGATIVE
Influenza B by PCR: NEGATIVE
Resp Syncytial Virus by PCR: NEGATIVE
SARS Coronavirus 2 by RT PCR: NEGATIVE

## 2024-06-28 LAB — APTT: aPTT: 30 s (ref 24–36)

## 2024-06-28 MED ORDER — ACETAMINOPHEN 500 MG PO TABS
1000.0000 mg | ORAL_TABLET | Freq: Once | ORAL | Status: AC
  Administered 2024-06-28: 1000 mg via ORAL
  Filled 2024-06-28: qty 2

## 2024-06-28 MED ORDER — COLESTIPOL HCL 1 G PO TABS
1.0000 g | ORAL_TABLET | Freq: Two times a day (BID) | ORAL | Status: DC
  Administered 2024-06-29 – 2024-06-30 (×3): 1 g via ORAL
  Filled 2024-06-28 (×5): qty 1

## 2024-06-28 MED ORDER — FAMOTIDINE 20 MG PO TABS
20.0000 mg | ORAL_TABLET | Freq: Every day | ORAL | Status: DC
  Administered 2024-06-28 – 2024-06-30 (×3): 20 mg via ORAL
  Filled 2024-06-28 (×3): qty 1

## 2024-06-28 MED ORDER — ACETAMINOPHEN 325 MG PO TABS
650.0000 mg | ORAL_TABLET | Freq: Four times a day (QID) | ORAL | Status: DC | PRN
  Administered 2024-06-29 – 2024-06-30 (×2): 650 mg via ORAL
  Filled 2024-06-28 (×3): qty 2

## 2024-06-28 MED ORDER — LACTATED RINGERS IV SOLN: INTRAVENOUS | Status: DC

## 2024-06-28 MED ORDER — ATORVASTATIN CALCIUM 40 MG PO TABS
40.0000 mg | ORAL_TABLET | Freq: Every day | ORAL | Status: DC
  Administered 2024-06-28 – 2024-06-30 (×3): 40 mg via ORAL
  Filled 2024-06-28 (×3): qty 1

## 2024-06-28 MED ORDER — OXYCODONE-ACETAMINOPHEN 7.5-325 MG PO TABS
1.0000 | ORAL_TABLET | Freq: Four times a day (QID) | ORAL | Status: DC | PRN
  Administered 2024-06-28 – 2024-06-29 (×4): 1 via ORAL
  Filled 2024-06-28 (×4): qty 1

## 2024-06-28 MED ORDER — RIVAROXABAN 20 MG PO TABS
20.0000 mg | ORAL_TABLET | Freq: Every day | ORAL | Status: DC
  Administered 2024-06-28 – 2024-06-29 (×2): 20 mg via ORAL
  Filled 2024-06-28 (×2): qty 1

## 2024-06-28 MED ORDER — PANTOPRAZOLE SODIUM 40 MG PO TBEC
40.0000 mg | DELAYED_RELEASE_TABLET | Freq: Every day | ORAL | Status: DC
Start: 2024-06-28 — End: 2024-06-30
  Administered 2024-06-28 – 2024-06-30 (×3): 40 mg via ORAL
  Filled 2024-06-28 (×3): qty 1

## 2024-06-28 MED ORDER — ACETAMINOPHEN 650 MG RE SUPP: 650.0000 mg | Freq: Once | RECTAL | Status: DC

## 2024-06-28 MED ORDER — SENNOSIDES-DOCUSATE SODIUM 8.6-50 MG PO TABS: 1.0000 | ORAL_TABLET | Freq: Every evening | ORAL | Status: DC | PRN

## 2024-06-28 MED ORDER — METOPROLOL TARTRATE 50 MG PO TABS
50.0000 mg | ORAL_TABLET | Freq: Two times a day (BID) | ORAL | Status: DC
  Administered 2024-06-28 – 2024-06-30 (×5): 50 mg via ORAL
  Filled 2024-06-28: qty 2
  Filled 2024-06-28 (×4): qty 1

## 2024-06-28 MED ORDER — BUPROPION HCL ER (XL) 150 MG PO TB24
150.0000 mg | ORAL_TABLET | Freq: Every day | ORAL | Status: DC
  Administered 2024-06-28 – 2024-06-30 (×3): 150 mg via ORAL
  Filled 2024-06-28 (×3): qty 1

## 2024-06-28 MED ORDER — MIRTAZAPINE 15 MG PO TABS
30.0000 mg | ORAL_TABLET | Freq: Every day | ORAL | Status: DC
Start: 2024-06-28 — End: 2024-06-30
  Administered 2024-06-28 – 2024-06-29 (×2): 30 mg via ORAL
  Filled 2024-06-28 (×2): qty 2

## 2024-06-28 MED ORDER — SODIUM CHLORIDE 0.9 % IV SOLN
2.0000 g | Freq: Once | INTRAVENOUS | Status: AC
  Administered 2024-06-28: 2 g via INTRAVENOUS
  Filled 2024-06-28: qty 20

## 2024-06-28 MED ORDER — IOHEXOL 350 MG/ML SOLN
100.0000 mL | Freq: Once | INTRAVENOUS | Status: AC | PRN
  Administered 2024-06-28: 100 mL via INTRAVENOUS

## 2024-06-28 MED ORDER — SODIUM CHLORIDE 0.9 % IV SOLN: 1.0000 g | INTRAVENOUS | Status: DC

## 2024-06-28 MED ORDER — ACETAMINOPHEN 650 MG RE SUPP: 650.0000 mg | Freq: Four times a day (QID) | RECTAL | Status: DC | PRN

## 2024-06-28 MED ORDER — SODIUM CHLORIDE 0.9% FLUSH
3.0000 mL | Freq: Once | INTRAVENOUS | Status: AC
  Administered 2024-06-28: 3 mL via INTRAVENOUS

## 2024-06-28 MED ORDER — SODIUM CHLORIDE 0.9 % IV SOLN
2.0000 g | INTRAVENOUS | Status: DC
  Administered 2024-06-29 – 2024-06-30 (×2): 2 g via INTRAVENOUS
  Filled 2024-06-28 (×2): qty 20

## 2024-06-28 MED ORDER — HYDROXYCHLOROQUINE SULFATE 200 MG PO TABS
200.0000 mg | ORAL_TABLET | Freq: Two times a day (BID) | ORAL | Status: DC
Start: 2024-06-28 — End: 2024-06-30
  Administered 2024-06-28 – 2024-06-30 (×4): 200 mg via ORAL
  Filled 2024-06-28 (×9): qty 1

## 2024-06-28 MED ORDER — EZETIMIBE 10 MG PO TABS
10.0000 mg | ORAL_TABLET | Freq: Every day | ORAL | Status: DC
  Administered 2024-06-29 – 2024-06-30 (×2): 10 mg via ORAL
  Filled 2024-06-28 (×2): qty 1

## 2024-06-28 MED ORDER — SODIUM CHLORIDE 0.9 % IV BOLUS
1000.0000 mL | Freq: Once | INTRAVENOUS | Status: AC
  Administered 2024-06-28: 1000 mL via INTRAVENOUS

## 2024-06-28 MED ORDER — SODIUM CHLORIDE 0.9 % IV SOLN
500.0000 mg | Freq: Once | INTRAVENOUS | Status: AC
  Administered 2024-06-28: 500 mg via INTRAVENOUS
  Filled 2024-06-28: qty 5

## 2024-06-28 NOTE — ED Provider Notes (Signed)
 Patient handed off to me awaiting admission for sepsis/UTI.  Came in as a code stroke for altered mental status and right-sided weakness.  Has been cleared by neurology.  Sounds like he was found to be febrile 100.4.  Mildly tachycardic.  Looks like he has a urinary tract infection.  Question pneumonia as well.  White count of 13.  Hemodynamically stable.  Will admit to hospitalist for further care.  This chart was dictated using voice recognition software.  Despite best efforts to proofread,  errors can occur which can change the documentation meaning.    Ruthe Cornet, DO 06/28/24 [phone]

## 2024-06-28 NOTE — ED Triage Notes (Signed)
 Patient BIBA from home. Wife called 911 because patient was disoriented and was found standing in front of the bathroom sink. Patient was found confused and had urinated on himself. When EMS arrived patient had complaints of right arm and leg weakness, right side facial droop and aphasia. LKW was 1300 06/27/2024  Vitals: BP: 120/70 HR: 95-100 EKG: NSR BGL: 148 18 G LAC

## 2024-06-28 NOTE — Hospital Course (Addendum)
 David Weaver is a 73 y.o. person living with a history of A-fib on Xarelto , hypertension, cutaneous lupus, T2DM, DDD on chronic opioid use, depression who presented with AMS and admitted for acute encephalopathy c/f sepsis in setting of UTI on hospital day 0   #Acute encephalopathy  #Sepsis rule out  #UTI Presents with AMS overnight.  ANO x 2 to self and place.  Last well-known was about 9:30 PM 7/28 per wife after dinner and going to bed.  No acute concerns on 7/28 and was feeling well at the time of him going to bed. Patient had reported dysuria. Will treat UTI if this may be contributing to his AMS. EEG ordered given concern of seizure based on wife's report of him standing at sink overnight confused. Neurology still following but less suspicious of TIA/CVA at this time. Electrolytes stable, normal renal and hepatic function. Noted leukocytosis of 13.5 and reported temp of 100.4. UA suggestive of UTI given associated dysuria. CXR question PNA but no acute respiratory symptoms, s/p abx in ED already. No recent changes in medications in past 1-2 weeks.  - Monitor mentation status - Trend fever curve and leukocytosis - Follow-up blood cultures and urine culture - Follow-up RVP - Follow-up UDS - Continue CTX  - Appreciate neurology recs - Follow-up EEG - PT/OT eval and treat   #Lactic acidosis  2.2 on admission. Suspect in setting of sepsis from UTI. S/p NS bolus in ED.  - Trend lactic acid - Continue LR 100 ml/hr    #pAfib on Xarelto   In NSR on exam.  PTA on metoprolol  and Xarelto . - Continue home Xarelto  20 mg and home metoprolol  50 mg twice daily   #SLE Follows with dermatology.  PTA on mycophenolate 1000 mg twice daily and hydroxychloroquine  200 mg daily. Unclear if still taking prednisone  20 mg, does have recent dispense on 7/8 by dermatology.  - Hold mycophenolate and prednisone  in setting of infection  - Continue home hydroxychloroquine     #HTN PTA on amlodipine  5 mg, losartan   100 mg daily.  BP mildly elevated today. Gradually restart BP meds.    #T2DM A1c 7.0 in June 2025.  PTA on metformin. CBG 140s on admission.    #DDD on chronic opioid use  Chronic/stable.  PTA on Percocet 7.5-325 mg every 6 hours as needed.  No acute pain at this time.  - Hold Percocet at this time but can restart for chronic pain mgmt   #Depression #Insomnia PTA on Wellbutrin  150 mg daily, duloxetine  60 mg daily, mirtazapine  30 mg nightly.   - Continue home Wellbutrin   - Hold mirtazapine  and duloxetine  at this time given AMS but likely will restart.     #CAD s/p PCI #HLD Chronic/stable. Continue home atorvastatin  40 mg, Zetia  10 mg and colestipol  1 g BID.

## 2024-06-28 NOTE — ED Provider Notes (Signed)
 [place] EMERGENCY DEPARTMENT AT [facility] Provider Note   CSN: [id number] Arrival date & time: 06/28/24  9557     Patient presents with: Code Stroke   David Weaver is a 73 y.o. male.  {Add pertinent medical, surgical, social history, OB history to HPI:3876} 73 year old male who presents the ER today secondary to a code stroke activation.  History obtained from patient, EMS and, neurology who spoke with the wife.  Sound like the patient was last known normal at 1:00 this afternoon.  The wife found him standing by the sink this evening not wanting to move.  Also had some garbled speech at that time.  EMS was called.  On their arrival they reported some right sided deficits in his arm and leg and with the thought was dysphasia.  Patient brought to the ER.  Found to have a temperature of 100.4 with EMS as well and a strong urine smell.  Patient states he does not feel well but cannot pinpoint why.        Prior to Admission medications   Medication Sig Start Date End Date Taking? Authorizing Provider  amLODipine  (NORVASC ) 5 MG tablet Take 5 mg by mouth daily.    [provider]  atorvastatin  (LIPITOR) 40 MG tablet Take 1 tablet (40 mg total) by mouth daily. 04/27/20   Meng, Hao, PA  Camphor-Menthol -Methyl Sal (SALONPAS) 3.12-06-08 % PTCH Place 1 patch onto the skin daily as needed (pain).    [provider]  carboxymethylcellulose (REFRESH PLUS) 0.5 % SOLN Place 1 drop into both eyes 3 (three) times daily as needed (dry eyes).    [provider]  chlorpheniramine (CHLOR-TRIMETON) 4 MG tablet Take 4 mg by mouth 2 (two) times daily as needed for allergies.    [provider]  Cholecalciferol (DIALYVITE VITAMIN D  5000) 125 MCG (5000 UT) capsule Take 5,000 Units by mouth daily.    [provider]  colestipol  (COLESTID ) 1 g tablet Take 1 g by mouth 2 (two) times daily. 03/05/18   [provider]  DULoxetine  (CYMBALTA ) 60 MG capsule  Take 1 capsule (60 mg total) 2 (two) times daily by mouth. 10/13/17   Claudene Rayfield HERO, MD  hydroxychloroquine  (PLAQUENIL ) 200 MG tablet TAKE 1 TABLET BY MOUTH TWICE A DAY 01/07/21   Cheryl Waddell HERO, PA-C  losartan  (COZAAR ) 100 MG tablet Take 1 tablet (100 mg total) by mouth daily. 09/12/21   Raford Riggs, MD  metFORMIN (GLUCOPHAGE) 500 MG tablet Take 500 mg by mouth daily. 09/22/20 06/17/24  [provider]  methocarbamol  (ROBAXIN ) 500 MG tablet Take 1 tablet (500 mg total) by mouth every 8 (eight) hours as needed for muscle spasms. 06/25/22   McBane, Aleck SAILOR, PA-C  methotrexate  (RHEUMATREX) 2.5 MG tablet Take 10 mg by mouth once a week. Caution:Chemotherapy. Protect from light.    [provider]  metoprolol  tartrate (LOPRESSOR ) 50 MG tablet Take 50 mg by mouth 2 (two) times daily.    [provider]  mirtazapine  (REMERON ) 15 MG tablet Take 15 mg by mouth at bedtime.  04/14/19   [provider]  OVER THE COUNTER MEDICATION Apply 1 Application topically daily as needed (leg pain). CBD Cream    [provider]  oxyCODONE  (OXY IR/ROXICODONE ) 5 MG immediate release tablet Take 5 mg by mouth every 6 (six) hours as needed for moderate pain or severe pain. 09/29/22   [provider]  pantoprazole  (PROTONIX ) 40 MG tablet TAKE 1 TABLET BY  MOUTH DAILY 07/24/20   Raford Riggs, MD  predniSONE  (DELTASONE ) 1 MG tablet Take 3 mg by mouth daily. 08/07/20   [provider]  rivaroxaban  (XARELTO ) 20 MG TABS tablet Take 1 tablet (20 mg total) by mouth daily with supper. 06/08/20   Emelia Josefa HERO, NP    Allergies: Patient has no known allergies.    Review of Systems  Updated Vital Signs BP (!) 151/79   Pulse 93   Temp 99.6 F (37.6 C) (Oral)   Resp (!) 27   Ht 5' 7 (1.702 m)   Wt 94.6 kg   SpO2 100%   BMI 32.66 kg/m   Physical Exam Vitals and nursing note reviewed.  Constitutional:      Appearance: He is well-developed.  HENT:      Head: Normocephalic and atraumatic.  Cardiovascular:     Rate and Rhythm: Normal rate.  Pulmonary:     Effort: Pulmonary effort is normal. No respiratory distress.  Abdominal:     General: There is no distension.  Musculoskeletal:        General: Normal range of motion.     Cervical back: Normal range of motion.  Neurological:     General: No focal deficit present.     Mental Status: He is alert and oriented to person, place, and time.     Comments: Generally weak but no obvious neurodeficits.  No facial droop, speech is slow but clear.  Good grip strength bilaterally good dorsi/plantarflexion bilaterally.     (all labs ordered are listed, but only abnormal results are displayed) Labs Reviewed  PROTIME-INR - Abnormal; Notable for the following components:      Result Value   Prothrombin Time 20.5 (*)    INR 1.7 (*)    All other components within normal limits  CBC WITH DIFFERENTIAL/PLATELET - Abnormal; Notable for the following components:   WBC 13.6 (*)    Hemoglobin 12.4 (*)    HCT 38.8 (*)    RDW 15.6 (*)    Neutro Abs 11.4 (*)    Abs Immature Granulocytes 0.08 (*)    All other components within normal limits  COMPREHENSIVE METABOLIC PANEL WITH GFR - Abnormal; Notable for the following components:   CO2 21 (*)    Glucose, Bld 145 (*)    Calcium  8.4 (*)    Total Protein 5.9 (*)    All other components within normal limits  POCT I-STAT, CHEM 8 - Abnormal; Notable for the following components:   Glucose, Bld 148 (*)    Calcium , Ion 1.10 (*)    Hemoglobin 12.9 (*)    HCT 38.0 (*)    All other components within normal limits  APTT  ETHANOL  URINALYSIS, ROUTINE W REFLEX MICROSCOPIC  I-STAT CHEM 8, ED  CBG MONITORING, ED  TROPONIN I (HIGH SENSITIVITY)    EKG: None  Radiology: DG Chest Portable 1 View Result Date: 06/28/2024 EXAM: 1 VIEW XRAY OF THE CHEST 06/28/2024 05:21:46 AM COMPARISON: 10/27/2022 CLINICAL HISTORY: Eval for pneumonia. Patient was disoriented and  was found standing in front of the bathroom sink. Patient was found confused and had urinated on himself. When EMS arrived patient had complaints of right arm and leg weakness, right side facial droop and aphasia. FINDINGS: LUNGS AND PLEURA: Low lung volumes Opacity within the left lung base compatible with atelectasis or pneumonia. No pulmonary edema. No pleural effusion. No pneumothorax. HEART AND MEDIASTINUM: No acute abnormality of the cardiac and mediastinal silhouettes. BONES AND SOFT  TISSUES: Signs of previous left shoulder arthroplasty. No acute osseous abnormality. IMPRESSION: 1. Low lung volumes 2. Opacity within the left lung base compatible with atelectasis or pneumonia. Electronically signed by: Waddell Calk MD 06/28/2024 05:44 AM EDT RP Workstation: [id number]   CT ANGIO HEAD NECK W WO CM W PERF (CODE STROKE) Result Date: 06/28/2024 CLINICAL DATA:  Code stroke.  73 year old male.  Right side deficit. EXAM: CT ANGIOGRAPHY HEAD AND NECK CT PERFUSION BRAIN TECHNIQUE: Multidetector CT imaging of the head and neck was performed using the standard protocol during bolus administration of intravenous contrast. Multiplanar CT image reconstructions and MIPs were obtained to evaluate the vascular anatomy. Carotid stenosis measurements (when applicable) are obtained utilizing NASCET criteria, using the distal internal carotid diameter as the denominator. Multiphase CT imaging of the brain was performed following IV bolus contrast injection. Subsequent parametric perfusion maps were calculated using RAPID software. RADIATION DOSE REDUCTION: This exam was performed according to the departmental dose-optimization program which includes automated exposure control, adjustment of the mA and/or kV according to patient size and/or use of iterative reconstruction technique. CONTRAST:  100 mL Omnipaque  350 COMPARISON:  Plain head CT today 0445 hours. Chest CT 06/16/2023. FINDINGS: CT Brain Perfusion Findings: ASPECTS:  10 CBF (<30%) Volume: 0mL Perfusion (Tmax>6.0s) volume: 0mL Mismatch Volume: Not applicable Infarction Location:Not applicable CTA NECK Skeleton: Largely absent dentition. Cervical spine disc and endplate degeneration. Chronic T5 compression fracture is stable from last year. No acute osseous abnormality identified. Upper chest: Stable visible upper lungs and no superior mediastinal lymphadenopathy. Other neck: Nonvascular neck soft tissue spaces are within normal limits. Aortic arch: 4 vessel arch, left vertebral artery arises directly from the arch. Calcified aortic atherosclerosis. And distal arch 2 cm area of bulky adherent soft plaque or thrombus (series 5, image 163 and series 8, image 116). Right carotid system: Brachiocephalic artery and right CCA origin tortuosity with minor plaque and no stenosis. Calcified plaque at the right ICA origin results in up to 50 % stenosis with respect to the distal vessel. Tortuous right ICA distal to the bulb and no additional stenosis to the skull base. Left carotid system: Left CCA origin plaque and tortuosity without stenosis. Calcified plaque at the left ICA origin and bulb, less than 50 % stenosis with respect to the distal vessel. Vertebral arteries: Tortuous proximal right subclavian artery. Calcified plaque near the right vertebral artery origin, but no convincing vertebral origin stenosis. Mildly tortuous right V1 segment. Right vertebral artery mildly dominant and patent to the skull base with no significant stenosis. Left vertebral artery arises directly from the arch with mild tortuosity. Left vertebral artery is mildly non dominant to the skull base, patent with mild plaque and no stenosis. CTA HEAD Posterior circulation: Patent 10 tortuous distal vertebral arteries, mildly dominant right V4. Patent vertebrobasilar junction without stenosis. Bilateral AICA appear dominant and patent. Patent basilar artery with some calcified plaque but no significant basilar  stenosis. Mildly ectatic basilar tip, SCA and PCA origins without stenosis. Small or absent posterior communicating arteries. Bilateral PCA branches are patent with mild irregularity, primarily in the P3 segments. Anterior circulation: Both ICA siphons are patent. Left siphon mild ectasia and moderate calcified plaque. Only mild left siphon stenosis results. Similar right siphon ectasia, moderate calcified plaque with only mild right siphon stenosis. Patent carotid termini. Normal MCA and ACA origins. Diminutive or absent anterior communicating artery. Bilateral ACA branches are within normal limits. Right MCA M1 segment bifurcates early without stenosis. Right MCA branches are within  normal limits. Left MCA M1 segment and bifurcation are patent without stenosis. Areas of questionable left MCA hyperdensity on plain CT remain patent and enhancing. Left MCA branches are within normal limits. Venous sinuses: Early contrast timing, grossly patent. Anatomic variants: Left vertebral artery arises directly from the aortic arch and is mildly non dominant. Review of the MIP images confirms the above findings IMPRESSION: 1. Negative CT Perfusion. CTA is negative for large vessel occlusion. 2. Atherosclerosis in the head and neck, including a bulky 2 cm area of adherent soft plaque or thrombus in the distal Aortic Arch. Bilateral carotid bifurcation and bilateral ICA siphon calcified atherosclerosis but no hemodynamically significant arterial stenosis identified. Aortic Atherosclerosis (ICD10-I70.0). Electronically Signed   By: VEAR Hurst M.D.   On: 06/28/2024 04:59   CT HEAD CODE STROKE WO CONTRAST Result Date: 06/28/2024 CLINICAL DATA:  Code stroke.  73 year old male.  Right side deficit. EXAM: CT HEAD WITHOUT CONTRAST TECHNIQUE: Contiguous axial images were obtained from the base of the skull through the vertex without intravenous contrast. RADIATION DOSE REDUCTION: This exam was performed according to the departmental  dose-optimization program which includes automated exposure control, adjustment of the mA and/or kV according to patient size and/or use of iterative reconstruction technique. COMPARISON:  Brain MRI 12/26/2005. FINDINGS: Brain: Cerebral volume is within normal limits for age. No midline shift, ventriculomegaly, mass effect, evidence of mass lesion, intracranial hemorrhage or evidence of cortically based acute infarction. Patchy and confluent bilateral cerebral white matter hypodensity, progressed from MRI findings in 2007. Punctate right basal ganglia vascular calcification. Heterogeneity in the deep gray nuclei, most notably the left thalamus. No cortical encephalomalacia identified. Vascular: Calcified atherosclerosis at the skull base. Questionable abnormal hyperdensity of the left MCA sagittal images 43-46. Skull: Mild motion artifact. Intact. No acute osseous abnormality identified. Sinuses/Orbits: Visualized paranasal sinuses and mastoids are clear. Other: No gaze deviation. Postoperative changes to the right globe. Visualized scalp soft tissues are within normal limits. ASPECTS [facility] Stroke Program Early CT Score) Total score (0-10 with 10 being normal): 10 IMPRESSION: 1. Suspicious asymmetric hyperdensity of the left MCA, but no cortically based acute infarct or intracranial hemorrhage identified. ASPECTS 10. 2. Advanced changes of small vessel disease in the cerebral white matter, and deep gray matter heterogeneity including in the left thalamus. 3. These results were communicated to Dr. Michaela at 4:48 am on 06/28/2024 by text page via the [facility] messaging system. Electronically Signed   By: VEAR Hurst M.D.   On: 06/28/2024 04:50    {Document cardiac monitor, telemetry assessment procedure when appropriate:32947} .Critical Care  Performed by: Lorette Mayo, MD Authorized by: Lorette Mayo, MD   Critical care provider statement:    Critical care time (minutes):  30   Critical care was necessary  to treat or prevent imminent or life-threatening deterioration of the following conditions:  CNS failure or compromise   Critical care was time spent personally by me on the following activities:  Development of treatment plan with patient or surrogate, discussions with consultants, evaluation of patient's response to treatment, examination of patient, ordering and review of laboratory studies, ordering and review of radiographic studies, ordering and performing treatments and interventions, pulse oximetry, re-evaluation of patient's condition and review of old charts    Medications Ordered in the ED  sodium chloride  flush (NS) 0.9 % injection 3 mL (has no administration in time range)  iohexol  (OMNIPAQUE ) 350 MG/ML injection 100 mL (100 mLs Intravenous Contrast Given 06/28/24 0452)      {  Click here for ABCD2, HEART and other calculators REFRESH Note before signing:1}                              Medical Decision Making Amount and/or Complexity of Data Reviewed Labs: ordered. Radiology: ordered.   73 year old male as a code stroke but more likely some type of metabolic or infectious process.  Strong smell of urine so we will need UTI.  Chest x-ray is clear I doubt pneumonia.  No meningismus to suggest meningitis.  Neurology involved and if no obvious infectious source identified then would require an MRI and EEG as he does have a history of seizures.  However if infectious source is identified to and no further neurologic workup is recommended.  {Document critical care time when appropriate  Document review of labs and clinical decision tools ie CHADS2VASC2, etc  Document your independent review of radiology images and any outside records  Document your discussion with family members, caretakers and with consultants  Document social determinants of health affecting pt's care  Document your decision making why or why not admission, treatments were needed:32947:::1}   Final diagnoses:  None     ED Discharge Orders     None

## 2024-06-28 NOTE — ED Notes (Signed)
 CCMD called.

## 2024-06-28 NOTE — Consult Note (Signed)
 NEUROLOGY CONSULT NOTE   Date of service: June 28, 2024 Patient Name: David Weaver MRN:  [id number] DOB:  [birth year] Chief Complaint: Altered mental status Requesting Provider: Lorette Mayo, MD  History of Present Illness  David Weaver is a 73 y.o. male with hx of lupus, hypercholesterolemia, CAD, A-fib who presents with altered mental status.  His wife states that he was last his normal self around 1 PM when she talked to him.  He then complained of some pain under his right arm and would to lay down.  He apparently ate dinner and was okay, but she went to bed and then around 3 AM got up to use the bathroom.  She found him in the bathroom standing over the sink and acting confused.  He had wet himself and refused to move out of that position.  When she would ask him things, he would mumble responses but seemed confused.  She called 911 and changed his underwear, but he wet himself again.  On EMS arrival, they asked them if he felt weaker on one side than the other and he apparently said the right side, but I am not certain of the reliability of his responses.  He was brought in as a code stroke.  LKW: 1 PM IV Thrombolysis: No, anticoagulated EVT: No, no LVO  NIHSS components Score: Comment  1a Level of Conscious 0[]  1[]  2[]  3[]      1b LOC Questions 0[]  1[x]  2[]       1c LOC Commands 0[]  1[]  2[]       2 Best Gaze 0[]  1[]  2[]       3 Visual 0[]  1[]  2[]  3[]      4 Facial Palsy 0[]  1[]  2[]  3[]      5a Motor Arm - left 0[]  1[]  2[]  3[]  4[]  UN[]    5b Motor Arm - Right 0[]  1[]  2[]  3[]  4[]  UN[]    6a Motor Leg - Left 0[]  1[]  2[]  3[]  4[]  UN[]    6b Motor Leg - Right 0[]  1[]  2[]  3[]  4[]  UN[]    7 Limb Ataxia 0[]  1[]  2[]  UN[]      8 Sensory 0[]  1[]  2[]  UN[]      9 Best Language 0[]  1[]  2[]  3[]      10 Dysarthria 0[]  1[]  2[]  UN[]      11 Extinct. and Inattention 0[]  1[]  2[]       TOTAL: 1     Past History   Past Medical History:  Diagnosis Date   Alcohol  abuse, daily use    Allergic rhinitis,  cause unspecified    Angina pectoris ([name]) 03/26/2020   Aortic atherosclerosis ([name]) 01/06/2021   Atypical mole 10/19/2019   Right Cheek (widershave)   Dysthymic disorder    Esophageal reflux    Essential hypertension, benign    Fibromyalgia    followed by [name] every six months.   Fibromyositis    Hypertriglyceridemia 07/20/2020   Impotence of organic origin    Insomnia, unspecified    Left knee DJD    Lupus erythematosus    Osteoarthrosis, unspecified whether generalized or localized, lower leg    Other abnormal glucose    Other specified disorder of male genital organs(608.89)    Pure hypercholesterolemia 07/20/2020   S/P coronary artery stent placement 07/20/2020   LAD PCI 03/2020   Systemic lupus erythematosus ([name])    Unspecified hearing loss    Unspecified vitamin D  deficiency     Past Surgical History:  Procedure Laterality Date   CARPAL TUNNEL RELEASE  bilateral   CHOLECYSTECTOMY     COLONOSCOPY  03/01/2014   two polyps; Dr. Rollin.  Repeat 5 years.   CORONARY STENT INTERVENTION N/A 04/06/2020   Procedure: CORONARY STENT INTERVENTION;  Surgeon: Darron Deatrice LABOR, MD;  Location: [facility];  Service: Cardiovascular;  Laterality: N/A;   GANGLION CYST EXCISION  1980   Left wrist   JOINT REPLACEMENT  01/2013   right knee, left knee. [facility].   KNEE ARTHROPLASTY     KNEE ARTHROSCOPY  2008   Right   LEFT HEART CATH AND CORONARY ANGIOGRAPHY N/A 04/06/2020   Procedure: LEFT HEART CATH AND CORONARY ANGIOGRAPHY;  Surgeon: Darron Deatrice LABOR, MD;  Location: [facility];  Service: Cardiovascular;  Laterality: N/A;   lumbar spine epidural  2009   [facility]   REVERSE SHOULDER ARTHROPLASTY Left 06/25/2022   Procedure: REVERSE SHOULDER ARTHROPLASTY;  Surgeon: Cristy Bonner DASEN, MD;  Location: [facility];  Service: Orthopedics;  Laterality: Left;   ROTATOR CUFF REPAIR  10/2008   right   TOTAL KNEE ARTHROPLASTY  12/27/2012   Procedure: TOTAL KNEE ARTHROPLASTY;   Surgeon: Lamar LABOR Millman, MD;  Location: [facility];  Service: Orthopedics;  Laterality: Right;  RIGHT ARTHROPLASTY KNEE MEDIAL AND LATERAL COMPARTMENTS WITH PATELLA RESURFACING, RIGHT TOTAL KNEE REPLACEMENT   TOTAL KNEE ARTHROPLASTY Left 01/31/2013   Procedure: TOTAL KNEE ARTHROPLASTY;  Surgeon: Lamar LABOR Millman, MD;  Location: [facility];  Service: Orthopedics;  Laterality: Left;   TOTAL SHOULDER ARTHROPLASTY      Family History: Family History  Problem Relation Age of Onset   Alzheimer's disease Mother    Osteoporosis Mother    Heart disease Father 68       AMI   Alcohol  abuse Father    Cancer Brother        colon cancer   Diabetes Brother    Kidney failure Brother     Social History  reports that he quit smoking about 9 years ago. His smoking use included cigarettes. He started smoking about 59 years ago. He has a 75 pack-year smoking history. He has never used smokeless tobacco. He reports current alcohol  use of about 14.0 - 21.0 standard drinks of alcohol  per week. He reports that he does not currently use drugs.  No Known Allergies  Medications   Current Facility-Administered Medications:    sodium chloride  flush (NS) 0.9 % injection 3 mL, 3 mL, Intravenous, Once, Mesner, Selinda, MD   triamcinolone  acetonide (KENALOG ) 10 MG/ML injection 10 mg, 10 mg, Other, Once, Regal, [place] RAMAN, DPM  Current Outpatient Medications:    amLODipine  (NORVASC ) 5 MG tablet, Take 5 mg by mouth daily., Disp: , Rfl:    atorvastatin  (LIPITOR) 40 MG tablet, Take 1 tablet (40 mg total) by mouth daily., Disp: 90 tablet, Rfl: 3   Camphor-Menthol -Methyl Sal (SALONPAS) 3.12-06-08 % PTCH, Place 1 patch onto the skin daily as needed (pain)., Disp: , Rfl:    carboxymethylcellulose (REFRESH PLUS) 0.5 % SOLN, Place 1 drop into both eyes 3 (three) times daily as needed (dry eyes)., Disp: , Rfl:    chlorpheniramine (CHLOR-TRIMETON) 4 MG tablet, Take 4 mg by mouth 2 (two) times daily as needed for allergies., Disp: , Rfl:     Cholecalciferol (DIALYVITE VITAMIN D  5000) 125 MCG (5000 UT) capsule, Take 5,000 Units by mouth daily., Disp: , Rfl:    colestipol  (COLESTID ) 1 g tablet, Take 1 g by mouth 2 (two) times daily., Disp: , Rfl: 5  DULoxetine  (CYMBALTA ) 60 MG capsule, Take 1 capsule (60 mg total) 2 (two) times daily by mouth., Disp: 180 capsule, Rfl: 3   hydroxychloroquine  (PLAQUENIL ) 200 MG tablet, TAKE 1 TABLET BY MOUTH TWICE A DAY, Disp: 180 tablet, Rfl: 0   losartan  (COZAAR ) 100 MG tablet, Take 1 tablet (100 mg total) by mouth daily., Disp: 90 tablet, Rfl: 3   metFORMIN (GLUCOPHAGE) 500 MG tablet, Take 500 mg by mouth daily., Disp: , Rfl:    methocarbamol  (ROBAXIN ) 500 MG tablet, Take 1 tablet (500 mg total) by mouth every 8 (eight) hours as needed for muscle spasms., Disp: 30 tablet, Rfl: 0   methotrexate  (RHEUMATREX) 2.5 MG tablet, Take 10 mg by mouth once a week. Caution:Chemotherapy. Protect from light., Disp: , Rfl:    metoprolol  tartrate (LOPRESSOR ) 50 MG tablet, Take 50 mg by mouth 2 (two) times daily., Disp: , Rfl:    mirtazapine  (REMERON ) 15 MG tablet, Take 15 mg by mouth at bedtime. , Disp: , Rfl:    OVER THE COUNTER MEDICATION, Apply 1 Application topically daily as needed (leg pain). CBD Cream, Disp: , Rfl:    oxyCODONE  (OXY IR/ROXICODONE ) 5 MG immediate release tablet, Take 5 mg by mouth every 6 (six) hours as needed for moderate pain or severe pain., Disp: , Rfl:    pantoprazole  (PROTONIX ) 40 MG tablet, TAKE 1 TABLET BY MOUTH DAILY, Disp: 90 tablet, Rfl: 1   predniSONE  (DELTASONE ) 1 MG tablet, Take 3 mg by mouth daily., Disp: , Rfl:    rivaroxaban  (XARELTO ) 20 MG TABS tablet, Take 1 tablet (20 mg total) by mouth daily with supper., Disp: 90 tablet, Rfl: 1  Vitals   Vitals:   Jul 24, 2024 0506  BP: (!) 144/81  Pulse: 95  Resp: (!) 21  Temp: 99.6 F (37.6 C)  TempSrc: Oral  SpO2: 100%  Weight: 94.6 kg  Height: 5' 7 (1.702 m)    Body mass index is 32.66 kg/m.   Physical Exam    Constitutional: Appears well-developed and well-nourished.   Neurologic Examination    Neuro: Mental Status: Patient is awake, alert, he does appear confused and takes slightly longer to answer some questions than he typically does.  He is able to name simple objects.  He gives his age correctly, but is unable to give the correct month or year. Cranial Nerves: II: Visual Fields are full. Pupils are equal, round, and reactive to light.   III,IV, VI: EOMI without ptosis or diploplia.  V: Facial sensation is symmetric to temperature VII: Facial movement is symmetric.  VIII: hearing is intact to voice X: Uvula elevates symmetrically XII: tongue is midline without atrophy or fasciculations.  Motor: Tone is normal. Bulk is normal. 5/5 strength was present in all four extremities.  Sensory: Sensation is symmetric to light touch and temperature in the arms and legs. Cerebellar: FNF intact        Labs/Imaging/Neurodiagnostic studies   CBC:  Recent Labs  Lab 24-Jul-2024 0440 July 24, 2024 0446  WBC  --  13.6*  NEUTROABS  --  11.4*  HGB 12.9* 12.4*  HCT 38.0* 38.8*  MCV  --  87.2  PLT  --  170   Basic Metabolic Panel:  Lab Results  Component Value Date   NA 137 2024/07/24   K 3.7 July 24, 2024   CO2 21 (L) Jul 24, 2024   GLUCOSE 145 (H) 2024-07-24   BUN 14 July 24, 2024   CREATININE 0.75 07/24/24   CALCIUM  8.4 (L) 07/24/24   GFRNONAA >60 July 24, 2024  GFRAA 116 01/08/2021   Lipid Panel:  Lab Results  Component Value Date   LDLCALC 33 02/17/2023   HgbA1c:  Lab Results  Component Value Date   HGBA1C 6.4 (H) 10/28/2022   Urine Drug Screen: No results found for: LABOPIA, COCAINSCRNUR, LABBENZ, AMPHETMU, THCU, LABBARB  Alcohol  Level     Component Value Date/Time   [facility] <15 06/28/2024 0446   INR  Lab Results  Component Value Date   INR 1.7 (H) 06/28/2024   APTT  Lab Results  Component Value Date   APTT 30 06/28/2024    CT Head without  contrast(Personally reviewed): negative  CT angio Head and Neck with contrast(Personally reviewed): negative   ASSESSMENT   HERRON FERO is a 73 y.o. male with altered mental status of unclear etiology.  He had a low-grade fever (100.4) reported and route and there was report of foul-smelling urine.  If he does have a UTI, this would be a likely explanation.  I have relatively low suspicion for stroke or TIA, but if no other explanation is found then an MRI would not be unreasonable.  The episode while standing at his sink sounds more concerning for seizure than stroke or TIA, therefore an EEG could be considered.  RECOMMENDATIONS  Urinalysis EEG Further infectious workup per ED/IM If no other cause for his altered mental status is found, could consider MRI brain.  ______________________________________________________________________    Bonney Aisha Seals, MD [facility]

## 2024-06-28 NOTE — Procedures (Signed)
 Patient Name: LUSTER HECHLER  MRN: [id number]  Epilepsy Attending: Pastor Falling  Referring Physician/Provider:  Date: 06/28/2024 Duration:  27 minutes   Patient history: 73 y.o. male with altered mental status of unclear etiology.  EEG to rule out seizure   Level of alertness: Awake, drowsy,  AEDs during EEG study:   Technical aspects: This EEG study was done with scalp electrodes positioned according to the 10-20 International system of electrode placement. Electrical activity was reviewed with band pass filter of 1-70Hz , sensitivity of 7 uV/mm, display speed of 14mm/sec with a 60Hz  notched filter applied as appropriate. EEG data were recorded continuously and digitally stored.  Video monitoring was available and reviewed as appropriate.  Description: The posterior dominant rhythm consists of 3-7 Hz activity of moderate voltage (25-35 uV) seen predominantly in posterior head regions, symmetric and reactive to eye opening and eye closing. Drowsiness was characterized by attenuation of the posterior background rhythm. Sleep was not seen.  EEG showed continuous generalized polymorphic sharply contoured 3 to 6 Hz theta-delta slowing. Physiologic photic driving was not seen during photic stimulation.  Hyperventilation was not performed.     ABNORMALITY - Continuous slow, generalized   IMPRESSION: This study is suggestive of mild to moderate diffuse encephalopathy, nonspecific etiology but likely related to sedation, toxic-metabolic etiology.  No seizures or epileptiform discharges were seen throughout the recording.   Pastor Falling MD Neurology

## 2024-06-28 NOTE — Progress Notes (Signed)
 EEG complete, results are pending.

## 2024-06-28 NOTE — ED Notes (Signed)
 Nt called CCMD@9 :26am

## 2024-06-28 NOTE — ED Notes (Signed)
 Changed patient, patient linens and repositioned the patient.

## 2024-06-28 NOTE — Sepsis Progress Note (Signed)
 Sepsis protocol monitored by eLink ?

## 2024-06-28 NOTE — H&P (Addendum)
 Date: 06/28/2024               Patient Name:  David Weaver MRN: [id number]  DOB: 1951-08-18 Age / Sex: 73 y.o., male   PCP: Claudene Rayfield HERO, MD         Medical Service: Internal Medicine Teaching Service         Attending Physician: Dr. Eben Reyes BROCKS, MD      First Contact: Geryl King, DO     Pager:  [phone]  Second Contact: Dr. Libby Blanch, DO   Pager:  [phone]       After Hours  (After 5pm / First Contact Pager: [phone]  weekends / holidays): Second Contact Pager: [phone]   SUBJECTIVE   Chief Complaint: AMS  History of Present Illness: David Weaver is a 73 y.o. male with PMH of pA-fib on Xarelto , HTN, HLD, CAD s/p PCI, SLE, T2DM controlled A1c 7.0, DDD on chronic opioid use, depression.   Patient was altered during my exam so information obtained mainly from patient's wife and EDP.  Wife reports finding patient standing at bathroom sink around 3 AM this morning.  Had urinary incontinence.  Difficulty ambulating back so she called EMS.  Reports he was feeling well and no acute concerns the day prior to admission and last well-known was 9:30 PM on 7/28.   Per wife, patient endorsed increased urinary frequency and dysuria for past few days.  Noted foul urine odor about 2 days ago.  Reports subjective fever.   Denies new cough or worsening shortness of breath.  No sick contacts recently.  Being treated by dermatology for cutaneous lupus and was completing prednisone .  No new skin findings per per wife.  Came in initially due to EMS report of possibly right sided weakness and code stroke initiated which was subsequently canceled by neurology.  No recent medication changes in the past 1 to 2 weeks.  Denies chest pain, shortness of breath, nausea, vomiting. Denies hx of seizure in past or childhood per wife.   ED Course: Vitals were BP 144/81, HR 90-100, temp 99.6, 4 L [place] [facility] significant for leukocytosis of 13.6, lactic acid 2.2, UA with nitrites and leukocytes with  many bacteria. Imaging: CT head without cortically based acute infarct or hemorrhage but noted asymmetric hyperdensity of L MCA. CXR with low lung volumes, question opacity of left lung base atelectasis versus pneumonia Received azithromycin , ceftriaxone , NS 1 L Consulted neurology by EDP given arrival for code stroke  Meds:  Wife reports adherence and last dose was yesterday evening. -Adalimumab -Alendronate 70 mg weekly -Amlodipine  5 mg daily -Atorvastatin  40 mg daily -Bupropion  150 mg daily -Vitamin D  daily  -colestipol  1 g twice daily -Duloxetine  60 mg twice daily (reports taking daily instead) -Zetia  10 mg daily -Pepcid  20 mg daily -Hydroxychloroquine  200 mg twice daily -Losartan  100 mg daily -Metformin 500 mg twice daily -Robaxin  500 mg as needed -Metoprolol  50 mg twice daily -Mirtazapine  30 mg nightly -Mycophenolate 1000 mg twice daily -Percocet 7.5-3 25 every 6 hours as needed -Pantoprazole  40 mg daily -?Prednisone  20 mg daily (unclear if still taking) -Xarelto  20 mg in the evening (last dose 7/28) Current Facility-Administered Medications for the 06/28/24 encounter [facility] Encounter)  Medication   triamcinolone  acetonide (KENALOG ) 10 MG/ML injection 10 mg   Current Meds  Medication Sig   Adalimumab-bwwd 40 MG/0.4ML SOAJ Inject 40 mg into the skin every 14 (fourteen) days.   alendronate (FOSAMAX) 70 MG tablet Take 70 mg by mouth  once a week. Monday   amLODipine  (NORVASC ) 5 MG tablet Take 5 mg by mouth daily.   atorvastatin  (LIPITOR) 40 MG tablet Take 1 tablet (40 mg total) by mouth daily.   buPROPion  (WELLBUTRIN  XL) 150 MG 24 hr tablet Take 150 mg by mouth daily.   Camphor-Menthol -Methyl Sal (SALONPAS) 3.12-06-08 % PTCH Place 1 patch onto the skin daily as needed (pain).   carboxymethylcellulose (REFRESH PLUS) 0.5 % SOLN Place 1 drop into both eyes 3 (three) times daily as needed (dry eyes).   Cholecalciferol (DIALYVITE VITAMIN D  5000) 125 MCG (5000 UT) capsule Take  5,000 Units by mouth daily.   colestipol  (COLESTID ) 1 g tablet Take 1 g by mouth 2 (two) times daily.   DULoxetine  (CYMBALTA ) 60 MG capsule Take 1 capsule (60 mg total) 2 (two) times daily by mouth. (Patient taking differently: Take 60 mg by mouth daily.)   ezetimibe  (ZETIA ) 10 MG tablet Take 10 mg by mouth daily.   famotidine  (PEPCID ) 20 MG tablet Take 20 mg by mouth daily.   folic acid  (FOLVITE ) 1 MG tablet Take 1 mg by mouth daily.   hydroxychloroquine  (PLAQUENIL ) 200 MG tablet TAKE 1 TABLET BY MOUTH TWICE A DAY   losartan  (COZAAR ) 100 MG tablet Take 1 tablet (100 mg total) by mouth daily.   melatonin 5 MG TABS Take 5 mg by mouth at bedtime as needed (sleep).   metFORMIN (GLUCOPHAGE) 500 MG tablet Take 500 mg by mouth 2 (two) times daily with a meal.   methocarbamol  (ROBAXIN ) 500 MG tablet Take 1 tablet (500 mg total) by mouth every 8 (eight) hours as needed for muscle spasms.   metoprolol  tartrate (LOPRESSOR ) 50 MG tablet Take 50 mg by mouth 2 (two) times daily.   mirtazapine  (REMERON ) 30 MG tablet Take 30 mg by mouth at bedtime.   mycophenolate (CELLCEPT) 500 MG tablet Take 1,000 mg by mouth 2 (two) times daily.   oxyCODONE -acetaminophen  (PERCOCET) 7.5-325 MG tablet Take 1 tablet by mouth every 6 (six) hours as needed for severe pain (pain score 7-10) or moderate pain (pain score 4-6).   pantoprazole  (PROTONIX ) 40 MG tablet TAKE 1 TABLET BY MOUTH DAILY   rivaroxaban  (XARELTO ) 20 MG TABS tablet Take 1 tablet (20 mg total) by mouth daily with supper.    Past Medical History - HTN, T2DM, SLE, CAD s/p LAD PCI, P A-fib on Xarelto , DDD on chronic opioid use  Past Medical History:  Diagnosis Date   Alcohol  abuse, daily use    Allergic rhinitis, cause unspecified    Angina pectoris ([name]) 03/26/2020   Aortic atherosclerosis ([name]) 01/06/2021   Atypical mole 10/19/2019   Right Cheek (widershave)   Dysthymic disorder    Esophageal reflux    Essential hypertension, benign    Fibromyalgia     followed by [name] every six months.   Fibromyositis    Hypertriglyceridemia 07/20/2020   Impotence of organic origin    Insomnia, unspecified    Left knee DJD    Lupus erythematosus    Osteoarthrosis, unspecified whether generalized or localized, lower leg    Other abnormal glucose    Other specified disorder of male genital organs(608.89)    Pure hypercholesterolemia 07/20/2020   S/P coronary artery stent placement 07/20/2020   LAD PCI 03/2020   Systemic lupus erythematosus ([name])    Unspecified hearing loss    Unspecified vitamin D  deficiency     Past Surgical History Past Surgical History:  Procedure Laterality Date   CARPAL TUNNEL RELEASE  bilateral   CHOLECYSTECTOMY     COLONOSCOPY  03/01/2014   two polyps; Dr. Rollin.  Repeat 5 years.   CORONARY STENT INTERVENTION N/A 04/06/2020   Procedure: CORONARY STENT INTERVENTION;  Surgeon: Darron Deatrice LABOR, MD;  Location: [facility];  Service: Cardiovascular;  Laterality: N/A;   GANGLION CYST EXCISION  1980   Left wrist   JOINT REPLACEMENT  01/2013   right knee, left knee. [place].   KNEE ARTHROPLASTY     KNEE ARTHROSCOPY  2008   Right   LEFT HEART CATH AND CORONARY ANGIOGRAPHY N/A 04/06/2020   Procedure: LEFT HEART CATH AND CORONARY ANGIOGRAPHY;  Surgeon: Darron Deatrice LABOR, MD;  Location: [facility];  Service: Cardiovascular;  Laterality: N/A;   lumbar spine epidural  2009   [facility]   REVERSE SHOULDER ARTHROPLASTY Left 06/25/2022   Procedure: REVERSE SHOULDER ARTHROPLASTY;  Surgeon: Cristy Bonner DASEN, MD;  Location: [facility];  Service: Orthopedics;  Laterality: Left;   ROTATOR CUFF REPAIR  10/2008   right   TOTAL KNEE ARTHROPLASTY  12/27/2012   Procedure: TOTAL KNEE ARTHROPLASTY;  Surgeon: Lamar LABOR Millman, MD;  Location: [facility];  Service: Orthopedics;  Laterality: Right;  RIGHT ARTHROPLASTY KNEE MEDIAL AND LATERAL COMPARTMENTS WITH PATELLA RESURFACING, RIGHT TOTAL KNEE REPLACEMENT   TOTAL KNEE ARTHROPLASTY  Left 01/31/2013   Procedure: TOTAL KNEE ARTHROPLASTY;  Surgeon: Lamar LABOR Millman, MD;  Location: [facility];  Service: Orthopedics;  Laterality: Left;   TOTAL SHOULDER ARTHROPLASTY      Social:  Lives With: wife Occupation: retired Support: family  Level of Function: independent with ADLs and IADLs PCP: Claudene Rayfield HERO, MD Substances: -Tobacco: former quit 10 years (2 ppd x 30-40 years) -Alcohol : 2 beers daily -Recreational Drug: denies  Family History:  Family History  Problem Relation Age of Onset   Alzheimer's disease Mother    Osteoporosis Mother    Heart disease Father 3       AMI   Alcohol  abuse Father    Cancer Brother        colon cancer   Diabetes Brother    Kidney failure Brother     Allergies: Allergies as of 06/28/2024   (No Known Allergies)    Review of Systems: A complete ROS was negative except as per HPI.   OBJECTIVE:   Physical Exam: Blood pressure 131/67, pulse 93, temperature 99.6 F (37.6 C), temperature source Oral, resp. rate (!) 24, height 5' 7 (1.702 m), weight 94.6 kg, SpO2 95% on RA.  Constitutional: Alert, mildly ill-appearing male sitting comfortably, in no acute distress HENT: mucous membranes moist Eyes: EOMI Cardiovascular: Regular rate and rhythm, 2+ DP pulses bilaterally  Pulmonary/Chest: Normal work of breathing on room air, equal breath sounds bilaterally, no wheezing or crackles  Abdominal: bowel sounds present, soft, non-distended, non-tender, no guarding  MSK: no LE edema Neurological: alert & oriented x 2 to self and place not time, CN II-XII intact, no facial droop, answers questions and following commands, intact sensation in all extremities, 5/5 strength in all extremities   Labs: CBC    Component Value Date/Time   WBC 13.6 (H) 06/28/2024 0446   RBC 4.45 06/28/2024 0446   HGB 12.4 (L) 06/28/2024 0446   HGB 15.5 08/04/2018 1654   HCT 38.8 (L) 06/28/2024 0446   HCT 47.1 08/04/2018 1654   PLT 170 06/28/2024 0446   PLT 279  08/04/2018 1654   MCV 87.2 06/28/2024 0446   MCV 86 08/04/2018 1654  MCH 27.9 06/28/2024 0446   MCHC 32.0 06/28/2024 0446   RDW 15.6 (H) 06/28/2024 0446   RDW 13.5 08/04/2018 1654   LYMPHSABS 1.0 06/28/2024 0446   LYMPHSABS 1.3 08/04/2018 1654   MONOABS 1.0 06/28/2024 0446   EOSABS 0.1 06/28/2024 0446   EOSABS 0.2 08/04/2018 1654   BASOSABS 0.0 06/28/2024 0446   BASOSABS 0.1 08/04/2018 1654     CMP     Component Value Date/Time   NA 137 06/28/2024 0446   NA 140 11/18/2022 0955   K 3.7 06/28/2024 0446   CL 103 06/28/2024 0446   CO2 21 (L) 06/28/2024 0446   GLUCOSE 145 (H) 06/28/2024 0446   BUN 14 06/28/2024 0446   BUN 14 11/18/2022 0955   CREATININE 0.75 06/28/2024 0446   CREATININE 0.62 (L) 01/08/2021 1517   CALCIUM  8.4 (L) 06/28/2024 0446   PROT 5.9 (L) 06/28/2024 0446   PROT 6.2 04/29/2024 1357   ALBUMIN 3.5 06/28/2024 0446   ALBUMIN 4.1 04/29/2024 1357   AST 19 06/28/2024 0446   ALT 23 06/28/2024 0446   ALKPHOS 45 06/28/2024 0446   BILITOT 0.7 06/28/2024 0446   BILITOT 0.3 04/29/2024 1357   GFRNONAA >60 06/28/2024 0446   GFRNONAA 100 01/08/2021 1517   GFRAA 116 01/08/2021 1517    Imaging:  DG Chest Portable 1 View EXAM: 1 VIEW XRAY OF THE CHEST 06/28/2024 05:21:46 AM  COMPARISON: 10/27/2022  CLINICAL HISTORY: Eval for pneumonia. Patient was disoriented and was found standing in front of the bathroom sink. Patient was found confused and had urinated on himself. When EMS arrived patient had complaints of right arm and leg weakness, right side facial droop and aphasia.  FINDINGS:  LUNGS AND PLEURA: Low lung volumes Opacity within the left lung base compatible with atelectasis or pneumonia. No pulmonary edema. No pleural effusion. No pneumothorax.  HEART AND MEDIASTINUM: No acute abnormality of the cardiac and mediastinal silhouettes.  BONES AND SOFT TISSUES: Signs of previous left shoulder arthroplasty. No acute osseous  abnormality.  IMPRESSION: 1. Low lung volumes 2. Opacity within the left lung base compatible with atelectasis or pneumonia.  Electronically signed by: Waddell Calk MD 06/28/2024 05:44 AM EDT RP Workstation: [id number] CT ANGIO HEAD NECK W WO CM W PERF (CODE STROKE) CLINICAL DATA:  Code stroke.  73 year old male.  Right side deficit.  EXAM: CT ANGIOGRAPHY HEAD AND NECK  CT PERFUSION BRAIN  TECHNIQUE: Multidetector CT imaging of the head and neck was performed using the standard protocol during bolus administration of intravenous contrast. Multiplanar CT image reconstructions and MIPs were obtained to evaluate the vascular anatomy. Carotid stenosis measurements (when applicable) are obtained utilizing NASCET criteria, using the distal internal carotid diameter as the denominator.  Multiphase CT imaging of the brain was performed following IV bolus contrast injection. Subsequent parametric perfusion maps were calculated using RAPID software.  RADIATION DOSE REDUCTION: This exam was performed according to the departmental dose-optimization program which includes automated exposure control, adjustment of the mA and/or kV according to patient size and/or use of iterative reconstruction technique.  CONTRAST:  100 mL Omnipaque  350  COMPARISON:  Plain head CT today 0445 hours.  Chest CT 06/16/2023.  FINDINGS: CT Brain Perfusion Findings:  ASPECTS: 10  CBF (<30%) Volume: 0mL  Perfusion (Tmax>6.0s) volume: 0mL  Mismatch Volume: Not applicable  Infarction Location:Not applicable  CTA NECK  Skeleton: Largely absent dentition. Cervical spine disc and endplate degeneration. Chronic T5 compression fracture is stable from last year. No acute osseous abnormality identified.  Upper chest: Stable visible upper lungs and no superior mediastinal lymphadenopathy.  Other neck: Nonvascular neck soft tissue spaces are within normal limits.  Aortic arch: 4 vessel arch, left  vertebral artery arises directly from the arch. Calcified aortic atherosclerosis. And distal arch 2 cm area of bulky adherent soft plaque or thrombus (series 5, image 163 and series 8, image 116).  Right carotid system: Brachiocephalic artery and right CCA origin tortuosity with minor plaque and no stenosis. Calcified plaque at the right ICA origin results in up to 50 % stenosis with respect to the distal vessel. Tortuous right ICA distal to the bulb and no additional stenosis to the skull base.  Left carotid system: Left CCA origin plaque and tortuosity without stenosis. Calcified plaque at the left ICA origin and bulb, less than 50 % stenosis with respect to the distal vessel.  Vertebral arteries: Tortuous proximal right subclavian artery. Calcified plaque near the right vertebral artery origin, but no convincing vertebral origin stenosis. Mildly tortuous right V1 segment. Right vertebral artery mildly dominant and patent to the skull base with no significant stenosis.  Left vertebral artery arises directly from the arch with mild tortuosity. Left vertebral artery is mildly non dominant to the skull base, patent with mild plaque and no stenosis.  CTA HEAD  Posterior circulation: Patent 10 tortuous distal vertebral arteries, mildly dominant right V4. Patent vertebrobasilar junction without stenosis. Bilateral AICA appear dominant and patent. Patent basilar artery with some calcified plaque but no significant basilar stenosis. Mildly ectatic basilar tip, SCA and PCA origins without stenosis. Small or absent posterior communicating arteries. Bilateral PCA branches are patent with mild irregularity, primarily in the P3 segments.  Anterior circulation: Both ICA siphons are patent. Left siphon mild ectasia and moderate calcified plaque. Only mild left siphon stenosis results. Similar right siphon ectasia, moderate calcified plaque with only mild right siphon stenosis. Patent  carotid termini. Normal MCA and ACA origins. Diminutive or absent anterior communicating artery. Bilateral ACA branches are within normal limits. Right MCA M1 segment bifurcates early without stenosis. Right MCA branches are within normal limits. Left MCA M1 segment and bifurcation are patent without stenosis. Areas of questionable left MCA hyperdensity on plain CT remain patent and enhancing. Left MCA branches are within normal limits.  Venous sinuses: Early contrast timing, grossly patent.  Anatomic variants: Left vertebral artery arises directly from the aortic arch and is mildly non dominant.  Review of the MIP images confirms the above findings  IMPRESSION: 1. Negative CT Perfusion. CTA is negative for large vessel occlusion.  2. Atherosclerosis in the head and neck, including a bulky 2 cm area of adherent soft plaque or thrombus in the distal Aortic Arch. Bilateral carotid bifurcation and bilateral ICA siphon calcified atherosclerosis but no hemodynamically significant arterial stenosis identified. Aortic Atherosclerosis (ICD10-I70.0).  Electronically Signed   By: VEAR Hurst M.D.   On: 06/28/2024 04:59 CT HEAD CODE STROKE WO CONTRAST CLINICAL DATA:  Code stroke.  73 year old male.  Right side deficit.  EXAM: CT HEAD WITHOUT CONTRAST  TECHNIQUE: Contiguous axial images were obtained from the base of the skull through the vertex without intravenous contrast.  RADIATION DOSE REDUCTION: This exam was performed according to the departmental dose-optimization program which includes automated exposure control, adjustment of the mA and/or kV according to patient size and/or use of iterative reconstruction technique.  COMPARISON:  Brain MRI 12/26/2005.  FINDINGS: Brain: Cerebral volume is within normal limits for age. No midline shift, ventriculomegaly, mass effect, evidence of mass  lesion, intracranial hemorrhage or evidence of cortically based acute infarction. Patchy  and confluent bilateral cerebral white matter hypodensity, progressed from MRI findings in 2007. Punctate right basal ganglia vascular calcification. Heterogeneity in the deep gray nuclei, most notably the left thalamus. No cortical encephalomalacia identified.  Vascular: Calcified atherosclerosis at the skull base. Questionable abnormal hyperdensity of the left MCA sagittal images 43-46.  Skull: Mild motion artifact. Intact. No acute osseous abnormality identified.  Sinuses/Orbits: Visualized paranasal sinuses and mastoids are clear.  Other: No gaze deviation. Postoperative changes to the right globe. Visualized scalp soft tissues are within normal limits.  ASPECTS [facility] Stroke Program Early CT Score)  Total score (0-10 with 10 being normal): 10  IMPRESSION: 1. Suspicious asymmetric hyperdensity of the left MCA, but no cortically based acute infarct or intracranial hemorrhage identified. ASPECTS 10. 2. Advanced changes of small vessel disease in the cerebral white matter, and deep gray matter heterogeneity including in the left thalamus. 3. These results were communicated to Dr. Michaela at 4:48 am on 06/28/2024 by text page via the [facility] messaging system.  Electronically Signed   By: VEAR Hurst M.D.   On: 06/28/2024 04:50   EKG: not obtained in ED  ASSESSMENT & PLAN:   Assessment & Plan by Problem: Principal Problem:   Acute encephalopathy Active Problems:   Essential hypertension, benign   Hearing loss   Chronic pain syndrome   Paroxysmal atrial fibrillation ([name])   Systemic lupus erythematosus ([name])   David Weaver is a 73 y.o. person living with a history of A-fib on Xarelto , hypertension, cutaneous lupus, T2DM, DDD on chronic opioid use, depression who presented with AMS and admitted for acute encephalopathy c/f sepsis in setting of UTI on hospital day 0  #Acute encephalopathy  #Sepsis rule out  #UTI Presents with AMS overnight.  ANO x 2 to self and  place.  Last well-known was about 9:30 PM 7/28 per wife after dinner and going to bed.  No acute concerns on 7/28 and was feeling well at the time of him going to bed. Patient had reported dysuria. Will treat UTI if this may be contributing to his AMS. EEG ordered given concern of seizure based on wife's report of him standing at sink overnight confused. Neurology still following but less suspicious of TIA/CVA at this time. Electrolytes stable, normal renal and hepatic function. Noted leukocytosis of 13.5 and reported temp of 100.4. UA suggestive of UTI given associated dysuria. CXR question PNA but no acute respiratory symptoms, s/p abx in ED already. No recent changes in medications in past 1-2 weeks.  - Monitor mentation status - Trend fever curve and leukocytosis - Follow-up blood cultures and urine culture - Follow-up RVP - Follow-up UDS - Continue CTX  - Appreciate neurology recs - Follow-up EEG - PT/OT eval and treat  #Lactic acidosis  2.2 on admission. Suspect in setting of sepsis from UTI. S/p NS bolus in ED.  - Trend lactic acid - Continue LR 100 ml/hr   #pAfib on Xarelto   In NSR on exam.  PTA on metoprolol  and Xarelto . - Continue home Xarelto  20 mg and home metoprolol  50 mg twice daily  #SLE Follows with dermatology.  PTA on mycophenolate 1000 mg twice daily and hydroxychloroquine  200 mg daily. Unclear if still taking prednisone  20 mg, does have recent dispense on 7/8 by dermatology.  - Hold mycophenolate and prednisone  in setting of infection  - Continue home hydroxychloroquine    #HTN PTA on amlodipine  5 mg, losartan  100 mg  daily.  BP mildly elevated today. Gradually restart BP meds.   #T2DM A1c 7.0 in June 2025.  PTA on metformin. CBG 140s on admission.   #DDD on chronic opioid use  Chronic/stable.  PTA on Percocet 7.5-325 mg every 6 hours as needed.  No acute pain at this time.  - Hold Percocet at this time but can restart for chronic pain  mgmt  #Depression #Insomnia PTA on Wellbutrin  150 mg daily, duloxetine  60 mg daily, mirtazapine  30 mg nightly.   - Continue home Wellbutrin   - Hold mirtazapine  and duloxetine  at this time given AMS but likely will restart.    #CAD s/p PCI #HLD Chronic/stable. Continue home atorvastatin  40 mg, Zetia  10 mg and colestipol  1 g BID.  Diet: HH/CM VTE ppx: DOAC IVF: LR,100cc/hr Abx: CTX  Code Status: Full Surrogate Decision Maker: Nathanel (relationship: wife)  Prior to Admission Living Arrangement: Home, living with wife Anticipated Discharge Location: Pending Barriers to Discharge: encephalopathy  Dispo: Admit patient to Observation with expected length of stay less than 2 midnights.  Signed: Elicia Sharper, DO Internal Medicine Resident PGY-3 06/28/2024, 10:05 AM   Please contact IM Residency On-Call Pager at: [phone] or [phone].

## 2024-06-28 NOTE — ED Notes (Signed)
 Pt found to be on the edge of the bed with monitoring equipment coming off. This RN assisted patient back to bed. Fall precautions and bed alarm in place. Bed sheets changed, warm blanket given.

## 2024-06-29 DIAGNOSIS — N390 Urinary tract infection, site not specified: Secondary | ICD-10-CM

## 2024-06-29 DIAGNOSIS — G9340 Encephalopathy, unspecified: Secondary | ICD-10-CM | POA: Diagnosis not present

## 2024-06-29 DIAGNOSIS — E7800 Pure hypercholesterolemia, unspecified: Secondary | ICD-10-CM

## 2024-06-29 DIAGNOSIS — I7090 Unspecified atherosclerosis: Secondary | ICD-10-CM | POA: Diagnosis not present

## 2024-06-29 LAB — CBC
HCT: 40.9 % (ref 39.0–52.0)
Hemoglobin: 13.2 g/dL (ref 13.0–17.0)
MCH: 27.7 pg (ref 26.0–34.0)
MCHC: 32.3 g/dL (ref 30.0–36.0)
MCV: 85.9 fL (ref 80.0–100.0)
Platelets: 174 K/uL (ref 150–400)
RBC: 4.76 MIL/uL (ref 4.22–5.81)
RDW: 15.8 % — ABNORMAL HIGH (ref 11.5–15.5)
WBC: 13 K/uL — ABNORMAL HIGH (ref 4.0–10.5)
nRBC: 0 % (ref 0.0–0.2)

## 2024-06-29 LAB — BASIC METABOLIC PANEL WITH GFR
Anion gap: 11 (ref 5–15)
BUN: 6 mg/dL — ABNORMAL LOW (ref 8–23)
CO2: 24 mmol/L (ref 22–32)
Calcium: 8.8 mg/dL — ABNORMAL LOW (ref 8.9–10.3)
Chloride: 102 mmol/L (ref 98–111)
Creatinine, Ser: 0.59 mg/dL — ABNORMAL LOW (ref 0.61–1.24)
GFR, Estimated: >60 mL/min (ref >60)
Glucose, Bld: 159 mg/dL — ABNORMAL HIGH (ref 70–99)
Potassium: 3.8 mmol/L (ref 3.5–5.1)
Sodium: 137 mmol/L (ref 135–145)

## 2024-06-29 LAB — GLUCOSE, CAPILLARY
Glucose-Capillary: 139 mg/dL — ABNORMAL HIGH (ref 70–99)
Glucose-Capillary: 144 mg/dL — ABNORMAL HIGH (ref 70–99)
Glucose-Capillary: 142 mg/dL — ABNORMAL HIGH (ref 70–99)

## 2024-06-29 MED ORDER — AMLODIPINE BESYLATE 5 MG PO TABS
5.0000 mg | ORAL_TABLET | Freq: Every day | ORAL | Status: DC
  Administered 2024-06-29 – 2024-06-30 (×2): 5 mg via ORAL
  Filled 2024-06-29 (×2): qty 1

## 2024-06-29 MED ORDER — LOSARTAN POTASSIUM 50 MG PO TABS
100.0000 mg | ORAL_TABLET | Freq: Every day | ORAL | Status: DC
  Administered 2024-06-29 – 2024-06-30 (×2): 100 mg via ORAL
  Filled 2024-06-29 (×2): qty 2

## 2024-06-29 NOTE — Progress Notes (Addendum)
 NEUROLOGY CONSULT FOLLOW UP NOTE   Date of service: June 29, 2024 Patient Name: David Weaver MRN:  [id number] DOB:  Sep 15, 1951  Interval Hx/subjective   Saw patient at bedside. Had just finished breakfast. He does not remember why he came to the hospital but tells me he feels great today. Has been ambulation to and from the bathroom, endorses good appetite, and has been watching TV. Denies chest pain, shortness of breath. Has suprapubic tenderness. We engaged in conversation for about 5 minutes.   Vitals   Vitals:   06/28/24 2135 06/29/24 0006 06/29/24 0036 06/29/24 0401  BP: (!) 149/81 (!) 173/82 (!) 146/76 (!) 144/76  Pulse: 90 71 72 81  Resp:  20  18  Temp:  98 F (36.7 C)  (!) 97.5 F (36.4 C)  TempSrc:  Oral    SpO2:  99% 94% 98%  Weight:      Height:         Body mass index is 32.66 kg/m.  Physical Exam   Constitutional: Appears well-developed and well-nourished elderly man laying in bed Psych: Affect appropriate to situation.  Eyes: No scleral injection. MMM HENT: No OP obstrucion. Hearing aides in place Head: Normocephalic. atraumatic Cardiovascular: Normal rate and regular rhythm.  Respiratory: Effort normal, non-labored breathing. CTAB anterolaterally GI: Soft.  No distension. Suprapubic tenderness Skin: WDI.   Neurologic Examination   Neurologic exam: Mental status: A&O to self, place, month and year. Normal attention Cranial Nerves: Full extraocular movements, symmetric face. Able to understand spoken speech with hearing aides. Motor: Strength 5/5 on all upper and lower extremities, Gait: Deferred Sensory: Light touch intact and symmetric bilaterally  Hand to eye coordination with high five present, with mild delay Psychiatric: Normal mood and affect   Medications  Current Facility-Administered Medications:    acetaminophen  (TYLENOL ) tablet 650 mg, 650 mg, Oral, Q6H PRN **OR** acetaminophen  (TYLENOL ) suppository 650 mg, 650 mg, Rectal, Q6H PRN,  Zheng, Michael, DO   amLODipine  (NORVASC ) tablet 5 mg, 5 mg, Oral, Daily, Lyles, Elliott, DO, 5 mg at 06/29/24 9170   atorvastatin  (LIPITOR) tablet 40 mg, 40 mg, Oral, Daily, Zheng, Michael, DO, 40 mg at 06/29/24 0830   buPROPion  (WELLBUTRIN  XL) 24 hr tablet 150 mg, 150 mg, Oral, Daily, Zheng, Michael, DO, 150 mg at 06/29/24 9170   cefTRIAXone  (ROCEPHIN ) 2 g in sodium chloride  0.9 % 100 mL IVPB, 2 g, Intravenous, Q24H, Pham, Minh Q, RPH-CPP, Last Rate: 200 mL/hr at 06/29/24 0834, 2 g at 06/29/24 9165   colestipol  (COLESTID ) tablet 1 g, 1 g, Oral, BID, Zheng, Michael, DO, 1 g at 06/29/24 0830   ezetimibe  (ZETIA ) tablet 10 mg, 10 mg, Oral, Daily, Zheng, Michael, DO, 10 mg at 06/29/24 9170   famotidine  (PEPCID ) tablet 20 mg, 20 mg, Oral, Daily, Zheng, Michael, DO, 20 mg at 06/29/24 9170   hydroxychloroquine  (PLAQUENIL ) tablet 200 mg, 200 mg, Oral, BID, Zheng, Michael, DO, 200 mg at 06/29/24 9170   losartan  (COZAAR ) tablet 100 mg, 100 mg, Oral, Daily, Lyles, Elliott, DO, 100 mg at 06/29/24 9170   metoprolol  tartrate (LOPRESSOR ) tablet 50 mg, 50 mg, Oral, BID, Zheng, Michael, DO, 50 mg at 06/29/24 9170   mirtazapine  (REMERON ) tablet 30 mg, 30 mg, Oral, QHS, Tan, Dawson, MD, 30 mg at 06/28/24 2135   oxyCODONE -acetaminophen  (PERCOCET) 7.5-325 MG per tablet 1 tablet, 1 tablet, Oral, Q6H PRN, Tan, Dawson, MD, 1 tablet at 06/29/24 0428   pantoprazole  (PROTONIX ) EC tablet 40 mg, 40 mg, Oral, Daily, Zheng,  Ozell, DO, 40 mg at 06/29/24 9170   rivaroxaban  (XARELTO ) tablet 20 mg, 20 mg, Oral, Q supper, Zheng, Michael, DO, 20 mg at 06/28/24 1834   senna-docusate (Senokot-S) tablet 1 tablet, 1 tablet, Oral, QHS PRN, Zheng, Michael, DO  Labs and Diagnostic Imaging   CBC:  Recent Labs  Lab 06/28/24 0446 06/29/24 0353  WBC 13.6* 13.0*  NEUTROABS 11.4*  --   HGB 12.4* 13.2  HCT 38.8* 40.9  MCV 87.2 85.9  PLT 170 174    Basic Metabolic Panel:  Lab Results  Component Value Date   NA 137 06/29/2024   K  3.8 06/29/2024   CO2 24 06/29/2024   GLUCOSE 159 (H) 06/29/2024   BUN 6 (L) 06/29/2024   CREATININE 0.59 (L) 06/29/2024   CALCIUM  8.8 (L) 06/29/2024   GFRNONAA >60 06/29/2024   GFRAA 116 01/08/2021   Lipid Panel:  Lab Results  Component Value Date   LDLCALC 33 02/17/2023   HgbA1c:  Lab Results  Component Value Date   HGBA1C 6.4 (H) 10/28/2022   Urine Drug Screen: No results found for: LABOPIA, COCAINSCRNUR, LABBENZ, AMPHETMU, THCU, LABBARB  Alcohol  Level     Component Value Date/Time   ETH <15 06/28/2024 0446   INR  Lab Results  Component Value Date   INR 1.7 (H) 06/28/2024   APTT  Lab Results  Component Value Date   APTT 30 06/28/2024   AED levels: No results found for: PHENYTOIN, ZONISAMIDE, LAMOTRIGINE, LEVETIRACETA  CT Head without contrast(Personally reviewed): Suspicious asymmetric hyperdensity of the left MCA, but no cortically based acute infarct or intracranial hemorrhage identified. Advanced changes of small vessel disease in the cerebral white matter, and deep gray matter heterogeneity including in the left thalamus.  CT angio Head and Neck with contrast(Personally reviewed): Negative CT Perfusion. CTA is negative for large vessel occlusion. Atherosclerosis in the head and neck, including a bulky 2 cm area of adherent soft plaque or thrombus in the distal Aortic Arch. Bilateral carotid bifurcation and bilateral ICA siphon calcified atherosclerosis but no hemodynamically significant arterial stenosis identified.  rEEG:  7/29 Routine EEG with mild to moderate diffuse encephalopathy, nonspecific etiology but likely related to sedation, toxic-metabolic etiology. No seizures or epileptiform discharges were seen throughout the recording.  Assessment   David Weaver is a 73 y.o. male with a history of systemic lupus, hypercholesterolemia, CAD, A-fib on chronic anticoagulation admitted for acute encephalopathy and stroke rule out on 7/29.   Imaging on admission did not reveal acute intracranial process and metabolic workup thus far revealed a UTI, likely the cause of the acute change in mentation.  Per bedside assessment and our exam, patient is back to his baseline mentation.  No new focal neurological deficits.  Main change since admission is the addition of antibiotics to treat his current UTI.   UTI; WBC 13K. Urine culture with >100K GNRs. Awaiting susceptibilities. On ceftriaxone  per primary team. Lactic acid resolved.  Acute encephalopathy: Improved after abx initiation. No epileptiform discharge. Mild to moderate diffuse encephalopathy, likely in the setting of infectious  Hypercholesterolemia: with evidence of large vessel atherosclerosis. Continue Lipitor 40 mg and Zetia  10 mg daily. Other active problems per primary team Recommendations  - Continue UTI management per primary team - Will hold off on MRI since patient is back to his baseline mentation - Continue Lipitor and Zetia  - Neuro will sign off  ______________________________________________________________________   Signed, Hadassah Ala, MD [facility] resident [facility]  NEUROHOSPITALIST ADDENDUM Performed a  face to face diagnostic evaluation.   I have reviewed the contents of history and physical exam as documented by PA/ARNP/Resident and agree with above documentation.  I have discussed and formulated the above plan as documented. Edits to the note have been made as needed.   Yeriel Mineo, MD [facility] [phone]   If 7pm to 7am, please call on call as listed on AMION.

## 2024-06-29 NOTE — Progress Notes (Signed)
 HD#0 SUBJECTIVE:  Patient Summary: David David is a 73 y.o. with a pertinent PMH of paroxysmal atrial fibrillation, HTN, HLD, CAD s/p PCI, SLE, T2DM, DDD, and depression who presented with weakness and altered mentation and admitted for sepsis.   Overnight Events: restarted home percocet and mirtazapine    Interim History: Patient feels more alert than he did when he first arrived to the ED. His wife went home last night and had work this morning. He didn't sleep well because the bed is uncomfortable and he couldn't figure out how to pee in the urinal so he wet the bed.  Denies dysuria and hasn't had burning with urination since before he came to the ED. He gets short of breath if he walks for a long time but denies shortness of breath at rest. Some right sided back pain with deep breaths.   Later in the afternoon, I went and spoke with him and his wife. She had returned from work. She says that his mentation is much better than it was when she was at home and made the decision to call the ED. She mentioned that he's been having trouble urinating and that the odor has been bad for a few weeks and remembers that a doctor in the past told patient he had an enlarged prostate. She's wondering if this could have caused his UTI. He hasn't had a UTI in many years. Wife says he has a headache and asks for pain medication.    OBJECTIVE:  Vital Signs: Vitals:   06/28/24 2135 06/29/24 0006 06/29/24 0036 06/29/24 0401  BP: (!) 149/81 (!) 173/82 (!) 146/76 (!) 144/76  Pulse: 90 71 72 81  Resp:  20  18  Temp:  98 F (36.7 C)  (!) 97.5 F (36.4 C)  TempSrc:  Oral    SpO2:  99% 94% 98%  Weight:      Height:       Supplemental O2: Room Air SpO2: 98 % O2 Flow Rate (L/min): 4 L/min  Filed Weights   06/28/24 0506  Weight: 94.6 kg     Intake/Output Summary (Last 24 hours) at 06/29/2024 0615 Last data filed at 06/28/2024 2135 Gross per 24 hour  Intake 2583.15 ml  Output 600 ml  Net 1983.15 ml    Net IO Since Admission: 1,983.15 mL [06/29/24 0615]  Physical Exam: Physical Exam Constitutional:      Appearance: Normal appearance. He is not ill-appearing or diaphoretic.  HENT:     Head: Normocephalic and atraumatic.     Nose: No congestion or rhinorrhea.     Mouth/Throat:     Mouth: Mucous membranes are moist.  Eyes:     Extraocular Movements: Extraocular movements intact.     Conjunctiva/sclera: Conjunctivae normal.  Cardiovascular:     Rate and Rhythm: Normal rate and regular rhythm.  Pulmonary:     Effort: Pulmonary effort is normal.     Breath sounds: Normal breath sounds.  Skin:    General: Skin is warm and dry.  Neurological:     General: No focal deficit present.     Mental Status: He is alert and oriented to person, place, and time.  Psychiatric:        Mood and Affect: Mood normal.     Patient Lines/Drains/Airways Status     Active Line/Drains/Airways     Name Placement date Placement time Site Days   Peripheral IV 06/28/24 18 G Left Antecubital 06/28/24  --  Antecubital  1  Closed System Drain 2 Right Knee Accordion (Hemovac) 10 Fr. 12/27/12  1235  Knee  4202            Pertinent labs and imaging:      Latest Ref Rng & Units 06/29/2024    3:53 AM 06/28/2024    4:46 AM 06/28/2024    4:40 AM  CBC  WBC 4.0 - 10.5 K/uL 13.0  13.6    Hemoglobin 13.0 - 17.0 g/dL 86.7  87.5  87.0   Hematocrit 39.0 - 52.0 % 40.9  38.8  38.0   Platelets 150 - 400 K/uL 174  170         Latest Ref Rng & Units 06/29/2024    3:53 AM 06/28/2024    4:46 AM 06/28/2024    4:40 AM  CMP  Glucose 70 - 99 mg/dL 840  854  851   BUN 8 - 23 mg/dL 6  14  14    Creatinine 0.61 - 1.24 mg/dL 9.40  9.24  9.29   Sodium 135 - 145 mmol/L 137  137  136   Potassium 3.5 - 5.1 mmol/L 3.8  3.7  3.7   Chloride 98 - 111 mmol/L 102  103  102   CO2 22 - 32 mmol/L 24  21    Calcium  8.9 - 10.3 mg/dL 8.8  8.4    Total Protein 6.5 - 8.1 g/dL  5.9    Total Bilirubin 0.0 - 1.2 mg/dL  0.7     Alkaline Phos 38 - 126 U/L  45    AST 15 - 41 U/L  19    ALT 0 - 44 U/L  23      EEG adult Result Date: 06/28/2024 David Lek, MD     06/28/2024 12:11 PM Patient Name: David David MRN: [id number] Epilepsy Attending: Lek David Referring Physician/Provider: Date: 06/28/2024 Duration:  27 minutes Patient history: 73 y.o. male with altered mental status of unclear etiology.  EEG to rule out seizure Level of alertness: Awake, drowsy, AEDs during EEG study: Technical aspects: This EEG study was done with scalp electrodes positioned according to the 10-20 International system of electrode placement. Electrical activity was reviewed with band pass filter of 1-70Hz , sensitivity of 7 uV/mm, display speed of 47mm/sec with a 60Hz  notched filter applied as appropriate. EEG data were recorded continuously and digitally stored.  Video monitoring was available and reviewed as appropriate. Description: The posterior dominant rhythm consists of 3-7 Hz activity of moderate voltage (25-35 uV) seen predominantly in posterior head regions, symmetric and reactive to eye opening and eye closing. Drowsiness was characterized by attenuation of the posterior background rhythm. Sleep was not seen. EEG showed continuous generalized polymorphic sharply contoured 3 to 6 Hz theta-delta slowing. Physiologic photic driving was not seen during photic stimulation.  Hyperventilation was not performed.   ABNORMALITY - Continuous slow, generalized IMPRESSION: This study is suggestive of mild to moderate diffuse encephalopathy, nonspecific etiology but likely related to sedation, toxic-metabolic etiology. No seizures or epileptiform discharges were seen throughout the recording. David Gregg MD Neurology     ASSESSMENT/PLAN:  Assessment: Principal Problem:   Acute encephalopathy Active Problems:   Essential hypertension, benign   Hearing loss   Chronic pain syndrome   Paroxysmal atrial fibrillation ([name])   Systemic lupus  erythematosus ([name])   Acute cystitis without hematuria   Type 2 diabetes mellitus with other specified complication ([name])   Plan: #Acute encephalopathy  #Sepsis rule out  #UTI -Presents 7/29 AM with  AMS overnight, ANO x 2 to self and place.  Last well-known was about 9:30 PM 7/28 per wife after dinner and going to bed.  No acute concerns on 7/28 and was feeling well at the time of him going to bed. Patient had reported dysuria. EEG ordered given concern of seizure based on wife's report of him standing at sink overnight confused. EEG on 7/29 suggestive of mild to mod diffuse encephalopathy likely assoc with sedation, toxic-metabolic etiology. Neg for seizures.  -On admission 7/29 AM: Electrolytes stable, normal renal and hepatic function. Noted leukocytosis of 13.5 and reported temp of 100.4. UA suggestive of UTI given associated dysuria. CXR question PNA but no acute respiratory symptoms, s/p abx in ED already. No recent changes in medications in past 1-2 weeks.  - Alert to person, place, and time today.  - Trend fever curve and leukocytosis: 97.21F, 13 WBC - 7/29 blood cultures (Ng1d) and urine culture (e.coli sensitivities pending) pending - 7/29 RVP negative - 7/29 UDS mod WBCs, positive nitrites, many bacteria - Continue CTX, can transition to cefuroxime  PO when discharged  - PT/OT eval and treat   #Lactic acidosis  2.2 on admission. Suspect in setting of sepsis from UTI. S/p NS bolus in ED.  - Lactic acid repeat 3 hours later 1.4 wnl, will stop trending   #pAfib on Xarelto   NSR on admission - Continue home Xarelto  20 mg and home metoprolol  50 mg twice daily   #SLE Follows with dermatology.  PTA on mycophenolate 1000 mg twice daily and hydroxychloroquine  200 mg daily.  - Hold mycophenolate and prednisone  in setting of infection. Prednisone  temporarily started 7/8 by derm for SLE flare, this is not a regular at home medication.  - Continue home hydroxychloroquine     #HTN PTA on  amlodipine  5 mg, losartan  100 mg daily. -BP has been mildly elevated since admission, 144/76 this AM.  -Resumed both 7/30 AM   #T2DM A1c 7.0 in June 2025.  PTA on metformin. CBG 140s on admission. Recent prednisone  use.    #DDD on chronic opioid use  Chronic/stable.  PTA on Percocet 7.5-325 mg every 6 hours as needed.  - Percocet held on admission but restarted overnight for pain   #Depression #Insomnia PTA on Wellbutrin  150 mg daily, duloxetine  60 mg daily, mirtazapine  30 mg nightly.   - Continue home Wellbutrin  and mirtazapine   - Hold duloxetine  at this time given AMS but likely will restart.     #CAD s/p PCI #HLD Chronic/stable. Continue home atorvastatin  40 mg, Zetia  10 mg and colestipol  1 g BID.  Best Practice: Diet: heart healthy, cardiac diet VTE ppx: DOAC Code: Full  Disposition planning: Therapy Recs: None, DME: none Family Contact: Wife, at bedside. DISPO: Anticipated discharge tomorrow to Home pending cultures and maintenance of temperature and mentation.   Signature:  Viktoria Charmayne [facility]  6:15 AM, 06/29/2024  On Call pager [phone]

## 2024-06-29 NOTE — Care Management Obs Status (Signed)
 MEDICARE OBSERVATION STATUS NOTIFICATION   Patient Details  Name: David Weaver MRN: [id number] Date of Birth: [birth year]   Medicare Observation Status Notification Given:  Yes  Ob letter signed and copy given.   Farzad Tibbetts 06/29/2024, 12:18 PM

## 2024-06-29 NOTE — Evaluation (Addendum)
 Physical Therapy Evaluation Patient Details Name: David Weaver MRN: [id number] DOB: 1951/03/26 Today's Date: 06/29/2024  History of Present Illness  73 y.o. male presents to [facility]/28/24 with AMS, urinary frequency and difficulty ambulating. Code stroke called, canceled by neurology. Admitted with acute encephalopathy 2/2 sepsis in setting of UTI. PMHx: HTN, T2DM, SLE, CAD s/p LAD PCI, P A-fib on Xarelto , DDD on chronic opioid use   Clinical Impression  Hand-off from OT with pt ambulating in the hallway. PTA, pt was independent for mobility with no AD in the home and SP cane in the community. Pt reported being at mobility baseline with ModI to stand and supervision to ambulate 240ft with no AD. Pt scored a 20/24 on the DGI indicating that he is not at an increased risk of falling. Reported typically having a slower gait speed at baseline. Pt has intermittent level of assist available at home. Pt has no further acute PT needs with recommendation for HHPT for home safety evaluation. Please re-consult if there are any changes in status.          If plan is discharge home, recommend the following: Assist for transportation;Help with stairs or ramp for entrance   Can travel by private vehicle    Yes    Equipment Recommendations None recommended by PT     Functional Status Assessment Patient has had a recent decline in their functional status and demonstrates the ability to make significant improvements in function in a reasonable and predictable amount of time.     Precautions / Restrictions Precautions Precautions: Fall Restrictions Weight Bearing Restrictions Per Provider Order: No      Mobility  Bed Mobility  General bed mobility comments: not assessed    Transfers Overall transfer level: Modified independent Equipment used: None     Ambulation/Gait Ambulation/Gait assistance: Supervision Gait Distance (Feet): 250 Feet Assistive device: None Gait Pattern/deviations:  Step-through pattern, Decreased stride length Gait velocity: decr     General Gait Details: steady gait with increased medial/lateral sway. No overt LOB noted  Stairs Stairs: Yes Stairs assistance: Supervision Stair Management: One rail Left, Step to pattern, Forwards Number of Stairs: 2 General stair comments: step to pattern with use of one handrail    Balance Overall balance assessment: Needs assistance, History of Falls Sitting-balance support: No upper extremity supported, Feet supported Sitting balance-Leahy Scale: Good     Standing balance support: No upper extremity supported Standing balance-Leahy Scale: Good    Standardized Balance Assessment Standardized Balance Assessment : Dynamic Gait Index   Dynamic Gait Index Level Surface: Mild Impairment Change in Gait Speed: Mild Impairment Gait with Horizontal Head Turns: Normal Gait with Vertical Head Turns: Normal Gait and Pivot Turn: Normal Step Over Obstacle: Normal Step Around Obstacles: Normal Steps: Moderate Impairment Total Score: 20       Pertinent Vitals/Pain Pain Assessment Pain Assessment: No/denies pain    Home Living Family/patient expects to be discharged to:: Private residence Living Arrangements: Spouse/significant other Available Help at Discharge: Family;Available PRN/intermittently (wife works, fixes hair) Type of Home: House Home Access: Stairs to enter Entrance Stairs-Rails: Can reach both Entrance Stairs-Number of Steps: 5 steps back porch (pt typically takes front entrance, 0 steps)   Home Layout: One level Home Equipment: Grab bars - tub/shower;Rolling Walker (2 wheels);Cane - single point Additional Comments: Reports 1 fall from being tripped up    Prior Function Prior Level of Function : Independent/Modified Independent;Driving    Mobility Comments: ind no AD, mod i SPC in  community. ADLs Comments: ind including medication management     Extremity/Trunk Assessment   Upper  Extremity Assessment Upper Extremity Assessment: Defer to OT evaluation    Lower Extremity Assessment Lower Extremity Assessment: Overall WFL for tasks assessed    Cervical / Trunk Assessment Cervical / Trunk Assessment: Normal  Communication   Communication Communication: Impaired Factors Affecting Communication: Hearing impaired    Cognition Arousal: Alert Behavior During Therapy: WFL for tasks assessed/performed   PT - Cognitive impairments: Orientation   Orientation impairments: Situation    Following commands: Intact       Cueing Cueing Techniques: Verbal cues      PT Assessment Patient does not need any further PT services         PT Goals (Current goals can be found in the Care Plan section)  Acute Rehab PT Goals PT Goal Formulation: All assessment and education complete, DC therapy     AM-PAC PT 6 Clicks Mobility  Outcome Measure Help needed turning from your back to your side while in a flat bed without using bedrails?: None Help needed moving from lying on your back to sitting on the side of a flat bed without using bedrails?: None Help needed moving to and from a bed to a chair (including a wheelchair)?: A Little Help needed standing up from a chair using your arms (e.g., wheelchair or bedside chair)?: None Help needed to walk in hospital room?: A Little Help needed climbing 3-5 steps with a railing? : A Little 6 Click Score: 21    End of Session   Activity Tolerance: Patient tolerated treatment well Patient left: in chair;with call bell/phone within reach;Other (comment) (OT in room) Nurse Communication: Mobility status PT Visit Diagnosis: Other abnormalities of gait and mobility (R26.89)    Time: 9096-9085 PT Time Calculation (min) (ACUTE ONLY): 11 min   Charges:   PT Evaluation $PT Eval Low Complexity: 1 Low   PT General Charges $$ ACUTE PT VISIT: 1 Visit        Kate ORN, PT, DPT Secure Chat Preferred  Rehab Office  [phone]   Kate BRAVO Wendolyn 06/29/2024, 9:30 AM

## 2024-06-29 NOTE — Evaluation (Signed)
 Occupational Therapy Evaluation Patient Details Name: David Weaver MRN: [id number] DOB: 1951/03/04 Today's Date: 06/29/2024   History of Present Illness   73 y.o. male presents to [facility] 06/28/24 with AMS, urinary frequency and difficulty ambulating. Code stroke called, canceled by neurology. Admitted with acute encephalopathy 2/2 sepsis in setting of UTI. PMHx: HTN, T2DM, SLE, CAD s/p LAD PCI, P A-fib on Xarelto , DDD on chronic opioid use     Clinical Impressions Pt admitted for above, PTA pt lived with spouse and reports being ind with mobility and ADLs/iADLS, driving and managing his medications without challenge. Pt ambulating with supervision no AD and completing ADLs with supervision to ind level. Pt demonstrated some cognitive impairments listed below, he scored poorly on the medi-cog assessment, discussed with pt and spouse either having the wife take over for med management or using prepackaged meds. The spouse was content with either choice, messaged MD as well. OT to continue working with pt to progress pt cognition as able. Patient would benefit from post acute Home OT services to help maximize functional independence in natural environment for higher level functional tasks.      If plan is discharge home, recommend the following:   Assistance with cooking/housework;Direct supervision/assist for medications management;Direct supervision/assist for financial management     Functional Status Assessment   Patient has had a recent decline in their functional status and demonstrates the ability to make significant improvements in function in a reasonable and predictable amount of time.     Equipment Recommendations   None recommended by OT     Recommendations for Other Services         Precautions/Restrictions   Precautions Precautions: Fall Restrictions Weight Bearing Restrictions Per Provider Order: No     Mobility Bed Mobility Overal bed mobility: Modified  Independent             General bed mobility comments: inc time    Transfers Overall transfer level: Modified independent Equipment used: None                      Balance Overall balance assessment: Needs assistance, History of Falls Sitting-balance support: No upper extremity supported, Feet supported Sitting balance-Leahy Scale: Good     Standing balance support: No upper extremity supported Standing balance-Leahy Scale: Good                             ADL either performed or assessed with clinical judgement   ADL Overall ADL's : Needs assistance/impaired Eating/Feeding: Independent;Sitting   Grooming: Wash/dry face;Oral care;Standing;Supervision/safety   Upper Body Bathing: Supervision/ safety;Standing Upper Body Bathing Details (indicate cue type and reason): simbulated Lower Body Bathing: Supervison/ safety;Sit to/from stand Lower Body Bathing Details (indicate cue type and reason): simbulated, one UE support to mimic grab bar Upper Body Dressing : Independent;Sitting   Lower Body Dressing: Sit to/from stand;Supervision/safety   Toilet Transfer: Ambulation;Supervision/safety           Functional mobility during ADLs: Supervision/safety       Vision   Vision Assessment?: No apparent visual deficits     Perception         Praxis         Pertinent Vitals/Pain Pain Assessment Pain Assessment: No/denies pain     Extremity/Trunk Assessment Upper Extremity Assessment Upper Extremity Assessment: Overall WFL for tasks assessed   Lower Extremity Assessment Lower Extremity Assessment: Overall WFL for tasks assessed  Cervical / Trunk Assessment Cervical / Trunk Assessment: Normal   Communication Communication Communication: Impaired Factors Affecting Communication: Hearing impaired (hearing aides)   Cognition Arousal: Alert Behavior During Therapy: WFL for tasks assessed/performed Cognition: Cognition impaired    Orientation impairments: Situation (was not sure of reasoning for admit)   Memory impairment (select all impairments): Working Biochemist, clinical functioning impairment (select all impairments): Problem solving OT - Cognition Comments: Pt scored 4/10 on medicog assessment, Pt got 3/3 on word recall section, during his clock draw he added in additional numbers such as 16,17,18. Pt also not able to set time correctly. He was only able to answer 1/5 questions on the medication transfer log                 Following commands: Intact       Cueing  General Comments   Cueing Techniques: Verbal cues  VSS RA, reached out to pt spouse and she is  in agreeance with managing pt medications.   Exercises     Shoulder Instructions      Home Living Family/patient expects to be discharged to:: Private residence Living Arrangements: Spouse/significant other Available Help at Discharge: Family;Available PRN/intermittently (wife works, fixes hair) Type of Home: House Home Access: Stairs to enter [employer] of Steps: 5 steps back porch (pt typically takes front entrance, 0 steps) Entrance Stairs-Rails: Can reach both Home Layout: One level     Bathroom Shower/Tub: Producer, television/film/video: Standard Bathroom Accessibility: Yes   Home Equipment: Grab bars - tub/shower;Rolling Walker (2 wheels);Cane - single point   Additional Comments: Reports 1 fall from being tripped up      Prior Functioning/Environment Prior Level of Function : Independent/Modified Independent;Driving             Mobility Comments: ind no AD, mod i SPC in community. ADLs Comments: ind including medication management    OT Problem List: Decreased cognition   OT Treatment/Interventions: Cognitive remediation/compensation;Balance training;Patient/family education;Therapeutic exercise;Self-care/ADL training;Therapeutic activities      OT Goals(Current goals can be found in the care  plan section)   Acute Rehab OT Goals Patient Stated Goal: To go home OT Goal Formulation: With patient Time For Goal Achievement: 07/14/24 Potential to Achieve Goals: Good ADL Goals Additional ADL Goal #1: Pt will complete 3 step iADL without any verbal cues to sequence Additional ADL Goal #2: Pt will demonstrate understanding of at least 2 compensatory strategies for medication management   OT Frequency:  Min 2X/week    Co-evaluation              AM-PAC OT 6 Clicks Daily Activity     Outcome Measure Help from another person eating meals?: None Help from another person taking care of personal grooming?: A Little Help from another person toileting, which includes using toliet, bedpan, or urinal?: A Little Help from another person bathing (including washing, rinsing, drying)?: A Little Help from another person to put on and taking off regular upper body clothing?: None Help from another person to put on and taking off regular lower body clothing?: A Little 6 Click Score: 20   End of Session Equipment Utilized During Treatment: Gait belt Nurse Communication: Mobility status  Activity Tolerance: Patient tolerated treatment well Patient left: in chair;with call bell/phone within reach  OT Visit Diagnosis: Other symptoms and signs involving cognitive function                Time: 9152-9096 OT Time Calculation (min):  16 min Charges:  OT General Charges $OT Visit: 1 Visit OT Evaluation $OT Eval Low Complexity: 1 Low  06/29/2024  AB, OTR/L  Acute Rehabilitation Services  Office: [phone]   Curtistine JONETTA Das 06/29/2024, 11:26 AM

## 2024-06-29 NOTE — Plan of Care (Signed)
  Problem: Education: Goal: Knowledge of General Education information will improve Description: Including pain rating scale, medication(s)/side effects and non-pharmacologic comfort measures 06/29/2024 0330 by Lynita Jalaine Leonie JAYSON, RN Outcome: Progressing 06/29/2024 0330 by Lynita Jalaine Leonie JAYSON, RN Outcome: Progressing   Problem: Health Behavior/Discharge Planning: Goal: Ability to manage health-related needs will improve 06/29/2024 0330 by Lynita Jalaine Leonie JAYSON, RN Outcome: Progressing 06/29/2024 0330 by Lynita Jalaine Leonie JAYSON, RN Outcome: Progressing   Problem: Clinical Measurements: Goal: Ability to maintain clinical measurements within normal limits will improve 06/29/2024 0330 by Lynita Jalaine Leonie JAYSON, RN Outcome: Progressing 06/29/2024 0330 by Lynita Jalaine Leonie JAYSON, RN Outcome: Progressing Goal: Will remain free from infection 06/29/2024 0330 by Lynita Jalaine Leonie JAYSON, RN Outcome: Progressing 06/29/2024 0330 by Lynita Jalaine Leonie JAYSON, RN Outcome: Progressing Goal: Diagnostic test results will improve 06/29/2024 0330 by Lynita Jalaine Leonie JAYSON, RN Outcome: Progressing 06/29/2024 0330 by Lynita Jalaine Leonie JAYSON, RN Outcome: Progressing Goal: Respiratory complications will improve 06/29/2024 0330 by Lynita Jalaine Leonie JAYSON, RN Outcome: Progressing 06/29/2024 0330 by Lynita Jalaine Leonie JAYSON, RN Outcome: Progressing Goal: Cardiovascular complication will be avoided 06/29/2024 0330 by Lynita Jalaine Leonie JAYSON, RN Outcome: Progressing 06/29/2024 0330 by Lynita Jalaine Leonie JAYSON, RN Outcome: Progressing   Problem: Activity: Goal: Risk for activity intolerance will decrease 06/29/2024 0330 by Lynita Jalaine Leonie JAYSON, RN Outcome: Progressing 06/29/2024 0330 by Lynita Jalaine Leonie JAYSON, RN Outcome: Progressing   Problem: Nutrition: Goal: Adequate nutrition will be maintained 06/29/2024 0330 by Lynita Jalaine Leonie JAYSON, RN Outcome:  Progressing 06/29/2024 0330 by Lynita Jalaine Leonie JAYSON, RN Outcome: Progressing   Problem: Coping: Goal: Level of anxiety will decrease 06/29/2024 0330 by Lynita Jalaine Leonie JAYSON, RN Outcome: Progressing 06/29/2024 0330 by Lynita Jalaine Leonie JAYSON, RN Outcome: Progressing   Problem: Elimination: Goal: Will not experience complications related to bowel motility 06/29/2024 0330 by Lynita Jalaine Leonie JAYSON, RN Outcome: Progressing 06/29/2024 0330 by Lynita Jalaine Leonie JAYSON, RN Outcome: Progressing Goal: Will not experience complications related to urinary retention 06/29/2024 0330 by Lynita Jalaine Leonie JAYSON, RN Outcome: Progressing 06/29/2024 0330 by Lynita Jalaine Leonie JAYSON, RN Outcome: Progressing   Problem: Pain Managment: Goal: General experience of comfort will improve and/or be controlled 06/29/2024 0330 by Lynita Jalaine Leonie JAYSON, RN Outcome: Progressing 06/29/2024 0330 by Lynita Jalaine Leonie JAYSON, RN Outcome: Progressing   Problem: Safety: Goal: Ability to remain free from injury will improve 06/29/2024 0330 by Lynita Jalaine Leonie JAYSON, RN Outcome: Progressing 06/29/2024 0330 by Lynita Jalaine Leonie JAYSON, RN Outcome: Progressing   Problem: Skin Integrity: Goal: Risk for impaired skin integrity will decrease 06/29/2024 0330 by Lynita Jalaine Leonie JAYSON, RN Outcome: Progressing 06/29/2024 0330 by Lynita Jalaine Leonie JAYSON, RN Outcome: Progressing   Problem: Urinary Elimination: Goal: Signs and symptoms of infection will decrease Outcome: Progressing

## 2024-06-29 NOTE — TOC Initial Note (Signed)
 Transition of Care [facility]) - Initial/Assessment Note    Patient Details  Name: David Weaver MRN: [id number] Date of Birth: 06-14-1951  [facility] CM/SW Contact:    Andrez JULIANNA George, RN Phone Number: 06/29/2024, 2:06 PM  Clinical Narrative:                  Pt is from home with his spouse. They are together most of the time.  No DME at home.  Spouse provides most of his needed transportation.  Spouse is willing to over see his medications at home until she feels his orientation is better.  Home health arranged with [facility] after providing choice. Information on the AVS. Hedda will contact the patient for the first home visit. Wife will transport home when medically ready.   Expected Discharge Plan: Home w Home Health Services Barriers to Discharge: Continued Medical Work up   Patient Goals and CMS Choice   CMS Medicare.gov Compare Post Acute Care list provided to:: Patient Represenative (must comment) Choice offered to / list presented to : Spouse      Expected Discharge Plan and Services   Discharge Planning Services: CM Consult Post Acute Care Choice: Home Health Living arrangements for the past 2 months: Single Family Home                           HH Arranged: PT, OT HH Agency: [facility] Date [facility] Agency Contacted: 06/29/24   Representative spoke with at [facility] Agency: Darleene  Prior Living Arrangements/Services Living arrangements for the past 2 months: Single Family Home Lives with:: Spouse Patient language and need for interpreter reviewed:: Yes Do you feel safe going back to the place where you live?: Yes        Care giver support system in place?: Yes (comment)   Criminal Activity/Legal Involvement Pertinent to Current Situation/Hospitalization: No - Comment as needed  Activities of Daily Living   ADL Screening (condition at time of admission) Independently performs ADLs?: Yes (appropriate for developmental age) Is the patient deaf  or have difficulty hearing?: Yes Does the patient have difficulty seeing, even when wearing glasses/contacts?: No Does the patient have difficulty concentrating, remembering, or making decisions?: No  Permission Sought/Granted                  Emotional Assessment Appearance:: Appears stated age   Affect (typically observed): Quiet Orientation: : Oriented to Self, Oriented to Place, Oriented to  Time, Oriented to Situation   Psych Involvement: No (comment)  Admission diagnosis:  Acute cystitis without hematuria [N30.00] Acute encephalopathy [G93.40] Sepsis, due to unspecified organism, unspecified whether acute organ dysfunction present [facility]) [A41.9] Patient Active Problem List   Diagnosis Date Noted   Acute encephalopathy 06/28/2024   Acute cystitis without hematuria 06/28/2024   Type 2 diabetes mellitus with other specified complication ([name]) 06/28/2024   Perforation of sigmoid colon due to diverticulitis 10/28/2022   Abscess of sigmoid colon due to diverticulitis 10/28/2022   Alcohol  use disorder 10/28/2022   Acute diverticulitis 10/27/2022   Hypokalemia 10/27/2022   Type 2 diabetes mellitus without complication, without long-term current use of insulin  ([name]) 10/28/2021   Anxiety disorder, unspecified 05/01/2021   Diarrhea 05/01/2021   Elevation of levels of liver transaminase levels 05/01/2021   Encounter for fitting and adjustment of hearing aid 05/01/2021   Epigastric pain 05/01/2021   Family history of malignant neoplasm of gastrointestinal tract 05/01/2021  Fatigue 05/01/2021   Flatulence, eructation and gas pain 05/01/2021   Glaucomatous optic atrophy, 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   Hypertensive heart disease without heart failure 05/01/2021   Other disorders of optic disc, 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   Other long term (current) drug therapy 05/01/2021   Pain in right shoulder 05/01/2021   Preglaucoma, unspecified, 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   Rectal bleeding 05/01/2021   Right  lower quadrant pain 05/01/2021   Sensorineural hearing loss, 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   Systemic lupus erythematosus ([name]) 05/01/2021   Aortic atherosclerosis ([name]) 01/06/2021   History of colonic polyps 12/25/2020   DOE (dyspnea on exertion) 10/11/2020   COPD GOLD 0  10/11/2020   S/P coronary artery stent placement 07/20/2020   Hypertriglyceridemia 07/20/2020   Pure hypercholesterolemia 07/20/2020   SLE (systemic lupus erythematosus related syndrome) ([name]) 08/15/2019   Nausea without vomiting 08/04/2018   Excessive sweating 08/04/2018   Systemic involvement of connective tissue ([name]) 06/06/2018   Fibromyalgia 04/13/2018   History of total knee replacement, bilateral 04/13/2018   DDD (degenerative disc disease), lumbar 04/13/2018   High risk medication use 03/06/2017   Paroxysmal atrial fibrillation ([name]) 02/19/2016   Chronic pain syndrome 02/12/2015   Primary osteoarthritis of both hands    Esophageal reflux    Autoimmune disease ([name])    Hearing loss    Essential hypertension, benign 11/01/2012   Other abnormal glucose 11/01/2012   Depression 11/01/2012   Insomnia 11/01/2012   PCP:  Claudene Rayfield HERO, MD Pharmacy:   [id number] [place], [place] - [address] [place] [address] Phone: [phone] Fax: [phone]  [facility] [place], [place] - [address] [place] [address] Phone: [phone] Fax: [phone]  [facility] [address] [place] [address] Phone: [phone] Fax: [phone]     Social Drivers of Health (SDOH) Social History: SDOH Screenings   Food Insecurity: No Food Insecurity (06/28/2024)  Housing: Low Risk  (06/28/2024)  Transportation Needs: No Transportation Needs (06/28/2024)  Utilities: Not At Risk (06/28/2024)  Alcohol  Screen: Low Risk  (09/12/2021)  Financial Resource Strain: Low Risk  (09/12/2021)   Physical Activity: Inactive (09/12/2021)  Social Connections: Moderately Isolated (03/25/2021)   Received from [facility]  Stress: No Stress Concern Present (03/14/2020)   Received from [facility]  Tobacco Use: Medium Risk (06/28/2024)   SDOH Interventions:     Readmission Risk Interventions    10/28/2022    9:00 AM  Readmission Risk Prevention Plan  Transportation Screening Complete  PCP or Specialist Appt within 5-7 Days Complete  Home Care Screening Complete  Medication Review (RN CM) Complete

## 2024-06-30 ENCOUNTER — Other Ambulatory Visit (HOSPITAL_COMMUNITY): Payer: Self-pay

## 2024-06-30 DIAGNOSIS — A419 Sepsis, unspecified organism: Secondary | ICD-10-CM | POA: Diagnosis not present

## 2024-06-30 DIAGNOSIS — B9620 Unspecified Escherichia coli [E. coli] as the cause of diseases classified elsewhere: Secondary | ICD-10-CM

## 2024-06-30 DIAGNOSIS — G9340 Encephalopathy, unspecified: Secondary | ICD-10-CM | POA: Diagnosis not present

## 2024-06-30 LAB — CBC
HCT: 39.8 % (ref 39.0–52.0)
Hemoglobin: 13.2 g/dL (ref 13.0–17.0)
MCH: 27.8 pg (ref 26.0–34.0)
MCHC: 33.2 g/dL (ref 30.0–36.0)
MCV: 84 fL (ref 80.0–100.0)
Platelets: 160 K/uL (ref 150–400)
RBC: 4.74 MIL/uL (ref 4.22–5.81)
RDW: 15.3 % (ref 11.5–15.5)
WBC: 8.1 K/uL (ref 4.0–10.5)
nRBC: 0 % (ref 0.0–0.2)

## 2024-06-30 LAB — URINE CULTURE: Culture: >=100000 — AB

## 2024-06-30 LAB — GLUCOSE, CAPILLARY: Glucose-Capillary: 136 mg/dL — ABNORMAL HIGH (ref 70–99)

## 2024-06-30 MED ORDER — CEFADROXIL 500 MG PO CAPS
1.0000 g | ORAL_CAPSULE | Freq: Two times a day (BID) | ORAL | 0 refills | Status: AC
  Filled 2024-06-30: qty 16, 4d supply, fill #0

## 2024-06-30 NOTE — Progress Notes (Signed)
 Occupational Therapy Treatment Patient Details Name: David Weaver MRN: [id number] DOB: 01/04/1951 Today's Date: 06/30/2024   History of present illness 73 y.o. male presents to [facility] 06/28/24 with AMS, urinary frequency and difficulty ambulating. Code stroke called, canceled by neurology. Admitted with acute encephalopathy 2/2 sepsis in setting of UTI. PMHx: HTN, T2DM, SLE, CAD s/p LAD PCI, P A-fib on Xarelto , DDD on chronic opioid use   OT comments  Pt continuing to demonstrated limited higher level processing/cognitive ability. Worked with pt on expanding frame of thinking, using categorization and grouping strategies needing moderate cues to further his frame of thinking. Pt did note that he would do better at categorizing more familiar things such as items at [facility] [employer]. OT to continue to progress pt as able. DC plans remain appropriate.       If plan is discharge home, recommend the following:  Assistance with cooking/housework;Direct supervision/assist for medications management;Direct supervision/assist for financial management   Equipment Recommendations  None recommended by OT    Recommendations for Other Services      Precautions / Restrictions Precautions Precautions: Fall Restrictions Weight Bearing Restrictions Per Provider Order: No       Mobility Bed Mobility Overal bed mobility: Modified Independent                  Transfers Overall transfer level: Modified independent Equipment used: None           Balance Overall balance assessment: History of Falls, Mild deficits observed, not formally tested         ADL either performed or assessed with clinical judgement   ADL         Functional mobility during ADLs: Supervision/safety General ADL Comments: Pt labeling objects around room with green stickers, then worked on grouping/classification    Extremity/Trunk Assessment     Lower Extremity Assessment Lower Extremity Assessment:  Overall WFL for tasks assessed   Cervical / Trunk Assessment Cervical / Trunk Assessment: Normal             Communication Communication Communication: Impaired Factors Affecting Communication: Hearing impaired (hearing aides)   Cognition   Behavior During Therapy: WFL for tasks assessed/performed Cognition: Cognition impaired       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 pt able to create/identify similarities between 2-3 objects max, breaking them down into wearables and hygiene. Cues for pt to expand categories and thought process to additional items such as grouping by color or technology. Or even demonstrate pt additional items that can be included into groups that he was already created. Cues to determine other groups such as utensils, color blue.                 Following commands: Intact        Cueing   Cueing Techniques: Verbal cues             Pertinent Vitals/ Pain       Pain Assessment Pain Assessment: No/denies pain   Frequency  Min 2X/week        Progress Toward Goals  OT Goals(current goals can now be found in the care plan section)  Progress towards OT goals: Progressing toward goals  Acute Rehab OT Goals Patient Stated Goal: To go home OT Goal Formulation: With patient Time For Goal Achievement: 07/14/24 Potential to Achieve Goals: Good  Plan  AM-PAC OT 6 Clicks Daily Activity     Outcome Measure   Help from another person eating meals?: None Help from another person taking care of personal grooming?: A Little Help from another person toileting, which includes using toliet, bedpan, or urinal?: A Little Help from another person bathing (including washing, rinsing, drying)?: A Little Help from another person to put on and taking off regular upper body  clothing?: None Help from another person to put on and taking off regular lower body clothing?: A Little 6 Click Score: 20    End of Session    OT Visit Diagnosis: Other symptoms and signs involving cognitive function   Activity Tolerance Patient tolerated treatment well   Patient Left in bed;with call bell/phone within reach   Nurse Communication Mobility status        Time: 9097-9081 OT Time Calculation (min): 16 min  Charges: OT General Charges $OT Visit: 1 Visit OT Treatments $Therapeutic Activity: 8-22 mins  06/30/2024  AB, OTR/L  Acute Rehabilitation Services  Office: [phone]   Curtistine JONETTA Das 06/30/2024, 9:59 AM

## 2024-06-30 NOTE — TOC Transition Note (Signed)
 Transition of Care [facility]) - Discharge Note   Patient Details  Name: David Weaver MRN: [id number] Date of Birth: 1951/01/31  Transition of Care [facility]) CM/SW Contact:  Andrez JULIANNA George, RN Phone Number: 06/30/2024, 10:46 AM   Clinical Narrative:     Pt is discharging home with home health therapy through [facility]. Information on the AVS. Hedda will contact him for the first home visit.  Pt has transportation home.   Final next level of care: Home w Home Health Services Barriers to Discharge: No Barriers Identified   Patient Goals and CMS Choice   CMS Medicare.gov Compare Post Acute Care list provided to:: Patient Represenative (must comment) Choice offered to / list presented to : Spouse      Discharge Placement                       Discharge Plan and Services Additional resources added to the After Visit Summary for     Discharge Planning Services: CM Consult Post Acute Care Choice: Home Health                    HH Arranged: PT, [facility] Agency: [facility] Date [facility] Agency Contacted: 06/29/24   Representative spoke with at [facility] Agency: Darleene  Social Drivers of Health (SDOH) Interventions SDOH Screenings   Food Insecurity: No Food Insecurity (06/28/2024)  Housing: Low Risk  (06/28/2024)  Transportation Needs: No Transportation Needs (06/28/2024)  Utilities: Not At Risk (06/28/2024)  Alcohol  Screen: Low Risk  (09/12/2021)  Financial Resource Strain: Low Risk  (09/12/2021)  Physical Activity: Inactive (09/12/2021)  Social Connections: Unknown (06/29/2024)  Stress: No Stress Concern Present (03/14/2020)   Received from [facility]  Tobacco Use: Medium Risk (06/28/2024)     Readmission Risk Interventions    10/28/2022    9:00 AM  Readmission Risk Prevention Plan  Transportation Screening Complete  PCP or Specialist Appt within 5-7 Days Complete  Home Care Screening Complete  Medication Review (RN CM) Complete

## 2024-06-30 NOTE — Discharge Instructions (Addendum)
 Thank you for allowing us  to be part of your care. You were hospitalized for altered mental status and a urinary tract infection. We treated you with IV antibiotics and pain medicine.   See the changes in your medications below:  *For your urinary tract infection -We have STARTED you on these following medications:  -Cefadroxil  1000 mg (2 tablets) twice a day, for four days    FOLLOW UP APPOINTMENTS: Please see your PCP in 7 to 10 days. You should also be contacted to arrange home health physical therapy.   Please make sure to take your at home medications. Additionally, take 2 tablets of the antibiotic, twice a day, for four days. You will start the oral antibiotic on 8/1 and finish 8/4.   Please call your PCP or our clinic if you have any questions or concerns, we may be able to help and keep you from a long and expensive emergency room wait. Our clinic and after hours phone number is [phone]. The best time to call is Monday through Friday 9 am to 4 pm but there is always someone available 24/7 if you have an emergency. If you need medication refills please notify your pharmacy one week in advance and they will send us  a request.   We are glad you are feeling better,  Viktoria King Internal Medicine Inpatient Teaching Service at [facility]

## 2024-06-30 NOTE — Discharge Summary (Signed)
 Name: David Weaver MRN: [id number] DOB: 04/20/1951 73 y.o. PCP: Claudene Rayfield HERO, MD  Date of Admission: 06/28/2024  4:42 AM Date of Discharge: 06/30/24 Attending Physician: Dr. Reyes Fenton  Discharge Diagnosis: 1. Principal Problem:   E. coli UTI (urinary tract infection) Active Problems:   Essential hypertension, benign   Hearing loss   Chronic pain syndrome   Paroxysmal atrial fibrillation ([name])   Systemic lupus erythematosus ([name])   Acute encephalopathy   Acute cystitis without hematuria   Type 2 diabetes mellitus with other specified complication ([name])   Sepsis ([name])   Discharge Medications: Allergies as of 06/30/2024   No Known Allergies      Medication List     STOP taking these medications    predniSONE  20 MG tablet Commonly known as: DELTASONE        TAKE these medications    Adalimumab-bwwd 40 MG/0.4ML Soaj Inject 40 mg into the skin every 14 (fourteen) days.   alendronate 70 MG tablet Commonly known as: FOSAMAX Take 70 mg by mouth once a week. Monday   amLODipine  5 MG tablet Commonly known as: NORVASC  Take 5 mg by mouth daily.   atorvastatin  40 MG tablet Commonly known as: LIPITOR Take 1 tablet (40 mg total) by mouth daily.   buPROPion  150 MG 24 hr tablet Commonly known as: WELLBUTRIN  XL Take 150 mg by mouth daily.   carboxymethylcellulose 0.5 % Soln Commonly known as: REFRESH PLUS Place 1 drop into both eyes 3 (three) times daily as needed (dry eyes).   cefadroxil  500 MG capsule Commonly known as: DURICEF Take 2 capsules (1,000 mg total) by mouth 2 (two) times daily for 4 days. Start taking on: July 01, 2024   colestipol  1 g tablet Commonly known as: COLESTID  Take 1 g by mouth 2 (two) times daily.   Dialyvite Vitamin D  5000 125 MCG (5000 UT) capsule Generic drug: Cholecalciferol Take 5,000 Units by mouth daily.   DULoxetine  60 MG capsule Commonly known as: CYMBALTA  Take 1 capsule (60 mg total) 2 (two) times daily by  mouth. What changed: when to take this   ezetimibe  10 MG tablet Commonly known as: ZETIA  Take 10 mg by mouth daily.   famotidine  20 MG tablet Commonly known as: PEPCID  Take 20 mg by mouth daily.   folic acid  1 MG tablet Commonly known as: FOLVITE  Take 1 mg by mouth daily.   hydroxychloroquine  200 MG tablet Commonly known as: PLAQUENIL  TAKE 1 TABLET BY MOUTH TWICE A DAY   losartan  100 MG tablet Commonly known as: COZAAR  Take 1 tablet (100 mg total) by mouth daily.   melatonin 5 MG Tabs Take 5 mg by mouth at bedtime as needed (sleep).   metFORMIN 500 MG tablet Commonly known as: GLUCOPHAGE Take 500 mg by mouth 2 (two) times daily with a meal.   methocarbamol  500 MG tablet Commonly known as: ROBAXIN  Take 1 tablet (500 mg total) by mouth every 8 (eight) hours as needed for muscle spasms.   metoprolol  tartrate 50 MG tablet Commonly known as: LOPRESSOR  Take 50 mg by mouth 2 (two) times daily.   mirtazapine  30 MG tablet Commonly known as: REMERON  Take 30 mg by mouth at bedtime.   mycophenolate 500 MG tablet Commonly known as: CELLCEPT Take 1,000 mg by mouth 2 (two) times daily.   oxyCODONE -acetaminophen  7.5-325 MG tablet Commonly known as: PERCOCET Take 1 tablet by mouth every 6 (six) hours as needed for severe pain (pain score 7-10) or moderate pain (pain  score 4-6).   pantoprazole  40 MG tablet Commonly known as: PROTONIX  TAKE 1 TABLET BY MOUTH DAILY   rivaroxaban  20 MG Tabs tablet Commonly known as: Xarelto  Take 1 tablet (20 mg total) by mouth daily with supper.   Salonpas 3.12-06-08 % Ptch Generic drug: Camphor-Menthol -Methyl Sal Place 1 patch onto the skin daily as needed (pain).        Disposition and follow-up:   David Weaver was discharged from [facility] in Stable condition.  At the hospital follow up visit please address:  1.   -Altered mental status likely 2/2 UTI: Patient completed 3 days of IV ceftriaxone  in hospital.  Please ensure completion of four additional days outpatient, 1000 mg cefadroxil  BID, EOT 8/4. Please assess cognition for return to baseline and resolution of UTI symptoms.    2.  Labs / imaging needed at time of follow-up: none  3.  Pending labs/ test needing follow-up: Blood cultures from admission 7/29 pending results, currently no growth.   Follow-up Appointments:  Follow-up Information     [facility] Follow up.   Specialty: Home Health Services Why: Hedda will contact you for the first home visit Contact information: [address] [place] [address] [phone]         Claudene Rayfield HERO, MD. Schedule an appointment as soon as possible for a visit.   Specialty: Family Medicine Contact information: [address] [place] [address] [phone]                  Hospital Course by problem list: David Weaver is a 73 y.o. person living with a history of A-fib on Xarelto , hypertension, cutaneous lupus, T2DM, DDD on chronic opioid use, depression who presented with AMS and admitted for acute encephalopathy c/f sepsis in setting of UTI now being discharged on 7/31.    #Acute encephalopathy  #UTI -Presents 7/29 AM with AMS overnight, ANO x 2 to self and place.  Last well-known was about 9:30 PM 7/28 per wife after dinner and going to bed.  No acute concerns on 7/28 and was feeling well at the time of him going to bed. Patient had reported dysuria and wife noticed an odor from urine for about a week before admission. EEG ordered given concern of seizure based on wife's report of him standing at sink overnight confused. EEG on 7/29 suggestive of mild to mod diffuse encephalopathy likely assoc with sedation, toxic-metabolic etiology. Neg for seizures. CXR questionable for PNA but no acute respiratory symptoms in ED or while on the floor. CTA in ED negative for PE, CT Head negative for acute infarct or hemorrhage. RVP negative.  On admission,  electrolytes stable, normal renal and hepatic function. Noted leukocytosis of 13.5 and reported temp of 100.4. UA suggestive of UTI, cultures grew E. Coli with susceptibilities for tailored antibiotics. Blood cultures taken on admission have not grown anything, final results still pending. While in the hospital, patient was started on IV ceftriaxone  and tolerated well. WBC continued to trend down, 8.1 on the day of discharge. Patient has been afebrile since admission although has been taking tylenol  for headache and chronic degenerative disc pain. Patient was alert to self, place, and time on 7/30 and 7/31. He reports feeling better and more like himself. His wife supports that his mentation is much better than it was on admission.  Patient completed three days of IV abx with discharge instructions to complete abx course of cefadroxil  1000  mg, twice a day, for four additional days, EOT 8/4. This will complete a 7 day course of abx therapy.  At home PT with Hedda will see the patient to increase strength and endurance.   #Lactic acidosis  2.2 on admission, given NS bolus in ED. Lactic acid repeat 3 hours later 1.4 wnl.    #pAfib NSR on admission. Continued home Xarelto  20 mg and home metoprolol  50 mg twice daily.   #SLE Follows with dermatology.  PTA on mycophenolate 1000 mg twice daily and hydroxychloroquine  200 mg daily. Held mycophenolate and prednisone  in setting of infection. Resumed mycophenolate on discharge.  Prednisone  temporarily started 7/8 by derm for SLE flare, this is not a regular at home medication. Did not resume prednisone  on discharge.    #HTN Continued at home BP meds in hospital on 7/30. Amlodipine  5 mg, losartan  100 mg daily. BP was mildly elevated while admitted, 145/60 overnight on 7/30.    #T2DM A1c 7.0 in June 2025.  PTA on metformin. Sugars in the 140s while admitted. Recent prednisone  use, unclear when patient stopped prednisone .    #DDD on chronic opioid use   Chronic/stable.  PTA on Percocet 7.5-325 mg every 6 hours as needed. Percocet held on admission but restarted on the floor for pain.    #Depression #Insomnia PTA on Wellbutrin  150 mg daily, duloxetine  60 mg daily, mirtazapine  30 mg nightly. Continued Wellbutrin  and mirtazapine  in the hospital. Held duloxetine  given AMS but will restart on discharge.     #CAD s/p PCI #HLD Chronic/stable. Continue home atorvastatin  40 mg, Zetia  10 mg and colestipol  1 g BID.  Subjective Feeling well today and slept better last night now that the bed railing is down and he can get in and out of the bed. He is able to ambulate to the bathroom alone. His headache that started yesterday afternoon is still there but the nurses just gave him more pain medicine. He tolerated his breakfast well. Denies dysuria. He has a persistent cough but denies sputum production. Denies abdominal and back pain. He has chronic neck pain, however.  Yesterday, sometime after 3pm, he had an episode where his heart was fluttering and it was associated with left arm pain. The fluttering and pain resolved and has not returned. He denies dizziness or tingling in his fingers during the episode.  We discussed that, because of his improved mentation and down trending infection cells, we are okay to send him home with oral antibiotics for him to take for four days starting tomorrow 8/1. We recommended he discuss his urinary hesitancy with his PCP as this may have put him at risk for UTI. Also discussed following up with dermatology about the medications he is on for SLE. Also, patient mentioned some itchy skin lesions on his knees that he puts ointment on.   Discharge Exam:   BP (!) 113/96 (BP Location: Right Arm)   Pulse 86   Temp 98.3 F (36.8 C) (Oral)   Resp 20   Ht 5' 7 (1.702 m)   Wt 94.6 kg   SpO2 92%   BMI 32.66 kg/m  Discharge exam:  Physical Exam Constitutional:      Appearance: He is not ill-appearing, toxic-appearing or  diaphoretic.  HENT:     Nose: No congestion or rhinorrhea.     Mouth/Throat:     Mouth: Mucous membranes are moist.     Pharynx: Oropharynx is clear.  Eyes:     Extraocular Movements: Extraocular movements intact.  Conjunctiva/sclera: Conjunctivae normal.  Cardiovascular:     Rate and Rhythm: Normal rate and regular rhythm.  Pulmonary:     Effort: Pulmonary effort is normal.     Breath sounds: Normal breath sounds.  Abdominal:     Palpations: Abdomen is soft. There is no mass.     Tenderness: There is no abdominal tenderness. There is no guarding.  Musculoskeletal:        General: No swelling.     Right lower leg: No edema.     Left lower leg: No edema.  Skin:    General: Skin is warm and dry.     Coloration: Skin is not pale.     Comments: Hyperkeratotic plaque bilateral anterior knees, not erythematous, possibly resolving psoriatic plaque  Neurological:     General: No focal deficit present.     Mental Status: He is alert and oriented to person, place, and time.  Psychiatric:        Mood and Affect: Mood normal.   Pertinent Labs, Studies, and Procedures:     Latest Ref Rng & Units 06/30/2024    4:32 AM 06/29/2024    3:53 AM 06/28/2024    4:46 AM  CBC  WBC 4.0 - 10.5 K/uL 8.1  13.0  13.6   Hemoglobin 13.0 - 17.0 g/dL 86.7  86.7  87.5   Hematocrit 39.0 - 52.0 % 39.8  40.9  38.8   Platelets 150 - 400 K/uL 160  174  170        Latest Ref Rng & Units 06/29/2024    3:53 AM 06/28/2024    4:46 AM 06/28/2024    4:40 AM  CMP  Glucose 70 - 99 mg/dL 840  854  851   BUN 8 - 23 mg/dL 6  14  14    Creatinine 0.61 - 1.24 mg/dL 9.40  9.24  9.29   Sodium 135 - 145 mmol/L 137  137  136   Potassium 3.5 - 5.1 mmol/L 3.8  3.7  3.7   Chloride 98 - 111 mmol/L 102  103  102   CO2 22 - 32 mmol/L 24  21    Calcium  8.9 - 10.3 mg/dL 8.8  8.4    Total Protein 6.5 - 8.1 g/dL  5.9    Total Bilirubin 0.0 - 1.2 mg/dL  0.7    Alkaline Phos 38 - 126 U/L  45    AST 15 - 41 U/L  19    ALT 0 -  44 U/L  23      EEG adult Result Date: 06/28/2024 Gregg Lek, MD     06/28/2024 12:11 PM Patient Name: David Weaver MRN: [id number] Epilepsy Attending: Lek Gregg Referring Physician/Provider: Date: 06/28/2024 Duration:  27 minutes Patient history: 73 y.o. male with altered mental status of unclear etiology.  EEG to rule out seizure Level of alertness: Awake, drowsy, AEDs during EEG study: Technical aspects: This EEG study was done with scalp electrodes positioned according to the 10-20 International system of electrode placement. Electrical activity was reviewed with band pass filter of 1-70Hz , sensitivity of 7 uV/mm, display speed of 40mm/sec with a 60Hz  notched filter applied as appropriate. EEG data were recorded continuously and digitally stored.  Video monitoring was available and reviewed as appropriate. Description: The posterior dominant rhythm consists of 3-7 Hz activity of moderate voltage (25-35 uV) seen predominantly in posterior head regions, symmetric and reactive to eye opening and eye closing. Drowsiness was characterized by attenuation of the posterior  background rhythm. Sleep was not seen. EEG showed continuous generalized polymorphic sharply contoured 3 to 6 Hz theta-delta slowing. Physiologic photic driving was not seen during photic stimulation.  Hyperventilation was not performed.   ABNORMALITY - Continuous slow, generalized IMPRESSION: This study is suggestive of mild to moderate diffuse encephalopathy, nonspecific etiology but likely related to sedation, toxic-metabolic etiology. No seizures or epileptiform discharges were seen throughout the recording. Pastor Falling MD Neurology    DG Chest Portable 1 View Result Date: 06/28/2024 EXAM: 1 VIEW XRAY OF THE CHEST 06/28/2024 05:21:46 AM COMPARISON: 10/27/2022 CLINICAL HISTORY: Eval for pneumonia. Patient was disoriented and was found standing in front of the bathroom sink. Patient was found confused and had urinated on himself. When  EMS arrived patient had complaints of right arm and leg weakness, right side facial droop and aphasia. FINDINGS: LUNGS AND PLEURA: Low lung volumes Opacity within the left lung base compatible with atelectasis or pneumonia. No pulmonary edema. No pleural effusion. No pneumothorax. HEART AND MEDIASTINUM: No acute abnormality of the cardiac and mediastinal silhouettes. BONES AND SOFT TISSUES: Signs of previous left shoulder arthroplasty. No acute osseous abnormality. IMPRESSION: 1. Low lung volumes 2. Opacity within the left lung base compatible with atelectasis or pneumonia. Electronically signed by: Waddell Calk MD 06/28/2024 05:44 AM EDT RP Workstation: [id number]   CT ANGIO HEAD NECK W WO CM W PERF (CODE STROKE) Result Date: 06/28/2024 CLINICAL DATA:  Code stroke.  73 year old male.  Right side deficit. EXAM: CT ANGIOGRAPHY HEAD AND NECK CT PERFUSION BRAIN TECHNIQUE: Multidetector CT imaging of the head and neck was performed using the standard protocol during bolus administration of intravenous contrast. Multiplanar CT image reconstructions and MIPs were obtained to evaluate the vascular anatomy. Carotid stenosis measurements (when applicable) are obtained utilizing NASCET criteria, using the distal internal carotid diameter as the denominator. Multiphase CT imaging of the brain was performed following IV bolus contrast injection. Subsequent parametric perfusion maps were calculated using RAPID software. RADIATION DOSE REDUCTION: This exam was performed according to the departmental dose-optimization program which includes automated exposure control, adjustment of the mA and/or kV according to patient size and/or use of iterative reconstruction technique. CONTRAST:  100 mL Omnipaque  350 COMPARISON:  Plain head CT today 0445 hours. Chest CT 06/16/2023. FINDINGS: CT Brain Perfusion Findings: ASPECTS: 10 CBF (<30%) Volume: 0mL Perfusion (Tmax>6.0s) volume: 0mL Mismatch Volume: Not applicable Infarction  Location:Not applicable CTA NECK Skeleton: Largely absent dentition. Cervical spine disc and endplate degeneration. Chronic T5 compression fracture is stable from last year. No acute osseous abnormality identified. Upper chest: Stable visible upper lungs and no superior mediastinal lymphadenopathy. Other neck: Nonvascular neck soft tissue spaces are within normal limits. Aortic arch: 4 vessel arch, left vertebral artery arises directly from the arch. Calcified aortic atherosclerosis. And distal arch 2 cm area of bulky adherent soft plaque or thrombus (series 5, image 163 and series 8, image 116). Right carotid system: Brachiocephalic artery and right CCA origin tortuosity with minor plaque and no stenosis. Calcified plaque at the right ICA origin results in up to 50 % stenosis with respect to the distal vessel. Tortuous right ICA distal to the bulb and no additional stenosis to the skull base. Left carotid system: Left CCA origin plaque and tortuosity without stenosis. Calcified plaque at the left ICA origin and bulb, less than 50 % stenosis with respect to the distal vessel. Vertebral arteries: Tortuous proximal right subclavian artery. Calcified plaque near the right vertebral artery origin, but no convincing vertebral origin stenosis.  Mildly tortuous right V1 segment. Right vertebral artery mildly dominant and patent to the skull base with no significant stenosis. Left vertebral artery arises directly from the arch with mild tortuosity. Left vertebral artery is mildly non dominant to the skull base, patent with mild plaque and no stenosis. CTA HEAD Posterior circulation: Patent 10 tortuous distal vertebral arteries, mildly dominant right V4. Patent vertebrobasilar junction without stenosis. Bilateral AICA appear dominant and patent. Patent basilar artery with some calcified plaque but no significant basilar stenosis. Mildly ectatic basilar tip, SCA and PCA origins without stenosis. Small or absent posterior  communicating arteries. Bilateral PCA branches are patent with mild irregularity, primarily in the P3 segments. Anterior circulation: Both ICA siphons are patent. Left siphon mild ectasia and moderate calcified plaque. Only mild left siphon stenosis results. Similar right siphon ectasia, moderate calcified plaque with only mild right siphon stenosis. Patent carotid termini. Normal MCA and ACA origins. Diminutive or absent anterior communicating artery. Bilateral ACA branches are within normal limits. Right MCA M1 segment bifurcates early without stenosis. Right MCA branches are within normal limits. Left MCA M1 segment and bifurcation are patent without stenosis. Areas of questionable left MCA hyperdensity on plain CT remain patent and enhancing. Left MCA branches are within normal limits. Venous sinuses: Early contrast timing, grossly patent. Anatomic variants: Left vertebral artery arises directly from the aortic arch and is mildly non dominant. Review of the MIP images confirms the above findings IMPRESSION: 1. Negative CT Perfusion. CTA is negative for large vessel occlusion. 2. Atherosclerosis in the head and neck, including a bulky 2 cm area of adherent soft plaque or thrombus in the distal Aortic Arch. Bilateral carotid bifurcation and bilateral ICA siphon calcified atherosclerosis but no hemodynamically significant arterial stenosis identified. Aortic Atherosclerosis (ICD10-I70.0). Electronically Signed   By: VEAR Hurst M.D.   On: 06/28/2024 04:59   CT HEAD CODE STROKE WO CONTRAST Result Date: 06/28/2024 CLINICAL DATA:  Code stroke.  73 year old male.  Right side deficit. EXAM: CT HEAD WITHOUT CONTRAST TECHNIQUE: Contiguous axial images were obtained from the base of the skull through the vertex without intravenous contrast. RADIATION DOSE REDUCTION: This exam was performed according to the departmental dose-optimization program which includes automated exposure control, adjustment of the mA and/or kV  according to patient size and/or use of iterative reconstruction technique. COMPARISON:  Brain MRI 12/26/2005. FINDINGS: Brain: Cerebral volume is within normal limits for age. No midline shift, ventriculomegaly, mass effect, evidence of mass lesion, intracranial hemorrhage or evidence of cortically based acute infarction. Patchy and confluent bilateral cerebral white matter hypodensity, progressed from MRI findings in 2007. Punctate right basal ganglia vascular calcification. Heterogeneity in the deep gray nuclei, most notably the left thalamus. No cortical encephalomalacia identified. Vascular: Calcified atherosclerosis at the skull base. Questionable abnormal hyperdensity of the left MCA sagittal images 43-46. Skull: Mild motion artifact. Intact. No acute osseous abnormality identified. Sinuses/Orbits: Visualized paranasal sinuses and mastoids are clear. Other: No gaze deviation. Postoperative changes to the right globe. Visualized scalp soft tissues are within normal limits. ASPECTS [facility] Stroke Program Early CT Score) Total score (0-10 with 10 being normal): 10 IMPRESSION: 1. Suspicious asymmetric hyperdensity of the left MCA, but no cortically based acute infarct or intracranial hemorrhage identified. ASPECTS 10. 2. Advanced changes of small vessel disease in the cerebral white matter, and deep gray matter heterogeneity including in the left thalamus. 3. These results were communicated to Dr. Michaela at 4:48 am on 06/28/2024 by text page via the [facility] messaging system. Electronically  Signed   By: VEAR Hurst M.D.   On: 06/28/2024 04:50     Discharge Instructions: Discharge Instructions     Diet - low sodium heart healthy   Complete by: As directed    Discharge instructions   Complete by: As directed    Thank you for allowing us  to be part of your care. You were hospitalized for altered mental status and a urinary tract infection. We treated you with IV antibiotics and pain medicine.   See the  changes in your medications below:  *For your urinary tract infection -We have STARTED you on these following medications:  -Cefadroxil  1000 mg (2 tablets) twice a day, for four days    FOLLOW UP APPOINTMENTS: Please see your PCP in 7 to 10 days. You should also be contacted to arrange home health physical therapy.   Please make sure to take your at home medications. Additionally, take 2 tablets of the antibiotic, twice a day, for four days. You will start the oral antibiotic on 8/1 and finish 8/4.   Please call your PCP or our clinic if you have any questions or concerns, we may be able to help and keep you from a long and expensive emergency room wait. Our clinic and after hours phone number is [phone]. The best time to call is Monday through Friday 9 am to 4 pm but there is always someone available 24/7 if you have an emergency. If you need medication refills please notify your pharmacy one week in advance and they will send us  a request.   We are glad you are feeling better,  Viktoria King Internal Medicine Inpatient Teaching Service at [facility]   Increase activity slowly   Complete by: As directed        Signed: King Viktoria, DO 06/30/2024, 10:07 AM

## 2024-07-03 LAB — CULTURE, BLOOD (ROUTINE X 2)
Culture: NO GROWTH
Culture: NO GROWTH
Special Requests: ADEQUATE
Special Requests: ADEQUATE

## 2024-07-29 ENCOUNTER — Ambulatory Visit: Admitting: Podiatry

## 2024-07-29 DIAGNOSIS — M79672 Pain in left foot: Secondary | ICD-10-CM

## 2024-07-29 DIAGNOSIS — M79671 Pain in right foot: Secondary | ICD-10-CM | POA: Diagnosis not present

## 2024-07-29 DIAGNOSIS — B351 Tinea unguium: Secondary | ICD-10-CM | POA: Diagnosis not present

## 2024-07-29 MED ORDER — TERBINAFINE HCL 250 MG PO TABS: 250.0000 mg | ORAL_TABLET | Freq: Every day | ORAL | 1 refills | Status: AC

## 2024-07-29 NOTE — Progress Notes (Signed)
   Subjective:    HPI Presents for follow-up onychomycosis treatment with p.o. Lamisil .  .  Tenderness around toes with walking and wearing shoes.   Objective:  Physical Exam   General: AAO x3, NAD  Vascular: DP and PT pulses palpable bilaterally.  Immedate capillary fill time digits. No significant lower extremity edema bilaterally.  Dermatological: Onychomycotic mycotic changes nails 1 through 5 with discoloration nail and subungual debris and thickening of the nail.  50% clearance of hallux nails bilaterally and lesser toes 1 through 5 right.  Toenails on 2 through 5 left show only about 10% improvement.  Clearance of onychomycotic nail changes noted. Tenderness with walking and wearing shoes.  Neruologic: Grossly intact B/L  Musculoskeletal:   Assessment:  Painful onychomycotic nails 1 through 5 bilaterally. Pain feet b/l     Plan:  -Established office visit level 3 for evaluation and management - Given response to the Lamisil  mostly on the left foot we will try another 54-month round of Lamisil  to see if we can get better clearance of the onychomycotic changes on the left lesser toes. -Rx: Lamisil  250 mg p.o. daily, 1 refill - Labs ordered today for liver function tests to monitor for any hepatic side effects from the Lamisil .  - Return in 6 weeks for  Lamisil  3

## 2024-08-24 ENCOUNTER — Other Ambulatory Visit: Payer: Self-pay | Admitting: Acute Care

## 2024-08-24 DIAGNOSIS — Z122 Encounter for screening for malignant neoplasm of respiratory organs: Secondary | ICD-10-CM

## 2024-08-24 DIAGNOSIS — Z87891 Personal history of nicotine dependence: Secondary | ICD-10-CM

## 2024-09-09 ENCOUNTER — Other Ambulatory Visit: Payer: Self-pay | Admitting: Podiatry

## 2024-09-09 ENCOUNTER — Ambulatory Visit: Admitting: Podiatry

## 2024-09-09 VITALS — Ht 67.0 in | Wt 208.0 lb

## 2024-09-09 DIAGNOSIS — B351 Tinea unguium: Secondary | ICD-10-CM

## 2024-09-09 DIAGNOSIS — M79672 Pain in left foot: Secondary | ICD-10-CM

## 2024-09-09 DIAGNOSIS — M25572 Pain in left ankle and joints of left foot: Secondary | ICD-10-CM

## 2024-09-09 DIAGNOSIS — M79671 Pain in right foot: Secondary | ICD-10-CM | POA: Diagnosis not present

## 2024-09-09 MED ORDER — TERBINAFINE HCL 250 MG PO TABS: 250.0000 mg | ORAL_TABLET | Freq: Every day | ORAL | 0 refills | Status: AC

## 2024-09-09 NOTE — Progress Notes (Signed)
   Subjective:    HPI Presents for follow-up onychomycosis treatment with p.o. Lamisil .  No problems taking medicine with no side effects noted.  Tenderness around toes with walking and wearing shoes.  Most complains about pain over the first TMT of the left foot which have injected in the past he has got arthritic changes there.  Also complains of pain along the plantar lateral aspect of the foot on the right.  This [place].   Objective:  Physical Exam   General: AAO x3, NAD  Vascular: DP and PT pulses palpable bilaterally.  Immedate capillary fill time digits. No significant lower extremity edema bilaterally.  Dermatological: Onychomycotic mycotic changes nails 1 through 5 with discoloration nail and subungual debris and thickening of the nail. 5% Clearance of onychomycotic nail changes noted. Tenderness with walking and wearing shoes.  Neruologic: Grossly intact B/L  Musculoskeletal: Tenderness at first TMT with palpation and range of motion left.  Some tenderness along peroneus brevis and longus tendons on the left foot.  Normal muscle strength lower extremity bilaterally  Assessment:  Painful onychomycotic nails 1 through 5 bilaterally. Pain feet b/l 3.  Arthralgia TMT 1 left    Plan:  -Established office visit level 3 for evaluation and management -Discussed the pain at the TMT 1 left and probably the peroneal tendons on the right.  Do not take over-the-counter Aleve 2 p.o. twice daily for the next several weeks.  Hopefully this resolves the pain if not in his next visit we can inject. -Patient is tolerating Lamisil  treatment for onychomycotic nails well.  No side effects noted. Will continue this treatment. -Rx: Lamisil  250 mg p.o. daily - Labs ordered today for liver function tests to monitor for any hepatic side effects from the Lamisil .  - Return in 4 weeks for  Lamisil  final

## 2024-09-10 LAB — HEPATIC FUNCTION PANEL
ALT: 19 IU/L (ref 0–44)
AST: 15 IU/L (ref 0–40)
Albumin: 4.3 g/dL (ref 3.8–4.8)
Alkaline Phosphatase: 70 IU/L (ref 47–123)
Bilirubin Total: 0.3 mg/dL (ref 0.0–1.2)
Bilirubin, Direct: 0.11 mg/dL (ref 0.00–0.40)
Total Protein: 6.6 g/dL (ref 6.0–8.5)

## 2024-10-07 ENCOUNTER — Encounter: Payer: Self-pay | Admitting: Podiatry

## 2024-10-07 ENCOUNTER — Other Ambulatory Visit: Payer: Self-pay | Admitting: Podiatry

## 2024-10-07 ENCOUNTER — Ambulatory Visit: Admitting: Podiatry

## 2024-10-07 DIAGNOSIS — M79671 Pain in right foot: Secondary | ICD-10-CM | POA: Diagnosis not present

## 2024-10-07 DIAGNOSIS — B351 Tinea unguium: Secondary | ICD-10-CM | POA: Diagnosis not present

## 2024-10-07 DIAGNOSIS — M79672 Pain in left foot: Secondary | ICD-10-CM | POA: Diagnosis not present

## 2024-10-07 NOTE — Progress Notes (Signed)
   Subjective:    HPI Presents for follow-up onychomycosis treatment with p.o. Lamisil .  No problems taking medicine with no side effects noted.  Tenderness around toes with walking and wearing shoes.   Objective:  Physical Exam   General: AAO x3, NAD  Vascular: DP and PT pulses palpable bilaterally.  Immedate capillary fill time digits. No significant lower extremity edema bilaterally.  Dermatological: Onychomycotic mycotic changes nails 1 through 5 with discoloration nail and subungual debris and thickening of the nail. 5% Clearance of onychomycotic nail changes noted.  Is more noticeable on the right than on the left tenderness with walking and wearing shoes.  Neruologic: Grossly intact B/L  Musculoskeletal:   Assessment:  Painful onychomycotic nails 1 through 5 bilaterally. Pain feet b/l     Plan:  -Established office visit level 3 for evaluation and management -Patient is tolerating Lamisil  treatment for onychomycotic nails well.  No side effects noted. Will continue this treatment. -Labs ordered today for liver function tests to monitor for any hepatic side effects from the Lamisil . -Will probably take another 5 to 6 months for the nails to completely clear.  If not cleared by then, call for reassessment.  Return as needed

## 2024-10-08 LAB — HEPATIC FUNCTION PANEL
ALT: 15 IU/L (ref 0–44)
AST: 11 IU/L (ref 0–40)
Albumin: 4.4 g/dL (ref 3.8–4.8)
Alkaline Phosphatase: 55 IU/L (ref 47–123)
Bilirubin Total: 0.3 mg/dL (ref 0.0–1.2)
Bilirubin, Direct: 0.11 mg/dL (ref 0.00–0.40)
Total Protein: 6.3 g/dL (ref 6.0–8.5)

## 2024-11-01 ENCOUNTER — Other Ambulatory Visit: Payer: Self-pay | Admitting: Podiatry

## 2024-11-09 ENCOUNTER — Encounter (HOSPITAL_BASED_OUTPATIENT_CLINIC_OR_DEPARTMENT_OTHER): Payer: Self-pay | Admitting: Cardiovascular Disease

## 2025-03-07 ENCOUNTER — Ambulatory Visit (HOSPITAL_BASED_OUTPATIENT_CLINIC_OR_DEPARTMENT_OTHER): Admitting: Cardiovascular Disease

## 2025-04-07 ENCOUNTER — Ambulatory Visit: Admitting: Podiatry
